# Patient Record
Sex: Female | Born: 1945
Health system: Southern US, Community
[De-identification: ages and names within clinical notes are randomized; demographics above are authoritative.]

## PROBLEM LIST (undated history)

## (undated) DIAGNOSIS — D649 Anemia, unspecified: Secondary | ICD-10-CM

## (undated) DIAGNOSIS — I700 Atherosclerosis of aorta: Secondary | ICD-10-CM

## (undated) DIAGNOSIS — C801 Malignant (primary) neoplasm, unspecified: Secondary | ICD-10-CM

## (undated) DIAGNOSIS — E785 Hyperlipidemia, unspecified: Secondary | ICD-10-CM

## (undated) DIAGNOSIS — N183 Chronic kidney disease, stage 3 unspecified: Secondary | ICD-10-CM

## (undated) DIAGNOSIS — K219 Gastro-esophageal reflux disease without esophagitis: Secondary | ICD-10-CM

## (undated) DIAGNOSIS — N200 Calculus of kidney: Secondary | ICD-10-CM

## (undated) DIAGNOSIS — R768 Other specified abnormal immunological findings in serum: Secondary | ICD-10-CM

## (undated) DIAGNOSIS — N2581 Secondary hyperparathyroidism of renal origin: Secondary | ICD-10-CM

## (undated) DIAGNOSIS — T7840XA Allergy, unspecified, initial encounter: Secondary | ICD-10-CM

## (undated) DIAGNOSIS — D696 Thrombocytopenia, unspecified: Secondary | ICD-10-CM

## (undated) DIAGNOSIS — M51369 Other intervertebral disc degeneration, lumbar region without mention of lumbar back pain or lower extremity pain: Secondary | ICD-10-CM

## (undated) DIAGNOSIS — I10 Essential (primary) hypertension: Secondary | ICD-10-CM

## (undated) DIAGNOSIS — Z8719 Personal history of other diseases of the digestive system: Secondary | ICD-10-CM

## (undated) DIAGNOSIS — A048 Other specified bacterial intestinal infections: Secondary | ICD-10-CM

## (undated) DIAGNOSIS — M5136 Other intervertebral disc degeneration, lumbar region: Secondary | ICD-10-CM

## (undated) HISTORY — PX: APPENDECTOMY: SHX54

## (undated) HISTORY — DX: Allergy, unspecified, initial encounter: T78.40XA

## (undated) HISTORY — DX: Calculus of kidney: N20.0

## (undated) HISTORY — PX: ABDOMINAL HYSTERECTOMY: SHX81

---

## 2006-03-04 ENCOUNTER — Ambulatory Visit: Payer: Self-pay | Admitting: Family Medicine

## 2006-03-18 ENCOUNTER — Ambulatory Visit: Payer: Self-pay | Admitting: Family Medicine

## 2006-03-26 ENCOUNTER — Ambulatory Visit: Payer: Self-pay | Admitting: Gastroenterology

## 2006-04-09 HISTORY — PX: BREAST BIOPSY: SHX20

## 2006-08-09 ENCOUNTER — Ambulatory Visit: Payer: Self-pay | Admitting: General Surgery

## 2007-02-10 ENCOUNTER — Ambulatory Visit: Payer: Self-pay | Admitting: General Surgery

## 2008-05-04 ENCOUNTER — Ambulatory Visit: Payer: Self-pay | Admitting: Family Medicine

## 2009-05-10 ENCOUNTER — Ambulatory Visit: Payer: Self-pay | Admitting: Family Medicine

## 2010-02-15 ENCOUNTER — Ambulatory Visit: Payer: Self-pay | Admitting: Gastroenterology

## 2010-07-06 LAB — HM DEXA SCAN

## 2010-07-26 ENCOUNTER — Ambulatory Visit: Payer: Self-pay | Admitting: Family Medicine

## 2011-08-29 ENCOUNTER — Ambulatory Visit: Payer: Self-pay | Admitting: Family Medicine

## 2011-11-06 LAB — HM COLONOSCOPY

## 2011-12-24 ENCOUNTER — Emergency Department: Payer: Self-pay | Admitting: Emergency Medicine

## 2011-12-24 LAB — URINALYSIS, COMPLETE
Bacteria: NONE SEEN
Bilirubin,UR: NEGATIVE
Blood: NEGATIVE
Nitrite: NEGATIVE
Ph: 5 (ref 4.5–8.0)
Protein: NEGATIVE
Specific Gravity: 1.013 (ref 1.003–1.030)
WBC UR: <1 /HPF (ref 0–5)

## 2011-12-24 LAB — COMPREHENSIVE METABOLIC PANEL
Albumin: 3.9 g/dL (ref 3.4–5.0)
Alkaline Phosphatase: 51 U/L (ref 50–136)
BUN: 18 mg/dL (ref 7–18)
Bilirubin,Total: 0.4 mg/dL (ref 0.2–1.0)
Creatinine: 1.35 mg/dL — ABNORMAL HIGH (ref 0.60–1.30)
Glucose: 102 mg/dL — ABNORMAL HIGH (ref 65–99)
Osmolality: 278 (ref 275–301)
Potassium: 4 mmol/L (ref 3.5–5.1)
Sodium: 138 mmol/L (ref 136–145)
Total Protein: 8.6 g/dL — ABNORMAL HIGH (ref 6.4–8.2)

## 2011-12-24 LAB — CBC
HCT: 39.5 % (ref 35.0–47.0)
HGB: 13.2 g/dL (ref 12.0–16.0)
MCV: 95 fL (ref 80–100)
Platelet: 169 10*3/uL (ref 150–440)
RBC: 4.16 10*6/uL (ref 3.80–5.20)
WBC: 7.9 10*3/uL (ref 3.6–11.0)

## 2011-12-24 LAB — LIPASE, BLOOD: Lipase: 226 U/L (ref 73–393)

## 2012-07-14 LAB — HM PAP SMEAR: HM PAP: NORMAL

## 2012-09-25 ENCOUNTER — Ambulatory Visit: Payer: Self-pay | Admitting: Family Medicine

## 2013-11-06 ENCOUNTER — Ambulatory Visit: Payer: Self-pay | Admitting: Family Medicine

## 2013-12-18 ENCOUNTER — Ambulatory Visit: Payer: Self-pay | Admitting: Family Medicine

## 2014-11-15 ENCOUNTER — Ambulatory Visit: Payer: Self-pay | Admitting: Family Medicine

## 2014-11-15 LAB — HM MAMMOGRAPHY: HM Mammogram: NORMAL

## 2014-12-28 LAB — HEMOGLOBIN A1C: Hgb A1c MFr Bld: 6.2 % — AB (ref 4.0–6.0)

## 2014-12-28 LAB — LIPID PANEL
Cholesterol: 226 mg/dL — AB (ref 0–200)
HDL: 53 mg/dL (ref 35–70)
LDL CALC: 145 mg/dL
Triglycerides: 138 mg/dL (ref 40–160)

## 2015-01-14 DIAGNOSIS — M7540 Impingement syndrome of unspecified shoulder: Secondary | ICD-10-CM | POA: Insufficient documentation

## 2015-04-08 ENCOUNTER — Encounter: Payer: Self-pay | Admitting: Emergency Medicine

## 2015-04-08 DIAGNOSIS — M542 Cervicalgia: Secondary | ICD-10-CM | POA: Diagnosis not present

## 2015-04-08 DIAGNOSIS — Z79899 Other long term (current) drug therapy: Secondary | ICD-10-CM | POA: Insufficient documentation

## 2015-04-08 DIAGNOSIS — M545 Low back pain: Secondary | ICD-10-CM | POA: Diagnosis not present

## 2015-04-08 DIAGNOSIS — K5732 Diverticulitis of large intestine without perforation or abscess without bleeding: Secondary | ICD-10-CM | POA: Insufficient documentation

## 2015-04-08 DIAGNOSIS — R1030 Lower abdominal pain, unspecified: Secondary | ICD-10-CM | POA: Diagnosis present

## 2015-04-08 DIAGNOSIS — I10 Essential (primary) hypertension: Secondary | ICD-10-CM | POA: Diagnosis not present

## 2015-04-08 DIAGNOSIS — Z7982 Long term (current) use of aspirin: Secondary | ICD-10-CM | POA: Diagnosis not present

## 2015-04-08 LAB — CBC WITH DIFFERENTIAL/PLATELET
BASOS ABS: 0 10*3/uL (ref 0–0.1)
Basophils Relative: 0 %
EOS PCT: 1 %
Eosinophils Absolute: 0.1 10*3/uL (ref 0–0.7)
HCT: 36 % (ref 35.0–47.0)
Hemoglobin: 11.9 g/dL — ABNORMAL LOW (ref 12.0–16.0)
LYMPHS ABS: 2.8 10*3/uL (ref 1.0–3.6)
Lymphocytes Relative: 37 %
MCH: 31.7 pg (ref 26.0–34.0)
MCHC: 33.2 g/dL (ref 32.0–36.0)
MCV: 95.6 fL (ref 80.0–100.0)
MONO ABS: 0.5 10*3/uL (ref 0.2–0.9)
MONOS PCT: 7 %
NEUTROS ABS: 4.1 10*3/uL (ref 1.4–6.5)
Neutrophils Relative %: 55 %
Platelets: 140 10*3/uL — ABNORMAL LOW (ref 150–440)
RBC: 3.76 MIL/uL — ABNORMAL LOW (ref 3.80–5.20)
RDW: 14.2 % (ref 11.5–14.5)
WBC: 7.5 10*3/uL (ref 3.6–11.0)

## 2015-04-08 LAB — COMPREHENSIVE METABOLIC PANEL
ALK PHOS: 56 U/L (ref 38–126)
ALT: 18 U/L (ref 14–54)
ANION GAP: 7 (ref 5–15)
AST: 24 U/L (ref 15–41)
Albumin: 3.8 g/dL (ref 3.5–5.0)
BILIRUBIN TOTAL: 0.5 mg/dL (ref 0.3–1.2)
BUN: 17 mg/dL (ref 6–20)
CO2: 30 mmol/L (ref 22–32)
Calcium: 9.7 mg/dL (ref 8.9–10.3)
Chloride: 102 mmol/L (ref 101–111)
Creatinine, Ser: 1.39 mg/dL — ABNORMAL HIGH (ref 0.44–1.00)
GFR, EST AFRICAN AMERICAN: 44 mL/min — AB (ref >60)
GFR, EST NON AFRICAN AMERICAN: 38 mL/min — AB (ref >60)
Glucose, Bld: 181 mg/dL — ABNORMAL HIGH (ref 65–99)
POTASSIUM: 3.5 mmol/L (ref 3.5–5.1)
SODIUM: 139 mmol/L (ref 135–145)
Total Protein: 7.5 g/dL (ref 6.5–8.1)

## 2015-04-08 LAB — URINALYSIS COMPLETE WITH MICROSCOPIC (ARMC ONLY)
Bilirubin Urine: NEGATIVE
Glucose, UA: NEGATIVE mg/dL
Hgb urine dipstick: NEGATIVE
Ketones, ur: NEGATIVE mg/dL
Nitrite: NEGATIVE
PH: 6 (ref 5.0–8.0)
Protein, ur: NEGATIVE mg/dL
SPECIFIC GRAVITY, URINE: 1.008 (ref 1.005–1.030)

## 2015-04-08 NOTE — ED Notes (Signed)
Pt presents to ED with c/o pain to groin (L > R) which wraps into low back for about a week and a half. Pt also reports neck soreness which began today. Pt denies painful urination or urinary frequency. Denies nausea/vomiting/diarrhea. Pt states, "its an ache and a soreness" groin, low back and neck. Pt is awake and alert during triage.

## 2015-04-09 ENCOUNTER — Emergency Department: Payer: Medicare Other

## 2015-04-09 ENCOUNTER — Emergency Department
Admission: EM | Admit: 2015-04-09 | Discharge: 2015-04-09 | Disposition: A | Discharge status: Home / Self Care | Payer: Medicare Other | Attending: Emergency Medicine | Admitting: Emergency Medicine

## 2015-04-09 DIAGNOSIS — K5732 Diverticulitis of large intestine without perforation or abscess without bleeding: Secondary | ICD-10-CM | POA: Diagnosis not present

## 2015-04-09 HISTORY — DX: Essential (primary) hypertension: I10

## 2015-04-09 HISTORY — DX: Hyperlipidemia, unspecified: E78.5

## 2015-04-09 MED ORDER — IOHEXOL 300 MG/ML  SOLN
100.0000 mL | Freq: Once | INTRAMUSCULAR | Status: AC | PRN
  Administered 2015-04-09: 100 mL via INTRAVENOUS

## 2015-04-09 MED ORDER — IOHEXOL 240 MG/ML SOLN
25.0000 mL | Freq: Once | INTRAMUSCULAR | Status: AC | PRN
  Administered 2015-04-09: 25 mL via ORAL

## 2015-04-09 NOTE — ED Provider Notes (Signed)
[facility] Emergency Department Provider Note  ____________________________________________  Time seen: 12:30 AM  I have reviewed the triage vital signs and the nursing notes.   HISTORY  Chief Complaint Groin Pain; Back Pain; and Neck Pain    HPI Morgan Livingston is a 68 y.o. female who complains of bilateral lower abdominal pain for the last 10 days. She reports a history of diverticulitis and thinks this may be similar but is not sure. She is having daily bowel movements and has no vomiting. She is eating and taking normally. Only surgical history is partial hysterectomy with preserved ovaries and appendix.     Past Medical History  Diagnosis Date  . Hypertension   . Hyperlipidemia     There are no active problems to display for this patient.   Past Surgical History  Procedure Laterality Date  . Abdominal hysterectomy      Current Outpatient Rx  Name  Route  Sig  Dispense  Refill  . aspirin 81 MG tablet   Oral   Take 81 mg by mouth daily.         . fexofenadine (ALLEGRA) 180 MG tablet   Oral   Take 180 mg by mouth daily.         . rosuvastatin (CRESTOR) 40 MG tablet   Oral   Take 40 mg by mouth daily.         . valsartan-hydrochlorothiazide (DIOVAN-HCT) 160-12.5 MG per tablet   Oral   Take 1 tablet by mouth daily.           Allergies Review of patient's allergies indicates not on file.  No family history on file.  Social History History  Substance Use Topics  . Smoking status: Never Smoker   . Smokeless tobacco: Not on file  . Alcohol Use: No    Review of Systems  Constitutional: No fever or chills. No weight changes Eyes:No blurry vision or double vision.  ENT: No sore throat. Cardiovascular: No chest pain. Respiratory: No dyspnea or cough. Gastrointestinal: Lower abdominal pain without vomiting or diarrhea..  No BRBPR or melena. Genitourinary: Negative for dysuria, urinary retention, bloody urine, or  difficulty urinating. Musculoskeletal: Negative for back pain. No joint swelling or pain. Skin: Negative for rash. Neurological: Negative for headaches, focal weakness or numbness. Psychiatric:No anxiety or depression.   Endocrine:No hot/cold intolerance, changes in energy, or sleep difficulty.  10-point ROS otherwise negative.  ____________________________________________   PHYSICAL EXAM:  VITAL SIGNS: ED Triage Vitals  Enc Vitals Group     BP 04/08/15 2236 142/86 mmHg     Pulse Rate 04/08/15 2236 76     Resp 04/08/15 2236 18     Temp 04/08/15 2236 98.4 F (36.9 C)     Temp Source 04/08/15 2236 Oral     SpO2 04/08/15 2236 100 %     Weight 04/08/15 2236 175 lb (79.379 kg)     Height 04/08/15 2236 5\' 5"  (1.651 m)     Head Cir --      Peak Flow --      Pain Score 04/08/15 2236 7     Pain Loc --      Pain Edu? --      Excl. in [place]? --      Constitutional: Alert and oriented. Well appearing and in no distress. Eyes: No scleral icterus. No conjunctival pallor. PERRL. EOMI ENT   Head: Normocephalic and atraumatic.   Nose: No congestion/rhinnorhea. No septal hematoma   Mouth/Throat:  MMM, no pharyngeal erythema. No peritonsillar mass. No uvula shift.   Neck: No stridor. No SubQ emphysema. No meningismus. Hematological/Lymphatic/Immunilogical: No cervical lymphadenopathy. Cardiovascular: RRR. Normal and symmetric distal pulses are present in all extremities. No murmurs, rubs, or gallops. Respiratory: Normal respiratory effort without tachypnea nor retractions. Breath sounds are clear and equal bilaterally. No wheezes/rales/rhonchi. Gastrointestinal: Moderate right lower quadrant tenderness, severe left lower quadrant tenderness.. No distention. There is no CVA tenderness.  No rebound, rigidity, or guarding. Genitourinary: deferred Musculoskeletal: Nontender with normal range of motion in all extremities. No joint effusions.  No lower extremity tenderness.  No  edema. Neurologic:   Normal speech and language.  CN 2-10 normal. Motor grossly intact. No pronator drift.  Normal gait. No gross focal neurologic deficits are appreciated.  Skin:  Skin is warm, dry and intact. No rash noted.  No petechiae, purpura, or bullae. Psychiatric: Mood and affect are normal. Speech and behavior are normal. Patient exhibits appropriate insight and judgment.  ____________________________________________    LABS (pertinent positives/negatives) (all labs ordered are listed, but only abnormal results are displayed) Labs Reviewed  CBC WITH DIFFERENTIAL/PLATELET - Abnormal; Notable for the following:    RBC 3.76 (*)    Hemoglobin 11.9 (*)    Platelets 140 (*)    All other components within normal limits  COMPREHENSIVE METABOLIC PANEL - Abnormal; Notable for the following:    Glucose, Bld 181 (*)    Creatinine, Ser 1.39 (*)    GFR calc non Af Amer 38 (*)    GFR calc Af Amer 44 (*)    All other components within normal limits  URINALYSIS COMPLETEWITH MICROSCOPIC ([facility] ONLY) - Abnormal; Notable for the following:    Color, Urine YELLOW (*)    APPearance HAZY (*)    Leukocytes, UA 1+ (*)    Bacteria, UA RARE (*)    Squamous Epithelial / LPF 6-30 (*)    All other components within normal limits   ____________________________________________   EKG    ____________________________________________    RADIOLOGY  CT abdomen and pelvis unremarkable. Evidence of diverticulosis without diverticulitis.  ____________________________________________   PROCEDURES  ____________________________________________   INITIAL IMPRESSION / ASSESSMENT AND PLAN / ED COURSE  Pertinent labs & imaging results that were available during my care of the patient were reviewed by me and considered in my medical decision making (see chart for details).  Presentation concerning for appendicitis versus diverticulitis. The patient is nontoxic appearing. We'll proceed with a  CT abdomen and pelvis to further evaluate the source of her pain. IV fluids and Zofran and Dilaudid as needed. ----------------------------------------- 3:38 AM on 04/09/2015 -----------------------------------------  Workup unremarkable. Reexam shows abdomen is soft with mild left lower quadrant tenderness but no rebound rigidity or guarding. Clinically her presentation is consistent with diverticulitis but there is no radiographic evidence on CT. I'll signs remain unremarkable and the patient is nontoxic and well-appearing overall. She is calm and comfortable. Consistent with most updated guidelines on the treatment of diverticulitis will not start her on antibiotics given the mild course at this time. We'll have her follow up with primary care for further monitoring of her symptoms ____________________________________________   FINAL CLINICAL IMPRESSION(S) / ED DIAGNOSES  Final diagnoses:  Diverticulitis of large intestine without perforation or abscess without bleeding      Carrie Mew, MD 04/09/15 [phone]

## 2015-04-09 NOTE — Discharge Instructions (Signed)

## 2015-04-09 NOTE — ED Notes (Signed)
Patient transported to CT 

## 2015-05-22 ENCOUNTER — Encounter: Payer: Self-pay | Admitting: Family Medicine

## 2015-05-22 DIAGNOSIS — J3089 Other allergic rhinitis: Secondary | ICD-10-CM

## 2015-05-22 DIAGNOSIS — M5136 Other intervertebral disc degeneration, lumbar region: Secondary | ICD-10-CM | POA: Insufficient documentation

## 2015-05-22 DIAGNOSIS — I10 Essential (primary) hypertension: Secondary | ICD-10-CM | POA: Insufficient documentation

## 2015-05-22 DIAGNOSIS — E785 Hyperlipidemia, unspecified: Secondary | ICD-10-CM | POA: Insufficient documentation

## 2015-05-22 DIAGNOSIS — K5909 Other constipation: Secondary | ICD-10-CM | POA: Insufficient documentation

## 2015-05-22 DIAGNOSIS — Z8541 Personal history of malignant neoplasm of cervix uteri: Secondary | ICD-10-CM | POA: Insufficient documentation

## 2015-05-22 DIAGNOSIS — R739 Hyperglycemia, unspecified: Secondary | ICD-10-CM | POA: Insufficient documentation

## 2015-05-22 DIAGNOSIS — K5790 Diverticulosis of intestine, part unspecified, without perforation or abscess without bleeding: Secondary | ICD-10-CM | POA: Insufficient documentation

## 2015-05-22 DIAGNOSIS — E559 Vitamin D deficiency, unspecified: Secondary | ICD-10-CM | POA: Insufficient documentation

## 2015-05-22 DIAGNOSIS — A048 Other specified bacterial intestinal infections: Secondary | ICD-10-CM | POA: Insufficient documentation

## 2015-05-22 DIAGNOSIS — J302 Other seasonal allergic rhinitis: Secondary | ICD-10-CM | POA: Insufficient documentation

## 2015-05-22 DIAGNOSIS — E663 Overweight: Secondary | ICD-10-CM | POA: Insufficient documentation

## 2015-05-23 ENCOUNTER — Encounter: Payer: Self-pay | Admitting: Family Medicine

## 2015-05-23 ENCOUNTER — Encounter (INDEPENDENT_AMBULATORY_CARE_PROVIDER_SITE_OTHER): Payer: Self-pay

## 2015-05-23 ENCOUNTER — Ambulatory Visit (INDEPENDENT_AMBULATORY_CARE_PROVIDER_SITE_OTHER): Payer: Medicare Other | Admitting: Family Medicine

## 2015-05-23 VITALS — BP 118/84 | HR 89 | Temp 98.1°F | Resp 16 | Ht 65.0 in | Wt 178.0 lb

## 2015-05-23 DIAGNOSIS — K219 Gastro-esophageal reflux disease without esophagitis: Secondary | ICD-10-CM | POA: Diagnosis not present

## 2015-05-23 DIAGNOSIS — N183 Chronic kidney disease, stage 3 unspecified: Secondary | ICD-10-CM | POA: Insufficient documentation

## 2015-05-23 DIAGNOSIS — I10 Essential (primary) hypertension: Secondary | ICD-10-CM

## 2015-05-23 DIAGNOSIS — E785 Hyperlipidemia, unspecified: Secondary | ICD-10-CM | POA: Diagnosis not present

## 2015-05-23 DIAGNOSIS — Z90710 Acquired absence of both cervix and uterus: Secondary | ICD-10-CM | POA: Diagnosis not present

## 2015-05-23 DIAGNOSIS — E559 Vitamin D deficiency, unspecified: Secondary | ICD-10-CM | POA: Diagnosis not present

## 2015-05-23 DIAGNOSIS — N1832 Chronic kidney disease, stage 3b: Secondary | ICD-10-CM | POA: Insufficient documentation

## 2015-05-23 DIAGNOSIS — D649 Anemia, unspecified: Secondary | ICD-10-CM

## 2015-05-23 DIAGNOSIS — R739 Hyperglycemia, unspecified: Secondary | ICD-10-CM

## 2015-05-23 MED ORDER — VALSARTAN-HYDROCHLOROTHIAZIDE 160-12.5 MG PO TABS: 1.0000 | ORAL_TABLET | Freq: Every day | ORAL | Status: DC

## 2015-05-23 MED ORDER — OMEPRAZOLE 20 MG PO CPDR: 20.0000 mg | DELAYED_RELEASE_CAPSULE | Freq: Every day | ORAL | Status: DC

## 2015-05-23 MED ORDER — ROSUVASTATIN CALCIUM 40 MG PO TABS: 40.0000 mg | ORAL_TABLET | Freq: Every day | ORAL | Status: DC

## 2015-05-23 NOTE — Progress Notes (Signed)
Name: Morgan Livingston   MRN: [id number]    DOB: 21-Apr-1946   Date:05/23/2015       Progress Note  Subjective  Chief Complaint  Chief Complaint  Patient presents with  . Hypertension  . Hyperlipidemia  . Gastrophageal Reflux    onset 2 weeks indegestion, pt states if eats or drinks something it goes away    HPI  HTN: she is compliant with medication, bp at home is 120's/80's. She denies chest pain, SOB, no palpitation.  She denies hypotension.   Hyperlipidemia: taking Crestor daily, she is taking 48m daily, denies side effects.  GERD: she has a history of h. Pylori but denies severe indigestion at this time, going a long time without eating and develops epigastric pain and sometimes radiates to her back, feels like a bubble that does not get out.  She states symptoms improve with Tums .  Symptoms have been happening 3 days in the past couple of weeks and one time it was severe and associated with nausea. She denies diaphoresis or chest pain. [facility] T[facility] lasts more than 15 minutes or become more frequent because it could be heart disease.   CKI stage III: on ARB, bp is at goal, taking aspirin and statin.   Patient Active Problem List   Diagnosis Date Noted  . History of hysterectomy 05/23/2015  . Chronic kidney disease, stage III (moderate) 05/23/2015  . Indigestion 05/23/2015  . Benign hypertension 05/22/2015  . Chronic constipation 05/22/2015  . DDD (degenerative disc disease), lumbar 05/22/2015  . DD (diverticular disease) 05/22/2015  . Dyslipidemia 05/22/2015  . History of cervical cancer 05/22/2015  . Helicobacter pylori gastrointestinal tract infection 05/22/2015  . Blood glucose elevated 05/22/2015  . Overweight (BMI 25.0-29.9) 05/22/2015  . Perennial allergic rhinitis with seasonal variation 05/22/2015  . Vitamin D deficiency 05/22/2015  . Impingement syndrome of shoulder 01/14/2015    Past Surgical History  Procedure  Laterality Date  . Abdominal hysterectomy      History reviewed. No pertinent family history.  History   Social History  . Marital Status: Married    Spouse Name: N/A  . Number of Children: N/A  . Years of Education: N/A   Occupational History  . Not on file.   Social History Main Topics  . Smoking status: Never Smoker   . Smokeless tobacco: Never Used  . Alcohol Use: No  . Drug Use: Not on file  . Sexual Activity: Yes   Other Topics Concern  . Not on file   Social History Narrative     Current outpatient prescriptions:  .  aspirin 81 MG tablet, Take 81 mg by mouth daily., Disp: , Rfl:  .  cholecalciferol (VITAMIN D) 1000 UNITS tablet, Take by mouth., Disp: , Rfl:  .  fexofenadine (ALLEGRA) 180 MG tablet, Take 180 mg by mouth daily., Disp: , Rfl:  .  rosuvastatin (CRESTOR) 40 MG tablet, Take 1 tablet (40 mg total) by mouth daily., Disp: 90 tablet, Rfl: 1 .  valsartan-hydrochlorothiazide (DIOVAN-HCT) 160-12.5 MG per tablet, Take 1 tablet by mouth daily., Disp: 90 tablet, Rfl: 1  No Known Allergies   ROS  Constitutional: Negative for fever or weight change.  Respiratory: Negative for cough and shortness of breath.   Cardiovascular: Negative for chest pain or palpitations.  Gastrointestinal: Epigastric abdominal pain, no bowel changes. [facility] but is doing well now Musculoskeletal: Negative for  gait problem or joint swelling. Right shoulder pain has improved Skin: Negative for rash.  Neurological: Negative for dizziness or headache.  No other specific complaints in a complete review of systems (except as listed in HPI above).  Objective  Filed Vitals:   05/23/15 0814  BP: 118/84  Pulse: 89  Temp: 98.1 F (36.7 C)  TempSrc: Oral  Resp: 16  Height: 5' 5" (1.651 m)  Weight: 178 lb (80.74 kg)  SpO2: 95%    Body mass index is 29.62 kg/(m^2).  Physical Exam  Constitutional: Patient appears well-developed and  well-nourished. No distress.  HENT: Head: Normocephalic and atraumatic. Ears: B TMs ok, no erythema or effusion; Nose: Nose normal. Mouth/Throat: Oropharynx is clear and moist. No oropharyngeal exudate.  Eyes: Conjunctivae and EOM are normal. Pupils are equal, round, and reactive to light. No scleral icterus.  Neck: Normal range of motion. Neck supple. No JVD present. No thyromegaly present.  Cardiovascular: Normal rate, regular rhythm and normal heart sounds.  No murmur heard. No BLE edema. Pulmonary/Chest: Effort normal and breath sounds normal. No respiratory distress. Abdominal: Soft. Bowel sounds are normal, no distension. There is no tenderness. no masses Musculoskeletal: Normal range of motion or right shoulder and no pain,  no joint effusions. No gross deformities  Neurological: he is alert and oriented to person, place, and time. No cranial nerve deficit. Coordination, balance, strength, speech and gait are normal.  Skin: Skin is warm and dry. No rash noted. No erythema.  Psychiatric: Patient has a normal mood and affect. behavior is normal. Judgment and thought content normal.  Recent Results (from the past 2160 hour(s))  CBC with Differential     Status: Abnormal   Collection Time: 04/08/15 10:49 PM  Result Value Ref Range   WBC 7.5 3.6 - 11.0 K/uL   RBC 3.76 (L) 3.80 - 5.20 MIL/uL   Hemoglobin 11.9 (L) 12.0 - 16.0 g/dL   HCT 36.0 35.0 - 47.0 %   MCV 95.6 80.0 - 100.0 fL   MCH 31.7 26.0 - 34.0 pg   MCHC 33.2 32.0 - 36.0 g/dL   RDW 14.2 11.5 - 14.5 %   Platelets 140 (L) 150 - 440 K/uL   Neutrophils Relative % 55 %   Neutro Abs 4.1 1.4 - 6.5 K/uL   Lymphocytes Relative 37 %   Lymphs Abs 2.8 1.0 - 3.6 K/uL   Monocytes Relative 7 %   Monocytes Absolute 0.5 0.2 - 0.9 K/uL   Eosinophils Relative 1 %   Eosinophils Absolute 0.1 0 - 0.7 K/uL   Basophils Relative 0 %   Basophils Absolute 0.0 0 - 0.1 K/uL  Comprehensive metabolic panel     Status: Abnormal   Collection Time:  04/08/15 10:49 PM  Result Value Ref Range   Sodium 139 135 - 145 mmol/L   Potassium 3.5 3.5 - 5.1 mmol/L   Chloride 102 101 - 111 mmol/L   CO2 30 22 - 32 mmol/L   Glucose, Bld 181 (H) 65 - 99 mg/dL   BUN 17 6 - 20 mg/dL   Creatinine, Ser 1.39 (H) 0.44 - 1.00 mg/dL   Calcium 9.7 8.9 - 10.3 mg/dL   Total Protein 7.5 6.5 - 8.1 g/dL   Albumin 3.8 3.5 - 5.0 g/dL   AST 24 15 - 41 U/L   ALT 18 14 - 54 U/L   Alkaline Phosphatase 56 38 - 126 U/L   Total Bilirubin 0.5 0.3 - 1.2 mg/dL   GFR calc non  Af Amer 38 (L) >60 mL/min   GFR calc Af Amer 44 (L) >60 mL/min    Comment: (NOTE) The eGFR has been calculated using the CKD EPI equation. This calculation has not been validated in all clinical situations. eGFR's persistently <60 mL/min signify possible Chronic Kidney Disease.    Anion gap 7 5 - 15  Urinalysis complete, with microscopic [facility])     Status: Abnormal   Collection Time: 04/08/15 10:49 PM  Result Value Ref Range   Color, Urine YELLOW (A) YELLOW   APPearance HAZY (A) CLEAR   Glucose, UA NEGATIVE NEGATIVE mg/dL   Bilirubin Urine NEGATIVE NEGATIVE   Ketones, ur NEGATIVE NEGATIVE mg/dL   Specific Gravity, Urine 1.008 1.005 - 1.030   Hgb urine dipstick NEGATIVE NEGATIVE   pH 6.0 5.0 - 8.0   Protein, ur NEGATIVE NEGATIVE mg/dL   Nitrite NEGATIVE NEGATIVE   Leukocytes, UA 1+ (A) NEGATIVE   RBC / HPF 0-5 0 - 5 RBC/hpf   WBC, UA 6-30 0 - 5 WBC/hpf   Bacteria, UA RARE (A) NONE SEEN   Squamous Epithelial / LPF 6-30 (A) NONE SEEN   Mucous PRESENT      PHQ2/9: Depression screen [facility] 2/9 05/23/2015  Decreased Interest 0  Down, Depressed, Hopeless 0  PHQ - 2 Score 0     Fall Risk: Fall Risk  05/23/2015  Falls in the past year? No      Assessment & Plan  1. Benign hypertension At goal, continue medication  - Comprehensive Metabolic Panel (CMET) - valsartan-hydrochlorothiazide (DIOVAN-HCT) 160-12.5 MG per tablet; Take 1 tablet by mouth daily.  Dispense: 90 tablet; Refill:  1  2. Dyslipidemia Recheck labs - Lipid Profile - rosuvastatin (CRESTOR) 40 MG tablet; Take 1 tablet (40 mg total) by mouth daily.  Dispense: 90 tablet; Refill: 1  3. Gastroesophageal reflux disease without esophagitis It may be the cause of indigestion but also discussed symptoms of heart attacks and needs to go to [facility] - omeprazole (PRILOSEC) 20 MG capsule; Take 1 capsule (20 mg total) by mouth daily.  Dispense: 30 capsule; Refill: 3  4. History of hysterectomy Last pap within normal limits   5. Vitamin D deficiency  - Vitamin D (25 hydroxy)  6. Chronic kidney disease, stage III (moderate)  - Vitamin D (25 hydroxy)  7. Hyperglycemia  - HgB A1c  8. Anemia, unspecified anemia type Found during EC visit for diverticulitis - CBC with Differential - Iron - Iron Binding Cap (TIBC) - Ferritin

## 2015-05-23 NOTE — Patient Instructions (Signed)
Indigestion °Indigestion is discomfort in the upper abdomen that is caused by underlying problems such as gastroesophageal reflux disease (GERD), ulcers, or gallbladder problems.  °CAUSES  °Indigestion can be caused by many things. Possible causes include: °· Stomach acid in the esophagus. °· Stomach infections, usually caused by the bacteria H. pylori. °· Being overweight. °· Hiatal hernia. This means part of the stomach pushes up through the diaphragm. °· Overeating. °· Emotional problems, such as stress, anxiety, or depression. °· Poor nutrition. °· Consuming too much alcohol, tobacco, or caffeine. °· Consuming spicy foods, fats, peppermint, chocolate, tomato products, citrus, or fruit juices. °· Medicines such as aspirin and other anti-inflammatory drugs, hormones, steroids, and thyroid medicines. °· Gastroparesis. This is a condition in which the stomach does not empty properly. °· Stomach cancer. °· Pregnancy, due to an increase in hormone levels, a relaxation of muscles in the digestive tract, and pressure on the stomach from the growing fetus. °SYMPTOMS  °· Uncomfortable feeling of fullness after eating. °· Pain or burning sensation in the upper abdomen. °· Bloating. °· Belching and gas. °· Nausea and vomiting. °· Acidic taste in the mouth. °· Burning sensation in the chest (heartburn). °DIAGNOSIS  °Your caregiver will review your medical history and perform a physical exam. Other tests, such as blood tests, stool tests, X-rays, and other imaging scans, may be done to check for more serious problems. °TREATMENT  °Liquid antacids and other drugs may be given to block stomach acid secretion. Medicines that increase esophageal muscle tone may also be given to help reduce symptoms. If an infection is found, antibiotic medicine may be given. °HOME CARE INSTRUCTIONS °· Avoid foods and drinks that make your symptoms worse, such as: °¨ Caffeine or alcoholic drinks. °¨ Chocolate. °¨ Peppermint or mint  flavorings. °¨ Garlic and onions. °¨ Spicy foods. °¨ Citrus fruits, such as oranges, lemons, or limes. °¨ Tomato-based foods such as sauce, chili, salsa, and pizza. °¨ Fried and fatty foods. °· Avoid eating for the 3 hours prior to your bedtime. °· Eat small, frequent meals instead of large meals. °· Stop smoking if you smoke. °· Maintain a healthy weight. °· Wear loose-fitting clothing. Do not wear anything tight around your waist that causes pressure on your stomach. °· Raise the head of your bed 4 to 8 inches with wood blocks to help you sleep. Extra pillows will not help. °· Only take over-the-counter or prescription medicines as directed by your caregiver. °· Do not take aspirin, ibuprofen, or other nonsteroidal anti-inflammatory drugs (NSAIDs). °SEEK IMMEDIATE MEDICAL CARE IF:  °· You are not better after 2 days. °· You have chest pressure or pain that radiates up into your neck, arms, back, jaw, or upper abdomen. °· You have difficulty swallowing. °· You keep vomiting. °· You have black or bloody stools. °· You have a fever. °· You have dizziness, fainting, difficulty breathing, or heavy sweating. °· You have severe abdominal pain. °· You lose weight without trying. °MAKE SURE YOU: °· Understand these instructions. °· Will watch your condition. °· Will get help right away if you are not doing well or get worse. °Document Released: 11/29/2004 Document Revised: 01/14/2012 Document Reviewed: 06/06/2011 °[employer]® Patient Information ©2015 [employer]. This information is not intended to replace advice given to you by your health care provider. Make sure you discuss any questions you have with your health care provider. ° °

## 2015-05-24 LAB — CBC WITH DIFFERENTIAL/PLATELET
Basophils Absolute: 0 10*3/uL (ref 0.0–0.2)
Basos: 0 %
EOS (ABSOLUTE): 0.1 10*3/uL (ref 0.0–0.4)
EOS: 1 %
HEMOGLOBIN: 12 g/dL (ref 11.1–15.9)
Hematocrit: 34.8 % (ref 34.0–46.6)
IMMATURE GRANS (ABS): 0 10*3/uL (ref 0.0–0.1)
Immature Granulocytes: 0 %
LYMPHS ABS: 2.7 10*3/uL (ref 0.7–3.1)
LYMPHS: 41 %
MCH: 32 pg (ref 26.6–33.0)
MCHC: 34.5 g/dL (ref 31.5–35.7)
MCV: 93 fL (ref 79–97)
Monocytes Absolute: 0.7 10*3/uL (ref 0.1–0.9)
Monocytes: 11 %
NEUTROS PCT: 47 %
Neutrophils Absolute: 3.1 10*3/uL (ref 1.4–7.0)
PLATELETS: 166 10*3/uL (ref 150–379)
RBC: 3.75 x10E6/uL — ABNORMAL LOW (ref 3.77–5.28)
RDW: 14.7 % (ref 12.3–15.4)
WBC: 6.6 10*3/uL (ref 3.4–10.8)

## 2015-05-24 LAB — COMPREHENSIVE METABOLIC PANEL
A/G RATIO: 1.4 (ref 1.1–2.5)
ALT: 16 IU/L (ref 0–32)
AST: 24 IU/L (ref 0–40)
Albumin: 4 g/dL (ref 3.6–4.8)
Alkaline Phosphatase: 59 IU/L (ref 39–117)
BUN/Creatinine Ratio: 9 — ABNORMAL LOW (ref 11–26)
BUN: 11 mg/dL (ref 8–27)
Bilirubin Total: 0.3 mg/dL (ref 0.0–1.2)
CO2: 26 mmol/L (ref 18–29)
CREATININE: 1.29 mg/dL — AB (ref 0.57–1.00)
Calcium: 9.7 mg/dL (ref 8.7–10.3)
Chloride: 101 mmol/L (ref 97–108)
GFR, EST AFRICAN AMERICAN: 50 mL/min/{1.73_m2} — AB (ref >59)
GFR, EST NON AFRICAN AMERICAN: 43 mL/min/{1.73_m2} — AB (ref >59)
GLUCOSE: 101 mg/dL — AB (ref 65–99)
Globulin, Total: 2.9 g/dL (ref 1.5–4.5)
Potassium: 4.3 mmol/L (ref 3.5–5.2)
Sodium: 141 mmol/L (ref 134–144)
TOTAL PROTEIN: 6.9 g/dL (ref 6.0–8.5)

## 2015-05-24 LAB — HEMOGLOBIN A1C
Est. average glucose Bld gHb Est-mCnc: 134 mg/dL
HEMOGLOBIN A1C: 6.3 % — AB (ref 4.8–5.6)

## 2015-05-24 LAB — LIPID PANEL
Chol/HDL Ratio: 4.3 ratio units (ref 0.0–4.4)
Cholesterol, Total: 212 mg/dL — ABNORMAL HIGH (ref 100–199)
HDL: 49 mg/dL (ref >39)
LDL Calculated: 139 mg/dL — ABNORMAL HIGH (ref 0–99)
Triglycerides: 121 mg/dL (ref 0–149)
VLDL Cholesterol Cal: 24 mg/dL (ref 5–40)

## 2015-05-24 LAB — FERRITIN: FERRITIN: 57 ng/mL (ref 15–150)

## 2015-05-24 LAB — IRON AND TIBC
Iron Saturation: 28 % (ref 15–55)
Iron: 76 ug/dL (ref 27–139)
TIBC: 276 ug/dL (ref 250–450)
UIBC: 200 ug/dL (ref 118–369)

## 2015-05-24 LAB — VITAMIN D 25 HYDROXY (VIT D DEFICIENCY, FRACTURES): VIT D 25 HYDROXY: 37.9 ng/mL (ref 30.0–100.0)

## 2015-06-11 ENCOUNTER — Other Ambulatory Visit: Payer: Self-pay | Admitting: Family Medicine

## 2015-09-23 ENCOUNTER — Encounter: Payer: Self-pay | Admitting: Family Medicine

## 2015-09-23 ENCOUNTER — Ambulatory Visit (INDEPENDENT_AMBULATORY_CARE_PROVIDER_SITE_OTHER): Payer: Medicare Other | Admitting: Family Medicine

## 2015-09-23 VITALS — BP 118/86 | HR 61 | Temp 98.1°F | Resp 16 | Ht 65.0 in | Wt 174.5 lb

## 2015-09-23 DIAGNOSIS — Z1239 Encounter for other screening for malignant neoplasm of breast: Secondary | ICD-10-CM

## 2015-09-23 DIAGNOSIS — J302 Other seasonal allergic rhinitis: Secondary | ICD-10-CM

## 2015-09-23 DIAGNOSIS — Z23 Encounter for immunization: Secondary | ICD-10-CM | POA: Diagnosis not present

## 2015-09-23 DIAGNOSIS — I10 Essential (primary) hypertension: Secondary | ICD-10-CM | POA: Diagnosis not present

## 2015-09-23 DIAGNOSIS — J3089 Other allergic rhinitis: Secondary | ICD-10-CM

## 2015-09-23 DIAGNOSIS — R739 Hyperglycemia, unspecified: Secondary | ICD-10-CM | POA: Diagnosis not present

## 2015-09-23 DIAGNOSIS — J309 Allergic rhinitis, unspecified: Secondary | ICD-10-CM

## 2015-09-23 DIAGNOSIS — N183 Chronic kidney disease, stage 3 unspecified: Secondary | ICD-10-CM

## 2015-09-23 DIAGNOSIS — E785 Hyperlipidemia, unspecified: Secondary | ICD-10-CM

## 2015-09-23 MED ORDER — ROSUVASTATIN CALCIUM 20 MG PO TABS: 20.0000 mg | ORAL_TABLET | Freq: Every day | ORAL | Status: DC

## 2015-09-23 MED ORDER — VALSARTAN-HYDROCHLOROTHIAZIDE 160-12.5 MG PO TABS: 1.0000 | ORAL_TABLET | Freq: Every day | ORAL | Status: DC

## 2015-09-23 NOTE — Progress Notes (Signed)
Name: Morgan Livingston   MRN: [id number]    DOB: 17-Mar-1946   Date:09/23/2015       Progress Note  Subjective  Chief Complaint  Chief Complaint  Patient presents with  . Medication Management    4 month F/U  . Hypertension  . Gastroesophageal Reflux    Well controlled but states the Aspirin 81 mg makes it worst.  . Allergic Rhinitis     Worsen with weather changes, sinus drainage, scratchy throat.   Marland Kitchen Hyperlipidemia    HPI  HTN: Morgan Livingston is here today for her regular follow up, she needs chest pain, SOB or palpitation.   GERD: she is doing well on Omeprazole but states still has some heartburn when she takes aspirin. She tried all forms of aspirin but still causes symptoms, advised to take it with food.   AR: symptoms are worse this time of the year and also during Spring. She has post-nasal drainage, eye pruritus and clear tears.   Hyperlipidemia: taking Crestor 20 mg daily and denies side effects of medication.   CKI: GFR has been stable , advised to avoid nsaid's. Good urine output  Breast tissue under breast: she states it is growing, but no pain .  Patient Active Problem List   Diagnosis Date Noted  . History of hysterectomy 05/23/2015  . Chronic kidney disease, stage III (moderate) 05/23/2015  . Benign hypertension 05/22/2015  . Chronic constipation 05/22/2015  . DDD (degenerative disc disease), lumbar 05/22/2015  . DD (diverticular disease) 05/22/2015  . Dyslipidemia 05/22/2015  . History of cervical cancer 05/22/2015  . Helicobacter pylori gastrointestinal tract infection 05/22/2015  . Blood glucose elevated 05/22/2015  . Overweight (BMI 25.0-29.9) 05/22/2015  . Perennial allergic rhinitis with seasonal variation 05/22/2015  . Vitamin D deficiency 05/22/2015  . Impingement syndrome of shoulder 01/14/2015    Past Surgical History  Procedure Laterality Date  . Abdominal hysterectomy      History reviewed. No pertinent family history.  Social  History   Social History  . Marital Status: Married    Spouse Name: N/A  . Number of Children: N/A  . Years of Education: N/A   Occupational History  . Not on file.   Social History Main Topics  . Smoking status: Never Smoker   . Smokeless tobacco: Never Used  . Alcohol Use: No  . Drug Use: Not on file  . Sexual Activity: Yes   Other Topics Concern  . Not on file   Social History Narrative     Current outpatient prescriptions:  .  aspirin 81 MG tablet, Take 81 mg by mouth daily., Disp: , Rfl:  .  cholecalciferol (VITAMIN D) 1000 UNITS tablet, Take by mouth., Disp: , Rfl:  .  fexofenadine (ALLEGRA) 180 MG tablet, Take 180 mg by mouth daily., Disp: , Rfl:  .  omeprazole (PRILOSEC) 20 MG capsule, Take 1 capsule (20 mg total) by mouth daily., Disp: 30 capsule, Rfl: 3 .  rosuvastatin (CRESTOR) 20 MG tablet, Take 1 tablet (20 mg total) by mouth daily., Disp: 90 tablet, Rfl: 1 .  valsartan-hydrochlorothiazide (DIOVAN-HCT) 160-12.5 MG tablet, Take 1 tablet by mouth daily., Disp: 90 tablet, Rfl: 1  No Known Allergies   ROS  Constitutional: Negative for fever with mild weight change.  Respiratory: Negative for cough and shortness of breath.   Cardiovascular: Negative for chest pain or palpitations.  Gastrointestinal: Negative for abdominal pain, no bowel changes.  Musculoskeletal: Negative for gait problem or joint swelling.  Skin: Negative for rash.  Neurological: Negative for dizziness or headache.  No other specific complaints in a complete review of systems (except as listed in HPI above).  Objective  Filed Vitals:   09/23/15 0811  BP: 118/86  Pulse: 61  Temp: 98.1 F (36.7 C)  TempSrc: Oral  Resp: 16  Height: 5\' 5"  (1.651 m)  Weight: 174 lb 8 oz (79.153 kg)  SpO2: 95%    Body mass index is 29.04 kg/(m^2).  Physical Exam  Constitutional: Patient appears well-developed and well-nourished. Obese  No distress.  HEENT: head atraumatic, normocephalic, pupils  equal and reactive to light, ears TM normal bilaterally neck supple, throat within normal limits Cardiovascular: Normal rate, regular rhythm and normal heart sounds.  No murmur heard. No BLE edema. Pulmonary/Chest: Effort normal and breath sounds normal. No respiratory distress. Abdominal: Soft.  There is no tenderness. Psychiatric: Patient has a normal mood and affect. behavior is normal. Judgment and thought content normal.  PHQ2/9: Depression screen [facility] 2/9 09/23/2015 05/23/2015  Decreased Interest 0 0  Down, Depressed, Hopeless 0 0  PHQ - 2 Score 0 0    Fall Risk: Fall Risk  09/23/2015 05/23/2015  Falls in the past year? No No    Functional Status Survey: Is the patient deaf or have difficulty hearing?: No Does the patient have difficulty seeing, even when wearing glasses/contacts?: Yes (glasses) Does the patient have difficulty concentrating, remembering, or making decisions?: No Does the patient have difficulty walking or climbing stairs?: No Does the patient have difficulty dressing or bathing?: No Does the patient have difficulty doing errands alone such as visiting a doctor's office or shopping?: No   Assessment & Plan  1. Benign hypertension  bp is at goal, continue medication  - valsartan-hydrochlorothiazide (DIOVAN-HCT) 160-12.5 MG tablet; Take 1 tablet by mouth daily.  Dispense: 90 tablet; Refill: 1  2. Needs flu shot  - Flu vaccine HIGH DOSE PF (Fluzone High dose)  3. Dyslipidemia  - rosuvastatin (CRESTOR) 20 MG tablet; Take 1 tablet (20 mg total) by mouth daily.  Dispense: 90 tablet; Refill: 1  4. Chronic kidney disease, stage III (moderate)  Discussed importance of avoiding NSAID's  5. Perennial allergic rhinitis with seasonal variation  Discussed medications options,   6. Blood glucose elevated  Discussed importance of following diet  7. Breast cancer screening  - MM Digital Screening; Future

## 2015-11-18 ENCOUNTER — Other Ambulatory Visit: Payer: Self-pay | Admitting: Family Medicine

## 2015-11-18 ENCOUNTER — Ambulatory Visit
Admission: RE | Admit: 2015-11-18 | Discharge: 2015-11-18 | Disposition: A | Discharge status: Home / Self Care | Payer: Medicare Other | Source: Ambulatory Visit | Attending: Family Medicine | Admitting: Family Medicine

## 2015-11-18 DIAGNOSIS — Z1239 Encounter for other screening for malignant neoplasm of breast: Secondary | ICD-10-CM

## 2015-11-18 DIAGNOSIS — Z1231 Encounter for screening mammogram for malignant neoplasm of breast: Secondary | ICD-10-CM | POA: Insufficient documentation

## 2015-11-18 HISTORY — DX: Malignant (primary) neoplasm, unspecified: C80.1

## 2016-01-03 ENCOUNTER — Encounter: Payer: Self-pay | Admitting: Family Medicine

## 2016-01-03 ENCOUNTER — Ambulatory Visit (INDEPENDENT_AMBULATORY_CARE_PROVIDER_SITE_OTHER): Payer: Medicare Other | Admitting: Family Medicine

## 2016-01-03 VITALS — BP 116/74 | HR 67 | Temp 97.8°F | Resp 16 | Ht 65.0 in | Wt 172.1 lb

## 2016-01-03 DIAGNOSIS — Z0000 Encounter for general adult medical examination without abnormal findings: Secondary | ICD-10-CM

## 2016-01-03 DIAGNOSIS — I10 Essential (primary) hypertension: Secondary | ICD-10-CM

## 2016-01-03 DIAGNOSIS — N183 Chronic kidney disease, stage 3 unspecified: Secondary | ICD-10-CM

## 2016-01-03 DIAGNOSIS — E785 Hyperlipidemia, unspecified: Secondary | ICD-10-CM | POA: Diagnosis not present

## 2016-01-03 DIAGNOSIS — E559 Vitamin D deficiency, unspecified: Secondary | ICD-10-CM | POA: Diagnosis not present

## 2016-01-03 DIAGNOSIS — Z862 Personal history of diseases of the blood and blood-forming organs and certain disorders involving the immune mechanism: Secondary | ICD-10-CM | POA: Diagnosis not present

## 2016-01-03 DIAGNOSIS — K5900 Constipation, unspecified: Secondary | ICD-10-CM | POA: Diagnosis not present

## 2016-01-03 DIAGNOSIS — Z8541 Personal history of malignant neoplasm of cervix uteri: Secondary | ICD-10-CM

## 2016-01-03 DIAGNOSIS — K5909 Other constipation: Secondary | ICD-10-CM

## 2016-01-03 DIAGNOSIS — R739 Hyperglycemia, unspecified: Secondary | ICD-10-CM | POA: Diagnosis not present

## 2016-01-03 MED ORDER — POLYETHYLENE GLYCOL 3350 17 GM/SCOOP PO POWD: 17.0000 g | Freq: Every day | ORAL | Status: DC

## 2016-01-03 MED ORDER — VALSARTAN-HYDROCHLOROTHIAZIDE 160-12.5 MG PO TABS: 1.0000 | ORAL_TABLET | Freq: Every day | ORAL | Status: DC

## 2016-01-03 MED ORDER — ROSUVASTATIN CALCIUM 20 MG PO TABS: 20.0000 mg | ORAL_TABLET | Freq: Every day | ORAL | Status: DC

## 2016-01-03 NOTE — Progress Notes (Signed)
Name: Morgan Livingston   MRN: [id number]    DOB: Nov 01, 1946   Date:01/03/2016       Progress Note  Subjective  Chief Complaint  Chief Complaint  Patient presents with  . Annual Exam    HPI  HTN: Mrs Morgan Livingston is here today for her regular follow up and medicare physical,  she denies chest pain, SOB or palpitation.   GERD: she is doing well on Omeprazole , she states symptoms resolved, no regurgitation or heartburn, she has changed her diet  Hyperlipidemia: taking Crestor 20 mg daily and denies side effects of medication.   CKI: GFR has been stable , advised to avoid nsaid's. Good urine output  History of anemia: denies fatigue or SOB, not taking iron supplementation  Functional ability/safety issues: No Issues Hearing issues: Addressed  Activities of daily living: Discussed Home safety issues: No Issues  End Of Life Planning: Offered verbal information regarding advanced directives, healthcare power of attorney.  Preventative care, Health maintenance, Preventative health measures discussed.  Preventative screenings discussed today: lab work, colonoscopy, PAP - she has a history of cervical cancer,  mammogram, DEXA.  Low Dose CT Chest recommended if Age 54-80 years, 30 pack-year currently smoking OR have quit w/in 15years.   Lifestyle risk factor issued reviewed: Diet, exercise, weight management, advised patient smoking is not healthy, nutrition/diet.  Preventative health measures discussed (5-10 year plan).  Reviewed and recommended vaccinations: - Pneumovax  - Prevnar  - Annual Influenza - Zostavax - Tdap   Depression screening: Done Fall risk screening: Done Discuss ADLs/IADLs: Done  Current medical providers: See HPI  Other health risk factors identified this visit: No other issues Cognitive impairment issues: None identified  All above discussed with patient. Appropriate education, counseling and referral will be made based upon the above.   Patient  Active Problem List   Diagnosis Date Noted  . History of hysterectomy 05/23/2015  . Chronic kidney disease, stage III (moderate) 05/23/2015  . Benign hypertension 05/22/2015  . Chronic constipation 05/22/2015  . DDD (degenerative disc disease), lumbar 05/22/2015  . DD (diverticular disease) 05/22/2015  . Dyslipidemia 05/22/2015  . History of cervical cancer 05/22/2015  . Helicobacter pylori gastrointestinal tract infection 05/22/2015  . Blood glucose elevated 05/22/2015  . Overweight (BMI 25.0-29.9) 05/22/2015  . Perennial allergic rhinitis with seasonal variation 05/22/2015  . Vitamin D deficiency 05/22/2015  . Impingement syndrome of shoulder 01/14/2015    Past Surgical History  Procedure Laterality Date  . Abdominal hysterectomy    . Breast biopsy Right 04/09/2006    negative    History reviewed. No pertinent family history.  Social History   Social History  . Marital Status: Married    Spouse Name: N/A  . Number of Children: N/A  . Years of Education: N/A   Occupational History  . Not on file.   Social History Main Topics  . Smoking status: Never Smoker   . Smokeless tobacco: Never Used  . Alcohol Use: No  . Drug Use: No  . Sexual Activity:    Partners: Male   Other Topics Concern  . Not on file   Social History Narrative     Current outpatient prescriptions:  .  aspirin 81 MG tablet, Take 81 mg by mouth daily., Disp: , Rfl:  .  cholecalciferol (VITAMIN D) 1000 UNITS tablet, Take by mouth., Disp: , Rfl:  .  fexofenadine (ALLEGRA) 180 MG tablet, Take 180 mg by mouth daily., Disp: , Rfl:  .  rosuvastatin (  CRESTOR) 20 MG tablet, Take 1 tablet (20 mg total) by mouth daily., Disp: 90 tablet, Rfl: 1 .  valsartan-hydrochlorothiazide (DIOVAN-HCT) 160-12.5 MG tablet, Take 1 tablet by mouth daily., Disp: 90 tablet, Rfl: 1 .  polyethylene glycol powder (GLYCOLAX/MIRALAX) powder, Take 17 g by mouth daily., Disp: 3350 g, Rfl: 0  No Known  Allergies   ROS  Constitutional: Negative for fever or weight change.  Respiratory: Negative for cough and shortness of breath.   Cardiovascular: Negative for chest pain or palpitations.  Gastrointestinal: Negative for abdominal pain, no bowel changes.  Musculoskeletal: Negative for gait problem or joint swelling.  Skin: Negative for rash.  Neurological: Negative for dizziness or headache.  No other specific complaints in a complete review of systems (except as listed in HPI above).  Objective  Filed Vitals:   01/03/16 0824  BP: 116/74  Pulse: 67  Temp: 97.8 F (36.6 C)  TempSrc: Oral  Resp: 16  Height: 5\' 5"  (1.651 m)  Weight: 172 lb 1.6 oz (78.064 kg)  SpO2: 95%    Body mass index is 28.64 kg/(m^2).  Physical Exam  Constitutional: Patient appears well-developed and well-nourished. No distress.  HENT: Head: Normocephalic and atraumatic. Ears: B TMs ok, no erythema or effusion; Nose: Nose normal. Mouth/Throat: Oropharynx is clear and moist. No oropharyngeal exudate.  Eyes: Conjunctivae and EOM are normal. Pupils are equal, round, and reactive to light. No scleral icterus.  Neck: Normal range of motion. Neck supple. No JVD present. No thyromegaly present.  Cardiovascular: Normal rate, regular rhythm and normal heart sounds.  No murmur heard. No BLE edema. Pulmonary/Chest: Effort normal and breath sounds normal. No respiratory distress. Abdominal: Soft. Bowel sounds are normal, no distension. There is no tenderness. no masses Breast: no lumps or masses, no nipple discharge or rashes FEMALE GENITALIA:  External genitalia normal External urethra normal Vaginal vault normal without discharge or lesions Cervix absent Bimanual exam normal without masses RECTAL: not done Musculoskeletal: Normal range of motion, no joint effusions. No gross deformities Neurological: he is alert and oriented to person, place, and time. No cranial nerve deficit. Coordination, balance, strength,  speech and gait are normal.  Skin: Skin is warm and dry. No rash noted. No erythema.  Psychiatric: Patient has a normal mood and affect. behavior is normal. Judgment and thought content normal.  PHQ2/9: Depression screen [facility] 2/9 01/03/2016 09/23/2015 05/23/2015  Decreased Interest 0 0 0  Down, Depressed, Hopeless 0 0 0  PHQ - 2 Score 0 0 0    Fall Risk: Fall Risk  01/03/2016 09/23/2015 05/23/2015  Falls in the past year? No No No    Functional Status Survey: Is the patient deaf or have difficulty hearing?: No Does the patient have difficulty seeing, even when wearing glasses/contacts?: No Does the patient have difficulty concentrating, remembering, or making decisions?: No Does the patient have difficulty walking or climbing stairs?: No Does the patient have difficulty dressing or bathing?: No Does the patient have difficulty doing errands alone such as visiting a doctor's office or shopping?: No    Assessment & Plan  1. Medicare annual wellness visit, subsequent  Discussed importance of 150 minutes of physical activity weekly, eat two servings of fish weekly, eat one serving of tree nuts ( cashews, pistachios, pecans, almonds.Marland Kitchen) every other day, eat 6 servings of fruit/vegetables daily and drink plenty of water and avoid sweet beverages.   2. Benign hypertension  - Comprehensive metabolic panel - CBC with Differential/Platelet - valsartan-hydrochlorothiazide (DIOVAN-HCT) 160-12.5 MG tablet;  Take 1 tablet by mouth daily.  Dispense: 90 tablet; Refill: 1  3. Dyslipidemia  - Lipid panel - rosuvastatin (CRESTOR) 20 MG tablet; Take 1 tablet (20 mg total) by mouth daily.  Dispense: 90 tablet; Refill: 1  4. Chronic kidney disease, stage III (moderate)  - Comprehensive metabolic panel - CBC with Differential/Platelet  5. Blood glucose elevated  - Comprehensive metabolic panel - Hemoglobin A1c  6. Vitamin D deficiency  - VITAMIN D 25 Hydroxy (Vit-D Deficiency,  Fractures)  7. History of cervical cancer  - PapLb, HPV, rfx16/18  8. Intermittent constipation  - polyethylene glycol powder (GLYCOLAX/MIRALAX) powder; Take 17 g by mouth daily.  Dispense: 3350 g; Refill: 0  9. History of anemia  - CBC with Differential/Platelet

## 2016-01-04 LAB — CBC WITH DIFFERENTIAL/PLATELET
Basophils Absolute: 0 10*3/uL (ref 0.0–0.2)
Basos: 0 %
EOS (ABSOLUTE): 0.1 10*3/uL (ref 0.0–0.4)
Eos: 2 %
Hematocrit: 36.2 % (ref 34.0–46.6)
Hemoglobin: 12.4 g/dL (ref 11.1–15.9)
IMMATURE GRANULOCYTES: 0 %
Immature Grans (Abs): 0 10*3/uL (ref 0.0–0.1)
Lymphocytes Absolute: 3.5 10*3/uL — ABNORMAL HIGH (ref 0.7–3.1)
Lymphs: 45 %
MCH: 31 pg (ref 26.6–33.0)
MCHC: 34.3 g/dL (ref 31.5–35.7)
MCV: 91 fL (ref 79–97)
MONOS ABS: 0.8 10*3/uL (ref 0.1–0.9)
Monocytes: 10 %
NEUTROS PCT: 43 %
Neutrophils Absolute: 3.3 10*3/uL (ref 1.4–7.0)
PLATELETS: 155 10*3/uL (ref 150–379)
RBC: 4 x10E6/uL (ref 3.77–5.28)
RDW: 14.6 % (ref 12.3–15.4)
WBC: 7.6 10*3/uL (ref 3.4–10.8)

## 2016-01-04 LAB — COMPREHENSIVE METABOLIC PANEL
A/G RATIO: 1.2 (ref 1.1–2.5)
ALT: 18 IU/L (ref 0–32)
AST: 29 IU/L (ref 0–40)
Albumin: 4.1 g/dL (ref 3.6–4.8)
Alkaline Phosphatase: 69 IU/L (ref 39–117)
BILIRUBIN TOTAL: 0.3 mg/dL (ref 0.0–1.2)
BUN/Creatinine Ratio: 11 (ref 11–26)
BUN: 15 mg/dL (ref 8–27)
CALCIUM: 9.9 mg/dL (ref 8.7–10.3)
CHLORIDE: 101 mmol/L (ref 96–106)
CO2: 26 mmol/L (ref 18–29)
Creatinine, Ser: 1.33 mg/dL — ABNORMAL HIGH (ref 0.57–1.00)
GFR calc Af Amer: 47 mL/min/{1.73_m2} — ABNORMAL LOW (ref >59)
GFR, EST NON AFRICAN AMERICAN: 41 mL/min/{1.73_m2} — AB (ref >59)
GLOBULIN, TOTAL: 3.3 g/dL (ref 1.5–4.5)
Glucose: 89 mg/dL (ref 65–99)
POTASSIUM: 4.3 mmol/L (ref 3.5–5.2)
SODIUM: 143 mmol/L (ref 134–144)
Total Protein: 7.4 g/dL (ref 6.0–8.5)

## 2016-01-04 LAB — HEMOGLOBIN A1C
Est. average glucose Bld gHb Est-mCnc: 137 mg/dL
HEMOGLOBIN A1C: 6.4 % — AB (ref 4.8–5.6)

## 2016-01-04 LAB — LIPID PANEL
Chol/HDL Ratio: 4.3 ratio units (ref 0.0–4.4)
Cholesterol, Total: 213 mg/dL — ABNORMAL HIGH (ref 100–199)
HDL: 50 mg/dL (ref >39)
LDL Calculated: 137 mg/dL — ABNORMAL HIGH (ref 0–99)
Triglycerides: 132 mg/dL (ref 0–149)
VLDL Cholesterol Cal: 26 mg/dL (ref 5–40)

## 2016-01-04 LAB — VITAMIN D 25 HYDROXY (VIT D DEFICIENCY, FRACTURES): Vit D, 25-Hydroxy: 42.5 ng/mL (ref 30.0–100.0)

## 2016-01-05 LAB — PAPLB, HPV, RFX16/18
HPV, high-risk: NEGATIVE
PAP Smear Comment: 0

## 2016-02-21 ENCOUNTER — Encounter: Payer: Self-pay | Admitting: Family Medicine

## 2016-07-02 ENCOUNTER — Encounter: Payer: Self-pay | Admitting: Family Medicine

## 2016-07-02 ENCOUNTER — Ambulatory Visit (INDEPENDENT_AMBULATORY_CARE_PROVIDER_SITE_OTHER): Payer: Medicare Other | Admitting: Family Medicine

## 2016-07-02 VITALS — BP 117/71 | HR 68 | Temp 98.0°F | Resp 15 | Ht 65.0 in | Wt 170.3 lb

## 2016-07-02 DIAGNOSIS — E785 Hyperlipidemia, unspecified: Secondary | ICD-10-CM

## 2016-07-02 DIAGNOSIS — K5909 Other constipation: Secondary | ICD-10-CM

## 2016-07-02 DIAGNOSIS — R739 Hyperglycemia, unspecified: Secondary | ICD-10-CM

## 2016-07-02 DIAGNOSIS — N183 Chronic kidney disease, stage 3 unspecified: Secondary | ICD-10-CM

## 2016-07-02 DIAGNOSIS — J302 Other seasonal allergic rhinitis: Secondary | ICD-10-CM

## 2016-07-02 DIAGNOSIS — J309 Allergic rhinitis, unspecified: Secondary | ICD-10-CM

## 2016-07-02 DIAGNOSIS — K219 Gastro-esophageal reflux disease without esophagitis: Secondary | ICD-10-CM | POA: Diagnosis not present

## 2016-07-02 DIAGNOSIS — I10 Essential (primary) hypertension: Secondary | ICD-10-CM

## 2016-07-02 DIAGNOSIS — K5900 Constipation, unspecified: Secondary | ICD-10-CM

## 2016-07-02 DIAGNOSIS — J3089 Other allergic rhinitis: Secondary | ICD-10-CM

## 2016-07-02 MED ORDER — ROSUVASTATIN CALCIUM 40 MG PO TABS: 40.0000 mg | ORAL_TABLET | Freq: Every day | ORAL | 0 refills | Status: DC

## 2016-07-02 MED ORDER — VALSARTAN-HYDROCHLOROTHIAZIDE 160-12.5 MG PO TABS: 0.5000 | ORAL_TABLET | Freq: Every day | ORAL | 0 refills | Status: DC

## 2016-07-02 NOTE — Progress Notes (Signed)
Name: CRISTIAN SLAVEY   MRN: [id number]    DOB: 06-Jul-1946   Date:07/02/2016       Progress Note  Subjective  Chief Complaint  Chief Complaint  Patient presents with  . Follow-up    6 mo    HPI  HTN: Mrs Khari Gillooly is here today for BP follow up.  She denies chest pain, SOB or palpitation. BP has been low over the past couple of visits and also at home. Discussed that because of her age we would prefer her bp to be in the 130's. She still has a lot of Valsartan/HCTZ 160/12.5 and she will try taking half and rechecking bp at home. She will call back for refills.    GERD: she is off Omeprazole , she states symptoms resolved, no regurgitation or heartburn, she has changed her diet and is doing well now.   Hyperlipidemia: taking Crestor 20 mg daily and denies side effects of medication. Last LDL was still elevated, discussed going up to Crestor 40 mg   CKI: GFR has been stable , advised to avoid nsaid's. Good urine output, denies pruritis   History of anemia: denies fatigue or SOB, not taking iron supplementation, last hgb was normal   AR: she has been taking Allegra otc, she denies rhinorrhea or nasal congestion  Patient Active Problem List   Diagnosis Date Noted  . History of hysterectomy 05/23/2015  . Chronic kidney disease, stage III (moderate) 05/23/2015  . Benign hypertension 05/22/2015  . Chronic constipation 05/22/2015  . DDD (degenerative disc disease), lumbar 05/22/2015  . DD (diverticular disease) 05/22/2015  . Dyslipidemia 05/22/2015  . History of cervical cancer 05/22/2015  . Helicobacter pylori gastrointestinal tract infection 05/22/2015  . Blood glucose elevated 05/22/2015  . Overweight (BMI 25.0-29.9) 05/22/2015  . Perennial allergic rhinitis with seasonal variation 05/22/2015  . Vitamin D deficiency 05/22/2015  . Impingement syndrome of shoulder 01/14/2015    Past Surgical History:  Procedure Laterality Date  . ABDOMINAL HYSTERECTOMY    . BREAST BIOPSY  Right 04/09/2006   negative    History reviewed. No pertinent family history.  Social History   Social History  . Marital status: Married    Spouse name: N/A  . Number of children: N/A  . Years of education: N/A   Occupational History  . Not on file.   Social History Main Topics  . Smoking status: Never Smoker  . Smokeless tobacco: Never Used  . Alcohol use No  . Drug use: No  . Sexual activity: Yes    Partners: Male   Other Topics Concern  . Not on file   Social History Narrative  . No narrative on file     Current Outpatient Prescriptions:  .  aspirin 81 MG tablet, Take 81 mg by mouth daily., Disp: , Rfl:  .  cholecalciferol (VITAMIN D) 1000 UNITS tablet, Take by mouth., Disp: , Rfl:  .  fexofenadine (ALLEGRA) 180 MG tablet, Take 180 mg by mouth daily., Disp: , Rfl:  .  polyethylene glycol powder (GLYCOLAX/MIRALAX) powder, Take 17 g by mouth daily., Disp: 3350 g, Rfl: 0 .  rosuvastatin (CRESTOR) 20 MG tablet, Take 1 tablet (20 mg total) by mouth daily., Disp: 90 tablet, Rfl: 1 .  valsartan-hydrochlorothiazide (DIOVAN-HCT) 160-12.5 MG tablet, Take 1 tablet by mouth daily., Disp: 90 tablet, Rfl: 1  No Known Allergies   ROS  Constitutional: Negative for fever or significant  weight change.  Respiratory: Negative for cough and shortness of breath.  Cardiovascular: Negative for chest pain or palpitations.  Gastrointestinal: Negative for abdominal pain, no bowel changes.  Musculoskeletal: Negative for gait problem or joint swelling.  Skin: Negative for rash.  Neurological: Negative for dizziness or headache.  No other specific complaints in a complete review of systems (except as listed in HPI above).  Objective  Vitals:   07/02/16 0758  BP: 117/71  Pulse: 68  Resp: 15  Temp: 98 F (36.7 C)  TempSrc: Oral  SpO2: 95%  Weight: 170 lb 4.8 oz (77.2 kg)  Height: 5\' 5"  (1.651 m)    Body mass index is 28.34 kg/m.  Physical Exam  Constitutional: Patient  appears well-developed and well-nourished. Obese  No distress.  HEENT: head atraumatic, normocephalic, pupils equal and reactive to light,  neck supple, throat within normal limits Cardiovascular: Normal rate, regular rhythm and normal heart sounds.  No murmur heard. No BLE edema. Pulmonary/Chest: Effort normal and breath sounds normal. No respiratory distress. Abdominal: Soft.  There is no tenderness. Psychiatric: Patient has a normal mood and affect. behavior is normal. Judgment and thought content normal.  PHQ2/9: Depression screen [facility] 2/9 07/02/2016 01/03/2016 09/23/2015 05/23/2015  Decreased Interest 0 0 0 0  Down, Depressed, Hopeless 0 0 0 0  PHQ - 2 Score 0 0 0 0     Fall Risk: Fall Risk  07/02/2016 01/03/2016 09/23/2015 05/23/2015  Falls in the past year? No No No No      Functional Status Survey: Is the patient deaf or have difficulty hearing?: No Does the patient have difficulty seeing, even when wearing glasses/contacts?: Yes (glasses) Does the patient have difficulty concentrating, remembering, or making decisions?: No Does the patient have difficulty walking or climbing stairs?: No Does the patient have difficulty dressing or bathing?: No Does the patient have difficulty doing errands alone such as visiting a doctor's office or shopping?: No    Assessment & Plan  1. Benign hypertension  We will decrease dose to half pill and monitor bp at home - valsartan-hydrochlorothiazide (DIOVAN-HCT) 160-12.5 MG tablet; Take 0.5-1 tablets by mouth daily.  Dispense: 90 tablet; Refill: 0  2. Dyslipidemia  We will increase dose to 40 mg since it was not at goal  - rosuvastatin (CRESTOR) 40 MG tablet; Take 1 tablet (40 mg total) by mouth daily.  Dispense: 90 tablet; Refill: 0  3. Chronic kidney disease, stage III (moderate)  Recheck labs in Feb  4. Blood glucose elevated  Recheck next visit - last hgbA1C was 6.4%  5. Intermittent constipation  Continue Miralax prn   6.  Perennial allergic rhinitis with seasonal variation  Continue otc medication   7. Gastroesophageal reflux disease without esophagitis  Well controlled with life style modification

## 2016-08-22 ENCOUNTER — Ambulatory Visit (INDEPENDENT_AMBULATORY_CARE_PROVIDER_SITE_OTHER): Payer: Medicare Other

## 2016-08-22 DIAGNOSIS — Z23 Encounter for immunization: Secondary | ICD-10-CM

## 2016-09-05 ENCOUNTER — Other Ambulatory Visit: Payer: Self-pay | Admitting: Family Medicine

## 2016-09-05 DIAGNOSIS — I10 Essential (primary) hypertension: Secondary | ICD-10-CM

## 2016-09-05 NOTE — Telephone Encounter (Signed)
Patient requesting refill of Valsartan-HCTZ to [facility].  

## 2016-10-10 ENCOUNTER — Other Ambulatory Visit: Payer: Self-pay | Admitting: Family Medicine

## 2016-10-10 DIAGNOSIS — Z1231 Encounter for screening mammogram for malignant neoplasm of breast: Secondary | ICD-10-CM

## 2016-11-19 ENCOUNTER — Ambulatory Visit
Admission: RE | Admit: 2016-11-19 | Discharge: 2016-11-19 | Disposition: A | Discharge status: Home / Self Care | Payer: Medicare Other | Source: Ambulatory Visit | Attending: Family Medicine | Admitting: Family Medicine

## 2016-11-19 DIAGNOSIS — Z1231 Encounter for screening mammogram for malignant neoplasm of breast: Secondary | ICD-10-CM | POA: Diagnosis not present

## 2016-11-20 ENCOUNTER — Other Ambulatory Visit: Payer: Self-pay | Admitting: Family Medicine

## 2016-11-20 DIAGNOSIS — E785 Hyperlipidemia, unspecified: Secondary | ICD-10-CM

## 2016-11-20 NOTE — Telephone Encounter (Signed)
Patient requesting refill or Crestor to [facility].

## 2016-12-03 ENCOUNTER — Other Ambulatory Visit: Payer: Self-pay | Admitting: Family Medicine

## 2016-12-03 DIAGNOSIS — E785 Hyperlipidemia, unspecified: Secondary | ICD-10-CM

## 2017-01-04 ENCOUNTER — Ambulatory Visit (INDEPENDENT_AMBULATORY_CARE_PROVIDER_SITE_OTHER): Payer: Medicare Other | Admitting: Family Medicine

## 2017-01-04 ENCOUNTER — Encounter: Payer: Self-pay | Admitting: Family Medicine

## 2017-01-04 VITALS — BP 118/68 | HR 89 | Temp 98.2°F | Resp 16 | Ht 65.0 in | Wt 167.3 lb

## 2017-01-04 DIAGNOSIS — E785 Hyperlipidemia, unspecified: Secondary | ICD-10-CM

## 2017-01-04 DIAGNOSIS — Z8541 Personal history of malignant neoplasm of cervix uteri: Secondary | ICD-10-CM

## 2017-01-04 DIAGNOSIS — I10 Essential (primary) hypertension: Secondary | ICD-10-CM | POA: Diagnosis not present

## 2017-01-04 DIAGNOSIS — Z0000 Encounter for general adult medical examination without abnormal findings: Secondary | ICD-10-CM

## 2017-01-04 DIAGNOSIS — J302 Other seasonal allergic rhinitis: Secondary | ICD-10-CM

## 2017-01-04 DIAGNOSIS — E559 Vitamin D deficiency, unspecified: Secondary | ICD-10-CM | POA: Diagnosis not present

## 2017-01-04 DIAGNOSIS — J3089 Other allergic rhinitis: Secondary | ICD-10-CM

## 2017-01-04 DIAGNOSIS — R739 Hyperglycemia, unspecified: Secondary | ICD-10-CM

## 2017-01-04 DIAGNOSIS — E663 Overweight: Secondary | ICD-10-CM

## 2017-01-04 DIAGNOSIS — Z90710 Acquired absence of both cervix and uterus: Secondary | ICD-10-CM

## 2017-01-04 LAB — CBC WITH DIFFERENTIAL/PLATELET
BASOS ABS: 0 {cells}/uL (ref 0–200)
Basophils Relative: 0 %
EOS ABS: 74 {cells}/uL (ref 15–500)
Eosinophils Relative: 1 %
HCT: 39.4 % (ref 35.0–45.0)
HEMOGLOBIN: 13 g/dL (ref 11.7–15.5)
Lymphocytes Relative: 40 %
Lymphs Abs: 2960 cells/uL (ref 850–3900)
MCH: 30.6 pg (ref 27.0–33.0)
MCHC: 33 g/dL (ref 32.0–36.0)
MCV: 92.7 fL (ref 80.0–100.0)
MONO ABS: 740 {cells}/uL (ref 200–950)
MPV: 11.3 fL (ref 7.5–12.5)
Monocytes Relative: 10 %
NEUTROS ABS: 3626 {cells}/uL (ref 1500–7800)
Neutrophils Relative %: 49 %
Platelets: 159 10*3/uL (ref 140–400)
RBC: 4.25 MIL/uL (ref 3.80–5.10)
RDW: 15.4 % — ABNORMAL HIGH (ref 11.0–15.0)
WBC: 7.4 10*3/uL (ref 3.8–10.8)

## 2017-01-04 LAB — LIPID PANEL
CHOL/HDL RATIO: 3.9 ratio (ref <5.0)
Cholesterol: 197 mg/dL (ref <200)
HDL: 51 mg/dL (ref >50)
LDL CALC: 126 mg/dL — AB (ref <100)
Triglycerides: 101 mg/dL (ref <150)
VLDL: 20 mg/dL (ref <30)

## 2017-01-04 LAB — COMPLETE METABOLIC PANEL WITH GFR
ALT: 15 U/L (ref 6–29)
AST: 23 U/L (ref 10–35)
Albumin: 4 g/dL (ref 3.6–5.1)
Alkaline Phosphatase: 60 U/L (ref 33–130)
BUN: 17 mg/dL (ref 7–25)
CHLORIDE: 103 mmol/L (ref 98–110)
CO2: 29 mmol/L (ref 20–31)
Calcium: 9.4 mg/dL (ref 8.6–10.4)
Creat: 1.39 mg/dL — ABNORMAL HIGH (ref 0.50–0.99)
GFR, EST NON AFRICAN AMERICAN: 39 mL/min — AB (ref >=60)
GFR, Est African American: 45 mL/min — ABNORMAL LOW (ref >=60)
GLUCOSE: 98 mg/dL (ref 65–99)
POTASSIUM: 4.2 mmol/L (ref 3.5–5.3)
SODIUM: 140 mmol/L (ref 135–146)
TOTAL PROTEIN: 7.4 g/dL (ref 6.1–8.1)
Total Bilirubin: 0.4 mg/dL (ref 0.2–1.2)

## 2017-01-04 MED ORDER — VALSARTAN-HYDROCHLOROTHIAZIDE 160-12.5 MG PO TABS: 0.5000 | ORAL_TABLET | Freq: Every day | ORAL | 0 refills | Status: DC

## 2017-01-04 MED ORDER — ROSUVASTATIN CALCIUM 20 MG PO TABS: 20.0000 mg | ORAL_TABLET | Freq: Every day | ORAL | 0 refills | Status: DC

## 2017-01-04 NOTE — Progress Notes (Signed)
Name: Morgan Livingston   MRN: [id number]    DOB: 10-Jun-1946   Date:01/04/2017       Progress Note  Subjective  Chief Complaint  Chief Complaint  Patient presents with  . Medicare Wellness  . Hypertension    HPI  HTN: she states tried going down ton bp medication since last visit, but bp went up a couple times to 140/80's-90's, but she got scared and went up to one full pill a day. Explained that 140/90 occasionally is not dangerous, but low bp can cause hypotension and falls. No chest pain, no SOB , she has occasional  palpitation when very hungry  Hyperlipidemia: taking Crestor 20 mg daily and denies side effects of medication. We will recheck labs  CKI: GFR has been stable , advised to avoid nsaid's. Good urine output  AR: she still takes Allegra to help with post-nasal drip   Functional ability/safety issues: No Issues Hearing issues: Addressed  Activities of daily living: Discussed Home safety issues: No Issues  End Of Life Planning: Offered verbal information regarding advanced directives, healthcare power of attorney.  Preventative care, Health maintenance, Preventative health measures discussed.  Preventative screenings discussed today: lab work, colonoscopy, PAP - she has a history of cervical cancer,  mammogram, DEXA.  Low Dose CT Chest recommended if Age 52-80 years, 30 pack-year currently smoking OR have quit w/in 15years.   Lifestyle risk factor issued reviewed: Diet, exercise, weight management, advised patient smoking is not healthy, nutrition/diet.  Preventative health measures discussed (5-10 year plan).  Reviewed and recommended vaccinations: - Pneumovax  - Prevnar  - Annual Influenza - Zostavax - Tdap - due for booster, but since not covered by Medicare she will return if she has an injury   Depression screening: Done Fall risk screening: Done Discuss ADLs/IADLs: Done  Current medical providers: See HPI  Other health risk factors  identified this visit: No other issues Cognitive impairment issues: None identified  All above discussed with patient. Appropriate education, counseling and referral will be made based upon the above.    Patient Active Problem List   Diagnosis Date Noted  . History of hysterectomy 05/23/2015  . Chronic kidney disease, stage III (moderate) 05/23/2015  . Benign hypertension 05/22/2015  . Chronic constipation 05/22/2015  . DDD (degenerative disc disease), lumbar 05/22/2015  . DD (diverticular disease) 05/22/2015  . Dyslipidemia 05/22/2015  . History of cervical cancer 05/22/2015  . Helicobacter pylori gastrointestinal tract infection 05/22/2015  . Blood glucose elevated 05/22/2015  . Overweight (BMI 25.0-29.9) 05/22/2015  . Perennial allergic rhinitis with seasonal variation 05/22/2015  . Vitamin D deficiency 05/22/2015  . Impingement syndrome of shoulder 01/14/2015    Past Surgical History:  Procedure Laterality Date  . ABDOMINAL HYSTERECTOMY    . BREAST BIOPSY Right 04/09/2006   negative    Family History  Problem Relation Age of Onset  . Breast cancer Neg Hx     Social History   Social History  . Marital status: Married    Spouse name: N/A  . Number of children: N/A  . Years of education: N/A   Occupational History  . Not on file.   Social History Main Topics  . Smoking status: Never Smoker  . Smokeless tobacco: Never Used  . Alcohol use No  . Drug use: No  . Sexual activity: Yes    Partners: Male   Other Topics Concern  . Not on file   Social History Narrative  . No narrative on file  Current Outpatient Prescriptions:  .  aspirin 81 MG tablet, Take 81 mg by mouth daily., Disp: , Rfl:  .  cholecalciferol (VITAMIN D) 1000 UNITS tablet, Take by mouth., Disp: , Rfl:  .  fexofenadine (ALLEGRA) 180 MG tablet, Take 180 mg by mouth daily., Disp: , Rfl:  .  polyethylene glycol powder (GLYCOLAX/MIRALAX) powder, Take 17 g by mouth daily., Disp: 3350 g, Rfl:  0 .  rosuvastatin (CRESTOR) 20 MG tablet, Take 1 tablet (20 mg total) by mouth daily., Disp: 90 tablet, Rfl: 0 .  valsartan-hydrochlorothiazide (DIOVAN-HCT) 160-12.5 MG tablet, Take 0.5 tablets by mouth daily., Disp: 90 tablet, Rfl: 0  No Known Allergies   ROS  Constitutional: Negative for fever , positive for mild weight change.  Respiratory: Negative for cough and shortness of breath.   Cardiovascular: Negative for chest pain or palpitations.  Gastrointestinal: Negative for abdominal pain, no bowel changes.  Musculoskeletal: Negative for gait problem or joint swelling.  Skin: Negative for rash.  Neurological: Negative for dizziness or headache.  No other specific complaints in a complete review of systems (except as listed in HPI above).  Objective  Vitals:   01/04/17 0745  BP: 118/68  Pulse: 89  Resp: 16  Temp: 98.2 F (36.8 C)  SpO2: 96%  Weight: 167 lb 5 oz (75.9 kg)  Height: 5\' 5"  (1.651 m)    Body mass index is 27.84 kg/m.  Physical Exam  Constitutional: Patient appears well-developed and overeweight.No distress.  HENT: Head: Normocephalic and atraumatic. Ears: B TMs ok, no erythema or effusion; Nose: Nose normal. Mouth/Throat: Oropharynx is clear and moist. No oropharyngeal exudate.  Eyes: Conjunctivae and EOM are normal. Pupils are equal, round, and reactive to light. No scleral icterus.  Neck: Normal range of motion. Neck supple. No JVD present. No thyromegaly present.  Cardiovascular: Normal rate, regular rhythm and normal heart sounds.  No murmur heard. No BLE edema. Pulmonary/Chest: Effort normal and breath sounds normal. No respiratory distress. Abdominal: Soft. Bowel sounds are normal, no distension. There is no tenderness. no masses Breast: no lumps or masses, no nipple discharge or rashes FEMALE GENITALIA:  Not done RECTAL: not done Musculoskeletal: Normal range of motion, no joint effusions. No gross deformities Neurological: he is alert and oriented  to person, place, and time. No cranial nerve deficit. Coordination, balance, strength, speech and gait are normal.  Skin: Skin is warm and dry. No rash noted. No erythema.  Psychiatric: Patient has a normal mood and affect. behavior is normal. Judgment and thought content normal.  PHQ2/9: Depression screen [facility] 2/9 01/04/2017 07/02/2016 01/03/2016 09/23/2015 05/23/2015  Decreased Interest 0 0 0 0 0  Down, Depressed, Hopeless 0 0 0 0 0  PHQ - 2 Score 0 0 0 0 0     Fall Risk: Fall Risk  01/04/2017 07/02/2016 01/03/2016 09/23/2015 05/23/2015  Falls in the past year? No No No No No     Functional Status Survey: Is the patient deaf or have difficulty hearing?: No Does the patient have difficulty seeing, even when wearing glasses/contacts?: No Does the patient have difficulty concentrating, remembering, or making decisions?: No Does the patient have difficulty walking or climbing stairs?: No Does the patient have difficulty dressing or bathing?: No Does the patient have difficulty doing errands alone such as visiting a doctor's office or shopping?: No    Assessment & Plan  1. Medicare annual wellness visit, subsequent  Discussed importance of 150 minutes of physical activity weekly, eat two servings of fish weekly,  eat one serving of tree nuts ( cashews, pistachios, pecans, almonds.Marland Kitchen) every other day, eat 6 servings of fruit/vegetables daily and drink plenty of water and avoid sweet beverages.   2. Benign hypertension  - valsartan-hydrochlorothiazide (DIOVAN-HCT) 160-12.5 MG tablet; Take 0.5 tablets by mouth daily.  Dispense: 90 tablet; Refill: 0 - COMPLETE METABOLIC PANEL WITH GFR - CBC with Differential/Platelet  3. Dyslipidemia  - rosuvastatin (CRESTOR) 20 MG tablet; Take 1 tablet (20 mg total) by mouth daily.  Dispense: 90 tablet; Refill: 0 - Lipid panel  4. Blood glucose elevated  - Hemoglobin A1c - Insulin, fasting  5. Perennial allergic rhinitis with seasonal  variation  Stable at this time  6. Vitamin D deficiency  - VITAMIN D 25 Hydroxy (Vit-D Deficiency, Fractures)  7. History of hysterectomy  Many years ago  8. History of cervical cancer  Normal pap last year  9. Overweight (BMI 25.0-29.9)  - Insulin, fasting

## 2017-01-05 LAB — HEMOGLOBIN A1C
Hgb A1c MFr Bld: 6.2 % — ABNORMAL HIGH (ref <5.7)
MEAN PLASMA GLUCOSE: 131 mg/dL

## 2017-01-05 LAB — VITAMIN D 25 HYDROXY (VIT D DEFICIENCY, FRACTURES): Vit D, 25-Hydroxy: 53 ng/mL (ref 30–100)

## 2017-01-08 LAB — INSULIN, FASTING: INSULIN FASTING, SERUM: 16.3 u[IU]/mL (ref 2.0–19.6)

## 2017-02-27 ENCOUNTER — Other Ambulatory Visit: Payer: Self-pay | Admitting: Family Medicine

## 2017-02-27 DIAGNOSIS — I10 Essential (primary) hypertension: Secondary | ICD-10-CM

## 2017-04-05 ENCOUNTER — Ambulatory Visit (INDEPENDENT_AMBULATORY_CARE_PROVIDER_SITE_OTHER): Payer: Medicare Other | Admitting: Family Medicine

## 2017-04-05 ENCOUNTER — Encounter: Payer: Self-pay | Admitting: Family Medicine

## 2017-04-05 VITALS — BP 160/84 | HR 66 | Temp 97.5°F | Resp 16 | Ht 65.0 in | Wt 170.2 lb

## 2017-04-05 DIAGNOSIS — R002 Palpitations: Secondary | ICD-10-CM

## 2017-04-05 NOTE — Progress Notes (Addendum)
Name: Morgan Livingston   MRN: [id number]    DOB: [birth year]   Date:04/05/2017       Progress Note  Subjective  Chief Complaint  Chief Complaint  Patient presents with  . Hypertension    elevated BP 196/102 for pass 2 days. BP med was cut in half at last visit  . Palpitations    HPI  Pt presents with 1 month history of intermittent palpitations on a daily basis, with some lightheadedness - all without pattern - not on exertion, sometimes wakes up at night with it too. No chest pain, shortness of breath, NVD, no swelling.  Endorses "Jittery" feeling and lightheadedness when the episodes occur. She drinks 1 cup of coffee a day.  She was seen by Dr. Ancil Boozer on 01/04/2017 and her BP was doing well, she was instructed to start cutting her Diovan in half to avoid hypotension. Pt has been doing this but has developed the symptoms as above. She has been checking her BP daily in the morning and yesterday it was 188/102. It is slightly elevated today at 160/84.  Patient Active Problem List   Diagnosis Date Noted  . History of hysterectomy 05/23/2015  . Chronic kidney disease, stage III (moderate) 05/23/2015  . Benign hypertension 05/22/2015  . Chronic constipation 05/22/2015  . DDD (degenerative disc disease), lumbar 05/22/2015  . DD (diverticular disease) 05/22/2015  . Dyslipidemia 05/22/2015  . History of cervical cancer 05/22/2015  . Helicobacter pylori gastrointestinal tract infection 05/22/2015  . Blood glucose elevated 05/22/2015  . Overweight (BMI 25.0-29.9) 05/22/2015  . Perennial allergic rhinitis with seasonal variation 05/22/2015  . Vitamin D deficiency 05/22/2015  . Impingement syndrome of shoulder 01/14/2015    Social History  Substance Use Topics  . Smoking status: Never Smoker  . Smokeless tobacco: Never Used  . Alcohol use No     Current Outpatient Prescriptions:  .  aspirin 81 MG tablet, Take 81 mg by mouth daily., Disp: , Rfl:  .  cholecalciferol (VITAMIN D) 1000 UNITS  tablet, Take by mouth., Disp: , Rfl:  .  fexofenadine (ALLEGRA) 180 MG tablet, Take 180 mg by mouth daily., Disp: , Rfl:  .  polyethylene glycol powder (GLYCOLAX/MIRALAX) powder, Take 17 g by mouth daily., Disp: 3350 g, Rfl: 0 .  rosuvastatin (CRESTOR) 20 MG tablet, Take 1 tablet (20 mg total) by mouth daily., Disp: 90 tablet, Rfl: 0 .  valsartan-hydrochlorothiazide (DIOVAN-HCT) 160-12.5 MG tablet, TAKE 1 TABLET BY MOUTH DAILY., Disp: 90 tablet, Rfl: 1  No Known Allergies  ROS Constitutional: Negative for fever or weight change.  Respiratory: Negative for cough and shortness of breath.   Cardiovascular: See HPI Gastrointestinal: Negative for abdominal pain, no bowel changes.  Musculoskeletal: Negative for gait problem or joint swelling.  Skin: Negative for rash.  Neurological: Negative for dizziness or headache. Positive for lightheadedness.  No other specific complaints in a complete review of systems (except as listed in HPI above).  Objective  Vitals:   04/05/17 1003  BP: (!) 160/84  Pulse: 66  Resp: 16  Temp: 97.5 F (36.4 C)  TempSrc: Oral  SpO2: 93%  Weight: 170 lb 3.2 oz (77.2 kg)  Height: 5\' 5"  (1.651 m)    Body mass index is 28.32 kg/m.  Nursing Note and Vital Signs reviewed.  Physical Exam  Constitutional: Patient appears well-developed and well-nourished. No distress.  HEENT: head atraumatic, normocephalic Cardiovascular: Auscultated rate of 68bom, regular rhythm, S1/S2 present.  No murmur or rub heard. No BLE edema.  Pulmonary/Chest: Effort normal and breath sounds clear. No respiratory distress or retractions. Psychiatric: Patient has a normal mood and affect. behavior is normal. Judgment and thought content normal.  No results found for this or any previous visit (from the past 2160 hour(s)).  Assessment & Plan  1. Heart palpitations - Ambulatory referral to Cardiology - EKG 12-Lead  -EKG Performed today, pt asymptomatic when ECG is performed.  Interpretation: Inferior and lateral lead changes noted from previous EKG, pt is in a sinus bradycardia currently. -Advised there are several possibilities for the cause of her symptoms including thyroid or electrolyte imbalance or paroxysmal A-fib. Able to secure cardiology appointment for today with Dr. Clayborn Bigness at 11:15am. Pt is to go directly there for further evaluation. -Signs and symptoms of stroke and MI discussed at length with patient. -Red flags and when to present for emergency care or RTC including fever >101.44F, chest pain, shortness of breath, new/worsening/un-resolving symptoms reviewed with patient at time of visit. Follow up and care instructions discussed and provided in AVS.  --------------------------------- I have reviewed this encounter including the documentation in this note and/or discussed this patient with the Johney Maine, FNP, NP-C. I am certifying that I agree with the content of this note as supervising physician.  Enid Derry, [facility] 04/05/2017, 11:43 AM

## 2017-04-05 NOTE — Patient Instructions (Addendum)
Please go Directly to Dr. Etta Quill office at the [facility]. Your appointment is at 11:15am. Thank you!

## 2017-04-29 ENCOUNTER — Encounter: Payer: Self-pay | Admitting: Emergency Medicine

## 2017-04-29 ENCOUNTER — Emergency Department
Admission: EM | Admit: 2017-04-29 | Discharge: 2017-04-30 | Disposition: A | Discharge status: Home / Self Care | Payer: Medicare Other | Attending: Emergency Medicine | Admitting: Emergency Medicine

## 2017-04-29 DIAGNOSIS — Z79899 Other long term (current) drug therapy: Secondary | ICD-10-CM | POA: Insufficient documentation

## 2017-04-29 DIAGNOSIS — Z8542 Personal history of malignant neoplasm of other parts of uterus: Secondary | ICD-10-CM | POA: Diagnosis not present

## 2017-04-29 DIAGNOSIS — I129 Hypertensive chronic kidney disease with stage 1 through stage 4 chronic kidney disease, or unspecified chronic kidney disease: Secondary | ICD-10-CM | POA: Insufficient documentation

## 2017-04-29 DIAGNOSIS — N183 Chronic kidney disease, stage 3 (moderate): Secondary | ICD-10-CM | POA: Diagnosis not present

## 2017-04-29 DIAGNOSIS — Z8541 Personal history of malignant neoplasm of cervix uteri: Secondary | ICD-10-CM | POA: Insufficient documentation

## 2017-04-29 DIAGNOSIS — Z7982 Long term (current) use of aspirin: Secondary | ICD-10-CM | POA: Diagnosis not present

## 2017-04-29 DIAGNOSIS — I10 Essential (primary) hypertension: Secondary | ICD-10-CM

## 2017-04-29 DIAGNOSIS — R002 Palpitations: Secondary | ICD-10-CM | POA: Diagnosis present

## 2017-04-29 LAB — CBC
HEMATOCRIT: 37.5 % (ref 35.0–47.0)
Hemoglobin: 12.8 g/dL (ref 12.0–16.0)
MCH: 30.7 pg (ref 26.0–34.0)
MCHC: 34.3 g/dL (ref 32.0–36.0)
MCV: 89.7 fL (ref 80.0–100.0)
Platelets: 136 10*3/uL — ABNORMAL LOW (ref 150–440)
RBC: 4.18 MIL/uL (ref 3.80–5.20)
RDW: 14.4 % (ref 11.5–14.5)
WBC: 8.6 10*3/uL (ref 3.6–11.0)

## 2017-04-29 LAB — BASIC METABOLIC PANEL
Anion gap: 6 (ref 5–15)
BUN: 17 mg/dL (ref 6–20)
CHLORIDE: 107 mmol/L (ref 101–111)
CO2: 29 mmol/L (ref 22–32)
Calcium: 9.4 mg/dL (ref 8.9–10.3)
Creatinine, Ser: 1.35 mg/dL — ABNORMAL HIGH (ref 0.44–1.00)
GFR calc Af Amer: 45 mL/min — ABNORMAL LOW (ref >60)
GFR, EST NON AFRICAN AMERICAN: 39 mL/min — AB (ref >60)
GLUCOSE: 80 mg/dL (ref 65–99)
Potassium: 3.3 mmol/L — ABNORMAL LOW (ref 3.5–5.1)
Sodium: 142 mmol/L (ref 135–145)

## 2017-04-29 LAB — TROPONIN I: Troponin I: <0.03 ng/mL (ref <0.03)

## 2017-04-29 MED ORDER — CLONIDINE HCL 0.1 MG PO TABS
0.1000 mg | ORAL_TABLET | Freq: Once | ORAL | Status: AC
  Administered 2017-04-29: 0.1 mg via ORAL
  Filled 2017-04-29: qty 1

## 2017-04-29 MED ORDER — CLONIDINE HCL 0.1 MG PO TABS: 0.1000 mg | ORAL_TABLET | Freq: Two times a day (BID) | ORAL | 0 refills | Status: DC

## 2017-04-29 NOTE — Discharge Instructions (Signed)
Please call Dr. Etta Quill office in the morning to discuss rescheduling your stress test. Call and schedule an appointment with Dr. Ancil Boozer as well. Return to the emergency department for any symptoms of concern if you are unable to see cardiology or your PCP.

## 2017-04-29 NOTE — ED Triage Notes (Signed)
Pt ambulatory to triage with steady gait, no distress noted. Pt reports she had her BP medication (Valsartin HCT) lowered x1 month ago, per PCP and since has noticed a gradual increase in BP. Pts BP in triage is 224/106. Pt denies chest pain, but reports HA.

## 2017-04-29 NOTE — ED Provider Notes (Signed)
[facility] Emergency Department Provider Note  ____________________________________________   First MD Initiated Contact with Patient 04/29/17 2258     (approximate)  I have reviewed the triage vital signs and the nursing notes.   HISTORY  Chief Complaint Hypertension   HPI Morgan Livingston is a 71 y.o. female who presents to the emergency department for evaluation of hypertension.She states that her valsartan dose was decreased by half approximately one month ago by her primary care provider. Since that time, she has had some palpitations for which Dr. Clayborn Bigness started her on 25 mg of metoprolol every night. This has stopped the palpitations. She states that she has noticed that her blood pressure has continuously crept up since decreasing her dose of valsartan. Tonight, she states that she had a headache and out of curiosity checked her blood pressure which was elevated significantly higher than she had seen it in the past, so she has presented to the emergency department for evaluation. She denies chest pain or shortness of breath. She has had no vision changes. She has had no weakness.   Past Medical History:  Diagnosis Date  . Cancer [facility])    Uterine Cancer  . Hyperlipidemia   . Hypertension     Patient Active Problem List   Diagnosis Date Noted  . History of hysterectomy 05/23/2015  . Chronic kidney disease, stage III (moderate) 05/23/2015  . Benign hypertension 05/22/2015  . Chronic constipation 05/22/2015  . DDD (degenerative disc disease), lumbar 05/22/2015  . DD (diverticular disease) 05/22/2015  . Dyslipidemia 05/22/2015  . History of cervical cancer 05/22/2015  . Helicobacter pylori gastrointestinal tract infection 05/22/2015  . Blood glucose elevated 05/22/2015  . Overweight (BMI 25.0-29.9) 05/22/2015  . Perennial allergic rhinitis with seasonal variation 05/22/2015  . Vitamin D deficiency 05/22/2015  . Impingement syndrome of shoulder  01/14/2015    Past Surgical History:  Procedure Laterality Date  . ABDOMINAL HYSTERECTOMY    . BREAST BIOPSY Right 04/09/2006   negative    Prior to Admission medications   Medication Sig Start Date End Date Taking? Authorizing Provider  aspirin 81 MG tablet Take 81 mg by mouth daily.    [provider]  cholecalciferol (VITAMIN D) 1000 UNITS tablet Take by mouth.    [provider]  cloNIDine (CATAPRES) 0.1 MG tablet Take 1 tablet (0.1 mg total) by mouth 2 (two) times daily. 04/29/17   Nakita Santerre B, FNP  fexofenadine (ALLEGRA) 180 MG tablet Take 180 mg by mouth daily.    [provider]  polyethylene glycol powder (GLYCOLAX/MIRALAX) powder Take 17 g by mouth daily. 01/03/16   Steele Sizer, MD  rosuvastatin (CRESTOR) 20 MG tablet Take 1 tablet (20 mg total) by mouth daily. 01/04/17   Steele Sizer, MD  valsartan-hydrochlorothiazide (DIOVAN-HCT) 160-12.5 MG tablet TAKE 1 TABLET BY MOUTH DAILY. 02/27/17   Steele Sizer, MD    Allergies Patient has no known allergies.  Family History  Problem Relation Age of Onset  . Breast cancer Neg Hx     Social History Social History  Substance Use Topics  . Smoking status: Never Smoker  . Smokeless tobacco: Never Used  . Alcohol use No    Review of Systems  Constitutional: No fever/chills Eyes: No visual changes. ENT: No sore throat. Cardiovascular: Denies chest pain. Respiratory: Denies shortness of breath. Gastrointestinal: No abdominal pain.  No nausea, no vomiting.  No diarrhea.   Genitourinary: Negative for dysuria. Musculoskeletal: Negative for back pain. Skin: Negative for  rash. Neurological: Positive for headaches, negative for focal weakness or numbness. ____________________________________________   PHYSICAL EXAM:  VITAL SIGNS: ED Triage Vitals  Enc Vitals Group     BP 04/29/17 2302 (!) 192/100     Pulse Rate 04/29/17 2302 (!) 103     Resp --      Temp --      Temp src --       SpO2 04/29/17 2302 99 %     Weight 04/29/17 2053 170 lb (77.1 kg)     Height --      Head Circumference --      Peak Flow --      Pain Score --      Pain Loc --      Pain Edu? --      Excl. in [place]? --     Constitutional: Alert and oriented. Well appearing and in no acute distress. Eyes: Conjunctivae are normal. PERRL. EOMI. Head: Atraumatic. Nose: No congestion/rhinnorhea. Mouth/Throat: Mucous membranes are moist.  Oropharynx non-erythematous. Neck: No stridor.   Cardiovascular: Normal rate, regular rhythm. Grossly normal heart sounds.  Good peripheral circulation. Respiratory: Normal respiratory effort.  No retractions. Lungs CTAB. Gastrointestinal: Soft and nontender. No distention. No abdominal bruits. No CVA tenderness. Musculoskeletal: No lower extremity tenderness nor edema.  No joint effusions. Neurologic:  Normal speech and language. No gross focal neurologic deficits are appreciated. No gait instability. Skin:  Skin is warm, dry and intact. No rash noted. Psychiatric: Mood and affect are normal. Speech and behavior are normal. __________________________________________   LABS (all labs ordered are listed, but only abnormal results are displayed)  Labs Reviewed  BASIC METABOLIC PANEL - Abnormal; Notable for the following:       Result Value   Potassium 3.3 (*)    Creatinine, Ser 1.35 (*)    GFR calc non Af Amer 39 (*)    GFR calc Af Amer 45 (*)    All other components within normal limits  CBC - Abnormal; Notable for the following:    Platelets 136 (*)    All other components within normal limits  TROPONIN I   ____________________________________________  EKG  ED ECG REPORT I, Sherrie George, the attending nurse practitioner, personally viewed and interpreted this ECG.  Date: 04/30/2017 EKG Time: 20:56 Rate: 55 Rhythm:sinus bradycardia QRS Axis: normal Intervals: normal ST/T Wave abnormalities: normal Narrative Interpretation:  unremarkable  ____________________________________________  RADIOLOGY  No results found.  ____________________________________________   PROCEDURES  Procedure(s) performed: None  Procedures  Critical Care performed: No  ____________________________________________   INITIAL IMPRESSION / ASSESSMENT AND PLAN / ED COURSE  Pertinent labs & imaging results that were available during my care of the patient were reviewed by me and considered in my medical decision making (see chart for details).  71 year old female presenting to the emergency department for evaluation and treatment of headache associated with hypertension. On review of her basic labs, her GFR and creatinine appears to be near baseline and the remainder of her labs are unremarkable. While here, she was given Clonodine 0.1mg  with gentle reduction of her blood pressure. She felt better and reported a significant decrease in headache. She had no other complaints including chest pain or shortness of breath and will be discharged home to follow up with both her PCP and Dr. Clayborn Bigness. Of note, she had an appointment for a stress test in the AM and was advised to call the office to reschedule due to the late hour of discharge. She  was given strict ER return precautions and discharged home with a prescription for the clonidine. ____________________________________________   FINAL CLINICAL IMPRESSION(S) / ED DIAGNOSES  Final diagnoses:  Hypertension, unspecified type    NEW MEDICATIONS STARTED DURING THIS VISIT:  Discharge Medication List as of 04/29/2017 11:54 PM    START taking these medications   Details  cloNIDine (CATAPRES) 0.1 MG tablet Take 1 tablet (0.1 mg total) by mouth 2 (two) times daily., Starting Mon 04/29/2017, Print         Note:  This document was prepared using Dragon voice recognition software and may include unintentional dictation errors.    Victorino Dike, FNP 04/30/17 Valere Dross,  [place], MD 04/30/17 [phone]

## 2017-05-03 ENCOUNTER — Ambulatory Visit (INDEPENDENT_AMBULATORY_CARE_PROVIDER_SITE_OTHER): Payer: Medicare Other | Admitting: Family Medicine

## 2017-05-03 ENCOUNTER — Encounter: Payer: Self-pay | Admitting: Family Medicine

## 2017-05-03 VITALS — BP 142/96 | HR 68 | Temp 98.1°F | Resp 16 | Ht 65.0 in | Wt 170.6 lb

## 2017-05-03 DIAGNOSIS — E876 Hypokalemia: Secondary | ICD-10-CM

## 2017-05-03 DIAGNOSIS — R944 Abnormal results of kidney function studies: Secondary | ICD-10-CM

## 2017-05-03 DIAGNOSIS — R002 Palpitations: Secondary | ICD-10-CM

## 2017-05-03 DIAGNOSIS — I10 Essential (primary) hypertension: Secondary | ICD-10-CM

## 2017-05-03 LAB — COMPLETE METABOLIC PANEL WITH GFR
ALT: 20 U/L (ref 6–29)
AST: 30 U/L (ref 10–35)
Albumin: 4 g/dL (ref 3.6–5.1)
Alkaline Phosphatase: 63 U/L (ref 33–130)
BUN: 13 mg/dL (ref 7–25)
CHLORIDE: 105 mmol/L (ref 98–110)
CO2: 27 mmol/L (ref 20–31)
Calcium: 9.6 mg/dL (ref 8.6–10.4)
Creat: 1.36 mg/dL — ABNORMAL HIGH (ref 0.50–0.99)
GFR, Est African American: 46 mL/min — ABNORMAL LOW (ref >=60)
GFR, Est Non African American: 40 mL/min — ABNORMAL LOW (ref >=60)
GLUCOSE: 94 mg/dL (ref 65–99)
POTASSIUM: 3.8 mmol/L (ref 3.5–5.3)
SODIUM: 142 mmol/L (ref 135–146)
Total Bilirubin: 0.4 mg/dL (ref 0.2–1.2)
Total Protein: 7 g/dL (ref 6.1–8.1)

## 2017-05-03 MED ORDER — AMLODIPINE BESYLATE 5 MG PO TABS: 5.0000 mg | ORAL_TABLET | Freq: Every day | ORAL | 0 refills | Status: DC

## 2017-05-03 NOTE — Progress Notes (Signed)
Name: Morgan Livingston   MRN: [id number]    DOB: [birth year]   Date:05/03/2017       Progress Note  Subjective  Chief Complaint  Chief Complaint  Patient presents with  . Hypertension    BP elevated since medication change. Seen in ER BP was 234/107    HPI  Uncontrolled HTN: she was seen here March 2018 and bp was low , we changed dose of BP 160/12.5 valsartan/hctz to half pill daily, she developed palpitation about a month ago and she was seen by Raelyn Ensign NP and referral made to Dr. Clayborn Bigness, he added Toprol XL but symptoms of palpitation and she stopped Toprol a few days ago, bp has been very high above 150/90's, but 4 days ago bp spiked very high and went to [facility] , clonidine added to take twice daily but bp is still elevated at home160's-170's/90/'s.  She has headaches, fatigue, dry mouth. She eats healthy and is worried about what is she doing to make bp go up. The only extra stress is going to more funerals of friends and relatives about two a week.   Patient Active Problem List   Diagnosis Date Noted  . History of hysterectomy 05/23/2015  . Chronic kidney disease, stage III (moderate) 05/23/2015  . Benign hypertension 05/22/2015  . Chronic constipation 05/22/2015  . DDD (degenerative disc disease), lumbar 05/22/2015  . DD (diverticular disease) 05/22/2015  . Dyslipidemia 05/22/2015  . History of cervical cancer 05/22/2015  . Helicobacter pylori gastrointestinal tract infection 05/22/2015  . Blood glucose elevated 05/22/2015  . Overweight (BMI 25.0-29.9) 05/22/2015  . Perennial allergic rhinitis with seasonal variation 05/22/2015  . Vitamin D deficiency 05/22/2015  . Impingement syndrome of shoulder 01/14/2015    Past Surgical History:  Procedure Laterality Date  . ABDOMINAL HYSTERECTOMY    . BREAST BIOPSY Right 04/09/2006   negative    Family History  Problem Relation Age of Onset  . Breast cancer Neg Hx     Social History   Social History  . Marital status: Married     Spouse name: N/A  . Number of children: N/A  . Years of education: N/A   Occupational History  . Not on file.   Social History Main Topics  . Smoking status: Never Smoker  . Smokeless tobacco: Never Used  . Alcohol use No  . Drug use: No  . Sexual activity: Yes    Partners: Male   Other Topics Concern  . Not on file   Social History Narrative  . No narrative on file     Current Outpatient Prescriptions:  .  aspirin 81 MG tablet, Take 81 mg by mouth daily., Disp: , Rfl:  .  cholecalciferol (VITAMIN D) 1000 UNITS tablet, Take by mouth., Disp: , Rfl:  .  cloNIDine (CATAPRES) 0.1 MG tablet, Take 1 tablet (0.1 mg total) by mouth 2 (two) times daily., Disp: 14 tablet, Rfl: 0 .  fexofenadine (ALLEGRA) 180 MG tablet, Take 180 mg by mouth daily., Disp: , Rfl:  .  polyethylene glycol powder (GLYCOLAX/MIRALAX) powder, Take 17 g by mouth daily., Disp: 3350 g, Rfl: 0 .  rosuvastatin (CRESTOR) 20 MG tablet, Take 1 tablet (20 mg total) by mouth daily., Disp: 90 tablet, Rfl: 0 .  valsartan-hydrochlorothiazide (DIOVAN-HCT) 160-12.5 MG tablet, TAKE 1 TABLET BY MOUTH DAILY., Disp: 90 tablet, Rfl: 1  No Known Allergies   ROS  Ten systems reviewed and is negative except as mentioned in HPI   Objective  Vitals:  05/03/17 0800  BP: (!) 142/96  Pulse: 68  Resp: 16  Temp: 98.1 F (36.7 C)  TempSrc: Oral  SpO2: 93%  Weight: 170 lb 9.6 oz (77.4 kg)  Height: 5' 5"  (1.651 m)    Body mass index is 28.39 kg/m.  Physical Exam  Constitutional: Patient appears well-developed and well-nourished. Obese  No distress.  HEENT: head atraumatic, normocephalic, pupils equal and reactive to light,  neck supple, throat within normal limits Cardiovascular: Normal rate, regular rhythm and normal heart sounds.  No murmur heard. No BLE edema. Pulmonary/Chest: Effort normal and breath sounds normal. No respiratory distress. Abdominal: Soft.  There is no tenderness. Psychiatric: Patient has a  normal mood and affect. behavior is normal. Judgment and thought content normal.  Recent Results (from the past 2160 hour(s))  Basic metabolic panel     Status: Abnormal   Collection Time: 04/29/17  8:57 PM  Result Value Ref Range   Sodium 142 135 - 145 mmol/L   Potassium 3.3 (L) 3.5 - 5.1 mmol/L   Chloride 107 101 - 111 mmol/L   CO2 29 22 - 32 mmol/L   Glucose, Bld 80 65 - 99 mg/dL   BUN 17 6 - 20 mg/dL   Creatinine, Ser 1.35 (H) 0.44 - 1.00 mg/dL   Calcium 9.4 8.9 - 10.3 mg/dL   GFR calc non Af Amer 39 (L) >60 mL/min   GFR calc Af Amer 45 (L) >60 mL/min    Comment: (NOTE) The eGFR has been calculated using the CKD EPI equation. This calculation has not been validated in all clinical situations. eGFR's persistently <60 mL/min signify possible Chronic Kidney Disease.    Anion gap 6 5 - 15  CBC     Status: Abnormal   Collection Time: 04/29/17  8:57 PM  Result Value Ref Range   WBC 8.6 3.6 - 11.0 K/uL   RBC 4.18 3.80 - 5.20 MIL/uL   Hemoglobin 12.8 12.0 - 16.0 g/dL   HCT 37.5 35.0 - 47.0 %   MCV 89.7 80.0 - 100.0 fL   MCH 30.7 26.0 - 34.0 pg   MCHC 34.3 32.0 - 36.0 g/dL   RDW 14.4 11.5 - 14.5 %   Platelets 136 (L) 150 - 440 K/uL  Troponin I     Status: None   Collection Time: 04/29/17  8:57 PM  Result Value Ref Range   Troponin I <0.03 <0.03 ng/mL      PHQ2/9: Depression screen [facility] 2/9 01/04/2017 07/02/2016 01/03/2016 09/23/2015 05/23/2015  Decreased Interest 0 0 0 0 0  Down, Depressed, Hopeless 0 0 0 0 0  PHQ - 2 Score 0 0 0 0 0     Fall Risk: Fall Risk  01/04/2017 07/02/2016 01/03/2016 09/23/2015 05/23/2015  Falls in the past year? No No No No No     Assessment & Plan  1. Heart palpitations  - Thyroid Panel With TSH  2. Uncontrolled hypertension  We dropped the dose of Valsartan/hctz from 160/12.5 to half pill daily 3 months ago because bp was low, at [facility] she was continue on same dose and added Clonidine bid - that is causing sedation  Also off Toprol given by  Dr. Clayborn Bigness  Because palpitation resolved. We will check labs, stop clonidine and try adding Norvasc 5 mg daily return in one week for follow up - Thyroid Panel With TSH - Parathyroid hormone, intact (no Ca) - COMPLETE METABOLIC PANEL WITH GFR - US Renal Artery Stenosis; Future - amLODipine (NORVASC) 5  MG tablet; Take 1 tablet (5 mg total) by mouth daily.  Dispense: 30 tablet; Refill: 0   3. Decreased calculated GFR  - Thyroid Panel With TSH - Parathyroid hormone, intact (no Ca) - COMPLETE METABOLIC PANEL WITH GFR - VITAMIN D 25 Hydroxy (Vit-D Deficiency, Fractures) - Urine Microalbumin w/creat. ratio  4. Low serum potassium  - Magnesium - COMPLETE METABOLIC PANEL WITH GFR

## 2017-05-04 LAB — MICROALBUMIN / CREATININE URINE RATIO
Creatinine, Urine: 66 mg/dL (ref 20–320)
MICROALB/CREAT RATIO: 35 ug/mg{creat} — AB (ref <30)
Microalb, Ur: 2.3 mg/dL

## 2017-05-04 LAB — THYROID PANEL WITH TSH
Free Thyroxine Index: 2.2 (ref 1.4–3.8)
T3 Uptake: 27 % (ref 22–35)
T4, Total: 8.1 ug/dL (ref 4.5–12.0)
TSH: 1.42 m[IU]/L

## 2017-05-04 LAB — MAGNESIUM: MAGNESIUM: 2.1 mg/dL (ref 1.5–2.5)

## 2017-05-04 LAB — VITAMIN D 25 HYDROXY (VIT D DEFICIENCY, FRACTURES): VIT D 25 HYDROXY: 39 ng/mL (ref 30–100)

## 2017-05-06 LAB — PARATHYROID HORMONE, INTACT (NO CA): PTH: 63 pg/mL (ref 14–64)

## 2017-05-07 ENCOUNTER — Ambulatory Visit
Admission: RE | Admit: 2017-05-07 | Discharge: 2017-05-07 | Disposition: A | Discharge status: Home / Self Care | Payer: Medicare Other | Source: Ambulatory Visit | Attending: Family Medicine | Admitting: Family Medicine

## 2017-05-07 ENCOUNTER — Telehealth: Payer: Self-pay | Admitting: Family Medicine

## 2017-05-07 DIAGNOSIS — I10 Essential (primary) hypertension: Secondary | ICD-10-CM | POA: Diagnosis present

## 2017-05-07 NOTE — Telephone Encounter (Signed)
Explained to pt that her pulse was normal so she would like to know what can be done about her not being able to sleep at night.

## 2017-05-07 NOTE — Telephone Encounter (Signed)
Morgan Livingston SAID SHE NEEDS TO SPEAK WITH SOMEONE ABOUT HER PULSE IS NOW RUNNING HIGH ( 75 TO 9) AND IS NOT SLEEPING AT NIGHT AND SHE CAN FEEL HER HEART BEATING AND IT IS MAKING HER FEEL REAL TIRES. PLEASE GIVE PATIENT A CALL.

## 2017-05-08 NOTE — Telephone Encounter (Signed)
You are correct, normal pulse rate, I wonder if it is a rhythm problem, she may need to have a holter done. Please make sure she is feeling better now

## 2017-05-09 NOTE — Telephone Encounter (Signed)
Left pt a voicemail.

## 2017-05-14 ENCOUNTER — Encounter: Payer: Self-pay | Admitting: Family Medicine

## 2017-05-14 ENCOUNTER — Ambulatory Visit (INDEPENDENT_AMBULATORY_CARE_PROVIDER_SITE_OTHER): Payer: Medicare Other | Admitting: Family Medicine

## 2017-05-14 VITALS — BP 122/68 | HR 78 | Temp 97.9°F | Resp 18 | Ht 65.0 in | Wt 169.0 lb

## 2017-05-14 DIAGNOSIS — Z23 Encounter for immunization: Secondary | ICD-10-CM | POA: Diagnosis not present

## 2017-05-14 DIAGNOSIS — R002 Palpitations: Secondary | ICD-10-CM | POA: Diagnosis not present

## 2017-05-14 DIAGNOSIS — S40811A Abrasion of right upper arm, initial encounter: Secondary | ICD-10-CM | POA: Diagnosis not present

## 2017-05-14 DIAGNOSIS — E785 Hyperlipidemia, unspecified: Secondary | ICD-10-CM | POA: Diagnosis not present

## 2017-05-14 DIAGNOSIS — I10 Essential (primary) hypertension: Secondary | ICD-10-CM

## 2017-05-14 MED ORDER — AMLODIPINE BESYLATE 5 MG PO TABS: 5.0000 mg | ORAL_TABLET | Freq: Every day | ORAL | 0 refills | Status: DC

## 2017-05-14 MED ORDER — ROSUVASTATIN CALCIUM 20 MG PO TABS: 20.0000 mg | ORAL_TABLET | Freq: Every day | ORAL | 0 refills | Status: DC

## 2017-05-14 NOTE — Progress Notes (Signed)
Name: Morgan Livingston   MRN: [id number]    DOB: [birth year]   Date:05/14/2017       Progress Note  Subjective  Chief Complaint  Chief Complaint  Patient presents with  . Palpitations    2 week follow up    HPI  HTN: she went to [facility] with very high bp end of June, and was seen here for follow up last month. She is now on Diovan/hctz and Norvasc, off Clonidine given by EC. She is tolerating medications well, no dizziness or chest pain, but still has palpitations. She will see Dr. Clayborn Bigness tomorrow, discussed possible holter monitor with patient. She states palpitation not as often and not lasting all day, but it happened again 2 days ago for one hour. Reviewed labs with patient, low HDL and microalbuminuria present, also CKI stage III. Discussed referral to nephrologist if worsening of bp or kidney function. BP at home has been 130's/80's  Lipoma: large on right upper arm, never seen by surgeon, she states growing slowly , no pain, but sometimes itches, discussed referral to surgeon but she would like to hold off for now.   Dyslipidemia: reviewed labs, continue statin therapy and aspirin daily, no chest pain or muscle cramps   Abrasion: she noticed yesterday a linear abrasion yesterday, she thinks it happens while cleaning her kitchen, it bled a little, due for Tdap  Patient Active Problem List   Diagnosis Date Noted  . History of hysterectomy 05/23/2015  . Chronic kidney disease, stage III (moderate) 05/23/2015  . Benign hypertension 05/22/2015  . Chronic constipation 05/22/2015  . DDD (degenerative disc disease), lumbar 05/22/2015  . DD (diverticular disease) 05/22/2015  . Dyslipidemia 05/22/2015  . History of cervical cancer 05/22/2015  . Helicobacter pylori gastrointestinal tract infection 05/22/2015  . Blood glucose elevated 05/22/2015  . Overweight (BMI 25.0-29.9) 05/22/2015  . Perennial allergic rhinitis with seasonal variation 05/22/2015  . Vitamin D deficiency 05/22/2015  .  Impingement syndrome of shoulder 01/14/2015    Past Surgical History:  Procedure Laterality Date  . ABDOMINAL HYSTERECTOMY    . BREAST BIOPSY Right 04/09/2006   negative    Family History  Problem Relation Age of Onset  . Breast cancer Neg Hx     Social History   Social History  . Marital status: Married    Spouse name: N/A  . Number of children: N/A  . Years of education: N/A   Occupational History  . Not on file.   Social History Main Topics  . Smoking status: Never Smoker  . Smokeless tobacco: Never Used  . Alcohol use No  . Drug use: No  . Sexual activity: Yes    Partners: Male   Other Topics Concern  . Not on file   Social History Narrative  . No narrative on file     Current Outpatient Prescriptions:  .  amLODipine (NORVASC) 5 MG tablet, Take 1 tablet (5 mg total) by mouth daily., Disp: 90 tablet, Rfl: 0 .  aspirin 81 MG tablet, Take 81 mg by mouth daily., Disp: , Rfl:  .  cholecalciferol (VITAMIN D) 1000 UNITS tablet, Take by mouth., Disp: , Rfl:  .  fexofenadine (ALLEGRA) 180 MG tablet, Take 180 mg by mouth daily., Disp: , Rfl:  .  polyethylene glycol powder (GLYCOLAX/MIRALAX) powder, Take 17 g by mouth daily., Disp: 3350 g, Rfl: 0 .  rosuvastatin (CRESTOR) 20 MG tablet, Take 1 tablet (20 mg total) by mouth daily., Disp: 90 tablet, Rfl: 0 .  valsartan-hydrochlorothiazide (DIOVAN-HCT) 160-12.5 MG tablet, TAKE 1 TABLET BY MOUTH DAILY., Disp: 90 tablet, Rfl: 1  No Known Allergies   ROS  Constitutional: Negative for fever or significant weight change.  Respiratory: Negative for cough and shortness of breath.   Cardiovascular: Negative for chest pain , positive for intermittent palpitations.  Gastrointestinal: Negative for abdominal pain, no bowel changes.  Musculoskeletal: Negative for gait problem or joint swelling.  Skin: Negative for rash.  Neurological: Negative for dizziness or headache.  No other specific complaints in a complete review of  systems (except as listed in HPI above).   Objective  Vitals:   05/14/17 0930  BP: 122/68  Pulse: 78  Resp: 18  Temp: 97.9 F (36.6 C)  SpO2: 96%  Weight: 169 lb (76.7 kg)  Height: _0  (1.651 m)    Body mass index is 28.12 kg/m.  Physical Exam  Constitutional: Patient appears well-developed and well-nourished. Overweight. No distress.  HEENT: head atraumatic, normocephalic, pupils equal and reactive to light, neck supple, throat within normal limits Cardiovascular: Normal rate, regular rhythm and normal heart sounds.  No murmur heard. No BLE edema. Pulmonary/Chest: Effort normal and breath sounds normal. No respiratory distress. Abdominal: Soft.  There is no tenderness. Skin: 1 inc linear cut on right distal arm Psychiatric: Patient has a normal mood and affect. behavior is normal. Judgment and thought content normal.  Recent Results (from the past 2160 hour(s))  Basic metabolic panel     Status: Abnormal   Collection Time: 04/29/17  8:57 PM  Result Value Ref Range   Sodium 142 135 - 145 mmol/L   Potassium 3.3 (L) 3.5 - 5.1 mmol/L   Chloride 107 101 - 111 mmol/L   CO2 29 22 - 32 mmol/L   Glucose, Bld 80 65 - 99 mg/dL   BUN 17 6 - 20 mg/dL   Creatinine, Ser 1.35 (H) 0.44 - 1.00 mg/dL   Calcium 9.4 8.9 - 10.3 mg/dL   GFR calc non Af Amer 39 (L) >60 mL/min   GFR calc Af Amer 45 (L) >60 mL/min    Comment: (NOTE) The eGFR has been calculated using the CKD EPI equation. This calculation has not been validated in all clinical situations. eGFR's persistently <60 mL/min signify possible Chronic Kidney Disease.    Anion gap 6 5 - 15  CBC     Status: Abnormal   Collection Time: 04/29/17  8:57 PM  Result Value Ref Range   WBC 8.6 3.6 - 11.0 K/uL   RBC 4.18 3.80 - 5.20 MIL/uL   Hemoglobin 12.8 12.0 - 16.0 g/dL   HCT 37.5 35.0 - 47.0 %   MCV 89.7 80.0 - 100.0 fL   MCH 30.7 26.0 - 34.0 pg   MCHC 34.3 32.0 - 36.0 g/dL   RDW 14.4 11.5 - 14.5 %   Platelets 136 (L) 150 -  440 K/uL  Troponin I     Status: None   Collection Time: 04/29/17  8:57 PM  Result Value Ref Range   Troponin I <0.03 <0.03 ng/mL  Urine Microalbumin w/creat. ratio     Status: Abnormal   Collection Time: 05/03/17  8:22 AM  Result Value Ref Range   Creatinine, Urine 66 20 - 320 mg/dL   Microalb, Ur 2.3 Not estab mg/dL   Microalb Creat Ratio 35 (H) <30 mcg/mg creat    Comment: The ADA has defined abnormalities in albumin excretion as follows:           Category  Result                            (mcg/mg creatinine)                 Normal:    <30       Microalbuminuria:    30 - 299   Clinical albuminuria:    > or = 300   The ADA recommends that at least two of three specimens collected within a 3 - 6 month period be abnormal before considering a patient to be within a diagnostic category.     Thyroid Panel With TSH     Status: None   Collection Time: 05/03/17  8:49 AM  Result Value Ref Range   T4, Total 8.1 4.5 - 12.0 ug/dL   T3 Uptake 27 22 - 35 %   Free Thyroxine Index 2.2 1.4 - 3.8   TSH 1.42 mIU/L    Comment:   Reference Range   > or = 20 Years  0.40-4.50   Pregnancy Range First trimester  0.26-2.66 Second trimester 0.55-2.73 Third trimester  0.43-2.91     Parathyroid hormone, intact (no Ca)     Status: None   Collection Time: 05/03/17  8:49 AM  Result Value Ref Range   PTH 63 14 - 64 pg/mL    Comment:   Interpretive Guide:                              Intact PTH               Calcium                              ----------               ------- Normal Parathyroid           Normal                   Normal Hypoparathyroidism           Low or Low Normal        Low Hyperparathyroidism      Primary                 Normal or High           High      Secondary               High                     Normal or Low      Tertiary                High                     High Non-Parathyroid   Hypercalcemia              Low or Low Normal        High    Magnesium     Status: None   Collection Time: 05/03/17  8:49 AM  Result Value Ref Range   Magnesium 2.1 1.5 - 2.5 mg/dL  COMPLETE METABOLIC PANEL WITH GFR     Status: Abnormal   Collection Time: 05/03/17  8:49 AM  Result Value Ref Range   Sodium  142 135 - 146 mmol/L   Potassium 3.8 3.5 - 5.3 mmol/L   Chloride 105 98 - 110 mmol/L   CO2 27 20 - 31 mmol/L   Glucose, Bld 94 65 - 99 mg/dL   BUN 13 7 - 25 mg/dL   Creat 1.36 (H) 0.50 - 0.99 mg/dL    Comment:   For patients > or = 70 years of age: The upper reference limit for Creatinine is approximately 13% higher for people identified as African-American.      Total Bilirubin 0.4 0.2 - 1.2 mg/dL   Alkaline Phosphatase 63 33 - 130 U/L   AST 30 10 - 35 U/L   ALT 20 6 - 29 U/L   Total Protein 7.0 6.1 - 8.1 g/dL   Albumin 4.0 3.6 - 5.1 g/dL   Calcium 9.6 8.6 - 10.4 mg/dL   GFR, Est African American 46 (L) >=60 mL/min   GFR, Est Non African American 40 (L) >=60 mL/min  VITAMIN D 25 Hydroxy (Vit-D Deficiency, Fractures)     Status: None   Collection Time: 05/03/17  8:49 AM  Result Value Ref Range   Vit D, 25-Hydroxy 39 30 - 100 ng/mL    Comment: Vitamin D Status           25-OH Vitamin D        Deficiency                <20 ng/mL        Insufficiency         20 - 29 ng/mL        Optimal             > or = 30 ng/mL   For 25-OH Vitamin D testing on patients on D2-supplementation and patients for whom quantitation of D2 and D3 fractions is required, the QuestAssureD 25-OH VIT D, (D2,D3), LC/MS/MS is recommended: order code [id number] (patients > 2 yrs).      PHQ2/9: Depression screen [facility] 2/9 01/04/2017 07/02/2016 01/03/2016 09/23/2015 05/23/2015  Decreased Interest 0 0 0 0 0  Down, Depressed, Hopeless 0 0 0 0 0  PHQ - 2 Score 0 0 0 0 0     Fall Risk: Fall Risk  01/04/2017 07/02/2016 01/03/2016 09/23/2015 05/23/2015  Falls in the past year? _0       Assessment & Plan  1. Essential hypertension  - amLODipine (NORVASC) 5  MG tablet; Take 1 tablet (5 mg total) by mouth daily.  Dispense: 90 tablet; Refill: 0  2. Abrasion of right upper extremity, initial encounter  - Tdap vaccine greater than or equal to 7yo IM  3. Dyslipidemia  - rosuvastatin (CRESTOR) 20 MG tablet; Take 1 tablet (20 mg total) by mouth daily.  Dispense: 90 tablet; Refill: 0  5. Palpitation  Intermittent, and less frequent, can last up to one hour

## 2017-05-30 ENCOUNTER — Other Ambulatory Visit: Payer: Self-pay | Admitting: Family Medicine

## 2017-05-30 DIAGNOSIS — I10 Essential (primary) hypertension: Secondary | ICD-10-CM

## 2017-05-30 NOTE — Telephone Encounter (Signed)
Patient requesting refill of Amlodipine to [facility].  

## 2017-06-03 ENCOUNTER — Other Ambulatory Visit: Payer: Self-pay | Admitting: Family Medicine

## 2017-06-03 DIAGNOSIS — E785 Hyperlipidemia, unspecified: Secondary | ICD-10-CM

## 2017-06-10 ENCOUNTER — Telehealth: Payer: Self-pay | Admitting: Family Medicine

## 2017-06-10 NOTE — Telephone Encounter (Signed)
PT SAID THAT SHE NEEDS TO KNOW WHAT THE DR IS GOING TO PLACE HER ON FOR HER BP MEDS SINCE THERE IS A RECALL ON HER VALSARTAN

## 2017-06-13 ENCOUNTER — Other Ambulatory Visit: Payer: Self-pay | Admitting: Family Medicine

## 2017-06-13 MED ORDER — LOSARTAN POTASSIUM-HCTZ 100-12.5 MG PO TABS: 1.0000 | ORAL_TABLET | Freq: Every day | ORAL | 1 refills | Status: DC

## 2017-06-13 NOTE — Telephone Encounter (Signed)
Changed to Losartan/Hctz 100/12.5

## 2017-07-10 ENCOUNTER — Ambulatory Visit: Payer: Medicare Other | Admitting: Family Medicine

## 2017-08-14 ENCOUNTER — Encounter: Payer: Self-pay | Admitting: Family Medicine

## 2017-08-14 ENCOUNTER — Ambulatory Visit (INDEPENDENT_AMBULATORY_CARE_PROVIDER_SITE_OTHER): Payer: Medicare Other | Admitting: Family Medicine

## 2017-08-14 VITALS — BP 126/74 | HR 63 | Temp 97.9°F | Resp 16 | Ht 65.0 in | Wt 171.3 lb

## 2017-08-14 DIAGNOSIS — J302 Other seasonal allergic rhinitis: Secondary | ICD-10-CM | POA: Diagnosis not present

## 2017-08-14 DIAGNOSIS — E785 Hyperlipidemia, unspecified: Secondary | ICD-10-CM | POA: Diagnosis not present

## 2017-08-14 DIAGNOSIS — R739 Hyperglycemia, unspecified: Secondary | ICD-10-CM

## 2017-08-14 DIAGNOSIS — J3089 Other allergic rhinitis: Secondary | ICD-10-CM | POA: Diagnosis not present

## 2017-08-14 DIAGNOSIS — Z23 Encounter for immunization: Secondary | ICD-10-CM

## 2017-08-14 DIAGNOSIS — N183 Chronic kidney disease, stage 3 unspecified: Secondary | ICD-10-CM

## 2017-08-14 DIAGNOSIS — E663 Overweight: Secondary | ICD-10-CM

## 2017-08-14 DIAGNOSIS — I10 Essential (primary) hypertension: Secondary | ICD-10-CM

## 2017-08-14 MED ORDER — ROSUVASTATIN CALCIUM 20 MG PO TABS: 20.0000 mg | ORAL_TABLET | Freq: Every day | ORAL | 1 refills | Status: DC

## 2017-08-14 MED ORDER — FLUTICASONE PROPIONATE 50 MCG/ACT NA SUSP: 2.0000 | Freq: Every day | NASAL | 0 refills | Status: DC

## 2017-08-14 NOTE — Progress Notes (Signed)
Name: Morgan Livingston   MRN: [id number]    DOB: 1946/01/08   Date:08/14/2017       Progress Note  Subjective  Chief Complaint  Chief Complaint  Patient presents with  . Medication Refill    3 month F/U  . Hypertension    Denies any symptoms, switched medication due to recall.   . Hyperlipidemia  . Allergic Rhinitis     Having alot of sinus drainage for the past 2 weeks    HPI  HTN: she went to [facility] with very high bp end of June, and was seen here for follow up last month. She is now on Losartan/hctz , stopped Norvasc , off Clonidine given by EC. She is tolerating medications well, no dizziness or chest pain, palpitation also resolved. She saw Dr. Clayborn Bigness and had normal echo and myoview stress test. She is feeling well, no longer has symptoms. BP at home has been 118-120/57-70's   Dyslipidemia: reviewed labs, continue statin therapy and aspirin daily, no chest pain or muscle cramps . Recheck yearly  AR: she states over the past couple weeks she has noticed nasal congestion, rhinorrhea, some sneezing, no wheezing or SOB   Patient Active Problem List   Diagnosis Date Noted  . History of hysterectomy 05/23/2015  . Chronic kidney disease, stage III (moderate) ([place]) 05/23/2015  . Benign hypertension 05/22/2015  . Chronic constipation 05/22/2015  . DDD (degenerative disc disease), lumbar 05/22/2015  . DD (diverticular disease) 05/22/2015  . Dyslipidemia 05/22/2015  . History of cervical cancer 05/22/2015  . Helicobacter pylori gastrointestinal tract infection 05/22/2015  . Blood glucose elevated 05/22/2015  . Overweight (BMI 25.0-29.9) 05/22/2015  . Perennial allergic rhinitis with seasonal variation 05/22/2015  . Vitamin D deficiency 05/22/2015  . Impingement syndrome of shoulder 01/14/2015    Past Surgical History:  Procedure Laterality Date  . ABDOMINAL HYSTERECTOMY    . BREAST BIOPSY Right 04/09/2006   negative    Family History  Problem Relation Age of Onset  .  Congestive Heart Failure Mother 16  . Heart attack Father 40  . Stomach cancer Sister   . Dementia Sister        43  . Breast cancer Neg Hx     Social History   Social History  . Marital status: Married    Spouse name: N/A  . Number of children: N/A  . Years of education: N/A   Occupational History  . Not on file.   Social History Main Topics  . Smoking status: Never Smoker  . Smokeless tobacco: Never Used  . Alcohol use No  . Drug use: No  . Sexual activity: Yes    Partners: Male   Other Topics Concern  . Not on file   Social History Narrative  . No narrative on file     Current Outpatient Prescriptions:  .  aspirin 81 MG tablet, Take 81 mg by mouth daily., Disp: , Rfl:  .  cholecalciferol (VITAMIN D) 1000 UNITS tablet, Take by mouth., Disp: , Rfl:  .  fexofenadine (ALLEGRA) 180 MG tablet, Take 180 mg by mouth daily., Disp: , Rfl:  .  losartan-hydrochlorothiazide (HYZAAR) 100-12.5 MG tablet, Take 1 tablet by mouth daily., Disp: 90 tablet, Rfl: 1 .  rosuvastatin (CRESTOR) 20 MG tablet, Take 1 tablet (20 mg total) by mouth daily., Disp: 90 tablet, Rfl: 1  No Known Allergies   ROS  Constitutional: Negative for fever or weight change.  Respiratory: Negative for cough and shortness of breath.  Cardiovascular: Negative for chest pain or palpitations.  Gastrointestinal: Negative for abdominal pain, no bowel changes.  Musculoskeletal: Negative for gait problem or joint swelling.  Skin: Negative for rash.  Neurological: Negative for dizziness or headache.  No other specific complaints in a complete review of systems (except as listed in HPI above).  Objective  Vitals:   08/14/17 0920  BP: 126/74  Pulse: 63  Resp: 16  Temp: 97.9 F (36.6 C)  TempSrc: Oral  SpO2: 97%  Weight: 171 lb 4.8 oz (77.7 kg)  Height: 5\' 5"  (1.651 m)    Body mass index is 28.51 kg/m.  Physical Exam  Constitutional: Patient appears well-developed and well-nourished.  Overweight.  No distress.  HEENT: head atraumatic, normocephalic, pupils equal and reactive to light,  neck supple, throat within normal limits Cardiovascular: Normal rate, regular rhythm and normal heart sounds.  No murmur heard. No BLE edema. Pulmonary/Chest: Effort normal and breath sounds normal. No respiratory distress. Abdominal: Soft.  There is no tenderness. Psychiatric: Patient has a normal mood and affect. behavior is normal. Judgment and thought content normal.   PHQ2/9: Depression screen [facility] 2/9 08/14/2017 01/04/2017 07/02/2016 01/03/2016 09/23/2015  Decreased Interest 0 0 0 0 0  Down, Depressed, Hopeless 0 0 0 0 0  PHQ - 2 Score 0 0 0 0 0     Fall Risk: Fall Risk  08/14/2017 01/04/2017 07/02/2016 01/03/2016 09/23/2015  Falls in the past year? No No No No No    Functional Status Survey: Is the patient deaf or have difficulty hearing?: No Does the patient have difficulty seeing, even when wearing glasses/contacts?: No Does the patient have difficulty concentrating, remembering, or making decisions?: No Does the patient have difficulty walking or climbing stairs?: No Does the patient have difficulty dressing or bathing?: No Does the patient have difficulty doing errands alone such as visiting a doctor's office or shopping?: No   Assessment & Plan  1. Essential hypertension  bp is back to normal, stress is under better control, stopped Norvasc and bp is back to normal   2. Dyslipidemia  - rosuvastatin (CRESTOR) 20 MG tablet; Take 1 tablet (20 mg total) by mouth daily.  Dispense: 90 tablet; Refill: 1  3. Blood glucose elevated  No symptoms of diabetes  4. Overweight (BMI 25.0-29.9)  stable  5. Chronic kidney disease, stage III (moderate) ([place])  Recheck labs yearly   6. Needs flu shot  - Flu vaccine HIGH DOSE PF (Fluzone High dose)   7. Perennial allergic rhinitis with seasonal variation  - fluticasone (FLONASE) 50 MCG/ACT nasal spray; Place 2 sprays into  both nostrils daily.  Dispense: 16 g; Refill: 0

## 2017-08-23 ENCOUNTER — Other Ambulatory Visit: Payer: Self-pay | Admitting: Family Medicine

## 2017-08-23 DIAGNOSIS — I10 Essential (primary) hypertension: Secondary | ICD-10-CM

## 2017-08-28 ENCOUNTER — Ambulatory Visit (INDEPENDENT_AMBULATORY_CARE_PROVIDER_SITE_OTHER): Payer: Medicare Other | Admitting: Family Medicine

## 2017-08-28 ENCOUNTER — Encounter: Payer: Self-pay | Admitting: Family Medicine

## 2017-08-28 VITALS — BP 146/82 | HR 64 | Temp 97.8°F | Resp 16 | Ht 65.0 in | Wt 172.9 lb

## 2017-08-28 DIAGNOSIS — R1031 Right lower quadrant pain: Secondary | ICD-10-CM

## 2017-08-28 DIAGNOSIS — J302 Other seasonal allergic rhinitis: Secondary | ICD-10-CM

## 2017-08-28 DIAGNOSIS — J3089 Other allergic rhinitis: Secondary | ICD-10-CM | POA: Diagnosis not present

## 2017-08-28 DIAGNOSIS — I10 Essential (primary) hypertension: Secondary | ICD-10-CM | POA: Diagnosis not present
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
## 2017-08-29 LAB — COMPLETE METABOLIC PANEL WITH GFR
AG RATIO: 1.3 (calc) (ref 1.0–2.5)
ALBUMIN MSPROF: 4 g/dL (ref 3.6–5.1)
ALKALINE PHOSPHATASE (APISO): 57 U/L (ref 33–130)
ALT: 14 U/L (ref 6–29)
AST: 22 U/L (ref 10–35)
BILIRUBIN TOTAL: 0.5 mg/dL (ref 0.2–1.2)
BUN / CREAT RATIO: 12 (calc) (ref 6–22)
BUN: 14 mg/dL (ref 7–25)
CHLORIDE: 105 mmol/L (ref 98–110)
CO2: 31 mmol/L (ref 20–32)
Calcium: 9.4 mg/dL (ref 8.6–10.4)
Creat: 1.16 mg/dL — ABNORMAL HIGH (ref 0.50–0.99)
GFR, Est African American: 56 mL/min/{1.73_m2} — ABNORMAL LOW (ref >=60)
GFR, Est Non African American: 48 mL/min/{1.73_m2} — ABNORMAL LOW (ref >=60)
GLUCOSE: 85 mg/dL (ref 65–99)
Globulin: 3 g/dL (calc) (ref 1.9–3.7)
POTASSIUM: 4.2 mmol/L (ref 3.5–5.3)
Sodium: 140 mmol/L (ref 135–146)
Total Protein: 7 g/dL (ref 6.1–8.1)

## 2017-08-29 LAB — CBC WITH DIFFERENTIAL/PLATELET
BASOS ABS: 30 {cells}/uL (ref 0–200)
Basophils Relative: 0.4 %
EOS ABS: 61 {cells}/uL (ref 15–500)
Eosinophils Relative: 0.8 %
HCT: 36.4 % (ref 35.0–45.0)
Hemoglobin: 12.1 g/dL (ref 11.7–15.5)
Lymphs Abs: 3055 cells/uL (ref 850–3900)
MCH: 30.3 pg (ref 27.0–33.0)
MCHC: 33.2 g/dL (ref 32.0–36.0)
MCV: 91 fL (ref 80.0–100.0)
MONOS PCT: 8.5 %
MPV: 12.3 fL (ref 7.5–12.5)
Neutro Abs: 3808 cells/uL (ref 1500–7800)
Neutrophils Relative %: 50.1 %
PLATELETS: 149 10*3/uL (ref 140–400)
RBC: 4 10*6/uL (ref 3.80–5.10)
RDW: 14.3 % (ref 11.0–15.0)
TOTAL LYMPHOCYTE: 40.2 %
WBC mixed population: 646 cells/uL (ref 200–950)
WBC: 7.6 10*3/uL (ref 3.8–10.8)

## 2017-08-29 LAB — SEDIMENTATION RATE: SED RATE: 46 mm/h — AB (ref 0–30)

## 2017-08-29 LAB — C-REACTIVE PROTEIN: CRP: 1 mg/L (ref <8.0)

## 2017-10-23 ENCOUNTER — Other Ambulatory Visit: Payer: Self-pay | Admitting: Family Medicine

## 2017-10-23 DIAGNOSIS — Z1231 Encounter for screening mammogram for malignant neoplasm of breast: Secondary | ICD-10-CM

## 2017-11-22 ENCOUNTER — Ambulatory Visit
Admission: RE | Admit: 2017-11-22 | Discharge: 2017-11-22 | Disposition: A | Discharge status: Home / Self Care | Payer: Medicare Other | Source: Ambulatory Visit | Attending: Family Medicine | Admitting: Family Medicine

## 2017-11-22 ENCOUNTER — Other Ambulatory Visit: Payer: Self-pay | Admitting: Family Medicine

## 2017-11-22 DIAGNOSIS — R921 Mammographic calcification found on diagnostic imaging of breast: Secondary | ICD-10-CM

## 2017-11-22 DIAGNOSIS — Z1231 Encounter for screening mammogram for malignant neoplasm of breast: Secondary | ICD-10-CM | POA: Diagnosis present

## 2017-11-22 DIAGNOSIS — R928 Other abnormal and inconclusive findings on diagnostic imaging of breast: Secondary | ICD-10-CM

## 2017-11-28 ENCOUNTER — Other Ambulatory Visit: Payer: Self-pay | Admitting: Family Medicine

## 2017-11-28 ENCOUNTER — Ambulatory Visit
Admission: RE | Admit: 2017-11-28 | Discharge: 2017-11-28 | Disposition: A | Discharge status: Home / Self Care | Payer: PRIVATE HEALTH INSURANCE | Source: Ambulatory Visit | Attending: Family Medicine | Admitting: Family Medicine

## 2017-11-28 DIAGNOSIS — R928 Other abnormal and inconclusive findings on diagnostic imaging of breast: Secondary | ICD-10-CM | POA: Insufficient documentation

## 2017-11-28 DIAGNOSIS — R921 Mammographic calcification found on diagnostic imaging of breast: Secondary | ICD-10-CM | POA: Insufficient documentation

## 2017-11-28 NOTE — Telephone Encounter (Signed)
Refill request for Hypertension medication:  Losartan-HCTZ 100-12.5 mg  Last office visit pertaining to hypertension: 08/28/2017  BP Readings from Last 3 Encounters:  08/28/17 (!) 146/82  08/14/17 126/74  05/14/17 122/68     Lab Results  Component Value Date   CREATININE 1.16 (H) 08/28/2017   BUN 14 08/28/2017   NA 140 08/28/2017   K 4.2 08/28/2017   CL 105 08/28/2017   CO2 31 08/28/2017   Follow-up on file. 01/22/2018

## 2017-11-29 ENCOUNTER — Other Ambulatory Visit: Payer: Self-pay | Admitting: Family Medicine

## 2017-11-29 DIAGNOSIS — R921 Mammographic calcification found on diagnostic imaging of breast: Secondary | ICD-10-CM

## 2017-11-29 DIAGNOSIS — R928 Other abnormal and inconclusive findings on diagnostic imaging of breast: Secondary | ICD-10-CM

## 2017-12-04 ENCOUNTER — Ambulatory Visit
Admission: RE | Admit: 2017-12-04 | Discharge: 2017-12-04 | Disposition: A | Discharge status: Home / Self Care | Payer: PRIVATE HEALTH INSURANCE | Source: Ambulatory Visit | Attending: Family Medicine | Admitting: Family Medicine

## 2017-12-04 DIAGNOSIS — R921 Mammographic calcification found on diagnostic imaging of breast: Secondary | ICD-10-CM | POA: Insufficient documentation

## 2017-12-04 DIAGNOSIS — R928 Other abnormal and inconclusive findings on diagnostic imaging of breast: Secondary | ICD-10-CM

## 2017-12-04 HISTORY — PX: BREAST BIOPSY: SHX20

## 2017-12-05 LAB — SURGICAL PATHOLOGY

## 2017-12-20 ENCOUNTER — Ambulatory Visit (INDEPENDENT_AMBULATORY_CARE_PROVIDER_SITE_OTHER): Payer: Medicare Other

## 2017-12-20 VITALS — BP 138/70 | HR 60 | Temp 98.3°F | Resp 12 | Ht 65.0 in | Wt 171.4 lb

## 2017-12-20 DIAGNOSIS — Z0000 Encounter for general adult medical examination without abnormal findings: Secondary | ICD-10-CM

## 2017-12-20 NOTE — Progress Notes (Signed)
Subjective:   Morgan Livingston is a 72 y.o. female who presents for Medicare Annual (Subsequent) preventive examination.  Review of Systems:  N/A Cardiac Risk Factors include: advanced age (>65men, >67 women);dyslipidemia;hypertension     Objective:     Vitals: BP 138/70 (BP Location: Left Arm, Patient Position: Sitting, Cuff Size: Normal)   Pulse 60   Temp 98.3 F (36.8 C) (Oral)   Resp 12   Ht 5\' 5"  (1.651 m)   Wt 171 lb 6.4 oz (77.7 kg)   SpO2 96%   BMI 28.52 kg/m   Body mass index is 28.52 kg/m.  Advanced Directives 12/20/2017 08/14/2017 05/14/2017 05/03/2017 04/05/2017 01/04/2017 07/02/2016  Does Patient Have a Medical Advance Directive? No No No No No No No  Would patient like information on creating a medical advance directive? Yes (MAU/Ambulatory/Procedural Areas - Information given) - - - - - No - patient declined information    Tobacco Social History   Tobacco Use  Smoking Status Former Smoker  . Packs/day: 1.00  . Years: 2.00  . Pack years: 2.00  . Types: Cigarettes  . Last attempt to quit: 1963  . Years since quitting: 56.1  Smokeless Tobacco Never Used  Tobacco Comment   smoking cessation materials not required     Counseling given: No Comment: smoking cessation materials not required   Clinical Intake:  Pre-visit preparation completed: Yes  Pain : No/denies pain   BMI - recorded: 28.52 Nutritional Status: BMI 25 -29 Overweight Nutritional Risks: None Diabetes: No  How often do you need to have someone help you when you read instructions, pamphlets, or other written materials from your doctor or pharmacy?: 1 - Never  Interpreter Needed?: No  Information entered by :: AEversole, LPN  Past Medical History:  Diagnosis Date  . Cancer [facility])    Uterine Cancer  . Hyperlipidemia   . Hypertension    Past Surgical History:  Procedure Laterality Date  . ABDOMINAL HYSTERECTOMY    . BREAST BIOPSY Right 04/09/2006   negative  . BREAST BIOPSY Left  12/04/2017   stereo path pending for calcs   Family History  Problem Relation Age of Onset  . Congestive Heart Failure Mother 69  . Heart attack Father 58  . Sinusitis Sister   . Stomach cancer Sister   . Dementia Sister        43  . Breast cancer Neg Hx    Social History   Socioeconomic History  . Marital status: Married    Spouse name: Paramedic  . Number of children: 2  . Years of education: some college  . Highest education level: 12th grade  Social Needs  . Financial resource strain: Not hard at all  . Food insecurity - worry: Never true  . Food insecurity - inability: Never true  . Transportation needs - medical: No  . Transportation needs - non-medical: No  Occupational History    Employer: Retired   Tobacco Use  . Smoking status: Former Smoker    Packs/day: 1.00    Years: 2.00    Pack years: 2.00    Types: Cigarettes    Last attempt to quit: 1963    Years since quitting: 56.1  . Smokeless tobacco: Never Used  . Tobacco comment: smoking cessation materials not required  Substance and Sexual Activity  . Alcohol use: No    Alcohol/week: 0.0 oz  . Drug use: No  . Sexual activity: Not Currently    Partners: Male  Other  Topics Concern  . None  Social History Narrative  . None    Outpatient Encounter Medications as of 12/20/2017  Medication Sig  . aspirin 81 MG tablet Take 81 mg by mouth daily.  . cholecalciferol (VITAMIN D) 1000 UNITS tablet Take by mouth.  . fexofenadine (ALLEGRA) 180 MG tablet Take 180 mg by mouth daily.  . fluticasone (FLONASE) 50 MCG/ACT nasal spray Place 2 sprays into both nostrils daily.  Marland Kitchen losartan-hydrochlorothiazide (HYZAAR) 100-12.5 MG tablet TAKE 1 TABLET BY MOUTH EVERY DAY  . rosuvastatin (CRESTOR) 20 MG tablet Take 1 tablet (20 mg total) by mouth daily.   No facility-administered encounter medications on file as of 12/20/2017.     Activities of Daily Living In your present state of health, do you have any difficulty  performing the following activities: 12/20/2017 08/14/2017  Hearing? N N  Comment denies hearing aids -  Vision? N N  Comment wears eyeglasses -  Difficulty concentrating or making decisions? N N  Walking or climbing stairs? N N  Dressing or bathing? N N  Doing errands, shopping? N N  Preparing Food and eating ? N -  Comment partial dentures upper and lower -  Using the Toilet? N -  In the past six months, have you accidently leaked urine? N -  Do you have problems with loss of bowel control? N -  Managing your Medications? N -  Managing your Finances? N -  Housekeeping or managing your Housekeeping? N -  Some recent data might be hidden    Patient Care Team: Steele Sizer, MD as PCP - General (Family Medicine)    Assessment:   This is a routine wellness examination for [place].  Exercise Activities and Dietary recommendations Current Exercise Habits: Home exercise routine, Type of exercise: walking, Time (Minutes): 30, Frequency (Times/Week): 7, Weekly Exercise (Minutes/Week): 210, Intensity: Mild, Exercise limited by: None identified  Goals    . DIET - INCREASE WATER INTAKE     Recommend to drink at least 6-8 8oz glasses of water per day.       Fall Risk Fall Risk  12/20/2017 08/14/2017 01/04/2017 07/02/2016 01/03/2016  Falls in the past year? No No No No No   Is the patient's home free of loose throw rugs in walkways, pet beds, electrical cords, etc?   Yes Does the patient have any grab bars in the bathroom? No  Does the patient use a shower chair when bathing? No Does the patient have any stairs in or around the home? Yes If so, are there any handrails?  Yes Does the patient have adequate lighting?  Yes Does the patient use a cane, walker or w/c? No Does the patient use of an elevated toilet seat? No  Timed Get Up and Go Performed: Yes. Pt ambulated 10 feet within 10 sec. Gait stead-fast and without the use of an assistive device. No intervention required at this time.  Fall risk prevention has been discussed.  Pt declined my offer to send Community Resource Referral to Care Guide for installation of grab bars in the shower, shower chair or an elevated toilet seat.  Depression Screen PHQ 2/9 Scores 12/20/2017 08/14/2017 01/04/2017 07/02/2016  PHQ - 2 Score 0 0 0 0     Cognitive Function     6CIT Screen 12/20/2017  What Year? 0 points  What month? 0 points  What time? 0 points  Count back from 20 0 points  Months in reverse 0 points  Repeat phrase 0 points  Total Score 0    Immunization History  Administered Date(s) Administered  . Influenza, High Dose Seasonal PF 09/23/2015, 08/22/2016, 08/14/2017  . Influenza-Unspecified 09/20/2014  . Pneumococcal Conjugate-13 01/12/2014  . Pneumococcal Polysaccharide-23 12/28/2014  . Tdap 05/14/2017  . Tetanus 02/25/2007  . Zoster 08/11/2012    Qualifies for Shingles Vaccine? Yes. Zostavax completed 08/11/12. Due for Shingrix vaccine. Education has been provided regarding the importance of this vaccine. Pt has been advised to call her insurance company to determine her out of pocket expense. Advised she may also receive this vaccine at her local pharmacy or Health Dept. Verbalized acceptance and understanding.  Screening Tests Health Maintenance  Topic Date Due  . MAMMOGRAM  11/28/2018  . COLONOSCOPY  11/05/2021  . TETANUS/TDAP  05/15/2027  . INFLUENZA VACCINE  Completed  . DEXA SCAN  Completed  . Hepatitis C Screening  Completed  . PNA vac Low Risk Adult  Completed    Cancer Screenings: Lung: Low Dose CT Chest recommended if Age 22-80 years, 30 pack-year currently smoking OR have quit w/in 15years. Patient does not qualify. Breast:  Up to date on Mammogram? Yes. Completed 11/28/17. Repeat every year.  Up to date of Bone Density/Dexa? Yes. Completed 07/06/10. Osteoporotic screenings no longer required. Colorectal: Completed colonoscopy 11/06/11. Repeat every 10 years.  Additional Screenings: Hepatitis  B/HIV/Syphillis: Does not qualify Hepatitis C Screening: Completed 07/14/12     Plan:  I have personally reviewed and addressed the Medicare Annual Wellness questionnaire and have noted the following in the patient's chart:  A. Medical and social history B. Use of alcohol, tobacco or illicit drugs  C. Current medications and supplements D. Functional ability and status E.  Nutritional status F.  Physical activity G. Advance directives and Code Status: H. List of other physicians I.  Hospitalizations, surgeries, and ER visits in previous 12 months J.  [place] such as hearing and vision if needed, cognitive and depression L. Referrals and appointments - none  In addition, I have reviewed and discussed with patient certain preventive protocols, quality metrics, and best practice recommendations. A written personalized care plan for preventive services as well as general preventive health recommendations were provided to patient.  See attached scanned questionnaire for additional information.   Signed,  Aleatha Borer, LPN Nurse Health Advisor  I have reviewed this encounter including the documentation in this note and/or discussed this patient with the provider, Aleatha Borer, LPN. I am certifying that I agree with the content of this note as supervising physician.  Steele Sizer, MD [facility] 12/20/2017, 4:07 PM

## 2017-12-20 NOTE — Patient Instructions (Signed)
[name] , Thank you for taking time to come for your Medicare Wellness Visit. I appreciate your ongoing commitment to your health goals. Please review the following plan we discussed and let me know if I can assist you in the future.   Screening recommendations/referrals: Colorectal Screening: Colonoscopy completed 11/06/11. Repeat every 10 years. Mammogram: Completed 11/28/17. Repeat every year. Bone Density: Completed 07/06/10. Osteoporotic screening no longer required Lung Cancer Screening: You do not qualify for this screening Hepatitis C Screening: Completed 07/14/12  Vision/Dental Exams: Recommended yearly ophthalmology/optometry visit for glaucoma screening and checkup Recommended yearly dental visit for hygiene and checkup  Vaccinations: Influenza vaccine: Completed 08/14/17 Pneumococcal vaccine: Completed PCV13 01/12/14 and PPSV23 12/28/14 Tdap vaccine: Completed 05/14/17 Shingles vaccine: Please call your insurance company to determine your out of pocket expense for the Shingrix vaccine. You may also receive this vaccine at your local pharmacy or Health Dept.   Advanced directives: Advance directive discussed with you today. I have provided a copy for you to complete at home and have notarized. Once this is complete please bring a copy in to our office so we can scan it into your chart.  Conditions/risks identified: Recommend to drink at least 6-8 8oz glasses of water per day.  Next appointment: You are scheduled to see Dr. Ancil Boozer on 01/22/18 @ 8:20am.   Please schedule your Annual Wellness Visit with your Nurse Health Advisor in one year.  Preventive Care 68 Years and Older, Female Preventive care refers to lifestyle choices and visits with your health care provider that can promote health and wellness. What does preventive care include?  A yearly physical exam. This is also called an annual well check.  Dental exams once or twice a year.  Routine eye exams. Ask your health  care provider how often you should have your eyes checked.  Personal lifestyle choices, including:  Daily care of your teeth and gums.  Regular physical activity.  Eating a healthy diet.  Avoiding tobacco and drug use.  Limiting alcohol use.  Practicing safe sex.  Taking low-dose aspirin every day.  Taking vitamin and mineral supplements as recommended by your health care provider. What happens during an annual well check? The services and screenings done by your health care provider during your annual well check will depend on your age, overall health, lifestyle risk factors, and family history of disease. Counseling  Your health care provider may ask you questions about your:  Alcohol use.  Tobacco use.  Drug use.  Emotional well-being.  Home and relationship well-being.  Sexual activity.  Eating habits.  History of falls.  Memory and ability to understand (cognition).  Work and work Statistician.  Reproductive health. Screening  You may have the following tests or measurements:  Height, weight, and BMI.  Blood pressure.  Lipid and cholesterol levels. These may be checked every 5 years, or more frequently if you are over 73 years old.  Skin check.  Lung cancer screening. You may have this screening every year starting at age 64 if you have a 30-pack-year history of smoking and currently smoke or have quit within the past 15 years.  Fecal occult blood test (FOBT) of the stool. You may have this test every year starting at age 71.  Flexible sigmoidoscopy or colonoscopy. You may have a sigmoidoscopy every 5 years or a colonoscopy every 10 years starting at age 27.  Hepatitis C blood test.  Hepatitis B blood test.  Sexually transmitted disease (STD) testing.  Diabetes screening.  This is done by checking your blood sugar (glucose) after you have not eaten for a while (fasting). You may have this done every 1-3 years.  Bone density scan. This is done  to screen for osteoporosis. You may have this done starting at age 50.  Mammogram. This may be done every 1-2 years. Talk to your health care provider about how often you should have regular mammograms. Talk with your health care provider about your test results, treatment options, and if necessary, the need for more tests. Vaccines  Your health care provider may recommend certain vaccines, such as:  Influenza vaccine. This is recommended every year.  Tetanus, diphtheria, and acellular pertussis (Tdap, Td) vaccine. You may need a Td booster every 10 years.  Zoster vaccine. You may need this after age 70.  Pneumococcal 13-valent conjugate (PCV13) vaccine. One dose is recommended after age 67.  Pneumococcal polysaccharide (PPSV23) vaccine. One dose is recommended after age 61. Talk to your health care provider about which screenings and vaccines you need and how often you need them. This information is not intended to replace advice given to you by your health care provider. Make sure you discuss any questions you have with your health care provider. Document Released: 11/18/2015 Document Revised: 07/11/2016 Document Reviewed: 08/23/2015 Elsevier Interactive Patient Education  2017 [place] Prevention in the Home Falls can cause injuries. They can happen to people of all ages. There are many things you can do to make your home safe and to help prevent falls. What can I do on the outside of my home?  Regularly fix the edges of walkways and driveways and fix any cracks.  Remove anything that might make you trip as you walk through a door, such as a raised step or threshold.  Trim any bushes or trees on the path to your home.  Use bright outdoor lighting.  Clear any walking paths of anything that might make someone trip, such as rocks or tools.  Regularly check to see if handrails are loose or broken. Make sure that both sides of any steps have handrails.  Any raised decks  and porches should have guardrails on the edges.  Have any leaves, snow, or ice cleared regularly.  Use sand or salt on walking paths during winter.  Clean up any spills in your garage right away. This includes oil or grease spills. What can I do in the bathroom?  Use night lights.  Install grab bars by the toilet and in the tub and shower. Do not use towel bars as grab bars.  Use non-skid mats or decals in the tub or shower.  If you need to sit down in the shower, use a plastic, non-slip stool.  Keep the floor dry. Clean up any water that spills on the floor as soon as it happens.  Remove soap buildup in the tub or shower regularly.  Attach bath mats securely with double-sided non-slip rug tape.  Do not have throw rugs and other things on the floor that can make you trip. What can I do in the bedroom?  Use night lights.  Make sure that you have a light by your bed that is easy to reach.  Do not use any sheets or blankets that are too big for your bed. They should not hang down onto the floor.  Have a firm chair that has side arms. You can use this for support while you get dressed.  Do not have throw rugs and  other things on the floor that can make you trip. What can I do in the kitchen?  Clean up any spills right away.  Avoid walking on wet floors.  Keep items that you use a lot in easy-to-reach places.  If you need to reach something above you, use a strong step stool that has a grab bar.  Keep electrical cords out of the way.  Do not use floor polish or wax that makes floors slippery. If you must use wax, use non-skid floor wax.  Do not have throw rugs and other things on the floor that can make you trip. What can I do with my stairs?  Do not leave any items on the stairs.  Make sure that there are handrails on both sides of the stairs and use them. Fix handrails that are broken or loose. Make sure that handrails are as long as the stairways.  Check any  carpeting to make sure that it is firmly attached to the stairs. Fix any carpet that is loose or worn.  Avoid having throw rugs at the top or bottom of the stairs. If you do have throw rugs, attach them to the floor with carpet tape.  Make sure that you have a light switch at the top of the stairs and the bottom of the stairs. If you do not have them, ask someone to add them for you. What else can I do to help prevent falls?  Wear shoes that:  Do not have high heels.  Have rubber bottoms.  Are comfortable and fit you well.  Are closed at the toe. Do not wear sandals.  If you use a stepladder:  Make sure that it is fully opened. Do not climb a closed stepladder.  Make sure that both sides of the stepladder are locked into place.  Ask someone to hold it for you, if possible.  Clearly mark and make sure that you can see:  Any grab bars or handrails.  First and last steps.  Where the edge of each step is.  Use tools that help you move around (mobility aids) if they are needed. These include:  Canes.  Walkers.  Scooters.  Crutches.  Turn on the lights when you go into a dark area. Replace any light bulbs as soon as they burn out.  Set up your furniture so you have a clear path. Avoid moving your furniture around.  If any of your floors are uneven, fix them.  If there are any pets around you, be aware of where they are.  Review your medicines with your doctor. Some medicines can make you feel dizzy. This can increase your chance of falling. Ask your doctor what other things that you can do to help prevent falls. This information is not intended to replace advice given to you by your health care provider. Make sure you discuss any questions you have with your health care provider. Document Released: 08/18/2009 Document Revised: 03/29/2016 Document Reviewed: 11/26/2014 Elsevier Interactive Patient Education  2017 [employer].

## 2018-01-22 ENCOUNTER — Encounter: Payer: Self-pay | Admitting: Family Medicine

## 2018-01-22 ENCOUNTER — Ambulatory Visit (INDEPENDENT_AMBULATORY_CARE_PROVIDER_SITE_OTHER): Payer: Medicare Other | Admitting: Family Medicine

## 2018-01-22 VITALS — BP 130/70 | HR 77 | Temp 98.1°F | Resp 14 | Ht 64.57 in | Wt 171.7 lb

## 2018-01-22 DIAGNOSIS — Z90710 Acquired absence of both cervix and uterus: Secondary | ICD-10-CM

## 2018-01-22 DIAGNOSIS — R928 Other abnormal and inconclusive findings on diagnostic imaging of breast: Secondary | ICD-10-CM

## 2018-01-22 DIAGNOSIS — N183 Chronic kidney disease, stage 3 unspecified: Secondary | ICD-10-CM

## 2018-01-22 DIAGNOSIS — R29890 Loss of height: Secondary | ICD-10-CM

## 2018-01-22 DIAGNOSIS — E785 Hyperlipidemia, unspecified: Secondary | ICD-10-CM | POA: Diagnosis not present

## 2018-01-22 DIAGNOSIS — E663 Overweight: Secondary | ICD-10-CM

## 2018-01-22 DIAGNOSIS — E559 Vitamin D deficiency, unspecified: Secondary | ICD-10-CM | POA: Diagnosis not present

## 2018-01-22 DIAGNOSIS — Z0000 Encounter for general adult medical examination without abnormal findings: Secondary | ICD-10-CM | POA: Diagnosis not present

## 2018-01-22 DIAGNOSIS — E2839 Other primary ovarian failure: Secondary | ICD-10-CM | POA: Diagnosis not present

## 2018-01-22 DIAGNOSIS — R700 Elevated erythrocyte sedimentation rate: Secondary | ICD-10-CM | POA: Diagnosis not present

## 2018-01-22 DIAGNOSIS — I10 Essential (primary) hypertension: Secondary | ICD-10-CM | POA: Diagnosis not present

## 2018-01-22 DIAGNOSIS — R739 Hyperglycemia, unspecified: Secondary | ICD-10-CM | POA: Diagnosis not present

## 2018-01-22 MED ORDER — ROSUVASTATIN CALCIUM 20 MG PO TABS: 20.0000 mg | ORAL_TABLET | Freq: Every day | ORAL | 1 refills | Status: DC

## 2018-01-22 MED ORDER — LOSARTAN POTASSIUM-HCTZ 100-12.5 MG PO TABS: 1.0000 | ORAL_TABLET | Freq: Every day | ORAL | 1 refills | Status: DC

## 2018-01-22 NOTE — Progress Notes (Signed)
Name: Morgan Livingston   MRN: [id number]    DOB: Nov 12, 1945   Date:01/22/2018       Progress Note  Subjective  Chief Complaint  Chief Complaint  Patient presents with  . Annual Exam    HPI  Well Woman: sexually active occasionally with husband, reviewed medicare well done 12/2017, discussed importance of following a diabetic diet.   HTN: taking medication, bp has been well controlled, no more spikes in bp since last Fall. She denies chest pain or palpitation. She is checking bp at home and is around 130-80's.   Dyslipidemia: reviewed labs, continue statin therapy and aspirin daily, no chest pain or muscle cramps . Recheck today  Hyperglycemia: last hgbA1C was elevated at 6.2%, she still likes sweets, but is trying to replace with fruit, discussed importance of avoiding starches. Continue walking. We will recheck labs. Denies polyphagia, polydipsia or polyuria.   CKI stage III: not seen by nephrologist yet, we will recheck labs and if levels not stable we will make referral. No itching or decrease in urinary output.   Patient Active Problem List   Diagnosis Date Noted  . History of hysterectomy 05/23/2015  . Chronic kidney disease, stage III (moderate) ([place]) 05/23/2015  . Benign hypertension 05/22/2015  . Chronic constipation 05/22/2015  . DDD (degenerative disc disease), lumbar 05/22/2015  . DD (diverticular disease) 05/22/2015  . Dyslipidemia 05/22/2015  . History of cervical cancer 05/22/2015  . Helicobacter pylori gastrointestinal tract infection 05/22/2015  . Blood glucose elevated 05/22/2015  . Overweight (BMI 25.0-29.9) 05/22/2015  . Perennial allergic rhinitis with seasonal variation 05/22/2015  . Vitamin D deficiency 05/22/2015  . Impingement syndrome of shoulder 01/14/2015    Past Surgical History:  Procedure Laterality Date  . ABDOMINAL HYSTERECTOMY    . BREAST BIOPSY Right 04/09/2006   negative  . BREAST BIOPSY Left 12/04/2017   stereo path pending for calcs     Family History  Problem Relation Age of Onset  . Congestive Heart Failure Mother 82  . Heart attack Father 67  . Sinusitis Sister   . Stomach cancer Sister   . Dementia Sister        92  . Breast cancer Neg Hx     Social History   Socioeconomic History  . Marital status: Married    Spouse name: Paramedic  . Number of children: 2  . Years of education: some college  . Highest education level: 12th grade  Social Needs  . Financial resource strain: Not hard at all  . Food insecurity - worry: Never true  . Food insecurity - inability: Never true  . Transportation needs - medical: No  . Transportation needs - non-medical: No  Occupational History  . 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 Smoking status: Former Smoker    Packs/day: 1.00    Years: 2.00    Pack years: 2.00    Types: Cigarettes    Last attempt to quit: 1963    Years since quitting: 56.2  . Smokeless tobacco: Never Used  . Tobacco comment: smoking cessation materials not required  Substance and Sexual Activity  . Alcohol use: No    Alcohol/week: 0.0 oz  . Drug use: No  . Sexual activity: Yes    Partners: Male    Birth control/protection: Post-menopausal  Other Topics Concern  . Not on file  Social History Narrative   Married for 50 years      Current Outpatient  Medications:  .  aspirin 81 MG tablet, Take 81 mg by mouth daily., Disp: , Rfl:  .  cholecalciferol (VITAMIN D) 1000 UNITS tablet, Take by mouth., Disp: , Rfl:  .  fexofenadine (ALLEGRA) 180 MG tablet, Take 180 mg by mouth daily., Disp: , Rfl:  .  fluticasone (FLONASE) 50 MCG/ACT nasal spray, Place 2 sprays into both nostrils daily., Disp: 16 g, Rfl: 0 .  losartan-hydrochlorothiazide (HYZAAR) 100-12.5 MG tablet, TAKE 1 TABLET BY MOUTH EVERY DAY, Disp: 90 tablet, Rfl: 0 .  rosuvastatin (CRESTOR) 20 MG tablet, Take 1 tablet (20 mg total) by mouth daily., Disp: 90 tablet, Rfl: 1  No Known  Allergies   ROS  Constitutional: Negative for fever or weight change.  Respiratory: Negative for cough and shortness of breath.   Cardiovascular: Negative for chest pain or palpitations.  Gastrointestinal: Negative for abdominal pain, no bowel changes.  Musculoskeletal: Negative for gait problem or joint swelling.  Skin: Negative for rash.  Neurological: Negative for dizziness or headache.  No other specific complaints in a complete review of systems (except as listed in HPI above).  Objective  Vitals:   01/22/18 0827  BP: 130/70  Pulse: 77  Resp: 14  Temp: 98.1 F (36.7 C)  TempSrc: Oral  SpO2: 96%  Weight: 171 lb 11.2 oz (77.9 kg)  Height: 5' 4.57" (1.64 m)    Body mass index is 28.96 kg/m.  Physical Exam  Constitutional: Patient appears well-developed and well-nourished. No distress.  HENT: Head: Normocephalic and atraumatic. Ears: B TMs ok, no erythema or effusion; Nose: Nose normal. Mouth/Throat: Oropharynx is clear and moist. No oropharyngeal exudate.  Eyes: Conjunctivae and EOM are normal. Pupils are equal, round, and reactive to light. No scleral icterus.  Neck: Normal range of motion. Neck supple. No JVD present. No thyromegaly present.  Cardiovascular: Normal rate, regular rhythm and normal heart sounds.  No murmur heard. No BLE edema. Pulmonary/Chest: Effort normal and breath sounds normal. No respiratory distress. Abdominal: Soft. Bowel sounds are normal, no distension. There is no tenderness. no masses Breast: no lumps or masses, no nipple discharge or rashes FEMALE GENITALIA:  Not done RECTAL: not done Musculoskeletal: Normal range of motion, no joint effusions. No gross deformities Neurological: he is alert and oriented to person, place, and time. No cranial nerve deficit. Coordination, balance, strength, speech and gait are normal.  Skin: Skin is warm and dry. No rash noted. No erythema.  Psychiatric: Patient has a normal mood and affect. behavior is  normal. Judgment and thought content normal.   Recent Results (from the past 2160 hour(s))  Surgical pathology     Status: None   Collection Time: 12/04/17  8:35 AM  Result Value Ref Range   SURGICAL PATHOLOGY      Surgical Pathology CASE: [id number] PATIENT: Arthelia Rondon Surgical Pathology Report     SPECIMEN SUBMITTED: A. Breast, left cental  CLINICAL HISTORY: Calcifications  PRE-OPERATIVE DIAGNOSIS: DCIS vs FCC  POST-OPERATIVE DIAGNOSIS: None provided.     DIAGNOSIS: A. BREAST, LEFT CENTRAL; STEREOTACTIC BIOPSY: - FIBROADENOMATOID CHANGE WITH COARSE CALCIFICATIONS. - NEGATIVE FOR ATYPIA AND MALIGNANCY.   GROSS DESCRIPTION:  A. The specimen is received in a formalin-filled [place] collection device labeled with the patient's name and left breast central calcification.  Core pieces: multiple Measurement: aggregate, 3.2 x 1.8 x 0.2 cm Comments: yellow lobulated fibrofatty, marked blue Accompanying specimen radiograph: yes, sectioned E, F and G  Entirely submitted in cassette(s):  1-section E 2-section F 3-section G 4-remaining  tissue  Time/Date in fixative: collected at 827 and placed in formalin at 829 AM on 12/04/2017 Total fixation time: 8.5  hours Final Diagnosis performed by Quay Burow, MD.  Electronically signed 12/05/2017 3:49:51PM    The electronic signature indicates that the named Attending Pathologist has evaluated the specimen  Technical component performed at [facility], [address], [place], [place] [address] Lab: [phone] Dir: Rush Farmer, MD, MMM  Professional component performed at [facility], [facility], [place], [place], [place] [address] Lab: [phone] Dir: Dellia Nims. Rubinas, MD        PHQ2/9: Depression screen [facility] 2/9 01/22/2018 01/22/2018 12/20/2017 08/14/2017 01/04/2017  Decreased Interest 0 0 0 0 0  Down, Depressed, Hopeless 0 0 0 0 0  PHQ - 2 Score 0 0 0 0 0     Fall  Risk: Fall Risk  01/22/2018 12/20/2017 08/14/2017 01/04/2017 07/02/2016  Falls in the past year? No No No No No     Functional Status Survey: Is the patient deaf or have difficulty hearing?: No Does the patient have difficulty seeing, even when wearing glasses/contacts?: No Does the patient have difficulty concentrating, remembering, or making decisions?: No Does the patient have difficulty walking or climbing stairs?: No Does the patient have difficulty dressing or bathing?: No Does the patient have difficulty doing errands alone such as visiting a doctor's office or shopping?: No   Assessment & Plan  1. Adult general medical exam  Discussed importance of 150 minutes of physical activity weekly, eat two servings of fish weekly, eat one serving of tree nuts ( cashews, pistachios, pecans, almonds.Marland Kitchen) every other day, eat 6 servings of fruit/vegetables daily and drink plenty of water and avoid sweet beverages.   2. Dyslipidemia  - rosuvastatin (CRESTOR) 20 MG tablet; Take 1 tablet (20 mg total) by mouth daily.  Dispense: 90 tablet; Refill: 1 - Lipid panel  3. Blood glucose elevated  - Hemoglobin A1c  4. Essential hypertension  - losartan-hydrochlorothiazide (HYZAAR) 100-12.5 MG tablet; Take 1 tablet by mouth daily.  Dispense: 90 tablet; Refill: 1 - COMPLETE METABOLIC PANEL WITH GFR - CBC with Differential/Platelet  5. Chronic kidney disease, stage III (moderate) (HCC)  - COMPLETE METABOLIC PANEL WITH GFR - CBC with Differential/Platelet - VITAMIN D 25 Hydroxy (Vit-D Deficiency, Fractures) - Parathyroid hormone, intact (no Ca) - Urine Microalbumin w/creat. ratio - Phosphorus  6. Overweight (BMI 25.0-29.9)   7. History of hysterectomy   8. Vitamin D deficiency  - VITAMIN D 25 Hydroxy (Vit-D Deficiency, Fractures)  9. Elevated sed rate  - C-reactive protein - Sedimentation rate  10. Abnormal mammogram of left breast  - MM Digital Diagnostic Unilat L; Future - US  BREAST LTD UNI LEFT INC AXILLA; Future  11. Height loss  - DG Bone Density; Future  12. Ovarian failure  - DG Bone Density; Future

## 2018-01-22 NOTE — Patient Instructions (Signed)
Preventive Care 65 Years and Older, Female Preventive care refers to lifestyle choices and visits with your health care provider that can promote health and wellness. What does preventive care include?  A yearly physical exam. This is also called an annual well check.  Dental exams once or twice a year.  Routine eye exams. Ask your health care provider how often you should have your eyes checked.  Personal lifestyle choices, including: ? Daily care of your teeth and gums. ? Regular physical activity. ? Eating a healthy diet. ? Avoiding tobacco and drug use. ? Limiting alcohol use. ? Practicing safe sex. ? Taking low-dose aspirin every day. ? Taking vitamin and mineral supplements as recommended by your health care provider. What happens during an annual well check? The services and screenings done by your health care provider during your annual well check will depend on your age, overall health, lifestyle risk factors, and family history of disease. Counseling Your health care provider may ask you questions about your:  Alcohol use.  Tobacco use.  Drug use.  Emotional well-being.  Home and relationship well-being.  Sexual activity.  Eating habits.  History of falls.  Memory and ability to understand (cognition).  Work and work environment.  Reproductive health.  Screening You may have the following tests or measurements:  Height, weight, and BMI.  Blood pressure.  Lipid and cholesterol levels. These may be checked every 5 years, or more frequently if you are over 50 years old.  Skin check.  Lung cancer screening. You may have this screening every year starting at age 55 if you have a 30-pack-year history of smoking and currently smoke or have quit within the past 15 years.  Fecal occult blood test (FOBT) of the stool. You may have this test every year starting at age 50.  Flexible sigmoidoscopy or colonoscopy. You may have a sigmoidoscopy every 5 years or  a colonoscopy every 10 years starting at age 50.  Hepatitis C blood test.  Hepatitis B blood test.  Sexually transmitted disease (STD) testing.  Diabetes screening. This is done by checking your blood sugar (glucose) after you have not eaten for a while (fasting). You may have this done every 1-3 years.  Bone density scan. This is done to screen for osteoporosis. You may have this done starting at age 65.  Mammogram. This may be done every 1-2 years. Talk to your health care provider about how often you should have regular mammograms.  Talk with your health care provider about your test results, treatment options, and if necessary, the need for more tests. Vaccines Your health care provider may recommend certain vaccines, such as:  Influenza vaccine. This is recommended every year.  Tetanus, diphtheria, and acellular pertussis (Tdap, Td) vaccine. You may need a Td booster every 10 years.  Varicella vaccine. You may need this if you have not been vaccinated.  Zoster vaccine. You may need this after age 60.  Measles, mumps, and rubella (MMR) vaccine. You may need at least one dose of MMR if you were born in 1957 or later. You may also need a second dose.  Pneumococcal 13-valent conjugate (PCV13) vaccine. One dose is recommended after age 65.  Pneumococcal polysaccharide (PPSV23) vaccine. One dose is recommended after age 65.  Meningococcal vaccine. You may need this if you have certain conditions.  Hepatitis A vaccine. You may need this if you have certain conditions or if you travel or work in places where you may be exposed to hepatitis   A.  Hepatitis B vaccine. You may need this if you have certain conditions or if you travel or work in places where you may be exposed to hepatitis B.  Haemophilus influenzae type b (Hib) vaccine. You may need this if you have certain conditions.  Talk to your health care provider about which screenings and vaccines you need and how often you  need them. This information is not intended to replace advice given to you by your health care provider. Make sure you discuss any questions you have with your health care provider. Document Released: 11/18/2015 Document Revised: 07/11/2016 Document Reviewed: 08/23/2015 Elsevier Interactive Patient Education  2018 [employer]  

## 2018-01-23 ENCOUNTER — Other Ambulatory Visit: Payer: Self-pay | Admitting: Family Medicine

## 2018-01-23 DIAGNOSIS — N183 Chronic kidney disease, stage 3 unspecified: Secondary | ICD-10-CM

## 2018-01-23 LAB — COMPLETE METABOLIC PANEL WITH GFR
AG RATIO: 1.3 (calc) (ref 1.0–2.5)
ALKALINE PHOSPHATASE (APISO): 66 U/L (ref 33–130)
ALT: 15 U/L (ref 6–29)
AST: 24 U/L (ref 10–35)
Albumin: 4.3 g/dL (ref 3.6–5.1)
BILIRUBIN TOTAL: 0.5 mg/dL (ref 0.2–1.2)
BUN/Creatinine Ratio: 12 (calc) (ref 6–22)
BUN: 15 mg/dL (ref 7–25)
CHLORIDE: 103 mmol/L (ref 98–110)
CO2: 32 mmol/L (ref 20–32)
Calcium: 9.7 mg/dL (ref 8.6–10.4)
Creat: 1.28 mg/dL — ABNORMAL HIGH (ref 0.60–0.93)
GFR, EST AFRICAN AMERICAN: 49 mL/min/{1.73_m2} — AB (ref >=60)
GFR, Est Non African American: 42 mL/min/{1.73_m2} — ABNORMAL LOW (ref >=60)
Globulin: 3.2 g/dL (calc) (ref 1.9–3.7)
Glucose, Bld: 95 mg/dL (ref 65–99)
POTASSIUM: 3.8 mmol/L (ref 3.5–5.3)
Sodium: 140 mmol/L (ref 135–146)
TOTAL PROTEIN: 7.5 g/dL (ref 6.1–8.1)

## 2018-01-23 LAB — CBC WITH DIFFERENTIAL/PLATELET
BASOS ABS: 20 {cells}/uL (ref 0–200)
Basophils Relative: 0.3 %
EOS PCT: 1.2 %
Eosinophils Absolute: 78 cells/uL (ref 15–500)
HCT: 37.7 % (ref 35.0–45.0)
Hemoglobin: 12.8 g/dL (ref 11.7–15.5)
LYMPHS ABS: 2314 {cells}/uL (ref 850–3900)
MCH: 30.9 pg (ref 27.0–33.0)
MCHC: 34 g/dL (ref 32.0–36.0)
MCV: 91.1 fL (ref 80.0–100.0)
MONOS PCT: 7.7 %
MPV: 12.3 fL (ref 7.5–12.5)
NEUTROS PCT: 55.2 %
Neutro Abs: 3588 cells/uL (ref 1500–7800)
Platelets: 151 10*3/uL (ref 140–400)
RBC: 4.14 10*6/uL (ref 3.80–5.10)
RDW: 13.4 % (ref 11.0–15.0)
Total Lymphocyte: 35.6 %
WBC mixed population: 501 cells/uL (ref 200–950)
WBC: 6.5 10*3/uL (ref 3.8–10.8)

## 2018-01-23 LAB — VITAMIN D 25 HYDROXY (VIT D DEFICIENCY, FRACTURES): VIT D 25 HYDROXY: 44 ng/mL (ref 30–100)

## 2018-01-23 LAB — PARATHYROID HORMONE, INTACT (NO CA): PTH: 64 pg/mL (ref 14–64)

## 2018-01-23 LAB — LIPID PANEL
CHOLESTEROL: 207 mg/dL — AB (ref <200)
HDL: 49 mg/dL — AB (ref >50)
LDL Cholesterol (Calc): 133 mg/dL (calc) — ABNORMAL HIGH
Non-HDL Cholesterol (Calc): 158 mg/dL (calc) — ABNORMAL HIGH (ref <130)
Total CHOL/HDL Ratio: 4.2 (calc) (ref <5.0)
Triglycerides: 134 mg/dL (ref <150)

## 2018-01-23 LAB — MICROALBUMIN / CREATININE URINE RATIO
CREATININE, URINE: 51 mg/dL (ref 20–275)
Microalb Creat Ratio: 20 mcg/mg creat (ref <30)
Microalb, Ur: 1 mg/dL

## 2018-01-23 LAB — SEDIMENTATION RATE: Sed Rate: 51 mm/h — ABNORMAL HIGH (ref 0–30)

## 2018-01-23 LAB — HEMOGLOBIN A1C
Hgb A1c MFr Bld: 6.2 % of total Hgb — ABNORMAL HIGH (ref <5.7)
MEAN PLASMA GLUCOSE: 131 (calc)
eAG (mmol/L): 7.3 (calc)

## 2018-01-23 LAB — C-REACTIVE PROTEIN: CRP: 0.9 mg/L (ref <8.0)

## 2018-01-23 LAB — PHOSPHORUS: PHOSPHORUS: 3.5 mg/dL (ref 2.1–4.3)

## 2018-02-06 ENCOUNTER — Other Ambulatory Visit: Payer: Self-pay | Admitting: Family Medicine

## 2018-02-06 DIAGNOSIS — E785 Hyperlipidemia, unspecified: Secondary | ICD-10-CM

## 2018-04-02 DIAGNOSIS — H04123 Dry eye syndrome of bilateral lacrimal glands: Secondary | ICD-10-CM | POA: Insufficient documentation

## 2018-04-02 DIAGNOSIS — R768 Other specified abnormal immunological findings in serum: Secondary | ICD-10-CM | POA: Insufficient documentation

## 2018-04-07 ENCOUNTER — Other Ambulatory Visit: Payer: Self-pay | Admitting: Nephrology

## 2018-04-07 ENCOUNTER — Ambulatory Visit
Admission: RE | Admit: 2018-04-07 | Discharge: 2018-04-07 | Disposition: A | Discharge status: Home / Self Care | Payer: Medicare Other | Source: Ambulatory Visit | Attending: Nephrology | Admitting: Nephrology

## 2018-04-07 DIAGNOSIS — N183 Chronic kidney disease, stage 3 unspecified: Secondary | ICD-10-CM

## 2018-04-16 ENCOUNTER — Encounter: Payer: Self-pay | Admitting: Oncology

## 2018-04-16 ENCOUNTER — Inpatient Hospital Stay: Payer: Medicare Other | Attending: Oncology | Admitting: Oncology

## 2018-04-16 ENCOUNTER — Inpatient Hospital Stay: Payer: Medicare Other

## 2018-04-16 ENCOUNTER — Other Ambulatory Visit: Payer: Self-pay

## 2018-04-16 VITALS — BP 158/83 | HR 66 | Temp 96.5°F | Resp 18 | Ht 65.0 in | Wt 172.1 lb

## 2018-04-16 DIAGNOSIS — I129 Hypertensive chronic kidney disease with stage 1 through stage 4 chronic kidney disease, or unspecified chronic kidney disease: Secondary | ICD-10-CM | POA: Insufficient documentation

## 2018-04-16 DIAGNOSIS — Z7982 Long term (current) use of aspirin: Secondary | ICD-10-CM | POA: Insufficient documentation

## 2018-04-16 DIAGNOSIS — Z79899 Other long term (current) drug therapy: Secondary | ICD-10-CM | POA: Diagnosis not present

## 2018-04-16 DIAGNOSIS — Z87891 Personal history of nicotine dependence: Secondary | ICD-10-CM | POA: Insufficient documentation

## 2018-04-16 DIAGNOSIS — N189 Chronic kidney disease, unspecified: Secondary | ICD-10-CM

## 2018-04-16 DIAGNOSIS — R809 Proteinuria, unspecified: Secondary | ICD-10-CM

## 2018-04-16 LAB — COMPREHENSIVE METABOLIC PANEL
ALBUMIN: 3.8 g/dL (ref 3.5–5.0)
ALK PHOS: 63 U/L (ref 38–126)
ALT: 19 U/L (ref 14–54)
AST: 27 U/L (ref 15–41)
Anion gap: 7 (ref 5–15)
BILIRUBIN TOTAL: 0.6 mg/dL (ref 0.3–1.2)
BUN: 17 mg/dL (ref 6–20)
CALCIUM: 9.8 mg/dL (ref 8.9–10.3)
CO2: 29 mmol/L (ref 22–32)
CREATININE: 1.28 mg/dL — AB (ref 0.44–1.00)
Chloride: 105 mmol/L (ref 101–111)
GFR calc Af Amer: 48 mL/min — ABNORMAL LOW (ref >60)
GFR calc non Af Amer: 41 mL/min — ABNORMAL LOW (ref >60)
Glucose, Bld: 129 mg/dL — ABNORMAL HIGH (ref 65–99)
Potassium: 4.1 mmol/L (ref 3.5–5.1)
Sodium: 141 mmol/L (ref 135–145)
TOTAL PROTEIN: 7.7 g/dL (ref 6.5–8.1)

## 2018-04-16 LAB — CBC WITH DIFFERENTIAL/PLATELET
BASOS ABS: 0 10*3/uL (ref 0–0.1)
BASOS PCT: 0 %
Eosinophils Absolute: 0.1 10*3/uL (ref 0–0.7)
Eosinophils Relative: 1 %
HEMATOCRIT: 37.8 % (ref 35.0–47.0)
HEMOGLOBIN: 12.9 g/dL (ref 12.0–16.0)
Lymphocytes Relative: 33 %
Lymphs Abs: 2 10*3/uL (ref 1.0–3.6)
MCH: 31.8 pg (ref 26.0–34.0)
MCHC: 34.1 g/dL (ref 32.0–36.0)
MCV: 93.2 fL (ref 80.0–100.0)
Monocytes Absolute: 0.5 10*3/uL (ref 0.2–0.9)
Monocytes Relative: 8 %
NEUTROS ABS: 3.5 10*3/uL (ref 1.4–6.5)
NEUTROS PCT: 58 %
Platelets: 145 10*3/uL — ABNORMAL LOW (ref 150–440)
RBC: 4.05 MIL/uL (ref 3.80–5.20)
RDW: 14.7 % — ABNORMAL HIGH (ref 11.5–14.5)
WBC: 6.1 10*3/uL (ref 3.6–11.0)

## 2018-04-16 NOTE — Progress Notes (Signed)
Patient here as a new patient

## 2018-04-16 NOTE — Progress Notes (Signed)
Hematology/Oncology Consult note [facility] [phone] [phone]   Patient Care Team: Steele Sizer, MD as PCP - General (Family Medicine)  REFERRING PROVIDER: Dr.Lateef CHIEF COMPLAINTS/REASON FOR VISIT:  Evaluation of abnormal UPEP  HISTORY OF PRESENTING ILLNESS:  Morgan Livingston is a  72 y.o.  female with PMH listed below who was referred to me for evaluation of abnormal UPEP.   She has CKD and follows up with Dr.Lateef. History of HTN. Reports doing well, denies bone pain.  Fatigue:  Chronic onset, perisistent, no aggravating or improving factors, no associated symptoms.  Reviewed labs which was done at [facility] office.  03/05/2018 SPEP done at central [place] showed negative for M Spike UPEP: M spike 16.4  Review of Systems  Constitutional: Positive for malaise/fatigue. Negative for chills, fever and weight loss.  HENT: Negative for congestion, ear discharge, ear pain, nosebleeds, sinus pain and sore throat.   Eyes: Negative for double vision, photophobia, pain, discharge and redness.  Respiratory: Negative for cough, hemoptysis, sputum production, shortness of breath and wheezing.   Cardiovascular: Negative for chest pain, palpitations, orthopnea, claudication and leg swelling.  Gastrointestinal: Negative for abdominal pain, blood in stool, constipation, diarrhea, heartburn, melena, nausea and vomiting.  Genitourinary: Negative for dysuria, flank pain, frequency and hematuria.  Musculoskeletal: Negative for back pain, myalgias and neck pain.  Skin: Negative for itching and rash.  Neurological: Negative for dizziness, tingling, tremors, focal weakness, weakness and headaches.  Endo/Heme/Allergies: Negative for environmental allergies. Does not bruise/bleed easily.  Psychiatric/Behavioral: Negative for depression and hallucinations. The patient is not nervous/anxious.     MEDICAL HISTORY:  Past Medical  History:  Diagnosis Date  . Cancer [facility])    Uterine Cancer  . Hyperlipidemia   . Hypertension     SURGICAL HISTORY: Past Surgical History:  Procedure Laterality Date  . ABDOMINAL HYSTERECTOMY    . BREAST BIOPSY Right 04/09/2006   negative  . BREAST BIOPSY Left 12/04/2017   stereo path pending for calcs    SOCIAL HISTORY: Social History   Socioeconomic History  . Marital status: Married    Spouse name: Paramedic  . Number of children: 2  . Years of education: some college  . Highest education level: 12th grade  Occupational History  . 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 Financial resource strain: Not hard at all  . Food insecurity:    Worry: Never true    Inability: Never true  . Transportation needs:    Medical: No    Non-medical: No  Tobacco Use  . Smoking status: Former Smoker    Packs/day: 1.00    Years: 2.00    Pack years: 2.00    Types: Cigarettes    Last attempt to quit: 1963    Years since quitting: 56.4  . Smokeless tobacco: Never Used  . Tobacco comment: smoking cessation materials not required  Substance and Sexual Activity  . Alcohol use: No    Alcohol/week: 0.0 oz  . Drug use: No  . Sexual activity: Yes    Partners: Male    Birth control/protection: Post-menopausal  Lifestyle  . Physical activity:    Days per week: 7 days    Minutes per session: 30 min  . Stress: Not at all  Relationships  . Social connections:    Talks on phone: More than three times a week    Gets together: More than three times a week  Attends religious service: More than 4 times per year    Active member of club or organization: Yes    Attends meetings of clubs or organizations: More than 4 times per year    Relationship status: Married  . Intimate partner violence:    Fear of current or ex partner: No    Emotionally abused: No    Physically abused: No    Forced sexual activity: No  Other Topics Concern  . Not on file  Social  History Narrative   Married for 47 years     FAMILY HISTORY: Family History  Problem Relation Age of Onset  . Congestive Heart Failure Mother 76  . Heart attack Father 44  . Sinusitis Sister   . Stomach cancer Sister   . Dementia Sister        17  . Breast cancer Neg Hx     ALLERGIES:  has No Known Allergies.  MEDICATIONS:  Current Outpatient Medications  Medication Sig Dispense Refill  . aspirin 81 MG tablet Take 81 mg by mouth daily.    . cholecalciferol (VITAMIN D) 1000 UNITS tablet Take by mouth.    . fexofenadine (ALLEGRA) 180 MG tablet Take 180 mg by mouth daily.    . fluticasone (FLONASE) 50 MCG/ACT nasal spray Place 2 sprays into both nostrils daily. 16 g 0  . losartan-hydrochlorothiazide (HYZAAR) 100-12.5 MG tablet Take 1 tablet by mouth daily. 90 tablet 1  . rosuvastatin (CRESTOR) 20 MG tablet Take 1 tablet (20 mg total) by mouth daily. 90 tablet 1   No current facility-administered medications for this visit.      PHYSICAL EXAMINATION: ECOG PERFORMANCE STATUS: 0 - Asymptomatic Vitals:   04/16/18 0943  BP: (!) 158/83  Pulse: 66  Resp: 18  Temp: (!) 96.5 F (35.8 C)  SpO2: 100%   Filed Weights   04/16/18 0943  Weight: 172 lb 2 oz (78.1 kg)    Physical Exam  Constitutional: She is oriented to person, place, and time. She appears well-developed and well-nourished. No distress.  HENT:  Head: Normocephalic and atraumatic.  Right Ear: External ear normal.  Left Ear: External ear normal.  Mouth/Throat: Oropharynx is clear and moist.  Eyes: Pupils are equal, round, and reactive to light. EOM are normal. No scleral icterus.  Neck: Normal range of motion. Neck supple.  Cardiovascular: Normal rate, regular rhythm and normal heart sounds.  Pulmonary/Chest: Effort normal and breath sounds normal. No respiratory distress. She has no wheezes. She has no rales. She exhibits no tenderness.  Abdominal: Soft. Bowel sounds are normal. She exhibits no distension and  no mass. There is no tenderness.  Musculoskeletal: Normal range of motion. She exhibits no edema or deformity.  Lymphadenopathy:    She has no cervical adenopathy.  Neurological: She is alert and oriented to person, place, and time. No cranial nerve deficit. Coordination normal.  Skin: Skin is warm and dry. No rash noted.  Psychiatric: She has a normal mood and affect. Her behavior is normal.     LABORATORY DATA:  I have reviewed the data as listed Lab Results  Component Value Date   WBC 6.5 01/22/2018   HGB 12.8 01/22/2018   HCT 37.7 01/22/2018   MCV 91.1 01/22/2018   PLT 151 01/22/2018   Recent Labs    05/03/17 0849 08/28/17 1242 01/22/18 0952  NA 142 140 140  K 3.8 4.2 3.8  CL 105 105 103  CO2 27 31 32  GLUCOSE 94 85 95  BUN 13 14 15   CREATININE 1.36* 1.16* 1.28*  CALCIUM 9.6 9.4 9.7  GFRNONAA 40* 48* 42*  GFRAA 46* 56* 49*  PROT 7.0 7.0 7.5  ALBUMIN 4.0  --   --   AST 30 22 24   ALT 20 14 15   ALKPHOS 63  --   --   BILITOT 0.4 0.5 0.5   Iron/TIBC/Ferritin/ %Sat    Component Value Date/Time   IRON 76 05/23/2015 0914   TIBC 276 05/23/2015 0914   FERRITIN 57 05/23/2015 0914   IRONPCTSAT 28 05/23/2015 0914       ASSESSMENT & PLAN:  1. Chronic kidney disease, unspecified CKD stage   2. Proteinuria, unspecified type   Reviewed patient labs that was done at nephrologist office.  It shows that her SPEP is negative for M spike.  UPEP is positive for M spike.  Discussed with patient that she may have plasma cell dyscrasia. We will check a CBC, CMP, multiple myeloma SPEP, immunofixation, immunoglobulin fixation, free light chain ratio, UPEP and urine immunofixation. Patient appears completely asymptomatic for now.  Advised patient to follow-up in 3 weeks to discuss about lab results and further management plan.  Orders Placed This Encounter  Procedures  . CBC with Differential/Platelet    Standing Status:   Future    Number of Occurrences:   1    Standing  Expiration Date:   04/17/2019  . Comprehensive metabolic panel    Standing Status:   Future    Number of Occurrences:   1    Standing Expiration Date:   04/17/2019  . Kappa/lambda light chains    Standing Status:   Future    Number of Occurrences:   1    Standing Expiration Date:   04/17/2019  . 24 hr Ur UPEP/UIFE/Light Chains/TP    Standing Status:   Future    Standing Expiration Date:   04/17/2019  . Multiple Myeloma Panel (SPEP&IFE w/QIG)    Standing Status:   Future    Number of Occurrences:   1    Standing Expiration Date:   04/17/2019    All questions were answered. The patient knows to call the clinic with any problems questions or concerns.  Return of visit: 2 to 3 weeks. Thank you for this kind referral and the opportunity to participate in the care of this patient. A copy of today's note is routed to referring provider   Earlie Server, MD, PhD Hematology Oncology [facility] at [facility] Pager- [phone] 04/16/2018

## 2018-04-17 DIAGNOSIS — N189 Chronic kidney disease, unspecified: Secondary | ICD-10-CM | POA: Diagnosis not present

## 2018-04-17 LAB — MULTIPLE MYELOMA PANEL, SERUM
ALBUMIN/GLOB SERPL: 1.1 (ref 0.7–1.7)
ALPHA 1: 0.2 g/dL (ref 0.0–0.4)
ALPHA2 GLOB SERPL ELPH-MCNC: 0.5 g/dL (ref 0.4–1.0)
Albumin SerPl Elph-Mcnc: 3.5 g/dL (ref 2.9–4.4)
B-GLOBULIN SERPL ELPH-MCNC: 1.2 g/dL (ref 0.7–1.3)
GAMMA GLOB SERPL ELPH-MCNC: 1.4 g/dL (ref 0.4–1.8)
GLOBULIN, TOTAL: 3.4 g/dL (ref 2.2–3.9)
IGA: 465 mg/dL — AB (ref 87–352)
IGG (IMMUNOGLOBIN G), SERUM: 1341 mg/dL (ref 700–1600)
IgM (Immunoglobulin M), Srm: 225 mg/dL — ABNORMAL HIGH (ref 26–217)
Total Protein ELP: 6.9 g/dL (ref 6.0–8.5)

## 2018-04-17 LAB — KAPPA/LAMBDA LIGHT CHAINS
Kappa free light chain: 50.8 mg/L — ABNORMAL HIGH (ref 3.3–19.4)
Kappa, lambda light chain ratio: 1.63 (ref 0.26–1.65)
Lambda free light chains: 31.2 mg/L — ABNORMAL HIGH (ref 5.7–26.3)

## 2018-04-18 ENCOUNTER — Other Ambulatory Visit: Payer: Self-pay

## 2018-04-18 DIAGNOSIS — N189 Chronic kidney disease, unspecified: Secondary | ICD-10-CM

## 2018-04-21 LAB — UPEP/UIFE/LIGHT CHAINS/TP, 24-HR UR
% BETA, URINE: 36.7 %
ALBUMIN, U: 13.6 %
ALPHA 1 URINE: 3 %
ALPHA 2 UR: 24.5 %
FREE LAMBDA LT CHAINS, UR: 26.7 mg/L — AB (ref 0.24–6.66)
Free Kappa Lt Chains,Ur: 554 mg/L — ABNORMAL HIGH (ref 1.35–24.19)
Free Kappa/Lambda Ratio: 20.75 — ABNORMAL HIGH (ref 2.04–10.37)
GAMMA GLOBULIN URINE: 22.1 %
TOTAL PROTEIN, URINE-UPE24: 17 mg/dL
TOTAL PROTEIN, URINE-UR/DAY: 221 mg/(24.h) — AB (ref 30–150)
TOTAL VOLUME: 1300

## 2018-05-09 ENCOUNTER — Inpatient Hospital Stay: Payer: Medicare Other | Attending: Oncology | Admitting: Oncology

## 2018-05-09 ENCOUNTER — Encounter: Payer: Self-pay | Admitting: Oncology

## 2018-05-09 VITALS — BP 145/86 | HR 71 | Temp 97.4°F | Resp 20 | Wt 170.8 lb

## 2018-05-09 DIAGNOSIS — Z79899 Other long term (current) drug therapy: Secondary | ICD-10-CM | POA: Insufficient documentation

## 2018-05-09 DIAGNOSIS — R809 Proteinuria, unspecified: Secondary | ICD-10-CM | POA: Diagnosis not present

## 2018-05-09 DIAGNOSIS — D696 Thrombocytopenia, unspecified: Secondary | ICD-10-CM | POA: Insufficient documentation

## 2018-05-09 DIAGNOSIS — I129 Hypertensive chronic kidney disease with stage 1 through stage 4 chronic kidney disease, or unspecified chronic kidney disease: Secondary | ICD-10-CM | POA: Insufficient documentation

## 2018-05-09 DIAGNOSIS — N189 Chronic kidney disease, unspecified: Secondary | ICD-10-CM

## 2018-05-09 DIAGNOSIS — I10 Essential (primary) hypertension: Secondary | ICD-10-CM

## 2018-05-09 NOTE — Progress Notes (Signed)
Hematology/Oncology Consult note [facility] [phone] Fax:([phone]   Patient Care Team: Steele Sizer, MD as PCP - General (Family Medicine)  REFERRING PROVIDER: Dr.Lateef REASON FOR VISIT Follow up for management of abnormal UPEP  HISTORY OF PRESENTING ILLNESS:  Morgan Livingston is a  72 y.o.  female with PMH listed below who was referred to me for evaluation of abnormal UPEP.   She has CKD and follows up with Dr.Lateef. History of HTN. Reports doing well, denies bone pain.  Fatigue:  Chronic onset, perisistent, no aggravating or improving factors, no associated symptoms.  Reviewed labs which was done at [facility] office.  03/05/2018 SPEP done at central [place] showed negative for M Spike UPEP: M spike 16.4  INTERVAL HISTORY Morgan Livingston is a 72 y.o. female who has above history reviewed by me today presents for follow up visit for management of abnormal UPEP. Patient has had labs done and present to discuss the results. She has no new complaints.  Feeling well at baseline.  Chronic fatigue not changed.  Review of Systems  Constitutional: Positive for malaise/fatigue. Negative for chills, fever and weight loss.  HENT: Negative for congestion, ear discharge, ear pain, nosebleeds, sinus pain and sore throat.   Eyes: Negative for double vision, photophobia, pain, discharge and redness.  Respiratory: Negative for cough, hemoptysis, sputum production, shortness of breath and wheezing.   Cardiovascular: Negative for chest pain, palpitations, orthopnea, claudication and leg swelling.  Gastrointestinal: Negative for abdominal pain, blood in stool, constipation, diarrhea, heartburn, melena, nausea and vomiting.  Genitourinary: Negative for dysuria, flank pain, frequency and hematuria.  Musculoskeletal: Negative for back pain, myalgias and neck pain.  Skin: Negative for itching and rash.  Neurological: Negative for  dizziness, tingling, tremors, focal weakness, weakness and headaches.  Endo/Heme/Allergies: Negative for environmental allergies. Does not bruise/bleed easily.  Psychiatric/Behavioral: Negative for depression and hallucinations. The patient is not nervous/anxious.     MEDICAL HISTORY:  Past Medical History:  Diagnosis Date  . Cancer [facility])    Uterine Cancer  . Hyperlipidemia   . Hypertension     SURGICAL HISTORY: Past Surgical History:  Procedure Laterality Date  . ABDOMINAL HYSTERECTOMY    . BREAST BIOPSY Right 04/09/2006   negative  . BREAST BIOPSY Left 12/04/2017   stereo path pending for calcs    SOCIAL HISTORY: Social History   Socioeconomic History  . Marital status: Married    Spouse name: Paramedic  . Number of children: 2  . Years of education: some college  . Highest education level: 12th grade  Occupational History  . 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 Financial resource strain: Not hard at all  . Food insecurity:    Worry: Never true    Inability: Never true  . Transportation needs:    Medical: No    Non-medical: No  Tobacco Use  . Smoking status: Former Smoker    Packs/day: 1.00    Years: 2.00    Pack years: 2.00    Types: Cigarettes    Last attempt to quit: 1963    Years since quitting: 56.5  . Smokeless tobacco: Never Used  . Tobacco comment: smoking cessation materials not required  Substance and Sexual Activity  . Alcohol use: No    Alcohol/week: 0.0 oz  . Drug use: No  . Sexual activity: Yes    Partners: Male    Birth control/protection: Post-menopausal  Lifestyle  .  Physical activity:    Days per week: 7 days    Minutes per session: 30 min  . Stress: Not at all  Relationships  . Social connections:    Talks on phone: More than three times a week    Gets together: More than three times a week    Attends religious service: More than 4 times per year    Active member of club or organization: Yes     Attends meetings of clubs or organizations: More than 4 times per year    Relationship status: Married  . Intimate partner violence:    Fear of current or ex partner: No    Emotionally abused: No    Physically abused: No    Forced sexual activity: No  Other Topics Concern  . Not on file  Social History Narrative   Married for 58 years     FAMILY HISTORY: Family History  Problem Relation Age of Onset  . Congestive Heart Failure Mother 20  . Heart attack Father 13  . Sinusitis Sister   . Stomach cancer Sister   . Cancer Sister   . Dementia Sister        71  . Breast cancer Neg Hx     ALLERGIES:  has No Known Allergies.  MEDICATIONS:  Current Outpatient Medications  Medication Sig Dispense Refill  . aspirin 81 MG tablet Take 81 mg by mouth daily.    . Biotin 5 MG CAPS Take by mouth daily.    . cholecalciferol (VITAMIN D) 1000 UNITS tablet Take by mouth.    . fexofenadine (ALLEGRA) 180 MG tablet Take 180 mg by mouth daily.    . fluticasone (FLONASE) 50 MCG/ACT nasal spray Place 2 sprays into both nostrils daily. 16 g 0  . losartan-hydrochlorothiazide (HYZAAR) 100-12.5 MG tablet Take 1 tablet by mouth daily. 90 tablet 1  . rosuvastatin (CRESTOR) 20 MG tablet Take 1 tablet (20 mg total) by mouth daily. 90 tablet 1   No current facility-administered medications for this visit.      PHYSICAL EXAMINATION: ECOG PERFORMANCE STATUS: 0 - Asymptomatic Vitals:   05/09/18 1015  BP: (!) 145/86  Pulse: 71  Resp: 20  Temp: (!) 97.4 F (36.3 C)   Filed Weights   05/09/18 1015  Weight: 170 lb 12.8 oz (77.5 kg)    Physical Exam  Constitutional: She is oriented to person, place, and time. She appears well-developed and well-nourished. No distress.  HENT:  Head: Normocephalic and atraumatic.  Right Ear: External ear normal.  Left Ear: External ear normal.  Mouth/Throat: Oropharynx is clear and moist.  Eyes: Pupils are equal, round, and reactive to light. Conjunctivae and EOM  are normal. No scleral icterus.  Neck: Normal range of motion. Neck supple.  Cardiovascular: Normal rate, regular rhythm and normal heart sounds.  Pulmonary/Chest: Effort normal and breath sounds normal. No respiratory distress. She has no wheezes. She has no rales. She exhibits no tenderness.  Abdominal: Soft. Bowel sounds are normal. She exhibits no distension and no mass. There is no tenderness.  Musculoskeletal: Normal range of motion. She exhibits no edema or deformity.  Lymphadenopathy:    She has no cervical adenopathy.  Neurological: She is alert and oriented to person, place, and time. No cranial nerve deficit. Coordination normal.  Skin: Skin is warm and dry. No rash noted.  Psychiatric: She has a normal mood and affect. Her behavior is normal. Thought content normal.     LABORATORY DATA:  I have  reviewed the data as listed Lab Results  Component Value Date   WBC 6.1 04/16/2018   HGB 12.9 04/16/2018   HCT 37.8 04/16/2018   MCV 93.2 04/16/2018   PLT 145 (L) 04/16/2018   Recent Labs    08/28/17 1242 01/22/18 0952 04/16/18 1038  NA 140 140 141  K 4.2 3.8 4.1  CL 105 103 105  CO2 31 32 29  GLUCOSE 85 95 129*  BUN _0 CREATININE 1.16* 1.28* 1.28*  CALCIUM 9.4 9.7 9.8  GFRNONAA 48* 42* 41*  GFRAA 56* 49* 48*  PROT 7.0 7.5 7.7  ALBUMIN  --   --  3.8  AST _1 ALT _2 ALKPHOS  --   --  63  BILITOT 0.5 0.5 0.6   Iron/TIBC/Ferritin/ %Sat    Component Value Date/Time   IRON 76 05/23/2015 0914   TIBC 276 05/23/2015 0914   FERRITIN 57 05/23/2015 0914   IRONPCTSAT 28 05/23/2015 0914    04/17/2018  SPEP no M spike. Normal serum free light chain ratio. UPEP, no MI spike, elevated free kappa/lamda ratio at 20.75.   ASSESSMENT & PLAN:  1. Proteinuria, unspecified type   2. Thrombocytopenia (HCC)   Monoclonal gammopathy lab work-up reviewed and discussed with patient and her husband.  She has no M protein on SPEP, serum free light chain ratio   within normal range. UPEP showed no M protein.  Urine free light chain ratio elevated at 20.75. Discussed with patient that less likely she has multiple myeloma.  Elevated protein/free light chain most likely secondary to proteinuria/CKD. Previous  UPEP done at the nephrologist office was positive for M protein.  I will repeat another test in 3 months. Mild thrombocytopenia, etiology unknown.  Will repeat another CBC in 3 months.  If persistently low, will trigger work-up for thrombocytopenia.  Patient has many questions and answered to her satisfaction.  Orders Placed This Encounter  Procedures  . 24 hr Ur UPEP/UIFE/Light Chains/TP    Standing Status:   Future    Standing Expiration Date:   05/10/2019  . CBC with Differential/Platelet    Standing Status:   Future    Standing Expiration Date:   05/10/2019    All questions were answered. The patient knows to call the clinic with any problems questions or concerns.  Return of visit: 3 months Total face to face encounter time for this patient visit was 25 min. >50% of the time was  spent in counseling and coordination of care.  Earlie Server, MD, PhD Hematology Oncology [facility] at [facility] Pager- [phone] 05/09/2018

## 2018-05-09 NOTE — Progress Notes (Signed)
Patient here today for follow up regarding lab results.

## 2018-06-04 ENCOUNTER — Other Ambulatory Visit: Payer: Self-pay | Admitting: Family Medicine

## 2018-06-04 ENCOUNTER — Ambulatory Visit
Admission: RE | Admit: 2018-06-04 | Discharge: 2018-06-04 | Disposition: A | Discharge status: Home / Self Care | Payer: Medicare Other | Source: Ambulatory Visit | Attending: Family Medicine | Admitting: Family Medicine

## 2018-06-04 DIAGNOSIS — R928 Other abnormal and inconclusive findings on diagnostic imaging of breast: Secondary | ICD-10-CM | POA: Diagnosis present

## 2018-06-06 ENCOUNTER — Other Ambulatory Visit: Payer: Self-pay | Admitting: Family Medicine

## 2018-06-06 DIAGNOSIS — N6012 Diffuse cystic mastopathy of left breast: Secondary | ICD-10-CM

## 2018-06-06 DIAGNOSIS — R928 Other abnormal and inconclusive findings on diagnostic imaging of breast: Secondary | ICD-10-CM

## 2018-06-06 DIAGNOSIS — N6320 Unspecified lump in the left breast, unspecified quadrant: Secondary | ICD-10-CM

## 2018-06-21 DIAGNOSIS — N200 Calculus of kidney: Secondary | ICD-10-CM

## 2018-06-21 HISTORY — DX: Calculus of kidney: N20.0

## 2018-07-25 ENCOUNTER — Encounter: Payer: Self-pay | Admitting: Family Medicine

## 2018-07-25 ENCOUNTER — Ambulatory Visit
Admission: RE | Admit: 2018-07-25 | Discharge: 2018-07-25 | Disposition: A | Discharge status: Home / Self Care | Payer: Medicare Other | Source: Ambulatory Visit | Attending: Family Medicine | Admitting: Family Medicine

## 2018-07-25 ENCOUNTER — Ambulatory Visit: Payer: Medicare Other | Admitting: Family Medicine

## 2018-07-25 ENCOUNTER — Telehealth: Payer: Self-pay

## 2018-07-25 VITALS — BP 132/82 | HR 74 | Temp 97.8°F | Ht 65.0 in | Wt 173.5 lb

## 2018-07-25 DIAGNOSIS — Z23 Encounter for immunization: Secondary | ICD-10-CM | POA: Diagnosis not present

## 2018-07-25 DIAGNOSIS — Z8742 Personal history of other diseases of the female genital tract: Secondary | ICD-10-CM

## 2018-07-25 DIAGNOSIS — M545 Low back pain, unspecified: Secondary | ICD-10-CM

## 2018-07-25 DIAGNOSIS — I77819 Aortic ectasia, unspecified site: Secondary | ICD-10-CM | POA: Diagnosis not present

## 2018-07-25 DIAGNOSIS — N183 Chronic kidney disease, stage 3 unspecified: Secondary | ICD-10-CM

## 2018-07-25 DIAGNOSIS — R739 Hyperglycemia, unspecified: Secondary | ICD-10-CM

## 2018-07-25 DIAGNOSIS — R1031 Right lower quadrant pain: Secondary | ICD-10-CM

## 2018-07-25 DIAGNOSIS — E785 Hyperlipidemia, unspecified: Secondary | ICD-10-CM | POA: Diagnosis not present

## 2018-07-25 DIAGNOSIS — M4316 Spondylolisthesis, lumbar region: Secondary | ICD-10-CM | POA: Diagnosis not present

## 2018-07-25 DIAGNOSIS — I700 Atherosclerosis of aorta: Secondary | ICD-10-CM | POA: Insufficient documentation

## 2018-07-25 DIAGNOSIS — I10 Essential (primary) hypertension: Secondary | ICD-10-CM

## 2018-07-25 DIAGNOSIS — E663 Overweight: Secondary | ICD-10-CM

## 2018-07-25 DIAGNOSIS — R198 Other specified symptoms and signs involving the digestive system and abdomen: Secondary | ICD-10-CM

## 2018-07-25 DIAGNOSIS — M47816 Spondylosis without myelopathy or radiculopathy, lumbar region: Secondary | ICD-10-CM | POA: Insufficient documentation

## 2018-07-25 DIAGNOSIS — D691 Qualitative platelet defects: Secondary | ICD-10-CM

## 2018-07-25 NOTE — Telephone Encounter (Signed)
Copied from [place] [id number]. Topic: General - Other >> Jul 25, 2018  2:45 PM Nathanial Millman J wrote: Reason for CRM: Per scheduling, U/S PELVIS (TRANSABDOMINAL ONLY),  needs to have a transvaginal ultrasound added to it for the diagnosis listed.

## 2018-07-25 NOTE — Progress Notes (Signed)
Name: Morgan Livingston   MRN: [id number]    DOB: 04/07/1946   Date:07/25/2018       Progress Note  Subjective  Chief Complaint  Chief Complaint  Patient presents with  . Follow-up    6 month  . Hypertension  . Hyperlipidemia    HPI  HTN: taking medication, bp has been well controlled. She denies chest pain or palpitation. She is checking bp at home and is around 130-80's.   Dyslipidemia: reviewed labs, continue statin therapy and aspirin daily, no chest pain or muscle cramps. LDL not at goal, she has aorta atherosclerosis but does not want to add zetia or any other medication at this time.   Hyperglycemia: last hgbA1C was elevated at 6.2%, she still likes sweets, but is trying to replace with fruit, discussed importance of avoiding starches. Denies polyphagia, polydipsia or polyuria. We will recheck yearly   CKI stage III: seen by nephrologist and had normal renal US, found to have proteinuria and referred to [name] who is monitoring for Multiple Myeloma. She feels tired, last labs showed mild thrombocytopenia  Acute on chronic low back pain: she states she has noticed dull aching on lower back can be on left or right side, in August went to urgent care because the pain was so intense, diagnosed clinically with kidney stone given toradol and sent home with flomax and hydrocodone that she could not tolerate - caused nausea. Pain is now on right lower back and radiates to right lower quadrant, re-started yesterday, aching like. No fever or chills, no blood in urine. Previous CT done in 2016 showed small ovarian cyst. We will check pelvic US and also spine x-ray and return in 1 month for follow up  Change in bowel movements. Going on for the past year, loose stools, bristol 4-6, after each meal, not associated with pain or blood in stools, she had a colonoscopy in 20013 that showed diverticulosis. No weight loss.   Patient Active Problem List   Diagnosis Date Noted  . Atherosclerosis of  aorta ([place]) 07/25/2018  . Thrombocytopenia ([place]) 05/09/2018  . Proteinuria 05/09/2018  . Positive ANA (antinuclear antibody) 04/02/2018  . Bilateral dry eyes 04/02/2018  . History of hysterectomy 05/23/2015  . Chronic kidney disease, stage III (moderate) ([place]) 05/23/2015  . Benign hypertension 05/22/2015  . Chronic constipation 05/22/2015  . DDD (degenerative disc disease), lumbar 05/22/2015  . DD (diverticular disease) 05/22/2015  . Dyslipidemia 05/22/2015  . History of cervical cancer 05/22/2015  . Helicobacter pylori gastrointestinal tract infection 05/22/2015  . Blood glucose elevated 05/22/2015  . Overweight (BMI 25.0-29.9) 05/22/2015  . Perennial allergic rhinitis with seasonal variation 05/22/2015  . Vitamin D deficiency 05/22/2015  . Impingement syndrome of shoulder 01/14/2015    Past Surgical History:  Procedure Laterality Date  . ABDOMINAL HYSTERECTOMY    . BREAST BIOPSY Right 04/09/2006   negative  . BREAST BIOPSY Left 12/04/2017   fibroadenmatoid change with coarse calcs    Family History  Problem Relation Age of Onset  . Congestive Heart Failure Mother 68  . Heart attack Father 49  . Sinusitis Sister   . Stomach cancer Sister   . Cancer Sister   . Dementia Sister        55  . Breast cancer Neg Hx     Social History   Socioeconomic History  . Marital status: Married    Spouse name: Paramedic  . Number of children: 2  . Years of education: some college  .  Highest education level: 12th grade  Occupational History  . 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 Financial resource strain: Not hard at all  . Food insecurity:    Worry: Never true    Inability: Never true  . Transportation needs:    Medical: No    Non-medical: No  Tobacco Use  . Smoking status: Former Smoker    Packs/day: 1.00    Years: 2.00    Pack years: 2.00    Types: Cigarettes    Last attempt to quit: 1963    Years since quitting: 56.7  . Smokeless  tobacco: Never Used  . Tobacco comment: smoking cessation materials not required  Substance and Sexual Activity  . Alcohol use: No    Alcohol/week: 0.0 standard drinks  . Drug use: No  . Sexual activity: Yes    Partners: Male    Birth control/protection: Post-menopausal  Lifestyle  . Physical activity:    Days per week: 7 days    Minutes per session: 30 min  . Stress: Not at all  Relationships  . Social connections:    Talks on phone: More than three times a week    Gets together: More than three times a week    Attends religious service: More than 4 times per year    Active member of club or organization: Yes    Attends meetings of clubs or organizations: More than 4 times per year    Relationship status: Married  . Intimate partner violence:    Fear of current or ex partner: No    Emotionally abused: No    Physically abused: No    Forced sexual activity: No  Other Topics Concern  . Not on file  Social History Narrative   Married for 50 years      Current Outpatient Medications:  .  aspirin 81 MG tablet, Take 81 mg by mouth daily., Disp: , Rfl:  .  Biotin 5 MG CAPS, Take by mouth daily., Disp: , Rfl:  .  cholecalciferol (VITAMIN D) 1000 UNITS tablet, Take by mouth., Disp: , Rfl:  .  fexofenadine (ALLEGRA) 180 MG tablet, Take 180 mg by mouth daily., Disp: , Rfl:  .  fluticasone (FLONASE) 50 MCG/ACT nasal spray, Place 2 sprays into both nostrils daily., Disp: 16 g, Rfl: 0 .  losartan-hydrochlorothiazide (HYZAAR) 100-12.5 MG tablet, Take 1 tablet by mouth daily., Disp: 90 tablet, Rfl: 1 .  rosuvastatin (CRESTOR) 20 MG tablet, Take 1 tablet (20 mg total) by mouth daily., Disp: 90 tablet, Rfl: 1  No Known Allergies  I personally reviewed active problem list, medication list, allergies, family history, social history with the patient/caregiver today.   ROS  Constitutional: Negative for fever or weight change.  Respiratory: Negative for cough and shortness of breath.    Cardiovascular: Negative for chest pain or palpitations.  Gastrointestinal: positive  for intermittent right lower abdominal pain, positive for change in  bowel changes.  Musculoskeletal: Negative for gait problem or joint swelling.  Skin: Negative for rash.  Neurological: Negative for dizziness or headache.  No other specific complaints in a complete review of systems (except as listed in HPI above).  Objective  Vitals:   07/25/18 0824  BP: 132/82  Pulse: 74  Temp: 97.8 F (36.6 C)  SpO2: 99%  Weight: 173 lb 8 oz (78.7 kg)  Height: _0  (1.651 m)    Body mass index is 28.87 kg/m.  Physical Exam  Constitutional: Patient appears well-developed and well-nourished. Overweight.  No distress.  HEENT: head atraumatic, normocephalic, pupils equal and reactive to light, neck supple, throat within normal limits Cardiovascular: Normal rate, regular rhythm and normal heart sounds.  No murmur heard. No BLE edema. Pulmonary/Chest: Effort normal and breath sounds normal. No respiratory distress. Abdominal: Soft.  There is  Tenderness right lower quadrant with some guarding, no masses . Muscular skeletal: no pain during palpation of lumbar spine, negative straight leg raise  Psychiatric: Patient has a normal mood and affect. behavior is normal. Judgment and thought content normal.  PHQ2/9: Depression screen [facility] 2/9 07/25/2018 01/22/2018 01/22/2018 12/20/2017 08/14/2017  Decreased Interest 0 0 0 0 0  Down, Depressed, Hopeless 0 0 0 0 0  PHQ - 2 Score 0 0 0 0 0     Fall Risk: Fall Risk  07/25/2018 01/22/2018 12/20/2017 08/14/2017 01/04/2017  Falls in the past year? _0     Functional Status Survey: Is the patient deaf or have difficulty hearing?: No Does the patient have difficulty seeing, even when wearing glasses/contacts?: No Does the patient have difficulty concentrating, remembering, or making decisions?: No Does the patient have difficulty walking or climbing stairs?:  No Does the patient have difficulty dressing or bathing?: No Does the patient have difficulty doing errands alone such as visiting a doctor's office or shopping?: No   Assessment & Plan  1. Bilateral low back pain without sciatica, unspecified chronicity  - DG Lumbar Spine Complete; Future  2. Need for influenza vaccination  - Flu vaccine HIGH DOSE PF  3. Dyslipidemia  On statin therapy, last LDL was not at goal , discussed adding zetia, but she wants to hold off for now  4. Stage 3 chronic kidney disease (HCC)  Seeing [name], found to have proteinuria and is getting evaluated by [name] to monitor for multiple myeloma  5. Essential hypertension  At goal, continue medication   6. Atherosclerosis of aorta (HCC)  On statin and aspirn  7. Thrombocytopathia ([place])  On recent labs done by [name]   8. Blood glucose elevated  Last hgbA1C 6.2% discussed repeating yearly   9. Overweight (BMI 25.0-29.9)   10. Right lower quadrant pain  - US Pelvis Complete; Future  11. History of ovarian cyst  - US Pelvis Complete; Future  12. Change in bowel movement  - Ambulatory referral to Gastroenterology

## 2018-07-30 ENCOUNTER — Ambulatory Visit
Admission: RE | Admit: 2018-07-30 | Discharge: 2018-07-30 | Disposition: A | Discharge status: Home / Self Care | Payer: Medicare Other | Source: Ambulatory Visit | Attending: Family Medicine | Admitting: Family Medicine

## 2018-07-30 DIAGNOSIS — R1031 Right lower quadrant pain: Secondary | ICD-10-CM | POA: Insufficient documentation

## 2018-07-30 DIAGNOSIS — Z90710 Acquired absence of both cervix and uterus: Secondary | ICD-10-CM | POA: Insufficient documentation

## 2018-07-30 DIAGNOSIS — Z8742 Personal history of other diseases of the female genital tract: Secondary | ICD-10-CM | POA: Insufficient documentation

## 2018-07-30 DIAGNOSIS — N83291 Other ovarian cyst, right side: Secondary | ICD-10-CM | POA: Diagnosis not present

## 2018-08-08 ENCOUNTER — Inpatient Hospital Stay: Payer: Medicare Other | Attending: Oncology

## 2018-08-08 DIAGNOSIS — D696 Thrombocytopenia, unspecified: Secondary | ICD-10-CM | POA: Diagnosis not present

## 2018-08-08 DIAGNOSIS — Z8542 Personal history of malignant neoplasm of other parts of uterus: Secondary | ICD-10-CM | POA: Diagnosis not present

## 2018-08-08 DIAGNOSIS — Z79899 Other long term (current) drug therapy: Secondary | ICD-10-CM | POA: Insufficient documentation

## 2018-08-08 DIAGNOSIS — E785 Hyperlipidemia, unspecified: Secondary | ICD-10-CM | POA: Insufficient documentation

## 2018-08-08 DIAGNOSIS — R5383 Other fatigue: Secondary | ICD-10-CM | POA: Diagnosis not present

## 2018-08-08 DIAGNOSIS — N189 Chronic kidney disease, unspecified: Secondary | ICD-10-CM | POA: Insufficient documentation

## 2018-08-08 DIAGNOSIS — R809 Proteinuria, unspecified: Secondary | ICD-10-CM | POA: Diagnosis not present

## 2018-08-08 DIAGNOSIS — Z87891 Personal history of nicotine dependence: Secondary | ICD-10-CM | POA: Insufficient documentation

## 2018-08-08 DIAGNOSIS — E538 Deficiency of other specified B group vitamins: Secondary | ICD-10-CM | POA: Insufficient documentation

## 2018-08-08 DIAGNOSIS — I129 Hypertensive chronic kidney disease with stage 1 through stage 4 chronic kidney disease, or unspecified chronic kidney disease: Secondary | ICD-10-CM | POA: Diagnosis not present

## 2018-08-08 DIAGNOSIS — Z7982 Long term (current) use of aspirin: Secondary | ICD-10-CM | POA: Diagnosis not present

## 2018-08-08 LAB — CBC WITH DIFFERENTIAL/PLATELET
BASOS PCT: 0 %
Basophils Absolute: 0 10*3/uL (ref 0–0.1)
Eosinophils Absolute: 0 10*3/uL (ref 0–0.7)
Eosinophils Relative: 1 %
HEMATOCRIT: 35.9 % (ref 35.0–47.0)
HEMOGLOBIN: 12.3 g/dL (ref 12.0–16.0)
Lymphocytes Relative: 40 %
Lymphs Abs: 2.4 10*3/uL (ref 1.0–3.6)
MCH: 31.5 pg (ref 26.0–34.0)
MCHC: 34.2 g/dL (ref 32.0–36.0)
MCV: 92.1 fL (ref 80.0–100.0)
Monocytes Absolute: 0.5 10*3/uL (ref 0.2–0.9)
Monocytes Relative: 9 %
NEUTROS ABS: 3.1 10*3/uL (ref 1.4–6.5)
Neutrophils Relative %: 50 %
Platelets: 142 10*3/uL — ABNORMAL LOW (ref 150–440)
RBC: 3.9 MIL/uL (ref 3.80–5.20)
RDW: 14 % (ref 11.5–14.5)
WBC: 6.1 10*3/uL (ref 3.6–11.0)

## 2018-08-12 DIAGNOSIS — D696 Thrombocytopenia, unspecified: Secondary | ICD-10-CM | POA: Diagnosis not present

## 2018-08-13 ENCOUNTER — Other Ambulatory Visit: Payer: Self-pay

## 2018-08-13 DIAGNOSIS — R809 Proteinuria, unspecified: Secondary | ICD-10-CM

## 2018-08-14 LAB — UPEP/UIFE/LIGHT CHAINS/TP, 24-HR UR
% BETA, Urine: 37.7 %
ALPHA 1 URINE: 7.8 %
Albumin, U: 14.3 %
Alpha 2, Urine: 20.6 %
FREE KAPPA LT CHAINS, UR: 395 mg/L — AB (ref 1.35–24.19)
FREE KAPPA/LAMBDA RATIO: 16.53 — AB (ref 2.04–10.37)
FREE LAMBDA LT CHAINS, UR: 23.9 mg/L — AB (ref 0.24–6.66)
GAMMA GLOBULIN URINE: 19.5 %
TOTAL PROTEIN, URINE-UR/DAY: 430 mg/(24.h) — AB (ref 30–150)
TOTAL VOLUME: 2150
Total Protein, Urine: 20 mg/dL

## 2018-08-15 ENCOUNTER — Inpatient Hospital Stay: Payer: Medicare Other

## 2018-08-15 ENCOUNTER — Inpatient Hospital Stay: Payer: Medicare Other | Admitting: Oncology

## 2018-08-15 ENCOUNTER — Other Ambulatory Visit: Payer: Self-pay

## 2018-08-15 ENCOUNTER — Encounter: Payer: Self-pay | Admitting: Oncology

## 2018-08-15 VITALS — BP 148/92 | HR 60 | Temp 96.4°F | Resp 18 | Wt 173.7 lb

## 2018-08-15 DIAGNOSIS — D696 Thrombocytopenia, unspecified: Secondary | ICD-10-CM

## 2018-08-15 DIAGNOSIS — N189 Chronic kidney disease, unspecified: Secondary | ICD-10-CM | POA: Diagnosis not present

## 2018-08-15 DIAGNOSIS — R809 Proteinuria, unspecified: Secondary | ICD-10-CM

## 2018-08-15 DIAGNOSIS — I129 Hypertensive chronic kidney disease with stage 1 through stage 4 chronic kidney disease, or unspecified chronic kidney disease: Secondary | ICD-10-CM

## 2018-08-15 DIAGNOSIS — Z87891 Personal history of nicotine dependence: Secondary | ICD-10-CM

## 2018-08-15 LAB — CBC WITH DIFFERENTIAL/PLATELET
BASOS ABS: 0 10*3/uL (ref 0.0–0.1)
Basophils Relative: 0 %
EOS ABS: 0.1 10*3/uL (ref 0.0–0.5)
EOS PCT: 1 %
HCT: 36.4 % (ref 36.0–46.0)
Hemoglobin: 12 g/dL (ref 12.0–15.0)
LYMPHS ABS: 2.3 10*3/uL (ref 0.7–4.0)
LYMPHS PCT: 37 %
MCH: 30.7 pg (ref 26.0–34.0)
MCHC: 33 g/dL (ref 30.0–36.0)
MCV: 93.1 fL (ref 80.0–100.0)
Monocytes Absolute: 0.6 10*3/uL (ref 0.1–1.0)
Monocytes Relative: 9 %
NEUTROS PCT: 53 %
NRBC: 0 % (ref 0.0–0.2)
Neutro Abs: 3.2 10*3/uL (ref 1.7–7.7)
PLATELETS: 140 10*3/uL — AB (ref 150–400)
RBC: 3.91 MIL/uL (ref 3.87–5.11)
RDW: 14 % (ref 11.5–15.5)
WBC: 6.1 10*3/uL (ref 4.0–10.5)

## 2018-08-15 LAB — FOLATE: Folate: 7.9 ng/mL (ref >5.9)

## 2018-08-15 LAB — TECHNOLOGIST SMEAR REVIEW

## 2018-08-15 LAB — VITAMIN B12: VITAMIN B 12: 129 pg/mL — AB (ref 180–914)

## 2018-08-15 NOTE — Progress Notes (Signed)
Hematology/Oncology Consult note [facility] Telephone:([phone] [phone]   Patient Care Team: Steele Sizer, MD as PCP - General (Family Medicine)  REFERRING PROVIDER: Dr.Lateef REASON FOR VISIT Follow up for management of abnormal UPEP  HISTORY OF PRESENTING ILLNESS:  Morgan Livingston is a  72 y.o.  female with PMH listed below who was referred to me for evaluation of abnormal UPEP.   She has CKD and follows up with Dr.Lateef. History of HTN. Reports doing well, denies bone pain.  Fatigue:  Chronic onset, perisistent, no aggravating or improving factors, no associated symptoms.  Reviewed labs which was done at [facility] office.  03/05/2018 SPEP done at [place] showed negative for M Spike UPEP: M spike 16.4  INTERVAL HISTORY Morgan Livingston is a 72 y.o. female who has above history reviewed by me today presents for follow up visit for management of thrombocytopenia and abnormal UPEP. She has no new complaints. Doing well. Chronic fatigue is not changed.   Review of Systems  Constitutional: Positive for malaise/fatigue. Negative for chills, fever and weight loss.  HENT: Negative for congestion, ear discharge, ear pain, nosebleeds, sinus pain and sore throat.   Eyes: Negative for double vision, photophobia, pain, discharge and redness.  Respiratory: Negative for cough, hemoptysis, sputum production, shortness of breath and wheezing.   Cardiovascular: Negative for chest pain, palpitations, orthopnea, claudication and leg swelling.  Gastrointestinal: Negative for abdominal pain, blood in stool, constipation, diarrhea, heartburn, melena, nausea and vomiting.  Genitourinary: Negative for dysuria, flank pain, frequency and hematuria.  Musculoskeletal: Negative for back pain, myalgias and neck pain.  Skin: Negative for itching and rash.  Neurological: Negative for dizziness, tingling, tremors, focal weakness, weakness and  headaches.  Endo/Heme/Allergies: Negative for environmental allergies. Does not bruise/bleed easily.  Psychiatric/Behavioral: Negative for depression and hallucinations. The patient is not nervous/anxious.     MEDICAL HISTORY:  Past Medical History:  Diagnosis Date  . Cancer [facility])    Uterine Cancer  . Hyperlipidemia   . Hypertension   . Kidney stones 06/21/2018    SURGICAL HISTORY: Past Surgical History:  Procedure Laterality Date  . ABDOMINAL HYSTERECTOMY    . BREAST BIOPSY Right 04/09/2006   negative  . BREAST BIOPSY Left 12/04/2017   fibroadenmatoid change with coarse calcs    SOCIAL HISTORY: Social History   Socioeconomic History  . Marital status: Married    Spouse name: Paramedic  . Number of children: 2  . Years of education: some college  . Highest education level: 12th grade  Occupational History  . 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 Financial resource strain: Not hard at all  . Food insecurity:    Worry: Never true    Inability: Never true  . Transportation needs:    Medical: No    Non-medical: No  Tobacco Use  . Smoking status: Former Smoker    Packs/day: 1.00    Years: 2.00    Pack years: 2.00    Types: Cigarettes    Last attempt to quit: 1963    Years since quitting: 56.8  . Smokeless tobacco: Never Used  . Tobacco comment: smoking cessation materials not required  Substance and Sexual Activity  . Alcohol use: No    Alcohol/week: 0.0 standard drinks  . Drug use: No  . Sexual activity: Yes    Partners: Male    Birth control/protection: Post-menopausal  Lifestyle  . Physical activity:  Days per week: 7 days    Minutes per session: 30 min  . Stress: Not at all  Relationships  . Social connections:    Talks on phone: More than three times a week    Gets together: More than three times a week    Attends religious service: More than 4 times per year    Active member of club or organization: Yes    Attends  meetings of clubs or organizations: More than 4 times per year    Relationship status: Married  . Intimate partner violence:    Fear of current or ex partner: No    Emotionally abused: No    Physically abused: No    Forced sexual activity: No  Other Topics Concern  . Not on file  Social History Narrative   Married for 32 years     FAMILY HISTORY: Family History  Problem Relation Age of Onset  . Congestive Heart Failure Mother 12  . Heart attack Father 76  . Sinusitis Sister   . Stomach cancer Sister   . Cancer Sister   . Dementia Sister        8  . Breast cancer Neg Hx     ALLERGIES:  has No Known Allergies.  MEDICATIONS:  Current Outpatient Medications  Medication Sig Dispense Refill  . aspirin 81 MG tablet Take 81 mg by mouth daily.    . Biotin 5 MG CAPS Take by mouth daily.    . cholecalciferol (VITAMIN D) 1000 UNITS tablet Take by mouth.    . fexofenadine (ALLEGRA) 180 MG tablet Take 180 mg by mouth daily.    . fluticasone (FLONASE) 50 MCG/ACT nasal spray Place 2 sprays into both nostrils daily. 16 g 0  . losartan-hydrochlorothiazide (HYZAAR) 100-12.5 MG tablet Take 1 tablet by mouth daily. 90 tablet 1  . rosuvastatin (CRESTOR) 20 MG tablet Take 1 tablet (20 mg total) by mouth daily. 90 tablet 1   No current facility-administered medications for this visit.      PHYSICAL EXAMINATION: ECOG PERFORMANCE STATUS: 0 - Asymptomatic Vitals:   08/15/18 1033 08/15/18 1035  BP: (!) 164/94 (!) 148/92  Pulse: 71 60  Resp: 18   Temp: (!) 96.4 F (35.8 C)    Filed Weights   08/15/18 1033  Weight: 173 lb 11.2 oz (78.8 kg)    Physical Exam  Constitutional: She is oriented to person, place, and time. No distress.  HENT:  Head: Normocephalic and atraumatic.  Mouth/Throat: Oropharynx is clear and moist.  Eyes: Pupils are equal, round, and reactive to light. Conjunctivae and EOM are normal. No scleral icterus.  Neck: Normal range of motion. Neck supple.    Cardiovascular: Normal rate, regular rhythm and normal heart sounds.  Pulmonary/Chest: Effort normal and breath sounds normal. No respiratory distress. She has no wheezes. She has no rales. She exhibits no tenderness.  Abdominal: Soft. Bowel sounds are normal. She exhibits no distension and no mass. There is no tenderness.  Musculoskeletal: Normal range of motion. She exhibits no edema or deformity.  Lymphadenopathy:    She has no cervical adenopathy.  Neurological: She is alert and oriented to person, place, and time. No cranial nerve deficit. Coordination normal.  Skin: Skin is warm and dry. No rash noted. No erythema.  Psychiatric: She has a normal mood and affect. Her behavior is normal. Thought content normal.     LABORATORY DATA:  I have reviewed the data as listed Lab Results  Component Value Date  WBC 6.1 08/15/2018   HGB 12.0 08/15/2018   HCT 36.4 08/15/2018   MCV 93.1 08/15/2018   PLT 140 (L) 08/15/2018   Recent Labs    08/28/17 1242 01/22/18 0952 04/16/18 1038  NA 140 140 141  K 4.2 3.8 4.1  CL 105 103 105  CO2 31 32 29  GLUCOSE 85 95 129*  BUN 14 15 17   CREATININE 1.16* 1.28* 1.28*  CALCIUM 9.4 9.7 9.8  GFRNONAA 48* 42* 41*  GFRAA 56* 49* 48*  PROT 7.0 7.5 7.7  ALBUMIN  --   --  3.8  AST 22 24 27   ALT 14 15 19   ALKPHOS  --   --  63  BILITOT 0.5 0.5 0.6   Iron/TIBC/Ferritin/ %Sat    Component Value Date/Time   IRON 76 05/23/2015 0914   TIBC 276 05/23/2015 0914   FERRITIN 57 05/23/2015 0914   IRONPCTSAT 28 05/23/2015 0914    04/17/2018  SPEP no M spike. Normal serum free light chain ratio. UPEP, no M spike, elevated free kappa/lamda ratio at 20.75.   08/12/2018 UPEP no M spike  ASSESSMENT & PLAN:  1. Thrombocytopenia ([place])   2. Proteinuria, unspecified type   labs reviewed and discussed with patient.  Repeat 24 hour UPEP showed no M protein spike, immunofixation showed normal pattern. She does have increased protein/light chain ratio in urine  which I think is mostly due to CKD.   # Mild thrombocytopenia, will check smear, folate, vitamin B12. Hepatitis, HIV.  Continue to monitor.   Follow up in 4 months.   Orders Placed This Encounter  Procedures  . Folate    Standing Status:   Future    Number of Occurrences:   1    Standing Expiration Date:   08/16/2019  . Vitamin B12    Standing Status:   Future    Number of Occurrences:   1    Standing Expiration Date:   08/16/2019  . Hepatitis panel, acute    Standing Status:   Future    Number of Occurrences:   1    Standing Expiration Date:   08/16/2019  . Technologist smear review    Standing Status:   Future    Number of Occurrences:   1    Standing Expiration Date:   08/16/2019  . HIV antibody    Standing Status:   Future    Number of Occurrences:   1    Standing Expiration Date:   08/16/2019  . CBC with Differential/Platelet    Standing Status:   Future    Standing Expiration Date:   08/15/2019  . Comprehensive metabolic panel    Standing Status:   Future    Standing Expiration Date:   08/15/2019    All questions were answered. The patient knows to call the clinic with any problems questions or concerns.  Return of visit: 4 months.   Earlie Server, MD, PhD Hematology Oncology [facility] Pager- [phone] 08/15/2018

## 2018-08-15 NOTE — Progress Notes (Signed)
Patient here for follow up. Patient had one episode of severe pain due to passing a stone. Has been seeing [name].

## 2018-08-16 LAB — HEPATITIS PANEL, ACUTE
Hep A IgM: NEGATIVE
Hep B C IgM: NEGATIVE
Hepatitis B Surface Ag: NEGATIVE

## 2018-08-16 LAB — HIV ANTIBODY (ROUTINE TESTING W REFLEX): HIV SCREEN 4TH GENERATION: NONREACTIVE

## 2018-08-17 ENCOUNTER — Other Ambulatory Visit: Payer: Self-pay | Admitting: Family Medicine

## 2018-08-17 DIAGNOSIS — E785 Hyperlipidemia, unspecified: Secondary | ICD-10-CM

## 2018-08-18 ENCOUNTER — Other Ambulatory Visit: Payer: Self-pay | Admitting: Family Medicine

## 2018-08-18 DIAGNOSIS — I10 Essential (primary) hypertension: Secondary | ICD-10-CM

## 2018-08-19 ENCOUNTER — Other Ambulatory Visit: Payer: Self-pay | Admitting: Oncology

## 2018-08-19 DIAGNOSIS — E538 Deficiency of other specified B group vitamins: Secondary | ICD-10-CM | POA: Insufficient documentation

## 2018-08-25 ENCOUNTER — Inpatient Hospital Stay: Payer: Medicare Other

## 2018-08-25 DIAGNOSIS — E538 Deficiency of other specified B group vitamins: Secondary | ICD-10-CM

## 2018-08-25 DIAGNOSIS — D696 Thrombocytopenia, unspecified: Secondary | ICD-10-CM | POA: Diagnosis not present

## 2018-08-25 MED ORDER — CYANOCOBALAMIN 1000 MCG/ML IJ SOLN
1000.0000 ug | Freq: Once | INTRAMUSCULAR | Status: AC
  Administered 2018-08-25: 1000 ug via INTRAMUSCULAR

## 2018-08-26 ENCOUNTER — Inpatient Hospital Stay: Payer: Medicare Other

## 2018-08-26 DIAGNOSIS — E538 Deficiency of other specified B group vitamins: Secondary | ICD-10-CM

## 2018-08-26 DIAGNOSIS — D696 Thrombocytopenia, unspecified: Secondary | ICD-10-CM | POA: Diagnosis not present

## 2018-08-26 MED ORDER — CYANOCOBALAMIN 1000 MCG/ML IJ SOLN
1000.0000 ug | Freq: Once | INTRAMUSCULAR | Status: AC
  Administered 2018-08-26: 1000 ug via INTRAMUSCULAR
  Filled 2018-08-26: qty 1

## 2018-08-27 ENCOUNTER — Inpatient Hospital Stay: Payer: Medicare Other

## 2018-08-27 DIAGNOSIS — D696 Thrombocytopenia, unspecified: Secondary | ICD-10-CM | POA: Diagnosis not present

## 2018-08-27 DIAGNOSIS — E538 Deficiency of other specified B group vitamins: Secondary | ICD-10-CM

## 2018-08-27 MED ORDER — CYANOCOBALAMIN 1000 MCG/ML IJ SOLN
1000.0000 ug | Freq: Once | INTRAMUSCULAR | Status: AC
  Administered 2018-08-27: 1000 ug via INTRAMUSCULAR

## 2018-08-28 ENCOUNTER — Inpatient Hospital Stay: Payer: Medicare Other

## 2018-08-28 DIAGNOSIS — E538 Deficiency of other specified B group vitamins: Secondary | ICD-10-CM

## 2018-08-28 DIAGNOSIS — D696 Thrombocytopenia, unspecified: Secondary | ICD-10-CM | POA: Diagnosis not present

## 2018-08-28 MED ORDER — CYANOCOBALAMIN 1000 MCG/ML IJ SOLN
1000.0000 ug | Freq: Once | INTRAMUSCULAR | Status: AC
  Administered 2018-08-28: 1000 ug via INTRAMUSCULAR

## 2018-08-29 ENCOUNTER — Inpatient Hospital Stay: Payer: Medicare Other

## 2018-08-29 DIAGNOSIS — E538 Deficiency of other specified B group vitamins: Secondary | ICD-10-CM

## 2018-08-29 DIAGNOSIS — D696 Thrombocytopenia, unspecified: Secondary | ICD-10-CM | POA: Diagnosis not present

## 2018-08-29 MED ORDER — CYANOCOBALAMIN 1000 MCG/ML IJ SOLN
1000.0000 ug | Freq: Once | INTRAMUSCULAR | Status: AC
  Administered 2018-08-29: 1000 ug via INTRAMUSCULAR

## 2018-09-01 ENCOUNTER — Ambulatory Visit: Payer: Medicare Other | Admitting: Family Medicine

## 2018-09-01 ENCOUNTER — Encounter: Payer: Self-pay | Admitting: Family Medicine

## 2018-09-01 VITALS — BP 138/76 | HR 75 | Temp 97.8°F | Resp 14 | Ht 65.0 in | Wt 167.8 lb

## 2018-09-01 DIAGNOSIS — N83201 Unspecified ovarian cyst, right side: Secondary | ICD-10-CM | POA: Diagnosis not present

## 2018-09-01 DIAGNOSIS — R809 Proteinuria, unspecified: Secondary | ICD-10-CM | POA: Diagnosis not present

## 2018-09-01 DIAGNOSIS — N183 Chronic kidney disease, stage 3 unspecified: Secondary | ICD-10-CM

## 2018-09-01 DIAGNOSIS — E538 Deficiency of other specified B group vitamins: Secondary | ICD-10-CM | POA: Diagnosis not present

## 2018-09-01 DIAGNOSIS — N2581 Secondary hyperparathyroidism of renal origin: Secondary | ICD-10-CM | POA: Insufficient documentation

## 2018-09-01 DIAGNOSIS — D472 Monoclonal gammopathy: Secondary | ICD-10-CM

## 2018-09-01 DIAGNOSIS — I700 Atherosclerosis of aorta: Secondary | ICD-10-CM

## 2018-09-01 DIAGNOSIS — M51369 Other intervertebral disc degeneration, lumbar region without mention of lumbar back pain or lower extremity pain: Secondary | ICD-10-CM

## 2018-09-01 DIAGNOSIS — M5136 Other intervertebral disc degeneration, lumbar region: Secondary | ICD-10-CM | POA: Diagnosis not present

## 2018-09-01 DIAGNOSIS — R1031 Right lower quadrant pain: Secondary | ICD-10-CM

## 2018-09-01 NOTE — Progress Notes (Signed)
Name: Morgan Livingston   MRN: [id number]    DOB: 01-Nov-1946   Date:09/01/2018       Progress Note  Subjective  Chief Complaint  Chief Complaint  Patient presents with  . Follow-up    1 month     HPI  B12 deficiency / MUGS :seeing hematologist and had B12 injections daily for 5 days last week, she states feeling better since , and will have it every other week for a few months per hematologist recommendation. She was also found to have thrombocytopenia and is being monitored  Atherosclerosis aorta: found on X-ray of lumbar spine, she does not have claudication. She will continue statin and aspirin daily   Proteinuria and CKI stage III: seeing [name], good urine output, drinking lots of fluids, she has secondary hyperparathyroidism. She denies cramping or heartburn.   Low back pain : resolved, x-ray showed some DDD and also an ovarian cyst that has been stable since 2015.   Change in bowel movements: improved the time she takes medications but has follow up with GI coming up next month   Patient Active Problem List   Diagnosis Date Noted  . MGUS (monoclonal gammopathy of unknown significance) 09/01/2018  . Secondary hyperparathyroidism ([place]) 09/01/2018  . B12 deficiency 08/19/2018  . Atherosclerosis of aorta ([place]) 07/25/2018  . Thrombocytopenia ([place]) 05/09/2018  . Proteinuria 05/09/2018  . Positive ANA (antinuclear antibody) 04/02/2018  . Bilateral dry eyes 04/02/2018  . History of hysterectomy 05/23/2015  . Chronic kidney disease, stage III (moderate) ([place]) 05/23/2015  . Benign hypertension 05/22/2015  . Chronic constipation 05/22/2015  . DDD (degenerative disc disease), lumbar 05/22/2015  . DD (diverticular disease) 05/22/2015  . Dyslipidemia 05/22/2015  . History of cervical cancer 05/22/2015  . Helicobacter pylori gastrointestinal tract infection 05/22/2015  . Blood glucose elevated 05/22/2015  . Overweight (BMI 25.0-29.9) 05/22/2015  . Perennial allergic rhinitis  with seasonal variation 05/22/2015  . Vitamin D deficiency 05/22/2015  . Impingement syndrome of shoulder 01/14/2015    Past Surgical History:  Procedure Laterality Date  . ABDOMINAL HYSTERECTOMY    . BREAST BIOPSY Right 04/09/2006   negative  . BREAST BIOPSY Left 12/04/2017   fibroadenmatoid change with coarse calcs    Family History  Problem Relation Age of Onset  . Congestive Heart Failure Mother 49  . Heart attack Father 108  . Sinusitis Sister   . Stomach cancer Sister   . Cancer Sister   . Dementia Sister        38  . Breast cancer Neg Hx     Social History   Socioeconomic History  . Marital status: Married    Spouse name: Paramedic  . Number of children: 2  . Years of education: some college  . Highest education level: 12th grade  Occupational History  . 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 Financial resource strain: Not hard at all  . Food insecurity:    Worry: Never true    Inability: Never true  . Transportation needs:    Medical: No    Non-medical: No  Tobacco Use  . Smoking status: Former Smoker    Packs/day: 1.00    Years: 2.00    Pack years: 2.00    Types: Cigarettes    Last attempt to quit: 1963    Years since quitting: 56.8  . Smokeless tobacco: Never Used  . Tobacco comment: smoking cessation materials not required  Substance and Sexual  Activity  . Alcohol use: No    Alcohol/week: 0.0 standard drinks  . Drug use: No  . Sexual activity: Yes    Partners: Male    Birth control/protection: Post-menopausal  Lifestyle  . Physical activity:    Days per week: 7 days    Minutes per session: 30 min  . Stress: Not at all  Relationships  . Social connections:    Talks on phone: More than three times a week    Gets together: More than three times a week    Attends religious service: More than 4 times per year    Active member of club or organization: Yes    Attends meetings of clubs or organizations: More than 4  times per year    Relationship status: Married  . Intimate partner violence:    Fear of current or ex partner: No    Emotionally abused: No    Physically abused: No    Forced sexual activity: No  Other Topics Concern  . Not on file  Social History Narrative   Married for 50 years      Current Outpatient Medications:  .  aspirin 81 MG tablet, Take 81 mg by mouth daily., Disp: , Rfl:  .  Biotin 5 MG CAPS, Take by mouth daily., Disp: , Rfl:  .  cholecalciferol (VITAMIN D) 1000 UNITS tablet, Take by mouth., Disp: , Rfl:  .  fexofenadine (ALLEGRA) 180 MG tablet, Take 180 mg by mouth daily., Disp: , Rfl:  .  fluticasone (FLONASE) 50 MCG/ACT nasal spray, Place 2 sprays into both nostrils daily., Disp: 16 g, Rfl: 0 .  losartan-hydrochlorothiazide (HYZAAR) 100-12.5 MG tablet, TAKE 1 TABLET BY MOUTH EVERY DAY, Disp: 90 tablet, Rfl: 0 .  rosuvastatin (CRESTOR) 20 MG tablet, TAKE 1 TABLET BY MOUTH EVERY DAY, Disp: 90 tablet, Rfl: 1  No Known Allergies  I personally reviewed active problem list, medication list, allergies, family history, social history with the patient/caregiver today.   ROS  Constitutional: Negative for fever or weight change.  Respiratory: Negative for cough and shortness of breath.   Cardiovascular: Negative for chest pain or palpitations.  Gastrointestinal: Negative for abdominal pain, no bowel changes.  Musculoskeletal: Negative for gait problem or joint swelling.  Skin: Negative for rash.  Neurological: Negative for dizziness or headache.  No other specific complaints in a complete review of systems (except as listed in HPI above).  Objective  Vitals:   09/01/18 1141  BP: 138/76  Pulse: 75  Resp: 14  Temp: 97.8 F (36.6 C)  TempSrc: Oral  SpO2: 97%  Weight: 167 lb 12.8 oz (76.1 kg)  Height: 5\' 5"  (1.651 m)    Body mass index is 27.92 kg/m.  Physical Exam  Constitutional: Patient appears well-developed and well-nourished. Obese  No distress.   HEENT: head atraumatic, normocephalic, pupils equal and reactive to light,, neck supple, throat within normal limits Cardiovascular: Normal rate, regular rhythm and normal heart sounds.  No murmur heard. No BLE edema. Pulmonary/Chest: Effort normal and breath sounds normal. No respiratory distress. Abdominal: Soft.  There is no tenderness. Psychiatric: Patient has a normal mood and affect. behavior is normal. Judgment and thought content normal.  Recent Results (from the past 2160 hour(s))  CBC with Differential/Platelet     Status: Abnormal   Collection Time: 08/08/18 11:39 AM  Result Value Ref Range   WBC 6.1 3.6 - 11.0 K/uL   RBC 3.90 3.80 - 5.20 MIL/uL   Hemoglobin 12.3 12.0 -  16.0 g/dL   HCT 35.9 35.0 - 47.0 %   MCV 92.1 80.0 - 100.0 fL   MCH 31.5 26.0 - 34.0 pg   MCHC 34.2 32.0 - 36.0 g/dL   RDW 14.0 11.5 - 14.5 %   Platelets 142 (L) 150 - 440 K/uL   Neutrophils Relative % 50 %   Neutro Abs 3.1 1.4 - 6.5 K/uL   Lymphocytes Relative 40 %   Lymphs Abs 2.4 1.0 - 3.6 K/uL   Monocytes Relative 9 %   Monocytes Absolute 0.5 0.2 - 0.9 K/uL   Eosinophils Relative 1 %   Eosinophils Absolute 0.0 0 - 0.7 K/uL   Basophils Relative 0 %   Basophils Absolute 0.0 0 - 0.1 K/uL    Comment: Performed at [facility], [address] [place], [place] [address]  24 hr Ur UPEP/UIFE/Light Chains/TP     Status: Abnormal   Collection Time: 08/12/18 10:40 AM  Result Value Ref Range   Total Protein, Urine 20.0 Not Estab. mg/dL   Total Protein, Urine-Ur/day 430 (H) 30 - 150 mg/24 hr   Albumin, U 14.3 %   ALPHA 1 URINE 7.8 %   Alpha 2, Urine 20.6 %   % BETA, Urine 37.7 %   GAMMA GLOBULIN URINE 19.5 %   Free Kappa Lt Chains,Ur 395.00 (H) 1.35 - 24.19 mg/L    Comment: **Results verified by repeat testing**   Free Lambda Lt Chains,Ur 23.90 (H) 0.24 - 6.66 mg/L   Free Kappa/Lambda Ratio 16.53 (H) 2.04 - 10.37    Comment: (NOTE) Performed At: [facility] [place], [place] [id number] Rush Farmer MD [id number]    Immunofixation Result, Urine Comment     Comment: An apparent normal immunofixation pattern.   Total Volume 2,150     Comment: Performed at [facility], [place], [place] [address]   M-SPIKE %, Urine Not Observed Not Observed %   Note: Comment     Comment: (NOTE) Protein electrophoresis scan will follow via computer, mail, or courier delivery.   Hepatitis panel, acute     Status: None   Collection Time: 08/15/18 11:06 AM  Result Value Ref Range   Hepatitis B Surface Ag Negative Negative   HCV Ab <0.1 0.0 - 0.9 s/co ratio    Comment: (NOTE)                                  Negative:     < 0.8                             Indeterminate: 0.8 - 0.9                                  Positive:     > 0.9 The CDC recommends that a positive HCV antibody result be followed up with a HCV Nucleic Acid Amplification test (185631). Performed At: [facility] [place], [place] [id number] Rush Farmer MD [id number]    Hep A IgM Negative Negative   Hep B C IgM Negative Negative  Vitamin B12     Status: Abnormal   Collection Time: 08/15/18 11:06 AM  Result Value Ref Range   Vitamin B-12 129 (L) 180 - 914 pg/mL    Comment: (  NOTE) This assay is not validated for testing neonatal or myeloproliferative syndrome specimens for Vitamin B12 levels. Performed at [facility], [place] [address], [place], [place] [address]   Folate     Status: None   Collection Time: 08/15/18 11:06 AM  Result Value Ref Range   Folate 7.9 >5.9 ng/mL    Comment: Performed at [facility], [address] [place], [place] [address]  HIV antibody     Status: None   Collection Time: 08/15/18 11:06 AM  Result Value Ref Range   HIV Screen 4th Generation wRfx Non Reactive Non Reactive    Comment: (NOTE) Performed At: [facility] [place], [place] [id number] Rush Farmer MD  JE:[id number]   CBC with Differential/Platelet     Status: Abnormal   Collection Time: 08/15/18 11:06 AM  Result Value Ref Range   WBC 6.1 4.0 - 10.5 K/uL   RBC 3.91 3.87 - 5.11 MIL/uL   Hemoglobin 12.0 12.0 - 15.0 g/dL   HCT 36.4 36.0 - 46.0 %   MCV 93.1 80.0 - 100.0 fL   MCH 30.7 26.0 - 34.0 pg   MCHC 33.0 30.0 - 36.0 g/dL   RDW 14.0 11.5 - 15.5 %   Platelets 140 (L) 150 - 400 K/uL    Comment: REPEATED TO VERIFY SPECIMEN CHECKED FOR CLOTS    nRBC 0.0 0.0 - 0.2 %   Neutrophils Relative % 53 %   Neutro Abs 3.2 1.7 - 7.7 K/uL   Lymphocytes Relative 37 %   Lymphs Abs 2.3 0.7 - 4.0 K/uL   Monocytes Relative 9 %   Monocytes Absolute 0.6 0.1 - 1.0 K/uL   Eosinophils Relative 1 %   Eosinophils Absolute 0.1 0.0 - 0.5 K/uL   Basophils Relative 0 %   Basophils Absolute 0.0 0.0 - 0.1 K/uL    Comment: Performed at [facility], [address], [place], [place] [address]  Technologist smear review     Status: None   Collection Time: 08/15/18 11:06 AM  Result Value Ref Range   Tech Review WBC AND RBC MORPHOLOGY UNREMARKABLE     Comment: PLATELETS APPEAR ADEQUATE NORMAL PLATELET MORPHOLOGY Performed at [facility], [address] [place], [place] [address]      PHQ2/9: Depression screen [facility] 2/9 09/01/2018 07/25/2018 01/22/2018 01/22/2018 12/20/2017  Decreased Interest 0 0 0 0 0  Down, Depressed, Hopeless 0 0 0 0 0  PHQ - 2 Score 0 0 0 0 0  Altered sleeping 0 - - - -  Tired, decreased energy 0 - - - -  Change in appetite 0 - - - -  Feeling bad or failure about yourself  0 - - - -  Trouble concentrating 0 - - - -  Moving slowly or fidgety/restless 0 - - - -  Suicidal thoughts 0 - - - -  PHQ-9 Score 0 - - - -  Difficult doing work/chores Not difficult at all - - - -     Fall Risk: Fall Risk  09/01/2018 07/25/2018 01/22/2018 12/20/2017 08/14/2017  Falls in the past year? No No No No No      Assessment & Plan  1. Degenerative disc disease, lumbar  Doing  better  2. Right ovarian cyst  Explained no change in years, no reason to repeat and unlikely the cause of symptoms   3. Proteinuria, unspecified type  Seeing [name]  4. B12 deficiency  Getting B12 injections from the hematologist  5. Right lower quadrant pain  With change in bowel movement she has follow up with GI coming up   6. Atherosclerosis of abdominal aorta ([place])  Needs to repeat statin therapy   7. MGUS (monoclonal gammopathy of unknown significance)  Keep follow up with hematologist   8. Stage 3 chronic kidney disease ([place])  Keep follow up with nephrologist   9. Secondary hyperparathyroidism ([place])  Monitored by nephrologist

## 2018-09-08 ENCOUNTER — Other Ambulatory Visit: Payer: Self-pay

## 2018-09-08 ENCOUNTER — Inpatient Hospital Stay: Payer: Medicare Other | Attending: Oncology

## 2018-09-08 ENCOUNTER — Encounter: Payer: Self-pay | Admitting: Gastroenterology

## 2018-09-08 ENCOUNTER — Ambulatory Visit: Payer: Medicare Other | Admitting: Gastroenterology

## 2018-09-08 VITALS — BP 136/87 | HR 61 | Ht 65.0 in | Wt 174.6 lb

## 2018-09-08 DIAGNOSIS — R197 Diarrhea, unspecified: Secondary | ICD-10-CM

## 2018-09-08 DIAGNOSIS — R809 Proteinuria, unspecified: Secondary | ICD-10-CM | POA: Diagnosis not present

## 2018-09-08 DIAGNOSIS — E538 Deficiency of other specified B group vitamins: Secondary | ICD-10-CM

## 2018-09-08 DIAGNOSIS — N189 Chronic kidney disease, unspecified: Secondary | ICD-10-CM | POA: Insufficient documentation

## 2018-09-08 MED ORDER — CYANOCOBALAMIN 1000 MCG/ML IJ SOLN
1000.0000 ug | Freq: Once | INTRAMUSCULAR | Status: AC
  Administered 2018-09-08: 1000 ug via INTRAMUSCULAR

## 2018-09-08 NOTE — Progress Notes (Signed)
[name] MD, MRCP(U.K) [address]  [place]  [place], [place] [address]  Main: [phone]  Fax: [phone]   Gastroenterology Consultation  Referring Provider:     Steele Sizer, MD Primary Care Physician:  Steele Sizer, MD Primary Gastroenterologist:  Dr. [name]  Reason for Consultation:     Change in bowel habits         HPI:   Morgan Livingston is a 72 y.o. y/o female referred for consultation & management  by Dr. Ancil Boozer, Drue Stager, MD.    She has been referred for a change in bowel habits. Per referral "used to be constipated and now off medication, and having [place] 4-6 about 3 times daily"  Diarrhea :  Onset: 1 year- was having accidents- started taking meds later in the day , after her breakfast . Has slowed down- no leaking    Number of bowel movements a day : 2-3 times a day    Color : brown or green   Consistency:  Consistency of "gauconole"  Present status: ongoing    Shape of stool:  Some shape    Weight loss:  None  Prior colonoscopy:  20013 by Dr Jamal Collin - no polyps  - none  Artificial sugars/sodas/chewing gum:  Sweet tea over the weekends - bakes deserts - slice of desert    Bloating:  None   Gas:  None  Antibiotic use: none     Past Medical History:  Diagnosis Date  . Cancer [facility])    Uterine Cancer  . Hyperlipidemia   . Hypertension   . Kidney stones 06/21/2018    Past Surgical History:  Procedure Laterality Date  . ABDOMINAL HYSTERECTOMY    . BREAST BIOPSY Right 04/09/2006   negative  . BREAST BIOPSY Left 12/04/2017   fibroadenmatoid change with coarse calcs    Prior to Admission medications   Medication Sig Start Date End Date Taking? Authorizing Provider  aspirin 81 MG tablet Take 81 mg by mouth daily.    [provider]  Biotin 5 MG CAPS Take by mouth daily.    [provider]  cholecalciferol (VITAMIN D) 1000 UNITS tablet Take by mouth.    [provider]  fexofenadine (ALLEGRA) 180 MG  tablet Take 180 mg by mouth daily.    [provider]  fluticasone (FLONASE) 50 MCG/ACT nasal spray Place 2 sprays into both nostrils daily. 08/14/17   Steele Sizer, MD  losartan-hydrochlorothiazide (HYZAAR) 100-12.5 MG tablet TAKE 1 TABLET BY MOUTH EVERY DAY 08/18/18   Ancil Boozer, Drue Stager, MD  rosuvastatin (CRESTOR) 20 MG tablet TAKE 1 TABLET BY MOUTH EVERY DAY 08/17/18   Steele Sizer, MD    Family History  Problem Relation Age of Onset  . Congestive Heart Failure Mother 7  . Heart attack Father 40  . Sinusitis Sister   . Stomach cancer Sister   . Cancer Sister   . Dementia Sister        70  . Breast cancer Neg Hx      Social History   Tobacco Use  . Smoking status: Former Smoker    Packs/day: 1.00    Years: 2.00    Pack years: 2.00    Types: Cigarettes    Last attempt to quit: 1963    Years since quitting: 56.8  . Smokeless tobacco: Never Used  . Tobacco comment: smoking cessation materials not required  Substance Use Topics  . Alcohol use: No    Alcohol/week: 0.0 standard drinks  .  Drug use: No    Allergies as of 09/08/2018  . (No Known Allergies)    Review of Systems:    All systems reviewed and negative except where noted in HPI.   Physical Exam:  There were no vitals taken for this visit. No LMP recorded. Patient has had a hysterectomy. Psych:  Alert and cooperative. Normal mood and affect. General:   Alert,  Well-developed, well-nourished, pleasant and cooperative in NAD Head:  Normocephalic and atraumatic. Eyes:  Sclera clear, no icterus.   Conjunctiva pink. Ears:  Normal auditory acuity. Nose:  No deformity, discharge, or lesions. Mouth:  No deformity or lesions,oropharynx pink & moist. Neck:  Supple; no masses or thyromegaly. Lungs:  Respirations even and unlabored.  Clear throughout to auscultation.   No wheezes, crackles, or rhonchi. No acute distress. Heart:  Regular rate and rhythm; no murmurs, clicks, rubs, or gallops. Abdomen:   Normal bowel sounds.  No bruits.  Soft, non-tender and non-distended without masses, hepatosplenomegaly or hernias noted.  No guarding or rebound tenderness.    Neurologic:  Alert and oriented x3;  grossly normal neurologically. Skin:  Intact without significant lesions or rashes. No jaundice. Lymph Nodes:  No significant cervical adenopathy. Psych:  Alert and cooperative. Normal mood and affect.  Imaging Studies: No results found.  Assessment and Plan:   Morgan Livingston is a 72 y.o. y/o female has been referred for change in bowel habits   Plan  1. I plan to rule out infection with stool tests , if negative will proceed with a colonoscopy .  2. Celiac serology , fecal calprotectin .  I have discussed alternative options, risks & benefits,  which include, but are not limited to, bleeding, infection, perforation,respiratory complication & drug reaction.  The patient agrees with this plan & written consent will be obtained.     Follow up in 6 weeks   Dr [name] MD,MRCP(U.K)

## 2018-09-10 LAB — CELIAC DISEASE PANEL
ENDOMYSIAL IGA: NEGATIVE
IGA/IMMUNOGLOBULIN A, SERUM: 447 mg/dL — AB (ref 87–352)

## 2018-09-10 LAB — TSH: TSH: 0.658 u[IU]/mL (ref 0.450–4.500)

## 2018-09-13 LAB — CALPROTECTIN, FECAL: Calprotectin, Fecal: <16 ug/g (ref 0–120)

## 2018-09-13 LAB — GI PROFILE, STOOL, PCR
Adenovirus F 40/41: NOT DETECTED
Astrovirus: NOT DETECTED
C difficile toxin A/B: NOT DETECTED
CAMPYLOBACTER: NOT DETECTED
CRYPTOSPORIDIUM: NOT DETECTED
CYCLOSPORA CAYETANENSIS: NOT DETECTED
ENTEROAGGREGATIVE E COLI: NOT DETECTED
Entamoeba histolytica: NOT DETECTED
Enteropathogenic E coli: NOT DETECTED
Enterotoxigenic E coli: NOT DETECTED
Giardia lamblia: NOT DETECTED
NOROVIRUS GI/GII: NOT DETECTED
PLESIOMONAS SHIGELLOIDES: NOT DETECTED
ROTAVIRUS A: NOT DETECTED
SALMONELLA: NOT DETECTED
SAPOVIRUS: NOT DETECTED
Shiga-toxin-producing E coli: NOT DETECTED
Shigella/Enteroinvasive E coli: NOT DETECTED
VIBRIO: NOT DETECTED
Vibrio cholerae: NOT DETECTED
Yersinia enterocolitica: NOT DETECTED

## 2018-09-14 LAB — CLOSTRIDIUM DIFFICILE BY PCR: CDIFFPCR: NEGATIVE

## 2018-09-16 ENCOUNTER — Other Ambulatory Visit: Payer: Self-pay

## 2018-09-16 ENCOUNTER — Telehealth: Payer: Self-pay

## 2018-09-16 DIAGNOSIS — R197 Diarrhea, unspecified: Secondary | ICD-10-CM

## 2018-09-16 NOTE — Telephone Encounter (Signed)
Spoke with Morgan Livingston and informed her that her stool test results are normal. I reminded Morgan Livingston of Dr. Georgeann Oppenheim instructions to proceed with scheduling colonoscopy if stool labs show no infection. Morgan Livingston agrees and colonoscopy has been scheduled. Morgan Livingston is aware of prior prep instructions and has the printed instructions on hand.

## 2018-09-22 ENCOUNTER — Inpatient Hospital Stay: Payer: Medicare Other

## 2018-09-22 DIAGNOSIS — N189 Chronic kidney disease, unspecified: Secondary | ICD-10-CM | POA: Diagnosis not present

## 2018-09-22 DIAGNOSIS — E538 Deficiency of other specified B group vitamins: Secondary | ICD-10-CM

## 2018-09-22 MED ORDER — CYANOCOBALAMIN 1000 MCG/ML IJ SOLN
1000.0000 ug | Freq: Once | INTRAMUSCULAR | Status: AC
  Administered 2018-09-22: 1000 ug via INTRAMUSCULAR

## 2018-09-30 ENCOUNTER — Encounter: Payer: Self-pay | Admitting: *Deleted

## 2018-09-30 ENCOUNTER — Emergency Department
Admission: EM | Admit: 2018-09-30 | Discharge: 2018-09-30 | Disposition: A | Discharge status: Home / Self Care | Payer: Medicare Other | Attending: Student in an Organized Health Care Education/Training Program | Admitting: Student in an Organized Health Care Education/Training Program

## 2018-09-30 ENCOUNTER — Other Ambulatory Visit: Payer: Self-pay

## 2018-09-30 ENCOUNTER — Emergency Department: Payer: Medicare Other

## 2018-09-30 DIAGNOSIS — R109 Unspecified abdominal pain: Secondary | ICD-10-CM

## 2018-09-30 DIAGNOSIS — Z7982 Long term (current) use of aspirin: Secondary | ICD-10-CM | POA: Diagnosis not present

## 2018-09-30 DIAGNOSIS — R1031 Right lower quadrant pain: Secondary | ICD-10-CM | POA: Diagnosis not present

## 2018-09-30 DIAGNOSIS — I129 Hypertensive chronic kidney disease with stage 1 through stage 4 chronic kidney disease, or unspecified chronic kidney disease: Secondary | ICD-10-CM | POA: Insufficient documentation

## 2018-09-30 DIAGNOSIS — Z79899 Other long term (current) drug therapy: Secondary | ICD-10-CM | POA: Insufficient documentation

## 2018-09-30 DIAGNOSIS — Z87891 Personal history of nicotine dependence: Secondary | ICD-10-CM | POA: Insufficient documentation

## 2018-09-30 DIAGNOSIS — N183 Chronic kidney disease, stage 3 (moderate): Secondary | ICD-10-CM | POA: Insufficient documentation

## 2018-09-30 LAB — COMPREHENSIVE METABOLIC PANEL
ALBUMIN: 3.9 g/dL (ref 3.5–5.0)
ALT: 18 U/L (ref 0–44)
AST: 27 U/L (ref 15–41)
Alkaline Phosphatase: 57 U/L (ref 38–126)
Anion gap: 9 (ref 5–15)
BUN: 18 mg/dL (ref 8–23)
CHLORIDE: 104 mmol/L (ref 98–111)
CO2: 28 mmol/L (ref 22–32)
Calcium: 9.3 mg/dL (ref 8.9–10.3)
Creatinine, Ser: 1.18 mg/dL — ABNORMAL HIGH (ref 0.44–1.00)
GFR calc Af Amer: 53 mL/min — ABNORMAL LOW (ref >60)
GFR calc non Af Amer: 46 mL/min — ABNORMAL LOW (ref >60)
GLUCOSE: 115 mg/dL — AB (ref 70–99)
POTASSIUM: 4.1 mmol/L (ref 3.5–5.1)
SODIUM: 141 mmol/L (ref 135–145)
Total Bilirubin: 0.6 mg/dL (ref 0.3–1.2)
Total Protein: 7.6 g/dL (ref 6.5–8.1)

## 2018-09-30 LAB — URINALYSIS, COMPLETE (UACMP) WITH MICROSCOPIC
Bilirubin Urine: NEGATIVE
GLUCOSE, UA: NEGATIVE mg/dL
KETONES UR: NEGATIVE mg/dL
Nitrite: NEGATIVE
Protein, ur: NEGATIVE mg/dL
Specific Gravity, Urine: 1.004 — ABNORMAL LOW (ref 1.005–1.030)
pH: 6 (ref 5.0–8.0)

## 2018-09-30 LAB — CBC
HCT: 35.5 % — ABNORMAL LOW (ref 36.0–46.0)
HEMOGLOBIN: 11.7 g/dL — AB (ref 12.0–15.0)
MCH: 30.8 pg (ref 26.0–34.0)
MCHC: 33 g/dL (ref 30.0–36.0)
MCV: 93.4 fL (ref 80.0–100.0)
Platelets: 143 10*3/uL — ABNORMAL LOW (ref 150–400)
RBC: 3.8 MIL/uL — ABNORMAL LOW (ref 3.87–5.11)
RDW: 13.9 % (ref 11.5–15.5)
WBC: 7.6 10*3/uL (ref 4.0–10.5)
nRBC: 0 % (ref 0.0–0.2)

## 2018-09-30 MED ORDER — MORPHINE SULFATE (PF) 4 MG/ML IV SOLN
4.0000 mg | INTRAVENOUS | Status: DC | PRN
  Administered 2018-09-30: 4 mg via INTRAVENOUS
  Filled 2018-09-30: qty 1

## 2018-09-30 MED ORDER — TRAMADOL HCL 50 MG PO TABS: 50.0000 mg | ORAL_TABLET | Freq: Four times a day (QID) | ORAL | 0 refills | Status: DC | PRN

## 2018-09-30 MED ORDER — ONDANSETRON HCL 4 MG/2ML IJ SOLN
4.0000 mg | Freq: Once | INTRAMUSCULAR | Status: AC
  Administered 2018-09-30: 4 mg via INTRAVENOUS
  Filled 2018-09-30: qty 2

## 2018-09-30 MED ORDER — IOPAMIDOL (ISOVUE-300) INJECTION 61%
100.0000 mL | Freq: Once | INTRAVENOUS | Status: AC | PRN
  Administered 2018-09-30: 100 mL via INTRAVENOUS

## 2018-09-30 NOTE — ED Triage Notes (Signed)
Pt reports right side flank pain for 3 days.  No n/v.  No urinary sx.  Hx of kidney stones pt alert

## 2018-09-30 NOTE — ED Notes (Signed)
Pt is being discharged to home with husband. Pt is AOx4, VSS, she states the the pain had decreased since medication administration. AVS and RX was given and explained to the patient and the husband and they each verbalized understanding.

## 2018-09-30 NOTE — ED Provider Notes (Signed)
[facility] Emergency Department Provider Note    First MD Initiated Contact with Patient 09/30/18 0200     (approximate)  I have reviewed the triage vital signs and the nursing notes.   HISTORY  Chief Complaint Flank Pain    HPI Morgan Livingston is a 72 y.o. female history of uterine cancer as well as kidney stones presents the ER with progressively worsening right flank pain.  Denies any nausea vomiting or diarrhea.  No measured fevers.  States this feels slightly different from her previous kidney stones.  Denies any dysuria.  States the pain is mild to moderate in severity.    Past Medical History:  Diagnosis Date  . Cancer [facility])    Uterine Cancer  . Hyperlipidemia   . Hypertension   . Kidney stones 06/21/2018   Family History  Problem Relation Age of Onset  . Congestive Heart Failure Mother 20  . Heart attack Father 42  . Sinusitis Sister   . Stomach cancer Sister   . Cancer Sister   . Dementia Sister        65  . Breast cancer Neg Hx    Past Surgical History:  Procedure Laterality Date  . ABDOMINAL HYSTERECTOMY    . BREAST BIOPSY Right 04/09/2006   negative  . BREAST BIOPSY Left 12/04/2017   fibroadenmatoid change with coarse calcs   Patient Active Problem List   Diagnosis Date Noted  . MGUS (monoclonal gammopathy of unknown significance) 09/01/2018  . Secondary hyperparathyroidism ([place]) 09/01/2018  . B12 deficiency 08/19/2018  . Atherosclerosis of aorta ([place]) 07/25/2018  . Thrombocytopenia ([place]) 05/09/2018  . Proteinuria 05/09/2018  . Positive ANA (antinuclear antibody) 04/02/2018  . Bilateral dry eyes 04/02/2018  . History of hysterectomy 05/23/2015  . Chronic kidney disease, stage III (moderate) ([place]) 05/23/2015  . Benign hypertension 05/22/2015  . Chronic constipation 05/22/2015  . DDD (degenerative disc disease), lumbar 05/22/2015  . DD (diverticular disease) 05/22/2015  . Dyslipidemia 05/22/2015  . History of cervical  cancer 05/22/2015  . Helicobacter pylori gastrointestinal tract infection 05/22/2015  . Blood glucose elevated 05/22/2015  . Overweight (BMI 25.0-29.9) 05/22/2015  . Perennial allergic rhinitis with seasonal variation 05/22/2015  . Vitamin D deficiency 05/22/2015  . Impingement syndrome of shoulder 01/14/2015      Prior to Admission medications   Medication Sig Start Date End Date Taking? Authorizing Provider  aspirin 81 MG tablet Take 81 mg by mouth daily.    [provider]  Biotin 5 MG CAPS Take by mouth daily.    [provider]  cholecalciferol (VITAMIN D) 1000 UNITS tablet Take by mouth.    [provider]  fexofenadine (ALLEGRA) 180 MG tablet Take 180 mg by mouth daily.    [provider]  fluticasone (FLONASE) 50 MCG/ACT nasal spray Place 2 sprays into both nostrils daily. 08/14/17   Steele Sizer, MD  losartan-hydrochlorothiazide (HYZAAR) 100-12.5 MG tablet TAKE 1 TABLET BY MOUTH EVERY DAY 08/18/18   Ancil Boozer, Drue Stager, MD  rosuvastatin (CRESTOR) 20 MG tablet TAKE 1 TABLET BY MOUTH EVERY DAY 08/17/18   Steele Sizer, MD  traMADol (ULTRAM) 50 MG tablet Take 1 tablet (50 mg total) by mouth every 6 (six) hours as needed. 09/30/18 09/30/19  Merlyn Lot, MD    Allergies Patient has no known allergies.    Social History Social History   Tobacco Use  . Smoking status: Former Smoker    Packs/day: 1.00    Years: 2.00  Pack years: 2.00    Types: Cigarettes    Last attempt to quit: 1963    Years since quitting: 56.9  . Smokeless tobacco: Never Used  . Tobacco comment: smoking cessation materials not required  Substance Use Topics  . Alcohol use: No    Alcohol/week: 0.0 standard drinks  . Drug use: No    Review of Systems Patient denies headaches, rhinorrhea, blurry vision, numbness, shortness of breath, chest pain, edema, cough, abdominal pain, nausea, vomiting, diarrhea, dysuria, fevers, rashes or hallucinations unless  otherwise stated above in HPI. ____________________________________________   PHYSICAL EXAM:  VITAL SIGNS: Vitals:   09/30/18 0400 09/30/18 0415  BP: (!) 132/91   Pulse: (!) 56 (!) 59  Resp:    Temp:    SpO2: 97% 98%    Constitutional: Alert and oriented.  Eyes: Conjunctivae are normal.  Head: Atraumatic. Nose: No congestion/rhinnorhea. Mouth/Throat: Mucous membranes are moist.   Neck: No stridor. Painless ROM.  Cardiovascular: Normal rate, regular rhythm. Grossly normal heart sounds.  Good peripheral circulation. Respiratory: Normal respiratory effort.  No retractions. Lungs CTAB. Gastrointestinal: Soft with mild ttp in rlq.. No distention. No abdominal bruits. + right CVA tenderness. Genitourinary:  Musculoskeletal: No lower extremity tenderness nor edema.  No joint effusions. Neurologic:  Normal speech and language. No gross focal neurologic deficits are appreciated. No facial droop Skin:  Skin is warm, dry and intact. No rash noted. Psychiatric: Mood and affect are normal. Speech and behavior are normal.  ____________________________________________   LABS (all labs ordered are listed, but only abnormal results are displayed)  Results for orders placed or performed during the hospital encounter of 09/30/18 (from the past 24 hour(s))  Comprehensive metabolic panel     Status: Abnormal   Collection Time: 09/30/18 12:49 AM  Result Value Ref Range   Sodium 141 135 - 145 mmol/L   Potassium 4.1 3.5 - 5.1 mmol/L   Chloride 104 98 - 111 mmol/L   CO2 28 22 - 32 mmol/L   Glucose, Bld 115 (H) 70 - 99 mg/dL   BUN 18 8 - 23 mg/dL   Creatinine, Ser 1.18 (H) 0.44 - 1.00 mg/dL   Calcium 9.3 8.9 - 10.3 mg/dL   Total Protein 7.6 6.5 - 8.1 g/dL   Albumin 3.9 3.5 - 5.0 g/dL   AST 27 15 - 41 U/L   ALT 18 0 - 44 U/L   Alkaline Phosphatase 57 38 - 126 U/L   Total Bilirubin 0.6 0.3 - 1.2 mg/dL   GFR calc non Af Amer 46 (L) >60 mL/min   GFR calc Af Amer 53 (L) >60 mL/min   Anion  gap 9 5 - 15  CBC     Status: Abnormal   Collection Time: 09/30/18 12:49 AM  Result Value Ref Range   WBC 7.6 4.0 - 10.5 K/uL   RBC 3.80 (L) 3.87 - 5.11 MIL/uL   Hemoglobin 11.7 (L) 12.0 - 15.0 g/dL   HCT 35.5 (L) 36.0 - 46.0 %   MCV 93.4 80.0 - 100.0 fL   MCH 30.8 26.0 - 34.0 pg   MCHC 33.0 30.0 - 36.0 g/dL   RDW 13.9 11.5 - 15.5 %   Platelets 143 (L) 150 - 400 K/uL   nRBC 0.0 0.0 - 0.2 %  Urinalysis, Complete w Microscopic     Status: Abnormal   Collection Time: 09/30/18 12:49 AM  Result Value Ref Range   Color, Urine STRAW (A) YELLOW   APPearance HAZY (A) CLEAR  Specific Gravity, Urine 1.004 (L) 1.005 - 1.030   pH 6.0 5.0 - 8.0   Glucose, UA NEGATIVE NEGATIVE mg/dL   Hgb urine dipstick SMALL (A) NEGATIVE   Bilirubin Urine NEGATIVE NEGATIVE   Ketones, ur NEGATIVE NEGATIVE mg/dL   Protein, ur NEGATIVE NEGATIVE mg/dL   Nitrite NEGATIVE NEGATIVE   Leukocytes, UA SMALL (A) NEGATIVE   RBC / HPF 0-5 0 - 5 RBC/hpf   WBC, UA 6-10 0 - 5 WBC/hpf   Bacteria, UA RARE (A) NONE SEEN   Squamous Epithelial / LPF 6-10 0 - 5   Crystals PRESENT (A) NEGATIVE   ____________________________________________ ____________________________________________  RADIOLOGY  I personally reviewed all radiographic images ordered to evaluate for the above acute complaints and reviewed radiology reports and findings.  These findings were personally discussed with the patient.  Please see medical record for radiology report.  ____________________________________________   PROCEDURES  Procedure(s) performed:  Procedures    Critical Care performed: no ____________________________________________   INITIAL IMPRESSION / ASSESSMENT AND PLAN / ED COURSE  Pertinent labs & imaging results that were available during my care of the patient were reviewed by me and considered in my medical decision making (see chart for details).   DDX: stone, msk strain, uti, pyelo, colitis, appendicitis,  diverticulitis  ILLA ENLOW is a 72 y.o. who presents to the ED with symptoms as described above.  She is nontoxic but very uncomfortable appearing.  Clinically does not feel consistent with stone.  She denies any urinary tract symptoms.  Will provide IV fluids as I as well as IV pain medication.  She does have some mild tenderness on the right side and right flank given her age and risk factors will order CT imaging to exclude more insidious pathology.  Clinical Course as of Sep 30 545  Tue Sep 30, 2018  0420 Patient reassessed.  Repeat abdominal exam soft and benign.  No evidence of hydronephrosis, appendicitis or other acute intra-abdominal abnormality.  Denies any signs or symptoms of UTI therefore doubt [place] particularly in the absence of fever or white count.  Very well may have a small stone that just passed her ureteral colic.  At this point do believe patient stable and appropriate for outpatient follow-up.   [PR]    Clinical Course User Index [PR] Merlyn Lot, MD     As part of my medical decision making, I reviewed the following data within the [facility] notes reviewed and incorporated, Labs reviewed, notes from prior ED visits.  ____________________________________________   FINAL CLINICAL IMPRESSION(S) / ED DIAGNOSES  Final diagnoses:  Right flank pain      NEW MEDICATIONS STARTED DURING THIS VISIT:  Discharge Medication List as of 09/30/2018  4:25 AM    START taking these medications   Details  traMADol (ULTRAM) 50 MG tablet Take 1 tablet (50 mg total) by mouth every 6 (six) hours as needed., Starting Tue 09/30/2018, Until Wed 09/30/2019, Print         Note:  This document was prepared using Dragon voice recognition software and may include unintentional dictation errors.    Merlyn Lot, MD 09/30/18 [phone]

## 2018-09-30 NOTE — ED Notes (Signed)
Pt is AOx4, VSS, she c/o soreness in her abdomen rated at 2/10, she denies any N/V/D. Pt is on the monitor, call bell is within reach and family is at the bedside. We will continue to monitor the pt.

## 2018-09-30 NOTE — Discharge Instructions (Addendum)

## 2018-10-01 LAB — URINE CULTURE: CULTURE: NO GROWTH

## 2018-10-06 ENCOUNTER — Inpatient Hospital Stay: Payer: Medicare Other | Attending: Oncology

## 2018-10-06 ENCOUNTER — Encounter: Payer: Self-pay | Admitting: *Deleted

## 2018-10-06 DIAGNOSIS — E538 Deficiency of other specified B group vitamins: Secondary | ICD-10-CM

## 2018-10-06 MED ORDER — CYANOCOBALAMIN 1000 MCG/ML IJ SOLN
1000.0000 ug | Freq: Once | INTRAMUSCULAR | Status: AC
  Administered 2018-10-06: 1000 ug via INTRAMUSCULAR

## 2018-10-07 ENCOUNTER — Encounter
Admission: RE | Disposition: A | Discharge status: Home / Self Care | Payer: Self-pay | Source: Ambulatory Visit | Attending: Gastroenterology

## 2018-10-07 ENCOUNTER — Ambulatory Visit: Payer: Medicare Other | Admitting: Registered Nurse

## 2018-10-07 ENCOUNTER — Ambulatory Visit
Admission: RE | Admit: 2018-10-07 | Discharge: 2018-10-07 | Disposition: A | Discharge status: Home / Self Care | Payer: Medicare Other | Source: Ambulatory Visit | Attending: Gastroenterology | Admitting: Gastroenterology

## 2018-10-07 ENCOUNTER — Encounter: Payer: Self-pay | Admitting: *Deleted

## 2018-10-07 DIAGNOSIS — E785 Hyperlipidemia, unspecified: Secondary | ICD-10-CM | POA: Insufficient documentation

## 2018-10-07 DIAGNOSIS — Z87891 Personal history of nicotine dependence: Secondary | ICD-10-CM | POA: Insufficient documentation

## 2018-10-07 DIAGNOSIS — D122 Benign neoplasm of ascending colon: Secondary | ICD-10-CM | POA: Diagnosis not present

## 2018-10-07 DIAGNOSIS — D123 Benign neoplasm of transverse colon: Secondary | ICD-10-CM | POA: Diagnosis not present

## 2018-10-07 DIAGNOSIS — Z7982 Long term (current) use of aspirin: Secondary | ICD-10-CM | POA: Insufficient documentation

## 2018-10-07 DIAGNOSIS — I10 Essential (primary) hypertension: Secondary | ICD-10-CM | POA: Diagnosis not present

## 2018-10-07 DIAGNOSIS — Z7951 Long term (current) use of inhaled steroids: Secondary | ICD-10-CM | POA: Insufficient documentation

## 2018-10-07 DIAGNOSIS — K5730 Diverticulosis of large intestine without perforation or abscess without bleeding: Secondary | ICD-10-CM | POA: Diagnosis not present

## 2018-10-07 DIAGNOSIS — R197 Diarrhea, unspecified: Secondary | ICD-10-CM

## 2018-10-07 HISTORY — PX: COLONOSCOPY WITH PROPOFOL: SHX5780

## 2018-10-07 SURGERY — COLONOSCOPY WITH PROPOFOL
Anesthesia: General

## 2018-10-07 MED ORDER — PROPOFOL 500 MG/50ML IV EMUL
INTRAVENOUS | Status: DC | PRN
  Administered 2018-10-07: 150 ug/kg/min via INTRAVENOUS

## 2018-10-07 MED ORDER — LIDOCAINE HCL (PF) 2 % IJ SOLN
INTRAMUSCULAR | Status: AC
  Filled 2018-10-07: qty 10

## 2018-10-07 MED ORDER — PROPOFOL 500 MG/50ML IV EMUL
INTRAVENOUS | Status: AC
  Filled 2018-10-07: qty 50

## 2018-10-07 MED ORDER — LIDOCAINE HCL (CARDIAC) PF 100 MG/5ML IV SOSY
PREFILLED_SYRINGE | INTRAVENOUS | Status: DC | PRN
  Administered 2018-10-07: 40 mg via INTRAVENOUS

## 2018-10-07 MED ORDER — SODIUM CHLORIDE 0.9 % IV SOLN
INTRAVENOUS | Status: DC
  Administered 2018-10-07: 1000 mL via INTRAVENOUS

## 2018-10-07 MED ORDER — PROPOFOL 10 MG/ML IV BOLUS
INTRAVENOUS | Status: DC | PRN
  Administered 2018-10-07: 80 mg via INTRAVENOUS
  Administered 2018-10-07: 20 mg via INTRAVENOUS

## 2018-10-07 NOTE — Anesthesia Procedure Notes (Signed)
Date/Time: 10/07/2018 9:33 AM Performed by: Doreen Salvage, CRNA Pre-anesthesia Checklist: Patient identified, Emergency Drugs available, Suction available and Patient being monitored Patient Re-evaluated:Patient Re-evaluated prior to induction Oxygen Delivery Method: Nasal cannula Induction Type: IV induction Dental Injury: Teeth and Oropharynx as per pre-operative assessment  Comments: Nasal cannula with etCO2 monitoring

## 2018-10-07 NOTE — Anesthesia Preprocedure Evaluation (Signed)
Anesthesia Evaluation  Patient identified by MRN, date of birth, ID band Patient awake    Reviewed: Allergy & Precautions, H&P , NPO status , Patient's Chart, lab work & pertinent test results, reviewed documented beta blocker date and time   History of Anesthesia Complications Negative for: history of anesthetic complications  Airway Mallampati: II  TM Distance: >3 FB Neck ROM: full    Dental  (+) Dental Advidsory Given, Caps, Partial Lower, Missing, Teeth Intact   Pulmonary neg pulmonary ROS, former smoker,           Cardiovascular Exercise Tolerance: Good hypertension, (-) angina(-) CAD, (-) Past MI, (-) Cardiac Stents and (-) CABG (-) dysrhythmias (-) Valvular Problems/Murmurs     Neuro/Psych negative neurological ROS  negative psych ROS   GI/Hepatic negative GI ROS, Neg liver ROS,   Endo/Other  negative endocrine ROS  Renal/GU Renal disease (kidney stones)  negative genitourinary   Musculoskeletal   Abdominal   Peds  Hematology negative hematology ROS (+)   Anesthesia Other Findings Past Medical History: No date: Cancer ([place])     Comment:  Uterine Cancer No date: Hyperlipidemia No date: Hypertension 06/21/2018: Kidney stones   Reproductive/Obstetrics negative OB ROS                             Anesthesia Physical Anesthesia Plan  ASA: II  Anesthesia Plan: General   Post-op Pain Management:    Induction: Intravenous  PONV Risk Score and Plan: 3 and Propofol infusion and TIVA  Airway Management Planned: Natural Airway and Nasal Cannula  Additional Equipment:   Intra-op Plan:   Post-operative Plan:   Informed Consent: I have reviewed the patients History and Physical, chart, labs and discussed the procedure including the risks, benefits and alternatives for the proposed anesthesia with the patient or authorized representative who has indicated his/her understanding and  acceptance.   Dental Advisory Given  Plan Discussed with: Anesthesiologist, CRNA and Surgeon  Anesthesia Plan Comments:         Anesthesia Quick Evaluation

## 2018-10-07 NOTE — H&P (Signed)
[name], MD [address], [place], [place], [place], [address] [address], [place], [place], [place], [address] Phone: [phone]  Fax: [phone]  Primary Care Physician:  Steele Sizer, MD   Pre-Procedure History & Physical: HPI:  Morgan Livingston is a 72 y.o. female is here for an colonoscopy.   Past Medical History:  Diagnosis Date  . Cancer [facility])    Uterine Cancer  . Hyperlipidemia   . Hypertension   . Kidney stones 06/21/2018    Past Surgical History:  Procedure Laterality Date  . ABDOMINAL HYSTERECTOMY    . BREAST BIOPSY Right 04/09/2006   negative  . BREAST BIOPSY Left 12/04/2017   fibroadenmatoid change with coarse calcs    Prior to Admission medications   Medication Sig Start Date End Date Taking? Authorizing Provider  aspirin 81 MG tablet Take 81 mg by mouth daily.    [provider]  Biotin 5 MG CAPS Take by mouth daily.    [provider]  cholecalciferol (VITAMIN D) 1000 UNITS tablet Take by mouth.    [provider]  fexofenadine (ALLEGRA) 180 MG tablet Take 180 mg by mouth daily.    [provider]  fluticasone (FLONASE) 50 MCG/ACT nasal spray Place 2 sprays into both nostrils daily. 08/14/17   Steele Sizer, MD  losartan-hydrochlorothiazide (HYZAAR) 100-12.5 MG tablet TAKE 1 TABLET BY MOUTH EVERY DAY 08/18/18   Ancil Boozer, Drue Stager, MD  rosuvastatin (CRESTOR) 20 MG tablet TAKE 1 TABLET BY MOUTH EVERY DAY 08/17/18   Steele Sizer, MD  traMADol (ULTRAM) 50 MG tablet Take 1 tablet (50 mg total) by mouth every 6 (six) hours as needed. 09/30/18 09/30/19  Merlyn Lot, MD    Allergies as of 09/17/2018  . (No Known Allergies)    Family History  Problem Relation Age of Onset  . Congestive Heart Failure Mother 57  . Heart attack Father 60  . Sinusitis Sister   . Stomach cancer Sister   . Cancer Sister   . Dementia Sister        65  . Breast cancer Neg Hx     Social History   Socioeconomic  History  . Marital status: Married    Spouse name: Paramedic  . Number of children: 2  . Years of education: some college  . Highest education level: 12th grade  Occupational History  . 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 Financial resource strain: Not hard at all  . Food insecurity:    Worry: Never true    Inability: Never true  . Transportation needs:    Medical: No    Non-medical: No  Tobacco Use  . Smoking status: Former Smoker    Packs/day: 1.00    Years: 2.00    Pack years: 2.00    Types: Cigarettes    Last attempt to quit: 1963    Years since quitting: 56.9  . Smokeless tobacco: Never Used  . Tobacco comment: smoking cessation materials not required  Substance and Sexual Activity  . Alcohol use: No    Alcohol/week: 0.0 standard drinks  . Drug use: No  . Sexual activity: Yes    Partners: Male    Birth control/protection: Post-menopausal  Lifestyle  . Physical activity:    Days per week: 7 days    Minutes per session: 30 min  . Stress: Not at all  Relationships  . Social connections:    Talks on phone:  More than three times a week    Gets together: More than three times a week    Attends religious service: More than 4 times per year    Active member of club or organization: Yes    Attends meetings of clubs or organizations: More than 4 times per year    Relationship status: Married  . Intimate partner violence:    Fear of current or ex partner: No    Emotionally abused: No    Physically abused: No    Forced sexual activity: No  Other Topics Concern  . Not on file  Social History Narrative   Married for 50 years     Review of Systems: See HPI, otherwise negative ROS  Physical Exam: BP (!) 153/91   Pulse 66   Temp (!) 97.5 F (36.4 C) (Tympanic)   Resp 18   Ht 5\' 5"  (1.651 m)   Wt 77.1 kg   SpO2 100%   BMI 28.29 kg/m  General:   Alert,  pleasant and cooperative in NAD Head:  Normocephalic and  atraumatic. Neck:  Supple; no masses or thyromegaly. Lungs:  Clear throughout to auscultation, normal respiratory effort.    Heart:  +S1, +S2, Regular rate and rhythm, No edema. Abdomen:  Soft, nontender and nondistended. Normal bowel sounds, without guarding, and without rebound.   Neurologic:  Alert and  oriented x4;  grossly normal neurologically.  Impression/Plan: Morgan Livingston is here for an colonoscopy to be performed for diarrhea Risks, benefits, limitations, and alternatives regarding  colonoscopy have been reviewed with the patient.  Questions have been answered.  All parties agreeable.   [name], MD  10/07/2018, 9:17 AM

## 2018-10-07 NOTE — Anesthesia Post-op Follow-up Note (Signed)
Anesthesia QCDR form completed.        

## 2018-10-07 NOTE — Transfer of Care (Signed)
Immediate Anesthesia Transfer of Care Note  Patient: Morgan Livingston  Procedure(s) Performed: Procedure(s): COLONOSCOPY WITH PROPOFOL (N/A)  Patient Location: PACU and Endoscopy Unit  Anesthesia Type:General  Level of Consciousness: sedated  Airway & Oxygen Therapy: Patient Spontanous Breathing and Patient connected to nasal cannula oxygen  Post-op Assessment: Report given to RN and Post -op Vital signs reviewed and stable  Post vital signs: Reviewed and stable  Last Vitals:  Vitals:   10/07/18 0904 10/07/18 1003  BP: (!) 153/91 110/69  Pulse: 66 66  Resp: 18 13  Temp: (!) 36.4 C (!) 36 C  SpO2: 845% 36%    Complications: No apparent anesthesia complications

## 2018-10-07 NOTE — Op Note (Signed)
[facility] Patient Name: Morgan Livingston Procedure Date: 10/07/2018 9:32 AM MRN: [id number] Account #: [id number] Date of Birth: 07/25/1946 Admit Type: Outpatient Age: 72 Room: [facility] ENDO ROOM 1 Gender: Female Note Status: Finalized Procedure:            Colonoscopy Indications:          Clinically significant diarrhea of unexplained origin Providers:            [name] MD, MD Referring MD:         Bethena Roys. Sowles, MD (Referring MD) Medicines:            Monitored Anesthesia Care Complications:        No immediate complications. Procedure:            Pre-Anesthesia Assessment:                       - Prior to the procedure, a History and Physical was                        performed, and patient medications, allergies and                        sensitivities were reviewed. The patient's tolerance of                        previous anesthesia was reviewed.                       - The risks and benefits of the procedure and the                        sedation options and risks were discussed with the                        patient. All questions were answered and informed                        consent was obtained.                       - ASA Grade Assessment: III - A patient with severe                        systemic disease.                       After obtaining informed consent, the colonoscope was                        passed under direct vision. Throughout the procedure,                        the patient's blood pressure, pulse, and oxygen                        saturations were monitored continuously. The                        Colonoscope was introduced through the anus and  advanced to the the cecum, identified by the                        appendiceal orifice, IC valve and transillumination.                        The colonoscopy was performed with ease. The patient                        tolerated the  procedure well. The quality of the bowel                        preparation was good. Findings:      The perianal and digital rectal examinations were normal.      Three sessile polyps were found in the ascending colon. The polyps were       5 to 8 mm in size. These polyps were removed with a cold snare.       Resection and retrieval were complete.      Three sessile polyps were found in the transverse colon. The polyps were       4 to 7 mm in size. These polyps were removed with a cold snare.       Resection and retrieval were complete.      Multiple small-mouthed diverticula were found in the sigmoid colon.      The exam was otherwise without abnormality on direct and retroflexion       views.      The colon (entire examined portion) appeared normal. Biopsies for       histology were taken with a cold forceps from the entire colon for       evaluation of microscopic colitis. Impression:           - Three 5 to 8 mm polyps in the ascending colon,                        removed with a cold snare. Resected and retrieved.                       - Three 4 to 7 mm polyps in the transverse colon,                        removed with a cold snare. Resected and retrieved.                       - Diverticulosis in the sigmoid colon.                       - The examination was otherwise normal on direct and                        retroflexion views. Recommendation:       - Discharge patient to home (with escort).                       - Resume previous diet.                       - Continue present medications.                       -  Await pathology results.                       - Repeat colonoscopy in 3 years for surveillance.                       - Return to my office in 8 weeks. Procedure Code(s):    --- Professional ---                       [phone], Colonoscopy, flexible; with removal of tumor(s),                        polyp(s), or other lesion(s) by snare technique                        [id number], 79, Colonoscopy, flexible; with biopsy, single                        or multiple Diagnosis Code(s):    --- Professional ---                       D12.2, Benign neoplasm of ascending colon                       D12.3, Benign neoplasm of transverse colon (hepatic                        flexure or splenic flexure)                       R19.7, Diarrhea, unspecified                       K57.30, Diverticulosis of large intestine without                        perforation or abscess without bleeding CPT copyright 2018 American Medical Association. All rights reserved. The codes documented in this report are preliminary and upon coder review may  be revised to meet current compliance requirements. [name], MD [name] MD, MD 10/07/2018 10:00:53 AM This report has been signed electronically. Number of Addenda: 0 Note Initiated On: 10/07/2018 9:32 AM Scope Withdrawal Time: 0 hours 14 minutes 27 seconds  Total Procedure Duration: 0 hours 22 minutes 45 seconds       [facility]

## 2018-10-07 NOTE — Anesthesia Postprocedure Evaluation (Signed)
Anesthesia Post Note  Patient: Morgan Livingston  Procedure(s) Performed: COLONOSCOPY WITH PROPOFOL (N/A )  Patient location during evaluation: Endoscopy Anesthesia Type: General Level of consciousness: awake and alert Pain management: pain level controlled Vital Signs Assessment: post-procedure vital signs reviewed and stable Respiratory status: spontaneous breathing, nonlabored ventilation, respiratory function stable and patient connected to nasal cannula oxygen Cardiovascular status: blood pressure returned to baseline and stable Postop Assessment: no apparent nausea or vomiting Anesthetic complications: no     Last Vitals:  Vitals:   10/07/18 1023 10/07/18 1033  BP: 131/88 (!) 142/74  Pulse: (!) 53 (!) 54  Resp: 15 19  Temp:    SpO2: 100% 99%    Last Pain:  Vitals:   10/07/18 1033  TempSrc:   PainSc: 0-No pain                 Martha Clan

## 2018-10-08 ENCOUNTER — Encounter: Payer: Self-pay | Admitting: Gastroenterology

## 2018-10-08 LAB — SURGICAL PATHOLOGY

## 2018-10-20 ENCOUNTER — Inpatient Hospital Stay: Payer: Medicare Other

## 2018-10-20 DIAGNOSIS — E538 Deficiency of other specified B group vitamins: Secondary | ICD-10-CM | POA: Diagnosis not present

## 2018-10-20 MED ORDER — CYANOCOBALAMIN 1000 MCG/ML IJ SOLN
1000.0000 ug | Freq: Once | INTRAMUSCULAR | Status: AC
  Administered 2018-10-20: 1000 ug via INTRAMUSCULAR

## 2018-10-24 ENCOUNTER — Encounter: Payer: Self-pay | Admitting: Family Medicine

## 2018-10-24 ENCOUNTER — Ambulatory Visit: Payer: Medicare Other | Admitting: Family Medicine

## 2018-10-24 VITALS — BP 130/80 | HR 63 | Temp 97.5°F | Resp 16 | Ht 65.0 in | Wt 171.7 lb

## 2018-10-24 DIAGNOSIS — M5136 Other intervertebral disc degeneration, lumbar region: Secondary | ICD-10-CM

## 2018-10-24 DIAGNOSIS — Z87898 Personal history of other specified conditions: Secondary | ICD-10-CM | POA: Diagnosis not present

## 2018-10-24 MED ORDER — GABAPENTIN 100 MG PO CAPS: 100.0000 mg | ORAL_CAPSULE | Freq: Every day | ORAL | 3 refills | Status: DC

## 2018-10-24 NOTE — Progress Notes (Addendum)
Name: Morgan Livingston   MRN: [id number]    DOB: 26-Jan-1946   Date:10/24/2018       Progress Note  Subjective  Chief Complaint  Chief Complaint  Patient presents with  . Hospitalization Follow-up    recurrent back pain, dx undetermined.    HPI  EC follow up: she went to [facility] on 11/26 2019 because she developed right lower back pain that was radiating to right lower quadrant, she was worried about appendicitis. Pain started gradually four days prior, it was dull, aching but sometimes sharp. She had CT scan that was negative and was morphine and symptoms improved, she was given pain medication and symptoms resolved within a couple of days. She has mild pain intermittently. She has a history of DDD, she has a history of radiculitis in the past. The week that it happened her sister diet and also helped with funeral of a friend, she was cooking a lot and moving all the time.   Patient Active Problem List   Diagnosis Date Noted  . MGUS (monoclonal gammopathy of unknown significance) 09/01/2018  . Secondary hyperparathyroidism ([place]) 09/01/2018  . B12 deficiency 08/19/2018  . Atherosclerosis of aorta ([place]) 07/25/2018  . Thrombocytopenia ([place]) 05/09/2018  . Proteinuria 05/09/2018  . Positive ANA (antinuclear antibody) 04/02/2018  . Bilateral dry eyes 04/02/2018  . History of hysterectomy 05/23/2015  . Chronic kidney disease, stage III (moderate) ([place]) 05/23/2015  . Benign hypertension 05/22/2015  . Chronic constipation 05/22/2015  . DDD (degenerative disc disease), lumbar 05/22/2015  . DD (diverticular disease) 05/22/2015  . Dyslipidemia 05/22/2015  . History of cervical cancer 05/22/2015  . Helicobacter pylori gastrointestinal tract infection 05/22/2015  . Blood glucose elevated 05/22/2015  . Overweight (BMI 25.0-29.9) 05/22/2015  . Perennial allergic rhinitis with seasonal variation 05/22/2015  . Vitamin D deficiency 05/22/2015  . Impingement syndrome of shoulder 01/14/2015    Past  Surgical History:  Procedure Laterality Date  . ABDOMINAL HYSTERECTOMY    . BREAST BIOPSY Right 04/09/2006   negative  . BREAST BIOPSY Left 12/04/2017   fibroadenmatoid change with coarse calcs  . COLONOSCOPY WITH PROPOFOL N/A 10/07/2018   Procedure: COLONOSCOPY WITH PROPOFOL;  Surgeon: [name], MD;  Location: [facility] ENDOSCOPY;  Service: Gastroenterology;  Laterality: N/A;    Family History  Problem Relation Age of Onset  . Congestive Heart Failure Mother 4  . Heart attack Father 54  . Sinusitis Sister   . Stomach cancer Sister   . Cancer Sister   . Dementia Sister        73  . Breast cancer Neg Hx     Social History   Socioeconomic History  . Marital status: Married    Spouse name: Paramedic  . Number of children: 2  . Years of education: some college  . Highest education level: 12th grade  Occupational History  . 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 Financial resource strain: Not hard at all  . Food insecurity:    Worry: Never true    Inability: Never true  . Transportation needs:    Medical: No    Non-medical: No  Tobacco Use  . Smoking status: Former Smoker    Packs/day: 1.00    Years: 2.00    Pack years: 2.00    Types: Cigarettes    Last attempt to quit: 1963    Years since quitting: 57.0  . Smokeless tobacco: Never Used  . Tobacco comment: smoking cessation  materials not required  Substance and Sexual Activity  . Alcohol use: No    Alcohol/week: 0.0 standard drinks  . Drug use: No  . Sexual activity: Yes    Partners: Male    Birth control/protection: Post-menopausal  Lifestyle  . Physical activity:    Days per week: 7 days    Minutes per session: 30 min  . Stress: Not at all  Relationships  . Social connections:    Talks on phone: More than three times a week    Gets together: More than three times a week    Attends religious service: More than 4 times per year    Active member of club or organization: Yes     Attends meetings of clubs or organizations: More than 4 times per year    Relationship status: Married  . Intimate partner violence:    Fear of current or ex partner: No    Emotionally abused: No    Physically abused: No    Forced sexual activity: No  Other Topics Concern  . Not on file  Social History Narrative   Married for 50 years      Current Outpatient Medications:  .  aspirin 81 MG tablet, Take 81 mg by mouth daily., Disp: , Rfl:  .  Biotin 5 MG CAPS, Take by mouth daily., Disp: , Rfl:  .  cholecalciferol (VITAMIN D) 1000 UNITS tablet, Take by mouth., Disp: , Rfl:  .  fexofenadine (ALLEGRA) 180 MG tablet, Take 180 mg by mouth daily., Disp: , Rfl:  .  fluticasone (FLONASE) 50 MCG/ACT nasal spray, Place 2 sprays into both nostrils daily., Disp: 16 g, Rfl: 0 .  losartan-hydrochlorothiazide (HYZAAR) 100-12.5 MG tablet, TAKE 1 TABLET BY MOUTH EVERY DAY, Disp: 90 tablet, Rfl: 0 .  rosuvastatin (CRESTOR) 20 MG tablet, TAKE 1 TABLET BY MOUTH EVERY DAY, Disp: 90 tablet, Rfl: 1 .  traMADol (ULTRAM) 50 MG tablet, Take 1 tablet (50 mg total) by mouth every 6 (six) hours as needed., Disp: 6 tablet, Rfl: 0  No Known Allergies  I personally reviewed active problem list, medication list, allergies, family history, social history with the patient/caregiver today.   ROS  Constitutional: Negative for fever or weight change.  Respiratory: Negative for cough and shortness of breath.   Cardiovascular: Negative for chest pain or palpitations.  Gastrointestinal: Negative for abdominal pain, no bowel changes.  Musculoskeletal: Negative for gait problem or joint swelling.  Skin: Negative for rash.  Neurological: Negative for dizziness or headache.  No other specific complaints in a complete review of systems (except as listed in HPI above).  Objective  Vitals:   10/24/18 1059  BP: 130/80  Pulse: 63  Resp: 16  Temp: (!) 97.5 F (36.4 C)  TempSrc: Oral  SpO2: 98%  Weight: 171 lb 11.2 oz  (77.9 kg)  Height: _0  (1.651 m)    Body mass index is 28.57 kg/m.  Physical Exam  Constitutional: Patient appears well-developed and well-nourished. Overweight.  No distress.  HEENT: head atraumatic, normocephalic, pupils equal and reactive to light,  neck supple, throat within normal limits Cardiovascular: Normal rate, regular rhythm and normal heart sounds.  No murmur heard. No BLE edema. Pulmonary/Chest: Effort normal and breath sounds normal. No respiratory distress. Abdominal: Soft.  There is no tenderness. Muscular Skeletal: negative straight leg raise, pain during palpation of right sacro iliac joint Psychiatric: Patient has a normal mood and affect. behavior is normal. Judgment and thought content normal.  Recent Results (  from the past 2160 hour(s))  CBC with Differential/Platelet     Status: Abnormal   Collection Time: 08/08/18 11:39 AM  Result Value Ref Range   WBC 6.1 3.6 - 11.0 K/uL   RBC 3.90 3.80 - 5.20 MIL/uL   Hemoglobin 12.3 12.0 - 16.0 g/dL   HCT 35.9 35.0 - 47.0 %   MCV 92.1 80.0 - 100.0 fL   MCH 31.5 26.0 - 34.0 pg   MCHC 34.2 32.0 - 36.0 g/dL   RDW 14.0 11.5 - 14.5 %   Platelets 142 (L) 150 - 440 K/uL   Neutrophils Relative % 50 %   Neutro Abs 3.1 1.4 - 6.5 K/uL   Lymphocytes Relative 40 %   Lymphs Abs 2.4 1.0 - 3.6 K/uL   Monocytes Relative 9 %   Monocytes Absolute 0.5 0.2 - 0.9 K/uL   Eosinophils Relative 1 %   Eosinophils Absolute 0.0 0 - 0.7 K/uL   Basophils Relative 0 %   Basophils Absolute 0.0 0 - 0.1 K/uL    Comment: Performed at [facility], [address] [place], [place] [address]  24 hr Ur UPEP/UIFE/Light Chains/TP     Status: Abnormal   Collection Time: 08/12/18 10:40 AM  Result Value Ref Range   Total Protein, Urine 20.0 Not Estab. mg/dL   Total Protein, Urine-Ur/day 430 (H) 30 - 150 mg/24 hr   Albumin, U 14.3 %   ALPHA 1 URINE 7.8 %   Alpha 2, Urine 20.6 %   % BETA, Urine 37.7 %   GAMMA GLOBULIN URINE 19.5 %   Free  Kappa Lt Chains,Ur 395.00 (H) 1.35 - 24.19 mg/L    Comment: **Results verified by repeat testing**   Free Lambda Lt Chains,Ur 23.90 (H) 0.24 - 6.66 mg/L   Free Kappa/Lambda Ratio 16.53 (H) 2.04 - 10.37    Comment: (NOTE) Performed At: [facility] [place], [place] [id number] Rush Farmer MD [id number]    Immunofixation Result, Urine Comment     Comment: An apparent normal immunofixation pattern.   Total Volume 2,150     Comment: Performed at [facility], [place], [place] [address]   M-SPIKE %, Urine Not Observed Not Observed %   Note: Comment     Comment: (NOTE) Protein electrophoresis scan will follow via computer, mail, or courier delivery.   Hepatitis panel, acute     Status: None   Collection Time: 08/15/18 11:06 AM  Result Value Ref Range   Hepatitis B Surface Ag Negative Negative   HCV Ab <0.1 0.0 - 0.9 s/co ratio    Comment: (NOTE)                                  Negative:     < 0.8                             Indeterminate: 0.8 - 0.9                                  Positive:     > 0.9 The CDC recommends that a positive HCV antibody result be followed up with a HCV Nucleic Acid Amplification test (078675). Performed At: [facility] [place], [place] [id number] Rush Farmer MD [id number]  Hep A IgM Negative Negative   Hep B C IgM Negative Negative  Vitamin B12     Status: Abnormal   Collection Time: 08/15/18 11:06 AM  Result Value Ref Range   Vitamin B-12 129 (L) 180 - 914 pg/mL    Comment: (NOTE) This assay is not validated for testing neonatal or myeloproliferative syndrome specimens for Vitamin B12 levels. Performed at [facility], [place] [address], [place], [place] [address]   Folate     Status: None   Collection Time: 08/15/18 11:06 AM  Result Value Ref Range   Folate 7.9 >5.9 ng/mL    Comment: Performed at [facility], [address] [place], [place]  [address]  HIV antibody     Status: None   Collection Time: 08/15/18 11:06 AM  Result Value Ref Range   HIV Screen 4th Generation wRfx Non Reactive Non Reactive    Comment: (NOTE) Performed At: [facility] [place], [place] [id number] Rush Farmer MD [id number]   CBC with Differential/Platelet     Status: Abnormal   Collection Time: 08/15/18 11:06 AM  Result Value Ref Range   WBC 6.1 4.0 - 10.5 K/uL   RBC 3.91 3.87 - 5.11 MIL/uL   Hemoglobin 12.0 12.0 - 15.0 g/dL   HCT 36.4 36.0 - 46.0 %   MCV 93.1 80.0 - 100.0 fL   MCH 30.7 26.0 - 34.0 pg   MCHC 33.0 30.0 - 36.0 g/dL   RDW 14.0 11.5 - 15.5 %   Platelets 140 (L) 150 - 400 K/uL    Comment: REPEATED TO VERIFY SPECIMEN CHECKED FOR CLOTS    nRBC 0.0 0.0 - 0.2 %   Neutrophils Relative % 53 %   Neutro Abs 3.2 1.7 - 7.7 K/uL   Lymphocytes Relative 37 %   Lymphs Abs 2.3 0.7 - 4.0 K/uL   Monocytes Relative 9 %   Monocytes Absolute 0.6 0.1 - 1.0 K/uL   Eosinophils Relative 1 %   Eosinophils Absolute 0.1 0.0 - 0.5 K/uL   Basophils Relative 0 %   Basophils Absolute 0.0 0.0 - 0.1 K/uL    Comment: Performed at [facility], [address] [place], [place] [address]  Technologist smear review     Status: None   Collection Time: 08/15/18 11:06 AM  Result Value Ref Range   Tech Review WBC AND RBC MORPHOLOGY UNREMARKABLE     Comment: PLATELETS APPEAR ADEQUATE NORMAL PLATELET MORPHOLOGY Performed at [facility], [address] [place], [place] [address]   Celiac Disease Panel     Status: Abnormal   Collection Time: 09/08/18  2:10 PM  Result Value Ref Range   Endomysial IgA Negative Negative   Transglutaminase IgA <2 0 - 3 U/mL    Comment:                               Negative        0 -  3                               Weak Positive   4 - 10                               Positive           >10  Tissue Transglutaminase (tTG) has been identified  as the endomysial antigen.  Studies have  demonstr-  ated that endomysial IgA antibodies have over 99%  specificity for gluten sensitive enteropathy.    IgA/Immunoglobulin A, Serum 447 (H) 87 - 352 mg/dL  TSH     Status: None   Collection Time: 09/08/18  2:10 PM  Result Value Ref Range   TSH 0.658 0.450 - 4.500 uIU/mL  Clostridium Difficile by PCR([facility])     Status: None   Collection Time: 09/10/18  8:45 AM  Result Value Ref Range   Toxigenic C. Difficile by PCR Negative Negative  GI Profile, Stool, PCR     Status: None   Collection Time: 09/10/18  8:45 AM  Result Value Ref Range   Campylobacter Not Detected Not Detected   C difficile toxin A/B Not Detected Not Detected   Plesiomonas shigelloides Not Detected Not Detected   Salmonella Not Detected Not Detected   Vibrio Not Detected Not Detected   Vibrio cholerae Not Detected Not Detected   Yersinia enterocolitica Not Detected Not Detected   Enteroaggregative E coli Not Detected Not Detected   Enteropathogenic E coli Not Detected Not Detected   Enterotoxigenic E coli Not Detected Not Detected   Shiga-toxin-producing E coli Not Detected Not Detected   E coli K742 Not applicable Not Detected   Shigella/Enteroinvasive E coli Not Detected Not Detected   Cryptosporidium Not Detected Not Detected   Cyclospora cayetanensis Not Detected Not Detected   Entamoeba histolytica Not Detected Not Detected   Giardia lamblia Not Detected Not Detected   Adenovirus F 40/41 Not Detected Not Detected   Astrovirus Not Detected Not Detected   Norovirus GI/GII Not Detected Not Detected   Rotavirus A Not Detected Not Detected   Sapovirus Not Detected Not Detected  Calprotectin, Fecal     Status: None   Collection Time: 09/10/18  8:45 AM  Result Value Ref Range   Calprotectin, Fecal <16 0 - 120 ug/g    Comment: Concentration     Interpretation   Follow-Up <16 - 50 ug/g     Normal           None >50 -120 ug/g     Borderline       Re-evaluate in 4-6 weeks     >120 ug/g      Abnormal         Repeat as clinically                                    indicated   Comprehensive metabolic panel     Status: Abnormal   Collection Time: 09/30/18 12:49 AM  Result Value Ref Range   Sodium 141 135 - 145 mmol/L   Potassium 4.1 3.5 - 5.1 mmol/L   Chloride 104 98 - 111 mmol/L   CO2 28 22 - 32 mmol/L   Glucose, Bld 115 (H) 70 - 99 mg/dL   BUN 18 8 - 23 mg/dL   Creatinine, Ser 1.18 (H) 0.44 - 1.00 mg/dL   Calcium 9.3 8.9 - 10.3 mg/dL   Total Protein 7.6 6.5 - 8.1 g/dL   Albumin 3.9 3.5 - 5.0 g/dL   AST 27 15 - 41 U/L   ALT 18 0 - 44 U/L   Alkaline Phosphatase 57 38 - 126 U/L   Total Bilirubin 0.6 0.3 - 1.2 mg/dL   GFR calc non Af Amer 46 (L) >  60 mL/min   GFR calc Af Amer 53 (L) >60 mL/min    Comment: (NOTE) The eGFR has been calculated using the CKD EPI equation. This calculation has not been validated in all clinical situations. eGFR's persistently <60 mL/min signify possible Chronic Kidney Disease.    Anion gap 9 5 - 15    Comment: Performed at [facility], [address] [place], [place] [address]  CBC     Status: Abnormal   Collection Time: 09/30/18 12:49 AM  Result Value Ref Range   WBC 7.6 4.0 - 10.5 K/uL   RBC 3.80 (L) 3.87 - 5.11 MIL/uL   Hemoglobin 11.7 (L) 12.0 - 15.0 g/dL   HCT 35.5 (L) 36.0 - 46.0 %   MCV 93.4 80.0 - 100.0 fL   MCH 30.8 26.0 - 34.0 pg   MCHC 33.0 30.0 - 36.0 g/dL   RDW 13.9 11.5 - 15.5 %   Platelets 143 (L) 150 - 400 K/uL   nRBC 0.0 0.0 - 0.2 %    Comment: Performed at [facility], [address] [place], [place] [address]  Urinalysis, Complete w Microscopic     Status: Abnormal   Collection Time: 09/30/18 12:49 AM  Result Value Ref Range   Color, Urine STRAW (A) YELLOW   APPearance HAZY (A) CLEAR   Specific Gravity, Urine 1.004 (L) 1.005 - 1.030   pH 6.0 5.0 - 8.0   Glucose, UA NEGATIVE NEGATIVE mg/dL   Hgb urine dipstick SMALL (A) NEGATIVE   Bilirubin Urine NEGATIVE NEGATIVE   Ketones, ur NEGATIVE  NEGATIVE mg/dL   Protein, ur NEGATIVE NEGATIVE mg/dL   Nitrite NEGATIVE NEGATIVE   Leukocytes, UA SMALL (A) NEGATIVE   RBC / HPF 0-5 0 - 5 RBC/hpf   WBC, UA 6-10 0 - 5 WBC/hpf   Bacteria, UA RARE (A) NONE SEEN   Squamous Epithelial / LPF 6-10 0 - 5   Crystals PRESENT (A) NEGATIVE    Comment: Performed at [facility], [address], [place], [place] [address]  Urine Culture     Status: None   Collection Time: 09/30/18 12:49 AM  Result Value Ref Range   Specimen Description      URINE, CLEAN CATCH Performed at [facility], [address] [place], [place] [address]    Special Requests      NONE Performed at [facility], [address], [place], [place] [address]    Culture      NO GROWTH Performed at [facility], [place] [address], [place],  [address]    Report Status 10/01/2018 FINAL   Surgical pathology     Status: None   Collection Time: 10/07/18  9:40 AM  Result Value Ref Range   SURGICAL PATHOLOGY      Surgical Pathology CASE: [id number] PATIENT: Shatima Brilliant Surgical Pathology Report     SPECIMEN SUBMITTED: A. Colon polyp x3, ascending; cold snare B. Colon, random, r/o microscopic colitis; cbx C. Colon polyp x3, transverse; cold snare  CLINICAL HISTORY: None provided  PRE-OPERATIVE DIAGNOSIS: diarrhea, unspecified type R19.7  POST-OPERATIVE DIAGNOSIS: Colon polyps, diverticulosis     DIAGNOSIS: A. COLON POLYP X3, ASCENDING; BIOPSY: - TUBULAR ADENOMA (MULTIPLE FRAGMENTS). - NEGATIVE FOR HIGH-GRADE DYSPLASIA AND MALIGNANCY.  B.  COLON, RANDOM; BIOPSIES: - BENIGN COLONIC MUCOSA, NEGATIVE FOR ACTIVE MUCOSAL INFLAMMATION, MICROSCOPIC/LYMPHOCYTIC COLITIS, GRANULOMATA, DYSPLASIA AND MALIGNANCY.  C. COLON POLYP X3, TRANSVERSE; BIOPSY: - TUBULAR ADENOMA (3 FRAGMENTS). - NEGATIVE FOR HIGH-GRADE DYSPLASIA AND MALIGNANCY.  GROSS DESCRIPTION: A. Labeled: Cold snare polyp ascending colon x3 Received: In  formalin Tissue fragment(s): 5 Size: 0.3-0.5 cm  Description: Tan fragments Entirely submitted in 1 cassette.  B. Labeled: Cbx random colon rule out microscopic colitis Received: In formalin Tissue fragment(s): Multiple Size: aggregate, 1.4 x 0.3 x 0.1cm Description: Pink-tan fragments Entirely submitted in 1 cassette.  C. Labeled: Cold snare polyp transverse colon x3 Received: In formalin Tissue fragment(s): Multiple Size: Aggregate, 2.0 x 0.9 x 0.1 cm Description: Tan tissue fragments (30%) and yellow to brown fecal material Entirely submitted in 1 cassette.   Final Diagnosis performed by Raynelle Bring, MD.   Electronically signed 10/08/2018 11:29:26AM The electronic signature indicates that the named Attending Pathologist has evaluated the specimen  Technical component performed at [facility], [address] [place], [place] [address] Lab: [phone] Dir: Rush Farmer, MD, MMM  Professional component performed at [facility], [facility] [place], [place], [place] [address] Lab: [phone] Dir: Dellia Nims. Rubinas, MD       PHQ2/9: Depression screen [facility] 2/9 09/01/2018 07/25/2018 01/22/2018 01/22/2018 12/20/2017  Decreased Interest 0 0 0 0 0  Down, Depressed, Hopeless 0 0 0 0 0  PHQ - 2 Score 0 0 0 0 0  Altered sleeping 0 - - - -  Tired, decreased energy 0 - - - -  Change in appetite 0 - - - -  Feeling bad or failure about yourself  0 - - - -  Trouble concentrating 0 - - - -  Moving slowly or fidgety/restless 0 - - - -  Suicidal thoughts 0 - - - -  PHQ-9 Score 0 - - - -  Difficult doing work/chores Not difficult at all - - - -     Fall Risk: Fall Risk  10/24/2018 09/01/2018 07/25/2018 01/22/2018 12/20/2017  Falls in the past year? 0 No No No No  Number falls in past yr: 0 - - - -  Injury with Fall? 0 - - - -      Assessment & Plan  1. Degenerative disc disease, lumbar  Discussed likely the cause of the pain , if recurrence we will try  prednisone taper   - gabapentin (NEURONTIN) 100 MG capsule; Take 1-3 capsules (100-300 mg total) by mouth at bedtime. Start at 100 mg and go up by one cap every 3 days, max of 300 mg  Dispense: 90 capsule; Refill: 3   2. History of abdominal pain  Likely radiculitis

## 2018-10-27 ENCOUNTER — Encounter: Payer: Self-pay | Admitting: Gastroenterology

## 2018-10-27 ENCOUNTER — Ambulatory Visit: Payer: Medicare Other | Admitting: Gastroenterology

## 2018-10-27 VITALS — BP 128/76 | HR 76 | Ht 65.0 in | Wt 173.4 lb

## 2018-10-27 DIAGNOSIS — R197 Diarrhea, unspecified: Secondary | ICD-10-CM

## 2018-10-27 DIAGNOSIS — Z86010 Personal history of colonic polyps: Secondary | ICD-10-CM | POA: Diagnosis not present

## 2018-10-27 NOTE — Progress Notes (Signed)
[name] MD, MRCP(U.K) [address]  [place]  [place], [place] [address]  Main: [phone]  Fax: [phone]   Primary Care Physician: Steele Sizer, MD  Primary Gastroenterologist:  Dr. [name]   Chief Complaint  Patient presents with  . Follow-up    Diarrhea    HPI: Morgan Livingston is a 72 y.o. female    Summary of history :  She is here today to follow up to her initial visit on 09/08/18 for diarrhea onging for 1 year , 2-3 times a day ,    09/10/18 : PCR stool, C diff ,calprotectin,celiac serology  testing negative.    Interval history   09/08/2018-  10/27/2018  10/2018 : Colonoscopy : Tubular adenomas x6 excised, random colon bx negative for microscopic colitis.   Diarrhea has resolved.    Current Outpatient Medications  Medication Sig Dispense Refill  . aspirin 81 MG tablet Take 81 mg by mouth daily.    . Biotin 5 MG CAPS Take by mouth daily.    . cholecalciferol (VITAMIN D) 1000 UNITS tablet Take by mouth.    . fexofenadine (ALLEGRA) 180 MG tablet Take 180 mg by mouth daily.    . fluticasone (FLONASE) 50 MCG/ACT nasal spray Place 2 sprays into both nostrils daily. 16 g 0  . gabapentin (NEURONTIN) 100 MG capsule Take 1-3 capsules (100-300 mg total) by mouth at bedtime. Start at 100 mg and go up by one cap every 3 days, max of 300 mg 90 capsule 3  . losartan-hydrochlorothiazide (HYZAAR) 100-12.5 MG tablet TAKE 1 TABLET BY MOUTH EVERY DAY 90 tablet 0  . rosuvastatin (CRESTOR) 20 MG tablet TAKE 1 TABLET BY MOUTH EVERY DAY 90 tablet 1  . traMADol (ULTRAM) 50 MG tablet Take 1 tablet (50 mg total) by mouth every 6 (six) hours as needed. 6 tablet 0   No current facility-administered medications for this visit.     Allergies as of 10/27/2018  . (No Known Allergies)    ROS:  General: Negative for anorexia, weight loss, fever, chills, fatigue, weakness. ENT: Negative for hoarseness, difficulty swallowing , nasal congestion. CV: Negative for chest  pain, angina, palpitations, dyspnea on exertion, peripheral edema.  Respiratory: Negative for dyspnea at rest, dyspnea on exertion, cough, sputum, wheezing.  GI: See history of present illness. GU:  Negative for dysuria, hematuria, urinary incontinence, urinary frequency, nocturnal urination.  Endo: Negative for unusual weight change.    Physical Examination:   BP 128/76   Pulse 76   Ht 5\' 5"  (1.651 m)   Wt 173 lb 6.4 oz (78.7 kg)   BMI 28.86 kg/m   General: Well-nourished, well-developed in no acute distress.  Eyes: No icterus. Conjunctivae pink. Mouth: Oropharyngeal mucosa moist and pink , no lesions erythema or exudate. Lungs: Clear to auscultation bilaterally. Non-labored. Heart: Regular rate and rhythm, no murmurs rubs or gallops.  Abdomen: Bowel sounds are normal, nontender, nondistended, no hepatosplenomegaly or masses, no abdominal bruits or hernia , no rebound or guarding.   Extremities: No lower extremity edema. No clubbing or deformities. Neuro: Alert and oriented x 3.  Grossly intact. Skin: Warm and dry, no jaundice.   Psych: Alert and cooperative, normal mood and affect.   Imaging Studies: Ct Abdomen Pelvis W Contrast  Result Date: 09/30/2018 CLINICAL DATA:  Abdominal pain EXAM: CT ABDOMEN AND PELVIS WITH CONTRAST TECHNIQUE: Multidetector CT imaging of the abdomen and pelvis was performed using the standard protocol following bolus administration of intravenous contrast. CONTRAST:  18mL ISOVUE-300 IOPAMIDOL (ISOVUE-300) INJECTION 61% COMPARISON:  CT abdomen pelvis 04/09/2015 FINDINGS: LOWER CHEST: There is no basilar pleural or apical pericardial effusion. HEPATOBILIARY: The hepatic contours and density are normal. There is no intra- or extrahepatic biliary dilatation. The gallbladder is normal. PANCREAS: The pancreatic parenchymal contours are normal and there is no ductal dilatation. There is no peripancreatic fluid collection. SPLEEN: Normal. ADRENALS/URINARY TRACT:  --Adrenal glands: Normal. --Right kidney/ureter: No hydronephrosis, nephroureterolithiasis, perinephric stranding or solid renal mass. --Left kidney/ureter: No hydronephrosis, nephroureterolithiasis, perinephric stranding or solid renal mass. --Urinary bladder: Normal for degree of distention STOMACH/BOWEL: --Stomach/Duodenum: There is no hiatal hernia or other gastric abnormality. The duodenal course and caliber are normal. --Small bowel: No dilatation or inflammation. --Colon: Rectosigmoid diverticulosis without acute inflammation. --Appendix: Not visualized. No right lower quadrant inflammation or free fluid. VASCULAR/LYMPHATIC: Atherosclerotic calcification is present within the non-aneurysmal abdominal aorta, without hemodynamically significant stenosis. The portal vein, splenic vein, superior mesenteric vein and IVC are patent. No abdominal or pelvic lymphadenopathy. REPRODUCTIVE: Status post hysterectomy. No adnexal mass. MUSCULOSKELETAL. No bony spinal canal stenosis or focal osseous abnormality. OTHER: None. IMPRESSION: 1. No acute abdominal or pelvic abnormality. 2. Sigmoid diverticulosis without acute inflammation. Aortic atherosclerosis (ICD10-I70.0). Electronically Signed   By: Ulyses Jarred M.D.   On: 09/30/2018 04:08    Assessment and Plan:   Morgan Livingston is a 72 y.o. y/o female here to follow up for diarrhea. Resolved since last visit   Plan  1.Colonoscopy in 3 years for polyp surveillance.     Dr [name]  MD,MRCP [facility]) Follow up PRN

## 2018-11-15 ENCOUNTER — Other Ambulatory Visit: Payer: Self-pay | Admitting: Family Medicine

## 2018-11-15 DIAGNOSIS — I10 Essential (primary) hypertension: Secondary | ICD-10-CM

## 2018-11-17 ENCOUNTER — Inpatient Hospital Stay: Payer: Medicare Other | Attending: Oncology

## 2018-11-17 DIAGNOSIS — E538 Deficiency of other specified B group vitamins: Secondary | ICD-10-CM

## 2018-11-17 DIAGNOSIS — N189 Chronic kidney disease, unspecified: Secondary | ICD-10-CM | POA: Diagnosis not present

## 2018-11-17 DIAGNOSIS — D631 Anemia in chronic kidney disease: Secondary | ICD-10-CM | POA: Diagnosis not present

## 2018-11-17 MED ORDER — CYANOCOBALAMIN 1000 MCG/ML IJ SOLN
1000.0000 ug | Freq: Once | INTRAMUSCULAR | Status: AC
  Administered 2018-11-17: 1000 ug via INTRAMUSCULAR

## 2018-12-09 ENCOUNTER — Ambulatory Visit
Admission: RE | Admit: 2018-12-09 | Discharge: 2018-12-09 | Disposition: A | Discharge status: Home / Self Care | Payer: Medicare Other | Source: Ambulatory Visit | Attending: Family Medicine | Admitting: Family Medicine

## 2018-12-09 DIAGNOSIS — N6320 Unspecified lump in the left breast, unspecified quadrant: Secondary | ICD-10-CM

## 2018-12-09 DIAGNOSIS — N6012 Diffuse cystic mastopathy of left breast: Secondary | ICD-10-CM

## 2018-12-09 DIAGNOSIS — R928 Other abnormal and inconclusive findings on diagnostic imaging of breast: Secondary | ICD-10-CM | POA: Diagnosis present

## 2018-12-22 ENCOUNTER — Inpatient Hospital Stay (HOSPITAL_BASED_OUTPATIENT_CLINIC_OR_DEPARTMENT_OTHER): Payer: Medicare Other | Admitting: Oncology

## 2018-12-22 ENCOUNTER — Encounter: Payer: Self-pay | Admitting: Oncology

## 2018-12-22 ENCOUNTER — Inpatient Hospital Stay: Payer: Medicare Other | Attending: Oncology

## 2018-12-22 ENCOUNTER — Other Ambulatory Visit: Payer: Self-pay

## 2018-12-22 ENCOUNTER — Inpatient Hospital Stay: Payer: Medicare Other

## 2018-12-22 VITALS — BP 144/89 | HR 57 | Temp 96.1°F | Resp 18 | Wt 172.6 lb

## 2018-12-22 DIAGNOSIS — I129 Hypertensive chronic kidney disease with stage 1 through stage 4 chronic kidney disease, or unspecified chronic kidney disease: Secondary | ICD-10-CM | POA: Diagnosis not present

## 2018-12-22 DIAGNOSIS — E538 Deficiency of other specified B group vitamins: Secondary | ICD-10-CM

## 2018-12-22 DIAGNOSIS — Z87891 Personal history of nicotine dependence: Secondary | ICD-10-CM | POA: Diagnosis not present

## 2018-12-22 DIAGNOSIS — N189 Chronic kidney disease, unspecified: Secondary | ICD-10-CM | POA: Diagnosis not present

## 2018-12-22 DIAGNOSIS — D696 Thrombocytopenia, unspecified: Secondary | ICD-10-CM

## 2018-12-22 DIAGNOSIS — Z79899 Other long term (current) drug therapy: Secondary | ICD-10-CM | POA: Insufficient documentation

## 2018-12-22 LAB — COMPREHENSIVE METABOLIC PANEL
ALT: 19 U/L (ref 0–44)
AST: 32 U/L (ref 15–41)
Albumin: 3.9 g/dL (ref 3.5–5.0)
Alkaline Phosphatase: 62 U/L (ref 38–126)
Anion gap: 7 (ref 5–15)
BUN: 20 mg/dL (ref 8–23)
CO2: 27 mmol/L (ref 22–32)
Calcium: 9.5 mg/dL (ref 8.9–10.3)
Chloride: 105 mmol/L (ref 98–111)
Creatinine, Ser: 1.38 mg/dL — ABNORMAL HIGH (ref 0.44–1.00)
GFR calc Af Amer: 44 mL/min — ABNORMAL LOW (ref >60)
GFR calc non Af Amer: 38 mL/min — ABNORMAL LOW (ref >60)
Glucose, Bld: 112 mg/dL — ABNORMAL HIGH (ref 70–99)
Potassium: 3.9 mmol/L (ref 3.5–5.1)
Sodium: 139 mmol/L (ref 135–145)
TOTAL PROTEIN: 7.6 g/dL (ref 6.5–8.1)
Total Bilirubin: 0.5 mg/dL (ref 0.3–1.2)

## 2018-12-22 LAB — CBC WITH DIFFERENTIAL/PLATELET
Abs Immature Granulocytes: 0.02 10*3/uL (ref 0.00–0.07)
BASOS PCT: 0 %
Basophils Absolute: 0 10*3/uL (ref 0.0–0.1)
EOS ABS: 0.1 10*3/uL (ref 0.0–0.5)
EOS PCT: 1 %
HCT: 36 % (ref 36.0–46.0)
Hemoglobin: 12 g/dL (ref 12.0–15.0)
Immature Granulocytes: 0 %
Lymphocytes Relative: 42 %
Lymphs Abs: 3 10*3/uL (ref 0.7–4.0)
MCH: 31.1 pg (ref 26.0–34.0)
MCHC: 33.3 g/dL (ref 30.0–36.0)
MCV: 93.3 fL (ref 80.0–100.0)
Monocytes Absolute: 0.7 10*3/uL (ref 0.1–1.0)
Monocytes Relative: 10 %
Neutro Abs: 3.3 10*3/uL (ref 1.7–7.7)
Neutrophils Relative %: 47 %
Platelets: 131 10*3/uL — ABNORMAL LOW (ref 150–400)
RBC: 3.86 MIL/uL — ABNORMAL LOW (ref 3.87–5.11)
RDW: 13.5 % (ref 11.5–15.5)
WBC: 7 10*3/uL (ref 4.0–10.5)
nRBC: 0 % (ref 0.0–0.2)

## 2018-12-22 LAB — VITAMIN B12: Vitamin B-12: 366 pg/mL (ref 180–914)

## 2018-12-22 MED ORDER — CYANOCOBALAMIN 1000 MCG/ML IJ SOLN
1000.0000 ug | Freq: Once | INTRAMUSCULAR | Status: AC
  Administered 2018-12-22: 1000 ug via INTRAMUSCULAR
  Filled 2018-12-22: qty 1

## 2018-12-22 NOTE — Progress Notes (Signed)
Hematology/Oncology Consult note [facility] Telephone:([phone] [phone]   Patient Care Team: Steele Sizer, MD as PCP - General (Family Medicine)  REFERRING PROVIDER: Dr.Lateef REASON FOR VISIT Follow up for management of abnormal UPEP  HISTORY OF PRESENTING ILLNESS:  Morgan Livingston is a  73 y.o.  female with PMH listed below who was referred to me for evaluation of abnormal UPEP.   She has CKD and follows up with Dr.Lateef. History of HTN. Reports doing well, denies bone pain.  Fatigue:  Chronic onset, perisistent, no aggravating or improving factors, no associated symptoms.  Reviewed labs which was done at [facility] office.  03/05/2018 SPEP done at [facility] [place] showed negative for M Spike UPEP: M spike 16.4  INTERVAL HISTORY Morgan Livingston is a 73 y.o. female who has above history reviewed by me today presents for follow up visit for management of thrombocytopenia and vitamin B12 deficiency, 4 months assessment. Patient reports doing well.  She has no new complaints.  Chronic fatigue has improved.  She has been getting vitamin B12 injections monthly. Weight is stable. During the interval patient has had colonoscopy done on 12 02/05/2016 with findings of polyps which were resected and retrieved.  Pathology showed for high-grade dysplasia and malignancy.  Tubular adenoma   Review of Systems  Constitutional: Positive for malaise/fatigue. Negative for chills, fever and weight loss.  HENT: Negative for sore throat.   Eyes: Negative for redness.  Respiratory: Negative for cough, shortness of breath and wheezing.   Cardiovascular: Negative for chest pain, palpitations and leg swelling.  Gastrointestinal: Negative for abdominal pain, blood in stool, nausea and vomiting.  Genitourinary: Negative for dysuria.  Musculoskeletal: Negative for myalgias.  Skin: Negative for rash.  Neurological: Negative for dizziness, tingling and  tremors.  Endo/Heme/Allergies: Does not bruise/bleed easily.  Psychiatric/Behavioral: Negative for hallucinations.    MEDICAL HISTORY:  Past Medical History:  Diagnosis Date  . Cancer [facility])    Uterine Cancer  . Hyperlipidemia   . Hypertension   . Kidney stones 06/21/2018    SURGICAL HISTORY: Past Surgical History:  Procedure Laterality Date  . ABDOMINAL HYSTERECTOMY    . BREAST BIOPSY Right 04/09/2006   negative  . BREAST BIOPSY Left 12/04/2017   fibroadenmatoid change with coarse calcs  . COLONOSCOPY WITH PROPOFOL N/A 10/07/2018   Procedure: COLONOSCOPY WITH PROPOFOL;  Surgeon: [name], MD;  Location: [facility] ENDOSCOPY;  Service: Gastroenterology;  Laterality: N/A;    SOCIAL HISTORY: Social History   Socioeconomic History  . Marital status: Married    Spouse name: Paramedic  . Number of children: 2  . Years of education: some college  . Highest education level: 12th grade  Occupational History  . 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 Financial resource strain: Not hard at all  . Food insecurity:    Worry: Never true    Inability: Never true  . Transportation needs:    Medical: No    Non-medical: No  Tobacco Use  . Smoking status: Former Smoker    Packs/day: 1.00    Years: 2.00    Pack years: 2.00    Types: Cigarettes    Last attempt to quit: 1963    Years since quitting: 57.1  . Smokeless tobacco: Never Used  . Tobacco comment: smoking cessation materials not required  Substance and Sexual Activity  . Alcohol use: No    Alcohol/week: 0.0 standard drinks  . Drug  use: No  . Sexual activity: Yes    Partners: Male    Birth control/protection: Post-menopausal  Lifestyle  . Physical activity:    Days per week: 7 days    Minutes per session: 30 min  . Stress: Not at all  Relationships  . Social connections:    Talks on phone: More than three times a week    Gets together: More than three times a week    Attends religious  service: More than 4 times per year    Active member of club or organization: Yes    Attends meetings of clubs or organizations: More than 4 times per year    Relationship status: Married  . Intimate partner violence:    Fear of current or ex partner: No    Emotionally abused: No    Physically abused: No    Forced sexual activity: No  Other Topics Concern  . Not on file  Social History Narrative   Married for 45 years     FAMILY HISTORY: Family History  Problem Relation Age of Onset  . Congestive Heart Failure Mother 32  . Heart attack Father 56  . Sinusitis Sister   . Stomach cancer Sister   . Cancer Sister   . Dementia Sister        26  . Breast cancer Neg Hx     ALLERGIES:  has No Known Allergies.  MEDICATIONS:  Current Outpatient Medications  Medication Sig Dispense Refill  . aspirin 81 MG tablet Take 81 mg by mouth daily.    . Biotin 5 MG CAPS Take by mouth daily.    . cholecalciferol (VITAMIN D) 1000 UNITS tablet Take by mouth.    . fexofenadine (ALLEGRA) 180 MG tablet Take 180 mg by mouth daily.    . fluticasone (FLONASE) 50 MCG/ACT nasal spray Place 2 sprays into both nostrils daily. 16 g 0  . gabapentin (NEURONTIN) 100 MG capsule Take 1-3 capsules (100-300 mg total) by mouth at bedtime. Start at 100 mg and go up by one cap every 3 days, max of 300 mg 90 capsule 3  . losartan-hydrochlorothiazide (HYZAAR) 100-12.5 MG tablet TAKE 1 TABLET BY MOUTH EVERY DAY 90 tablet 0  . rosuvastatin (CRESTOR) 20 MG tablet TAKE 1 TABLET BY MOUTH EVERY DAY 90 tablet 1  . traMADol (ULTRAM) 50 MG tablet Take 1 tablet (50 mg total) by mouth every 6 (six) hours as needed. 6 tablet 0   No current facility-administered medications for this visit.      PHYSICAL EXAMINATION: ECOG PERFORMANCE STATUS: 0 - Asymptomatic Vitals:   12/22/18 1022  BP: (!) 144/89  Pulse: (!) 57  Resp: 18  Temp: (!) 96.1 F (35.6 C)   Filed Weights   12/22/18 1022  Weight: 172 lb 9.6 oz (78.3 kg)     Physical Exam Constitutional:      General: She is not in acute distress. HENT:     Head: Normocephalic and atraumatic.  Eyes:     General: No scleral icterus.    Conjunctiva/sclera: Conjunctivae normal.     Pupils: Pupils are equal, round, and reactive to light.  Neck:     Musculoskeletal: Normal range of motion and neck supple.  Cardiovascular:     Rate and Rhythm: Normal rate and regular rhythm.     Heart sounds: Normal heart sounds.  Pulmonary:     Effort: Pulmonary effort is normal. No respiratory distress.     Breath sounds: Normal breath sounds. No  wheezing or rales.  Chest:     Chest wall: No tenderness.  Abdominal:     General: Bowel sounds are normal. There is no distension.     Palpations: Abdomen is soft. There is no mass.     Tenderness: There is no abdominal tenderness.  Musculoskeletal: Normal range of motion.        General: No deformity.  Lymphadenopathy:     Cervical: No cervical adenopathy.  Skin:    General: Skin is warm and dry.     Findings: No erythema or rash.  Neurological:     Mental Status: She is alert and oriented to person, place, and time.     Cranial Nerves: No cranial nerve deficit.     Coordination: Coordination normal.  Psychiatric:        Behavior: Behavior normal.        Thought Content: Thought content normal.      LABORATORY DATA:  I have reviewed the data as listed Lab Results  Component Value Date   WBC 7.0 12/22/2018   HGB 12.0 12/22/2018   HCT 36.0 12/22/2018   MCV 93.3 12/22/2018   PLT 131 (L) 12/22/2018   Recent Labs    04/16/18 1038 09/30/18 0049 12/22/18 1003  NA 141 141 139  K 4.1 4.1 3.9  CL 105 104 105  CO2 29 28 27   GLUCOSE 129* 115* 112*  BUN 17 18 20   CREATININE 1.28* 1.18* 1.38*  CALCIUM 9.8 9.3 9.5  GFRNONAA 41* 46* 38*  GFRAA 48* 53* 44*  PROT 7.7 7.6 7.6  ALBUMIN 3.8 3.9 3.9  AST 27 27 32  ALT 19 18 19   ALKPHOS 63 57 62  BILITOT 0.6 0.6 0.5   Iron/TIBC/Ferritin/ %Sat    Component  Value Date/Time   IRON 76 05/23/2015 0914   TIBC 276 05/23/2015 0914   FERRITIN 57 05/23/2015 0914   IRONPCTSAT 28 05/23/2015 0914    04/17/2018  SPEP no M spike. Normal serum free light chain ratio. UPEP, no M spike, elevated free kappa/lamda ratio at 20.75.   08/12/2018 UPEP no M spike  ASSESSMENT & PLAN:  1. B12 deficiency   2. Thrombocytopenia (Center)   Labs reviewed and discussed with patient. Hemoglobin remained stable.  Chronic thrombocytopenia platelet counts 1 31,000, slightly decreased from last visit. Previous work-up showed negative hepatitis, HIV. History of vitamin B12, patient has been on monthly B12 injections.  We will proceed with B12 1000 MCG IM x1 today. B12 level came back at 366. We will arrange patient to continue half vitamin B12 monthly. Chronic thrombocytopenia, continue to monitor. # Mild thrombocytopenia, will check smear, folate, vitamin B12. Hepatitis, HIV.  Continue to monitor.   Return to clinic for MD assessment in 3 months with repeat CBC, CMP, vitamin B12.  We will also check intrinsic antibody and antiparietal antibodies.  Orders Placed This Encounter  Procedures  . CBC with Differential/Platelet    Standing Status:   Future    Standing Expiration Date:   12/23/2019  . Comprehensive metabolic panel    Standing Status:   Future    Standing Expiration Date:   12/23/2019  . Vitamin B12    Standing Status:   Future    Standing Expiration Date:   04/22/2019  . Intrinsic Factor Antibodies    Standing Status:   Future    Standing Expiration Date:   12/23/2019  . Anti-parietal antibody    Standing Status:   Future    Standing Expiration  Date:   12/23/2019    All questions were answered. The patient knows to call the clinic with any problems questions or concerns.   Earlie Server, MD, PhD Hematology Oncology [facility] at [facility] Pager- [phone] 12/22/2018

## 2018-12-22 NOTE — Progress Notes (Signed)
Patient here for follow up. No changes since she was here last time.

## 2018-12-23 ENCOUNTER — Ambulatory Visit (INDEPENDENT_AMBULATORY_CARE_PROVIDER_SITE_OTHER): Payer: Medicare Other

## 2018-12-23 VITALS — BP 132/82 | HR 60 | Temp 97.5°F | Resp 16 | Ht 65.0 in | Wt 173.9 lb

## 2018-12-23 DIAGNOSIS — Z0000 Encounter for general adult medical examination without abnormal findings: Secondary | ICD-10-CM

## 2018-12-23 DIAGNOSIS — Z780 Asymptomatic menopausal state: Secondary | ICD-10-CM | POA: Diagnosis not present

## 2018-12-23 NOTE — Progress Notes (Signed)
Subjective:   Morgan Livingston is a 73 y.o. female who presents for Medicare Annual (Subsequent) preventive examination.  Review of Systems:   Cardiac Risk Factors include: advanced age (>37men, >24 women);dyslipidemia;hypertension     Objective:     Vitals: BP 132/82 (BP Location: Right Arm, Patient Position: Sitting, Cuff Size: Normal)   Pulse 60   Temp (!) 97.5 F (36.4 C) (Oral)   Resp 16   Ht 5\' 5"  (1.651 m)   Wt 173 lb 14.4 oz (78.9 kg)   SpO2 99%   BMI 28.94 kg/m   Body mass index is 28.94 kg/m.  Advanced Directives 12/23/2018 12/22/2018 10/07/2018 09/30/2018 08/15/2018 05/09/2018 04/16/2018  Does Patient Have a Medical Advance Directive? No No No No No No No  Would patient like information on creating a medical advance directive? No - Patient declined No - Patient declined - - - No - Patient declined -    Tobacco Social History   Tobacco Use  Smoking Status Former Smoker  . Packs/day: 1.00  . Years: 2.00  . Pack years: 2.00  . Types: Cigarettes  . Last attempt to quit: 1963  . Years since quitting: 57.1  Smokeless Tobacco Never Used  Tobacco Comment   smoking cessation materials not required     Counseling given: Not Answered Comment: smoking cessation materials not required   Clinical Intake:  Pre-visit preparation completed: Yes  Pain : No/denies pain Pain Score: 0-No pain     BMI - recorded: 28.94 Nutritional Status: BMI 25 -29 Overweight Nutritional Risks: None Diabetes: No  How often do you need to have someone help you when you read instructions, pamphlets, or other written materials from your doctor or pharmacy?: 1 - Never What is the last grade level you completed in school?: some college  Interpreter Needed?: No  Information entered by :: Clemetine Marker LPN  Past Medical History:  Diagnosis Date  . Allergy   . Cancer [facility])    Uterine Cancer  . Hyperlipidemia   . Hypertension   . Kidney stones 06/21/2018   Past Surgical History:    Procedure Laterality Date  . ABDOMINAL HYSTERECTOMY    . BREAST BIOPSY Right 04/09/2006   negative  . BREAST BIOPSY Left 12/04/2017   fibroadenmatoid change with coarse calcs  . COLONOSCOPY WITH PROPOFOL N/A 10/07/2018   Procedure: COLONOSCOPY WITH PROPOFOL;  Surgeon: [name], MD;  Location: [facility] ENDOSCOPY;  Service: Gastroenterology;  Laterality: N/A;   Family History  Problem Relation Age of Onset  . Congestive Heart Failure Mother 52  . Heart attack Father 68  . Sinusitis Sister   . Stomach cancer Sister   . Uterine cancer Sister   . Liver cancer Sister   . Pancreatic cancer Sister   . Dementia Sister        18  . Breast cancer Neg Hx    Social History   Socioeconomic History  . Marital status: Married    Spouse name: Paramedic  . Number of children: 2  . Years of education: some college  . Highest education level: 12th grade  Occupational History  . 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 Financial resource strain: Not hard at all  . Food insecurity:    Worry: Never true    Inability: Never true  . Transportation needs:    Medical: No    Non-medical: No  Tobacco Use  . Smoking status: Former Smoker  Packs/day: 1.00    Years: 2.00    Pack years: 2.00    Types: Cigarettes    Last attempt to quit: 1963    Years since quitting: 57.1  . Smokeless tobacco: Never Used  . Tobacco comment: smoking cessation materials not required  Substance and Sexual Activity  . Alcohol use: No    Alcohol/week: 0.0 standard drinks  . Drug use: No  . Sexual activity: Yes    Partners: Male    Birth control/protection: Post-menopausal  Lifestyle  . Physical activity:    Days per week: 0 days    Minutes per session: 0 min  . Stress: Not at all  Relationships  . Social connections:    Talks on phone: More than three times a week    Gets together: More than three times a week    Attends religious service: More than 4 times per year    Active  member of club or organization: Yes    Attends meetings of clubs or organizations: More than 4 times per year    Relationship status: Married  Other Topics Concern  . Not on file  Social History Narrative   Married for 50 years     Outpatient Encounter Medications as of 12/23/2018  Medication Sig  . aspirin 81 MG tablet Take 81 mg by mouth daily.  . Biotin 5 MG CAPS Take by mouth daily.  . cholecalciferol (VITAMIN D) 1000 UNITS tablet Take by mouth.  . fexofenadine (ALLEGRA) 180 MG tablet Take 180 mg by mouth daily.  . fluticasone (FLONASE) 50 MCG/ACT nasal spray Place 2 sprays into both nostrils daily.  Marland Kitchen losartan-hydrochlorothiazide (HYZAAR) 100-12.5 MG tablet TAKE 1 TABLET BY MOUTH EVERY DAY  . rosuvastatin (CRESTOR) 20 MG tablet TAKE 1 TABLET BY MOUTH EVERY DAY  . traMADol (ULTRAM) 50 MG tablet Take 1 tablet (50 mg total) by mouth every 6 (six) hours as needed.  . [DISCONTINUED] gabapentin (NEURONTIN) 100 MG capsule Take 1-3 capsules (100-300 mg total) by mouth at bedtime. Start at 100 mg and go up by one cap every 3 days, max of 300 mg (Patient not taking: Reported on 12/23/2018)   No facility-administered encounter medications on file as of 12/23/2018.     Activities of Daily Living In your present state of health, do you have any difficulty performing the following activities: 12/23/2018 07/25/2018  Hearing? N N  Comment declines hearing aids -  Vision? N N  Comment wears glasses -  Difficulty concentrating or making decisions? N N  Walking or climbing stairs? N N  Dressing or bathing? N N  Doing errands, shopping? N N  Preparing Food and eating ? N -  Using the Toilet? N -  In the past six months, have you accidently leaked urine? N -  Do you have problems with loss of bowel control? N -  Managing your Medications? N -  Managing your Finances? N -  Housekeeping or managing your Housekeeping? N -  Some recent data might be hidden    Patient Care Team: Steele Sizer,  MD as PCP - General (Family Medicine)    Assessment:   This is a routine wellness examination for Earling.  Exercise Activities and Dietary recommendations Current Exercise Habits: The patient does not participate in regular exercise at present, Exercise limited by: None identified  Goals    . DIET - INCREASE WATER INTAKE     Recommend to drink at least 6-8 8oz glasses of water per day.    Marland Kitchen  Weight (lb) < 165 lb (74.8 kg)     Pt states she would like to lose 8 lbs over the next few months       Fall Risk Fall Risk  12/23/2018 10/24/2018 09/01/2018 07/25/2018 01/22/2018  Falls in the past year? 0 0 No No No  Number falls in past yr: 0 0 - - -  Injury with Fall? 0 0 - - -  Follow up Falls prevention discussed - - - -   FALL RISK PREVENTION PERTAINING TO THE HOME:  Any stairs in or around the home? No  If so, do they handrails? No   Home free of loose throw rugs in walkways, pet beds, electrical cords, etc? Yes  Adequate lighting in your home to reduce risk of falls? Yes   ASSISTIVE DEVICES UTILIZED TO PREVENT FALLS:  Life alert? No  Use of a cane, walker or w/c? No  Grab bars in the bathroom? No  Shower chair or bench in shower? No  Elevated toilet seat or a handicapped toilet? No   DME ORDERS:  DME order needed?  No   TIMED UP AND GO:  Was the test performed? Yes .  Length of time to ambulate 10 feet: 5 sec.   GAIT:  Appearance of gait: Gait stead-fast and without the use of an assistive device.    Education: Fall risk prevention has been discussed.  Intervention(s) required? No   Depression Screen PHQ 2/9 Scores 12/23/2018 09/01/2018 07/25/2018 01/22/2018  PHQ - 2 Score 0 0 0 0  PHQ- 9 Score - 0 - -     Cognitive Function     6CIT Screen 12/23/2018 12/20/2017  What Year? 0 points 0 points  What month? 0 points 0 points  What time? 0 points 0 points  Count back from 20 0 points 0 points  Months in reverse 0 points 0 points  Repeat phrase 0 points 0 points    Total Score 0 0    Immunization History  Administered Date(s) Administered  . Influenza, High Dose Seasonal PF 09/23/2015, 08/22/2016, 08/14/2017, 07/25/2018  . Influenza-Unspecified 09/20/2014  . Pneumococcal Conjugate-13 01/12/2014  . Pneumococcal Polysaccharide-23 12/28/2014  . Tdap 05/14/2017  . Tetanus 02/25/2007  . Zoster 08/11/2012    Qualifies for Shingles Vaccine? Yes  Zostavax completed 2013. Due for Shingrix. Education has been provided regarding the importance of this vaccine. Pt has been advised to call insurance company to determine out of pocket expense. Advised may also receive vaccine at local pharmacy or Health Dept. Verbalized acceptance and understanding.  Tdap: Up to date  Flu Vaccine: Up to date  Pneumococcal Vaccine: Up to date   Screening Tests Health Maintenance  Topic Date Due  . MAMMOGRAM  12/10/2019  . COLONOSCOPY  10/07/2021  . TETANUS/TDAP  05/15/2027  . INFLUENZA VACCINE  Completed  . DEXA SCAN  Completed  . Hepatitis C Screening  Completed  . PNA vac Low Risk Adult  Completed    Cancer Screenings:  Colorectal Screening: Completed 10/07/18. Repeat every 3 years.  Mammogram: Completed 12/09/18. Repeat every year.  Bone Density: Completed 07/11/10. Results reflect NORMAL.  Repeat every 2 years. Ordered today. Pt provided with contact information and advised to call to schedule appt.   Lung Cancer Screening: (Low Dose CT Chest recommended if Age 79-80 years, 30 pack-year currently smoking OR have quit w/in 15years.) does not qualify.    Additional Screening:  Hepatitis C Screening: does qualify; Completed 08/15/18  Vision Screening: Recommended annual  ophthalmology exams for early detection of glaucoma and other disorders of the eye. Is the patient up to date with their annual eye exam?  Yes  Who is the provider or what is the name of the office in which the pt attends annual eye exams? [place] Screening:  Recommended annual dental exams for proper oral hygiene  Community Resource Referral:  CRR required this visit?  No      Plan:      I have personally reviewed and addressed the Medicare Annual Wellness questionnaire and have noted the following in the patient's chart:  A. Medical and social history B. Use of alcohol, tobacco or illicit drugs  C. Current medications and supplements D. Functional ability and status E.  Nutritional status F.  Physical activity G. Advance directives H. List of other physicians I.  Hospitalizations, surgeries, and ER visits in previous 12 months J.  [place] such as hearing and vision if needed, cognitive and depression L. Referrals and appointments   In addition, I have reviewed and discussed with patient certain preventive protocols, quality metrics, and best practice recommendations. A written personalized care plan for preventive services as well as general preventive health recommendations were provided to patient.   Signed,  Clemetine Marker, LPN Nurse Health Advisor   Nurse Notes: pt doing well and appreciative of visit today

## 2018-12-23 NOTE — Patient Instructions (Signed)
Morgan Livingston , Thank you for taking time to come for your Medicare Wellness Visit. I appreciate your ongoing commitment to your health goals. Please review the following plan we discussed and let me know if I can assist you in the future.   Screening recommendations/referrals: Colonoscopy: done 10/07/18. Repeat in 3 years.  Mammogram: done 12/09/18. Repeat every year.  Bone Density: done 07/11/2010. Please call [phone] to schedule your bone density.  Recommended yearly ophthalmology/optometry visit for glaucoma screening and checkup Recommended yearly dental visit for hygiene and checkup  Vaccinations: Influenza vaccine: done 07/25/18 Pneumococcal vaccine: done 12/28/14 Tdap vaccine: done 05/14/17 Shingles vaccine: Shingrix discussed. Please contact your pharmacy for coverage information.   Advanced directives: Advance directive discussed with you today. Even though you declined this today please call our office should you change your mind and we can give you the proper paperwork for you to fill out.  Conditions/risks identified: Recommend healthy eating and physical activity for desired weight loss.   Next appointment: Please follow up in one year for your Medicare Annual Wellness visit.    Preventive Care 65 Years and Older, Female Preventive care refers to lifestyle choices and visits with your health care provider that can promote health and wellness. What does preventive care include?  A yearly physical exam. This is also called an annual well check.  Dental exams once or twice a year.  Routine eye exams. Ask your health care provider how often you should have your eyes checked.  Personal lifestyle choices, including:  Daily care of your teeth and gums.  Regular physical activity.  Eating a healthy diet.  Avoiding tobacco and drug use.  Limiting alcohol use.  Practicing safe sex.  Taking low-dose aspirin every day.  Taking vitamin and mineral supplements as  recommended by your health care provider. What happens during an annual well check? The services and screenings done by your health care provider during your annual well check will depend on your age, overall health, lifestyle risk factors, and family history of disease. Counseling  Your health care provider may ask you questions about your:  Alcohol use.  Tobacco use.  Drug use.  Emotional well-being.  Home and relationship well-being.  Sexual activity.  Eating habits.  History of falls.  Memory and ability to understand (cognition).  Work and work Statistician.  Reproductive health. Screening  You may have the following tests or measurements:  Height, weight, and BMI.  Blood pressure.  Lipid and cholesterol levels. These may be checked every 5 years, or more frequently if you are over 72 years old.  Skin check.  Lung cancer screening. You may have this screening every year starting at age 73 if you have a 30-pack-year history of smoking and currently smoke or have quit within the past 15 years.  Fecal occult blood test (FOBT) of the stool. You may have this test every year starting at age 63.  Flexible sigmoidoscopy or colonoscopy. You may have a sigmoidoscopy every 5 years or a colonoscopy every 10 years starting at age 41.  Hepatitis C blood test.  Hepatitis B blood test.  Sexually transmitted disease (STD) testing.  Diabetes screening. This is done by checking your blood sugar (glucose) after you have not eaten for a while (fasting). You may have this done every 1-3 years.  Bone density scan. This is done to screen for osteoporosis. You may have this done starting at age 90.  Mammogram. This may be done every 1-2 years. Talk to your  health care provider about how often you should have regular mammograms. Talk with your health care provider about your test results, treatment options, and if necessary, the need for more tests. Vaccines  Your health care  provider may recommend certain vaccines, such as:  Influenza vaccine. This is recommended every year.  Tetanus, diphtheria, and acellular pertussis (Tdap, Td) vaccine. You may need a Td booster every 10 years.  Zoster vaccine. You may need this after age 4.  Pneumococcal 13-valent conjugate (PCV13) vaccine. One dose is recommended after age 53.  Pneumococcal polysaccharide (PPSV23) vaccine. One dose is recommended after age 41. Talk to your health care provider about which screenings and vaccines you need and how often you need them. This information is not intended to replace advice given to you by your health care provider. Make sure you discuss any questions you have with your health care provider. Document Released: 11/18/2015 Document Revised: 07/11/2016 Document Reviewed: 08/23/2015 Elsevier Interactive Patient Education  2017 [place] Prevention in the Home Falls can cause injuries. They can happen to people of all ages. There are many things you can do to make your home safe and to help prevent falls. What can I do on the outside of my home?  Regularly fix the edges of walkways and driveways and fix any cracks.  Remove anything that might make you trip as you walk through a door, such as a raised step or threshold.  Trim any bushes or trees on the path to your home.  Use bright outdoor lighting.  Clear any walking paths of anything that might make someone trip, such as rocks or tools.  Regularly check to see if handrails are loose or broken. Make sure that both sides of any steps have handrails.  Any raised decks and porches should have guardrails on the edges.  Have any leaves, snow, or ice cleared regularly.  Use sand or salt on walking paths during winter.  Clean up any spills in your garage right away. This includes oil or grease spills. What can I do in the bathroom?  Use night lights.  Install grab bars by the toilet and in the tub and shower. Do  not use towel bars as grab bars.  Use non-skid mats or decals in the tub or shower.  If you need to sit down in the shower, use a plastic, non-slip stool.  Keep the floor dry. Clean up any water that spills on the floor as soon as it happens.  Remove soap buildup in the tub or shower regularly.  Attach bath mats securely with double-sided non-slip rug tape.  Do not have throw rugs and other things on the floor that can make you trip. What can I do in the bedroom?  Use night lights.  Make sure that you have a light by your bed that is easy to reach.  Do not use any sheets or blankets that are too big for your bed. They should not hang down onto the floor.  Have a firm chair that has side arms. You can use this for support while you get dressed.  Do not have throw rugs and other things on the floor that can make you trip. What can I do in the kitchen?  Clean up any spills right away.  Avoid walking on wet floors.  Keep items that you use a lot in easy-to-reach places.  If you need to reach something above you, use a strong step stool that has a  grab bar.  Keep electrical cords out of the way.  Do not use floor polish or wax that makes floors slippery. If you must use wax, use non-skid floor wax.  Do not have throw rugs and other things on the floor that can make you trip. What can I do with my stairs?  Do not leave any items on the stairs.  Make sure that there are handrails on both sides of the stairs and use them. Fix handrails that are broken or loose. Make sure that handrails are as long as the stairways.  Check any carpeting to make sure that it is firmly attached to the stairs. Fix any carpet that is loose or worn.  Avoid having throw rugs at the top or bottom of the stairs. If you do have throw rugs, attach them to the floor with carpet tape.  Make sure that you have a light switch at the top of the stairs and the bottom of the stairs. If you do not have them,  ask someone to add them for you. What else can I do to help prevent falls?  Wear shoes that:  Do not have high heels.  Have rubber bottoms.  Are comfortable and fit you well.  Are closed at the toe. Do not wear sandals.  If you use a stepladder:  Make sure that it is fully opened. Do not climb a closed stepladder.  Make sure that both sides of the stepladder are locked into place.  Ask someone to hold it for you, if possible.  Clearly mark and make sure that you can see:  Any grab bars or handrails.  First and last steps.  Where the edge of each step is.  Use tools that help you move around (mobility aids) if they are needed. These include:  Canes.  Walkers.  Scooters.  Crutches.  Turn on the lights when you go into a dark area. Replace any light bulbs as soon as they burn out.  Set up your furniture so you have a clear path. Avoid moving your furniture around.  If any of your floors are uneven, fix them.  If there are any pets around you, be aware of where they are.  Review your medicines with your doctor. Some medicines can make you feel dizzy. This can increase your chance of falling. Ask your doctor what other things that you can do to help prevent falls. This information is not intended to replace advice given to you by your health care provider. Make sure you discuss any questions you have with your health care provider. Document Released: 08/18/2009 Document Revised: 03/29/2016 Document Reviewed: 11/26/2014 Elsevier Interactive Patient Education  2017 [employer].

## 2018-12-25 LAB — MICROALBUMIN, URINE: Microalb, Ur: 20

## 2018-12-25 LAB — CBC AND DIFFERENTIAL
Hemoglobin: 11.9 — AB (ref 12.0–16.0)
WBC: 6.7

## 2018-12-25 LAB — BASIC METABOLIC PANEL
BUN: 20 (ref 4–21)
Creatinine: 1.3 — AB (ref 0.5–1.1)
Potassium: 4 (ref 3.4–5.3)

## 2019-01-20 ENCOUNTER — Other Ambulatory Visit: Payer: Self-pay

## 2019-01-20 ENCOUNTER — Inpatient Hospital Stay: Payer: Medicare Other | Attending: Oncology

## 2019-01-20 DIAGNOSIS — E538 Deficiency of other specified B group vitamins: Secondary | ICD-10-CM | POA: Diagnosis not present

## 2019-01-20 MED ORDER — CYANOCOBALAMIN 1000 MCG/ML IJ SOLN
1000.0000 ug | Freq: Once | INTRAMUSCULAR | Status: AC
  Administered 2019-01-20: 1000 ug via INTRAMUSCULAR

## 2019-02-02 ENCOUNTER — Encounter: Payer: Self-pay | Admitting: Family Medicine

## 2019-02-02 ENCOUNTER — Ambulatory Visit (INDEPENDENT_AMBULATORY_CARE_PROVIDER_SITE_OTHER): Payer: Medicare Other | Admitting: Family Medicine

## 2019-02-02 ENCOUNTER — Other Ambulatory Visit: Payer: Self-pay

## 2019-02-02 VITALS — BP 131/88 | HR 59 | Temp 97.4°F | Ht 65.0 in | Wt 166.0 lb

## 2019-02-02 DIAGNOSIS — I700 Atherosclerosis of aorta: Secondary | ICD-10-CM

## 2019-02-02 DIAGNOSIS — E785 Hyperlipidemia, unspecified: Secondary | ICD-10-CM | POA: Diagnosis not present

## 2019-02-02 DIAGNOSIS — J3089 Other allergic rhinitis: Secondary | ICD-10-CM

## 2019-02-02 DIAGNOSIS — I10 Essential (primary) hypertension: Secondary | ICD-10-CM | POA: Diagnosis not present

## 2019-02-02 DIAGNOSIS — J302 Other seasonal allergic rhinitis: Secondary | ICD-10-CM

## 2019-02-02 DIAGNOSIS — N2581 Secondary hyperparathyroidism of renal origin: Secondary | ICD-10-CM

## 2019-02-02 DIAGNOSIS — N183 Chronic kidney disease, stage 3 unspecified: Secondary | ICD-10-CM

## 2019-02-02 DIAGNOSIS — E538 Deficiency of other specified B group vitamins: Secondary | ICD-10-CM

## 2019-02-02 MED ORDER — FLUTICASONE PROPIONATE 50 MCG/ACT NA SUSP: 2.0000 | Freq: Every day | NASAL | 1 refills | Status: DC

## 2019-02-02 MED ORDER — ROSUVASTATIN CALCIUM 20 MG PO TABS: 20.0000 mg | ORAL_TABLET | Freq: Every day | ORAL | 1 refills | Status: DC

## 2019-02-02 MED ORDER — LOSARTAN POTASSIUM-HCTZ 100-12.5 MG PO TABS: 1.0000 | ORAL_TABLET | Freq: Every day | ORAL | 1 refills | Status: DC

## 2019-02-02 NOTE — Progress Notes (Signed)
Name: Morgan Livingston   MRN: [id number]    DOB: 08/20/1946   Date:02/02/2019       Progress Note  Subjective  Chief Complaint  Chief Complaint  Patient presents with  . Hypertension  . Dyslipidemia    I connected with@ on 02/02/19 at  9:20 AM EDT by a video enabled telemedicine application and verified that I am speaking with the correct person using two identifiers.  I discussed the limitations of evaluation and management by telemedicine and the availability of in person appointments. The patient expressed understanding and agreed to proceed. Staff also discussed with the patient that there may be a patient responsible charge related to this service. Patient Location: home  Provider Location[facility]    HPI  B12 deficiency /Thrombocytopenia  : under the care of [name], last B12 improved, she is still getting B12 injection monthly now. She also has thrombocytopenia and levels stable   Atherosclerosis aorta/dyslipidemia: found on X-ray of lumbar spine, she does not have claudication. She will continue statin and aspirin daily , last lipid was one year ago, we will hold off on rechecking until her next visit. Avoid coming to our office due to COVID-19 She denies myalgias.   Proteinuria and CKI stage III: seeing [name], good urine output, drinking lots of fluids, she has secondary hyperparathyroidism. She denies cramping or heartburn. Last GFR done in Feb at Dr. Collie Siad office - it was 6  Perennial Allergic Rhinitis with seasonal variation: she is using nasal spray and taking allegra. She states she has itchy eyes ( using eye drop otc ), no nasal congestion or rhinorrhea.   HTN: she is off medications at this time. BP at Dr. Collie Siad office was 144/89, at home it has been 130's/80's . She denies chest pain or palpitation.   Change in bowel movements: seen by Dr. Vicente Males 10/2018, had colonoscopy that showed tubular adenomas, but negative for colitis . Symptoms of diarrhea has resolved     Patient Active Problem List   Diagnosis Date Noted  . MGUS (monoclonal gammopathy of unknown significance) 09/01/2018  . Secondary hyperparathyroidism ([place]) 09/01/2018  . B12 deficiency 08/19/2018  . Atherosclerosis of aorta ([place]) 07/25/2018  . Thrombocytopenia ([place]) 05/09/2018  . Proteinuria 05/09/2018  . Positive ANA (antinuclear antibody) 04/02/2018  . Bilateral dry eyes 04/02/2018  . History of hysterectomy 05/23/2015  . Chronic kidney disease, stage III (moderate) ([place]) 05/23/2015  . Benign hypertension 05/22/2015  . Chronic constipation 05/22/2015  . DDD (degenerative disc disease), lumbar 05/22/2015  . DD (diverticular disease) 05/22/2015  . Dyslipidemia 05/22/2015  . History of cervical cancer 05/22/2015  . Helicobacter pylori gastrointestinal tract infection 05/22/2015  . Blood glucose elevated 05/22/2015  . Overweight (BMI 25.0-29.9) 05/22/2015  . Perennial allergic rhinitis with seasonal variation 05/22/2015  . Vitamin D deficiency 05/22/2015  . Impingement syndrome of shoulder 01/14/2015    Past Surgical History:  Procedure Laterality Date  . ABDOMINAL HYSTERECTOMY    . BREAST BIOPSY Right 04/09/2006   negative  . BREAST BIOPSY Left 12/04/2017   fibroadenmatoid change with coarse calcs  . COLONOSCOPY WITH PROPOFOL N/A 10/07/2018   Procedure: COLONOSCOPY WITH PROPOFOL;  Surgeon: [name], MD;  Location: [facility] ENDOSCOPY;  Service: Gastroenterology;  Laterality: N/A;    Family History  Problem Relation Age of Onset  . Congestive Heart Failure Mother 31  . Heart attack Father 70  . Sinusitis Sister   . Stomach cancer Sister   . Uterine cancer Sister   .  Liver cancer Sister   . Pancreatic cancer Sister   . Dementia Sister        40  . Breast cancer Neg Hx     Social History   Socioeconomic History  . Marital status: Married    Spouse name: Paramedic  . Number of children: 2  . Years of education: some college  . Highest education level: 12th grade   Occupational History  . 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 Financial resource strain: Not hard at all  . Food insecurity:    Worry: Never true    Inability: Never true  . Transportation needs:    Medical: No    Non-medical: No  Tobacco Use  . Smoking status: Former Smoker    Packs/day: 1.00    Years: 2.00    Pack years: 2.00    Types: Cigarettes    Last attempt to quit: 1963    Years since quitting: 57.2  . Smokeless tobacco: Never Used  . Tobacco comment: smoking cessation materials not required  Substance and Sexual Activity  . Alcohol use: No    Alcohol/week: 0.0 standard drinks  . Drug use: No  . Sexual activity: Yes    Partners: Male    Birth control/protection: Post-menopausal  Lifestyle  . Physical activity:    Days per week: 0 days    Minutes per session: 0 min  . Stress: Not at all  Relationships  . Social connections:    Talks on phone: More than three times a week    Gets together: More than three times a week    Attends religious service: More than 4 times per year    Active member of club or organization: Yes    Attends meetings of clubs or organizations: More than 4 times per year    Relationship status: Married  . Intimate partner violence:    Fear of current or ex partner: No    Emotionally abused: No    Physically abused: No    Forced sexual activity: No  Other Topics Concern  . Not on file  Social History Narrative   Married for 50 years      Current Outpatient Medications:  .  aspirin 81 MG tablet, Take 81 mg by mouth daily., Disp: , Rfl:  .  Biotin 5 MG CAPS, Take by mouth daily., Disp: , Rfl:  .  cholecalciferol (VITAMIN D) 1000 UNITS tablet, Take by mouth., Disp: , Rfl:  .  fexofenadine (ALLEGRA) 180 MG tablet, Take 180 mg by mouth daily., Disp: , Rfl:  .  fluticasone (FLONASE) 50 MCG/ACT nasal spray, Place 2 sprays into both nostrils daily., Disp: 16 g, Rfl: 1 .  losartan-hydrochlorothiazide  (HYZAAR) 100-12.5 MG tablet, Take 1 tablet by mouth daily., Disp: 90 tablet, Rfl: 1 .  rosuvastatin (CRESTOR) 20 MG tablet, Take 1 tablet (20 mg total) by mouth daily., Disp: 90 tablet, Rfl: 1  No Known Allergies  I personally reviewed active problem list, medication list, allergies, family history, social history with the patient/caregiver today.   ROS  Constitutional: Negative for fever or weight change. Positive for fatigue  Respiratory: Negative for cough and shortness of breath.   Cardiovascular: Negative for chest pain or palpitations.  Gastrointestinal: Negative for abdominal pain, no bowel changes.  Musculoskeletal: Negative for gait problem or joint swelling.  Skin: Negative for rash.  Neurological: Negative for dizziness or headache.  No other specific complaints in a  complete review of systems (except as listed in HPI above).  Objective  Virtual encounter, vitals not obtained.  Body mass index is 27.62 kg/m.  Physical Exam  Awake, alert, normal speech pattern.    PHQ2/9: Depression screen [facility] 2/9 02/02/2019 12/23/2018 09/01/2018 07/25/2018 01/22/2018  Decreased Interest 0 0 0 0 0  Down, Depressed, Hopeless 0 0 0 0 0  PHQ - 2 Score 0 0 0 0 0  Altered sleeping 0 - 0 - -  Tired, decreased energy 0 - 0 - -  Change in appetite 0 - 0 - -  Feeling bad or failure about yourself  0 - 0 - -  Trouble concentrating 0 - 0 - -  Moving slowly or fidgety/restless 0 - 0 - -  Suicidal thoughts 0 - 0 - -  PHQ-9 Score 0 - 0 - -  Difficult doing work/chores - - Not difficult at all - -   PHQ-2/9 Result is negative.    Fall Risk: Fall Risk  02/02/2019 12/23/2018 10/24/2018 09/01/2018 07/25/2018  Falls in the past year? 0 0 0 No No  Number falls in past yr: 0 0 0 - -  Injury with Fall? 0 0 0 - -  Follow up - Falls prevention discussed - - -     Assessment & Plan  1. Perennial allergic rhinitis with seasonal variation  - fluticasone (FLONASE) 50 MCG/ACT nasal spray; Place 2  sprays into both nostrils daily.  Dispense: 16 g; Refill: 1  2. Essential hypertension  - losartan-hydrochlorothiazide (HYZAAR) 100-12.5 MG tablet; Take 1 tablet by mouth daily.  Dispense: 90 tablet; Refill: 1  3. Dyslipidemia  - rosuvastatin (CRESTOR) 20 MG tablet; Take 1 tablet (20 mg total) by mouth daily.  Dispense: 90 tablet; Refill: 1  4. Atherosclerosis of abdominal aorta ([facility])  On statin and aspirin   5. Secondary hyperparathyroidism ([place])   6. B12 deficiency  Getting B12 from [name]   7. Stage 3 chronic kidney disease ([place])  Keep follow up with nephrologist  I discussed the assessment and treatment plan with the patient. The patient was provided an opportunity to ask questions and all were answered. The patient agreed with the plan and demonstrated an understanding of the instructions.  The patient was advised to call back or seek an in-person evaluation if the symptoms worsen or if the condition fails to improve as anticipated.    I provided 26  minutes of non-face-to-face time during this encounter.

## 2019-02-17 ENCOUNTER — Inpatient Hospital Stay: Payer: Medicare Other | Attending: Oncology

## 2019-02-17 ENCOUNTER — Other Ambulatory Visit: Payer: Self-pay

## 2019-02-17 DIAGNOSIS — E538 Deficiency of other specified B group vitamins: Secondary | ICD-10-CM | POA: Diagnosis present

## 2019-02-17 MED ORDER — CYANOCOBALAMIN 1000 MCG/ML IJ SOLN
1000.0000 ug | Freq: Once | INTRAMUSCULAR | Status: AC
  Administered 2019-02-17: 1000 ug via INTRAMUSCULAR

## 2019-03-01 ENCOUNTER — Other Ambulatory Visit: Payer: Self-pay | Admitting: Family Medicine

## 2019-03-01 DIAGNOSIS — J3089 Other allergic rhinitis: Principal | ICD-10-CM

## 2019-03-01 DIAGNOSIS — J302 Other seasonal allergic rhinitis: Secondary | ICD-10-CM

## 2019-03-19 ENCOUNTER — Other Ambulatory Visit: Payer: Self-pay

## 2019-03-20 ENCOUNTER — Inpatient Hospital Stay: Payer: Medicare Other

## 2019-03-20 ENCOUNTER — Other Ambulatory Visit: Payer: Self-pay

## 2019-03-20 ENCOUNTER — Inpatient Hospital Stay: Payer: Medicare Other | Attending: Oncology

## 2019-03-20 DIAGNOSIS — Z87442 Personal history of urinary calculi: Secondary | ICD-10-CM | POA: Diagnosis not present

## 2019-03-20 DIAGNOSIS — Z8249 Family history of ischemic heart disease and other diseases of the circulatory system: Secondary | ICD-10-CM | POA: Insufficient documentation

## 2019-03-20 DIAGNOSIS — Z820 Family history of epilepsy and other diseases of the nervous system: Secondary | ICD-10-CM | POA: Diagnosis not present

## 2019-03-20 DIAGNOSIS — Z800 Family history of malignant neoplasm of digestive organs: Secondary | ICD-10-CM | POA: Insufficient documentation

## 2019-03-20 DIAGNOSIS — Z79899 Other long term (current) drug therapy: Secondary | ICD-10-CM | POA: Insufficient documentation

## 2019-03-20 DIAGNOSIS — Z809 Family history of malignant neoplasm, unspecified: Secondary | ICD-10-CM | POA: Diagnosis not present

## 2019-03-20 DIAGNOSIS — D696 Thrombocytopenia, unspecified: Secondary | ICD-10-CM

## 2019-03-20 DIAGNOSIS — Z8542 Personal history of malignant neoplasm of other parts of uterus: Secondary | ICD-10-CM | POA: Insufficient documentation

## 2019-03-20 DIAGNOSIS — E538 Deficiency of other specified B group vitamins: Secondary | ICD-10-CM | POA: Diagnosis not present

## 2019-03-20 DIAGNOSIS — R5382 Chronic fatigue, unspecified: Secondary | ICD-10-CM | POA: Insufficient documentation

## 2019-03-20 DIAGNOSIS — Z87891 Personal history of nicotine dependence: Secondary | ICD-10-CM | POA: Insufficient documentation

## 2019-03-20 LAB — COMPREHENSIVE METABOLIC PANEL
ALT: 19 U/L (ref 0–44)
AST: 26 U/L (ref 15–41)
Albumin: 3.9 g/dL (ref 3.5–5.0)
Alkaline Phosphatase: 60 U/L (ref 38–126)
Anion gap: 7 (ref 5–15)
BUN: 15 mg/dL (ref 8–23)
CO2: 27 mmol/L (ref 22–32)
Calcium: 9.2 mg/dL (ref 8.9–10.3)
Chloride: 104 mmol/L (ref 98–111)
Creatinine, Ser: 1.38 mg/dL — ABNORMAL HIGH (ref 0.44–1.00)
GFR calc Af Amer: 44 mL/min — ABNORMAL LOW (ref >60)
GFR calc non Af Amer: 38 mL/min — ABNORMAL LOW (ref >60)
Glucose, Bld: 88 mg/dL (ref 70–99)
Potassium: 3.9 mmol/L (ref 3.5–5.1)
Sodium: 138 mmol/L (ref 135–145)
Total Bilirubin: 0.6 mg/dL (ref 0.3–1.2)
Total Protein: 7.7 g/dL (ref 6.5–8.1)

## 2019-03-20 LAB — CBC WITH DIFFERENTIAL/PLATELET
Abs Immature Granulocytes: 0.01 10*3/uL (ref 0.00–0.07)
Basophils Absolute: 0 10*3/uL (ref 0.0–0.1)
Basophils Relative: 0 %
Eosinophils Absolute: 0.1 10*3/uL (ref 0.0–0.5)
Eosinophils Relative: 1 %
HCT: 35.6 % — ABNORMAL LOW (ref 36.0–46.0)
Hemoglobin: 11.9 g/dL — ABNORMAL LOW (ref 12.0–15.0)
Immature Granulocytes: 0 %
Lymphocytes Relative: 38 %
Lymphs Abs: 2.5 10*3/uL (ref 0.7–4.0)
MCH: 30.7 pg (ref 26.0–34.0)
MCHC: 33.4 g/dL (ref 30.0–36.0)
MCV: 92 fL (ref 80.0–100.0)
Monocytes Absolute: 0.6 10*3/uL (ref 0.1–1.0)
Monocytes Relative: 8 %
Neutro Abs: 3.5 10*3/uL (ref 1.7–7.7)
Neutrophils Relative %: 53 %
Platelets: 137 10*3/uL — ABNORMAL LOW (ref 150–400)
RBC: 3.87 MIL/uL (ref 3.87–5.11)
RDW: 14.1 % (ref 11.5–15.5)
WBC: 6.6 10*3/uL (ref 4.0–10.5)
nRBC: 0 % (ref 0.0–0.2)

## 2019-03-20 MED ORDER — CYANOCOBALAMIN 1000 MCG/ML IJ SOLN
1000.0000 ug | Freq: Once | INTRAMUSCULAR | Status: AC
  Administered 2019-03-20: 1000 ug via INTRAMUSCULAR

## 2019-03-21 LAB — VITAMIN B12: Vitamin B-12: 429 pg/mL (ref 180–914)

## 2019-03-23 ENCOUNTER — Other Ambulatory Visit: Payer: Self-pay

## 2019-03-23 ENCOUNTER — Other Ambulatory Visit: Payer: Medicare Other

## 2019-03-23 ENCOUNTER — Ambulatory Visit: Payer: Medicare Other

## 2019-03-23 ENCOUNTER — Encounter: Payer: Self-pay | Admitting: Oncology

## 2019-03-23 ENCOUNTER — Inpatient Hospital Stay (HOSPITAL_BASED_OUTPATIENT_CLINIC_OR_DEPARTMENT_OTHER): Payer: Medicare Other | Admitting: Oncology

## 2019-03-23 DIAGNOSIS — Z87891 Personal history of nicotine dependence: Secondary | ICD-10-CM | POA: Diagnosis not present

## 2019-03-23 DIAGNOSIS — D696 Thrombocytopenia, unspecified: Secondary | ICD-10-CM | POA: Diagnosis not present

## 2019-03-23 DIAGNOSIS — E538 Deficiency of other specified B group vitamins: Secondary | ICD-10-CM | POA: Diagnosis not present

## 2019-03-23 LAB — ANTI-PARIETAL ANTIBODY: Parietal Cell Antibody-IgG: 4 Units (ref 0.0–20.0)

## 2019-03-23 LAB — INTRINSIC FACTOR ANTIBODIES: Intrinsic Factor: 1 AU/mL (ref 0.0–1.1)

## 2019-03-23 MED ORDER — VITAMIN B-12 1000 MCG PO TABS: 1000.0000 ug | ORAL_TABLET | Freq: Every day | ORAL | 1 refills | Status: DC

## 2019-03-23 NOTE — Progress Notes (Signed)
Called patient for Telehealth visit via [employer].  Patient states no new concerns today.  

## 2019-03-23 NOTE — Progress Notes (Signed)
HEMATOLOGY-ONCOLOGY TeleHEALTH VISIT PROGRESS NOTE  I connected with Morgan Livingston on 03/23/19 at 10:15 AM EDT by video enabled telemedicine visit and verified that I am speaking with the correct person using two identifiers. I discussed the limitations, risks, security and privacy concerns of performing an evaluation and management service by telemedicine and the availability of in-person appointments. I also discussed with the patient that there may be a patient responsible charge related to this service. The patient expressed understanding and agreed to proceed.   Other persons participating in the visit and their role in the encounter:  Janeann Merl, RN, check in patient.   Patient's location: Home  Provider's location: Home office Chief Complaint: Follow up for management for vitamin B12 deficiency and thrombocytopenia.   INTERVAL HISTORY Morgan Livingston is a 73 y.o. female who has above history reviewed by me today presents for follow up visit for management of vitamin B12 deficiency and thrombocytopenia. Problems and complaints are listed below:  Patient has no new complaints today.  Vitamin B-12 injection was canceled due to COVID-19 pandemic. She received this months vitamin B12 injection a few days ago. Reports doing well at baseline.  Denies any fever, chills, nausea, vomiting, diarrhea, chest pain, or abdominal pain. Denies any easy bruising or bleeding events.  Review of Systems  Constitutional: Negative for appetite change, chills, fatigue and fever.  HENT:   Negative for hearing loss and voice change.   Eyes: Negative for eye problems.  Respiratory: Negative for chest tightness and cough.   Cardiovascular: Negative for chest pain.  Gastrointestinal: Negative for abdominal distention, abdominal pain and blood in stool.  Endocrine: Negative for hot flashes.  Genitourinary: Negative for difficulty urinating and frequency.   Musculoskeletal: Negative for arthralgias.   Skin: Negative for itching and rash.  Neurological: Negative for extremity weakness.  Hematological: Negative for adenopathy.  Psychiatric/Behavioral: Negative for confusion.    Past Medical History:  Diagnosis Date  . Allergy   . Cancer [facility])    Uterine Cancer  . Hyperlipidemia   . Hypertension   . Kidney stones 06/21/2018   Past Surgical History:  Procedure Laterality Date  . ABDOMINAL HYSTERECTOMY    . BREAST BIOPSY Right 04/09/2006   negative  . BREAST BIOPSY Left 12/04/2017   fibroadenmatoid change with coarse calcs  . COLONOSCOPY WITH PROPOFOL N/A 10/07/2018   Procedure: COLONOSCOPY WITH PROPOFOL;  Surgeon: [name], MD;  Location: [facility] ENDOSCOPY;  Service: Gastroenterology;  Laterality: N/A;    Family History  Problem Relation Age of Onset  . Congestive Heart Failure Mother 14  . Heart attack Father 1  . Sinusitis Sister   . Stomach cancer Sister   . Uterine cancer Sister   . Liver cancer Sister   . Pancreatic cancer Sister   . Dementia Sister        8  . Breast cancer Neg Hx     Social History   Socioeconomic History  . Marital status: Married    Spouse name: Paramedic  . Number of children: 2  . Years of education: some college  . Highest education level: 12th grade  Occupational History  . 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 Financial resource strain: Not hard at all  . Food insecurity:    Worry: Never true    Inability: Never true  . Transportation needs:    Medical: No    Non-medical: No  Tobacco Use  . Smoking status:  Former Smoker    Packs/day: 1.00    Years: 2.00    Pack years: 2.00    Types: Cigarettes    Last attempt to quit: 1963    Years since quitting: 57.4  . Smokeless tobacco: Never Used  . Tobacco comment: smoking cessation materials not required  Substance and Sexual Activity  . Alcohol use: No    Alcohol/week: 0.0 standard drinks  . Drug use: No  . Sexual activity: Yes    Partners:  Male    Birth control/protection: Post-menopausal  Lifestyle  . Physical activity:    Days per week: 0 days    Minutes per session: 0 min  . Stress: Not at all  Relationships  . Social connections:    Talks on phone: More than three times a week    Gets together: More than three times a week    Attends religious service: More than 4 times per year    Active member of club or organization: Yes    Attends meetings of clubs or organizations: More than 4 times per year    Relationship status: Married  . Intimate partner violence:    Fear of current or ex partner: No    Emotionally abused: No    Physically abused: No    Forced sexual activity: No  Other Topics Concern  . Not on file  Social History Narrative   Married for 50 years     Current Outpatient Medications on File Prior to Visit  Medication Sig Dispense Refill  . aspirin 81 MG tablet Take 81 mg by mouth daily.    . Biotin 5 MG CAPS Take by mouth daily.    . cholecalciferol (VITAMIN D) 1000 UNITS tablet Take by mouth.    . fexofenadine (ALLEGRA) 180 MG tablet Take 180 mg by mouth daily.    . fluticasone (FLONASE) 50 MCG/ACT nasal spray SPRAY 2 SPRAYS INTO EACH NOSTRIL EVERY DAY 48 g 1  . losartan-hydrochlorothiazide (HYZAAR) 100-12.5 MG tablet Take 1 tablet by mouth daily. 90 tablet 1  . rosuvastatin (CRESTOR) 20 MG tablet Take 1 tablet (20 mg total) by mouth daily. 90 tablet 1   No current facility-administered medications on file prior to visit.     No Known Allergies     Observations/Objective: Today's Vitals   03/23/19 1003  PainSc: 0-No pain   There is no height or weight on file to calculate BMI.  Physical Exam  Constitutional: She is oriented to person, place, and time. No distress.  HENT:  Head: Normocephalic and atraumatic.  Mouth/Throat: No oropharyngeal exudate.  Eyes: Pupils are equal, round, and reactive to light. EOM are normal. No scleral icterus.  Neck: Normal range of motion. Neck supple.   Cardiovascular: Normal rate and regular rhythm.  No murmur heard. Pulmonary/Chest: Effort normal. No respiratory distress. She has no rales.  Abdominal: Soft. She exhibits no distension. There is no abdominal tenderness.  Musculoskeletal: Normal range of motion.        General: No edema.  Neurological: She is alert and oriented to person, place, and time. No cranial nerve deficit. She exhibits normal muscle tone. Coordination normal.  Skin: Skin is warm and dry. She is not diaphoretic. No erythema.  Psychiatric: Affect normal.    CBC    Component Value Date/Time   WBC 6.6 03/20/2019 1305   RBC 3.87 03/20/2019 1305   HGB 11.9 (L) 03/20/2019 1305   HGB 12.4 01/03/2016 0915   HCT 35.6 (L) 03/20/2019 1305  HCT 36.2 01/03/2016 0915   PLT 137 (L) 03/20/2019 1305   PLT 155 01/03/2016 0915   MCV 92.0 03/20/2019 1305   MCV 91 01/03/2016 0915   MCV 95 12/24/2011 1045   MCH 30.7 03/20/2019 1305   MCHC 33.4 03/20/2019 1305   RDW 14.1 03/20/2019 1305   RDW 14.6 01/03/2016 0915   LYMPHSABS 2.5 03/20/2019 1305   LYMPHSABS 3.5 (H) 01/03/2016 0915   MONOABS 0.6 03/20/2019 1305   EOSABS 0.1 03/20/2019 1305   EOSABS 0.1 01/03/2016 0915   BASOSABS 0.0 03/20/2019 1305   BASOSABS 0.0 01/03/2016 0915    CMP     Component Value Date/Time   NA 138 03/20/2019 1305   NA 143 01/03/2016 0915   NA 138 12/24/2011 1045   K 3.9 03/20/2019 1305   K 4.0 12/24/2011 1045   CL 104 03/20/2019 1305   CL 100 12/24/2011 1045   CO2 27 03/20/2019 1305   CO2 30 12/24/2011 1045   GLUCOSE 88 03/20/2019 1305   GLUCOSE 102 (H) 12/24/2011 1045   BUN 15 03/20/2019 1305   BUN 15 01/03/2016 0915   BUN 18 12/24/2011 1045   CREATININE 1.38 (H) 03/20/2019 1305   CREATININE 1.28 (H) 01/22/2018 0952   CALCIUM 9.2 03/20/2019 1305   CALCIUM 9.8 12/24/2011 1045   PROT 7.7 03/20/2019 1305   PROT 7.4 01/03/2016 0915   PROT 8.6 (H) 12/24/2011 1045   ALBUMIN 3.9 03/20/2019 1305   ALBUMIN 4.1 01/03/2016 0915    ALBUMIN 3.9 12/24/2011 1045   AST 26 03/20/2019 1305   AST 30 12/24/2011 1045   ALT 19 03/20/2019 1305   ALT 28 12/24/2011 1045   ALKPHOS 60 03/20/2019 1305   ALKPHOS 51 12/24/2011 1045   BILITOT 0.6 03/20/2019 1305   BILITOT 0.3 01/03/2016 0915   BILITOT 0.4 12/24/2011 1045   GFRNONAA 38 (L) 03/20/2019 1305   GFRNONAA 42 (L) 01/22/2018 0952   GFRAA 44 (L) 03/20/2019 1305   GFRAA 49 (L) 01/22/2018 0952     Assessment and Plan: 1. B12 deficiency   2. Thrombocytopenia ([place])     Labs are reviewed and discussed with patient. Vitamin B12 deficiency, on monthly vitamin B12 injections. Recent B12 level shows improvement. Negative intrinsic factor antibodies and parietal cell antibody Can convert to oral vitamin b12 1000 mcg daily   #Chronic thrombocytopenia, stable.  Slightly better.  Continue to monitor.   Follow Up Instructions: Follow-up in 4 months.  Lab MD assessment, CBC CMP vitamin B12.   I discussed the assessment and treatment plan with the patient. The patient was provided an opportunity to ask questions and all were answered. The patient agreed with the plan and demonstrated an understanding of the instructions.  The patient was advised to call back or seek an in-person evaluation if the symptoms worsen or if the condition fails to improve as anticipated.   I provided 15 minutes of face-to-face video visit time during this encounter, and > 50% was spent counseling as documented under my assessment & plan.  Earlie Server, MD 03/23/2019 5:07 PM

## 2019-05-15 ENCOUNTER — Encounter: Payer: Self-pay | Admitting: Family Medicine

## 2019-05-19 ENCOUNTER — Encounter: Payer: Self-pay | Admitting: Family Medicine

## 2019-06-05 ENCOUNTER — Ambulatory Visit: Payer: Medicare Other | Admitting: Family Medicine

## 2019-06-05 ENCOUNTER — Encounter: Payer: Self-pay | Admitting: Family Medicine

## 2019-06-05 ENCOUNTER — Other Ambulatory Visit: Payer: Self-pay

## 2019-06-05 VITALS — BP 120/70 | HR 70 | Temp 96.9°F | Resp 16 | Ht 65.0 in | Wt 172.4 lb

## 2019-06-05 DIAGNOSIS — E559 Vitamin D deficiency, unspecified: Secondary | ICD-10-CM

## 2019-06-05 DIAGNOSIS — R739 Hyperglycemia, unspecified: Secondary | ICD-10-CM

## 2019-06-05 DIAGNOSIS — D472 Monoclonal gammopathy: Secondary | ICD-10-CM

## 2019-06-05 DIAGNOSIS — I129 Hypertensive chronic kidney disease with stage 1 through stage 4 chronic kidney disease, or unspecified chronic kidney disease: Secondary | ICD-10-CM

## 2019-06-05 DIAGNOSIS — I700 Atherosclerosis of aorta: Secondary | ICD-10-CM | POA: Diagnosis not present

## 2019-06-05 DIAGNOSIS — D691 Qualitative platelet defects: Secondary | ICD-10-CM

## 2019-06-05 DIAGNOSIS — I10 Essential (primary) hypertension: Secondary | ICD-10-CM

## 2019-06-05 DIAGNOSIS — N2581 Secondary hyperparathyroidism of renal origin: Secondary | ICD-10-CM

## 2019-06-05 DIAGNOSIS — E538 Deficiency of other specified B group vitamins: Secondary | ICD-10-CM | POA: Diagnosis not present

## 2019-06-05 DIAGNOSIS — E785 Hyperlipidemia, unspecified: Secondary | ICD-10-CM

## 2019-06-05 DIAGNOSIS — N183 Chronic kidney disease, stage 3 unspecified: Secondary | ICD-10-CM

## 2019-06-05 NOTE — Progress Notes (Signed)
Name: Morgan Livingston   MRN: [id number]    DOB: 05/23/1946   Date:06/05/2019       Progress Note  Subjective  Chief Complaint  Chief Complaint  Patient presents with  . Hypertension  . Hypothyroidism  . Dyslipidemia    HPI  B12 deficiency /Thrombocytopenia/MUGS  : under the care of [name], last B12 improved, she was recently switched to oral supplementation.. She also has thrombocytopenia and levels stable   Atherosclerosis aorta/dyslipidemia: found on X-ray of lumbar spine, she does not have claudication. She will continue statin and aspirin daily , we will recheck labs today  Proteinuria and CKI stage III: seeing [name], good urine output, drinking lots of fluids, she has secondary hyperparathyroidism,and vitamin D deficiency.. She denies cramping, pruritis  or heartburn. Last GFR was stable CKI stage III , we will recheck labs  HTN: she is back on medication losartan/hctz and bp at home has been in the 120's, denies chest pain or palpitation, recently seen by [name] and bp was 150/90 and dose of losartan hctz was changed from 100/12.5 to 100/25, she will start new dose today, she usually takes it around 10 am, bp in our office is normal, advised to monitor for low bp , discussed dizziness, orthostatic changes   Hyperglycemia: no family history of diabetes, A1C has been high, she likes to bake, we will recheck A1C. Denies polyphagia, polydipsia or polyuria   Patient Active Problem List   Diagnosis Date Noted  . MGUS (monoclonal gammopathy of unknown significance) 09/01/2018  . Secondary hyperparathyroidism ([place]) 09/01/2018  . B12 deficiency 08/19/2018  . Atherosclerosis of aorta ([place]) 07/25/2018  . Thrombocytopenia ([place]) 05/09/2018  . Proteinuria 05/09/2018  . Positive ANA (antinuclear antibody) 04/02/2018  . Bilateral dry eyes 04/02/2018  . History of hysterectomy 05/23/2015  . Chronic kidney disease, stage III (moderate) ([place]) 05/23/2015  . Benign hypertension  05/22/2015  . Chronic constipation 05/22/2015  . DDD (degenerative disc disease), lumbar 05/22/2015  . DD (diverticular disease) 05/22/2015  . Dyslipidemia 05/22/2015  . History of cervical cancer 05/22/2015  . Helicobacter pylori gastrointestinal tract infection 05/22/2015  . Blood glucose elevated 05/22/2015  . Overweight (BMI 25.0-29.9) 05/22/2015  . Perennial allergic rhinitis with seasonal variation 05/22/2015  . Vitamin D deficiency 05/22/2015  . Impingement syndrome of shoulder 01/14/2015    Past Surgical History:  Procedure Laterality Date  . ABDOMINAL HYSTERECTOMY    . BREAST BIOPSY Right 04/09/2006   negative  . BREAST BIOPSY Left 12/04/2017   fibroadenmatoid change with coarse calcs  . COLONOSCOPY WITH PROPOFOL N/A 10/07/2018   Procedure: COLONOSCOPY WITH PROPOFOL;  Surgeon: [name], MD;  Location[facility] ENDOSCOPY;  Service: Gastroenterology;  Laterality: N/A;    Family History  Problem Relation Age of Onset  . Congestive Heart Failure Mother 30  . Heart attack Father 77  . Sinusitis Sister   . Stomach cancer Sister   . Uterine cancer Sister   . Liver cancer Sister   . Pancreatic cancer Sister   . Dementia Sister        74  . Breast cancer Neg Hx     Social History   Socioeconomic History  . Marital status: Married    Spouse name: Paramedic  . Number of children: 2  . Years of education: some college  . Highest education level: 12th grade  Occupational History  . 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 Financial resource strain:  Not hard at all  . Food insecurity    Worry: Never true    Inability: Never true  . Transportation needs    Medical: No    Non-medical: No  Tobacco Use  . Smoking status: Former Smoker    Packs/day: 1.00    Years: 2.00    Pack years: 2.00    Types: Cigarettes    Quit date: 1963    Years since quitting: 57.6  . Smokeless tobacco: Never Used  . Tobacco comment: smoking cessation materials  not required  Substance and Sexual Activity  . Alcohol use: No    Alcohol/week: 0.0 standard drinks  . Drug use: No  . Sexual activity: Yes    Partners: Male    Birth control/protection: Post-menopausal  Lifestyle  . Physical activity    Days per week: 7 days    Minutes per session: 20 min  . Stress: Not at all  Relationships  . Social connections    Talks on phone: More than three times a week    Gets together: More than three times a week    Attends religious service: More than 4 times per year    Active member of club or organization: Yes    Attends meetings of clubs or organizations: More than 4 times per year    Relationship status: Married  . Intimate partner violence    Fear of current or ex partner: No    Emotionally abused: No    Physically abused: No    Forced sexual activity: No  Other Topics Concern  . Not on file  Social History Narrative   Married for 50 years      Current Outpatient Medications:  .  aspirin 81 MG tablet, Take 81 mg by mouth daily., Disp: , Rfl:  .  Biotin 5 MG CAPS, Take by mouth daily., Disp: , Rfl:  .  cholecalciferol (VITAMIN D) 1000 UNITS tablet, Take by mouth., Disp: , Rfl:  .  fexofenadine (ALLEGRA) 180 MG tablet, Take 180 mg by mouth daily., Disp: , Rfl:  .  fluticasone (FLONASE) 50 MCG/ACT nasal spray, SPRAY 2 SPRAYS INTO EACH NOSTRIL EVERY DAY, Disp: 48 g, Rfl: 1 .  losartan-hydrochlorothiazide (HYZAAR) 100-25 MG tablet, Take 1 tablet by mouth daily., Disp: , Rfl:  .  rosuvastatin (CRESTOR) 20 MG tablet, Take 1 tablet (20 mg total) by mouth daily., Disp: 90 tablet, Rfl: 1 .  vitamin B-12 (CYANOCOBALAMIN) 1000 MCG tablet, Take 1 tablet (1,000 mcg total) by mouth daily., Disp: 90 tablet, Rfl: 1  No Known Allergies  I personally reviewed active problem list, medication list, allergies, family history, social history with the patient/caregiver today.   ROS  Constitutional: Negative for fever or weight change.  Respiratory:  Negative for cough and shortness of breath.   Cardiovascular: Negative for chest pain or palpitations.  Gastrointestinal: Negative for abdominal pain, no bowel changes.  Musculoskeletal: Negative for gait problem or joint swelling.  Skin: Negative for rash.  Neurological: Negative for dizziness or headache.  No other specific complaints in a complete review of systems (except as listed in HPI above).  Objective  Vitals:   06/05/19 0850  BP: 120/70  Pulse: 70  Resp: 16  Temp: (!) 96.9 F (36.1 C)  TempSrc: Temporal  SpO2: 97%  Weight: 172 lb 6.4 oz (78.2 kg)  Height: 5\' 5"  (1.651 m)    Body mass index is 28.69 kg/m.  Physical Exam  Constitutional: Patient appears well-developed and well-nourished. Overweight.  No distress.  HEENT: head atraumatic, normocephalic, pupils equal and reactive to light,  neck supple Cardiovascular: Normal rate, regular rhythm and normal heart sounds.  No murmur heard. No BLE edema. Pulmonary/Chest: Effort normal and breath sounds normal. No respiratory distress. Abdominal: Soft.  There is no tenderness. Psychiatric: Patient has a normal mood and affect. behavior is normal. Judgment and thought content normal.  Recent Results (from the past 2160 hour(s))  Anti-parietal antibody     Status: None   Collection Time: 03/20/19  1:05 PM  Result Value Ref Range   Parietal Cell Antibody-IgG 4.0 0.0 - 20.0 Units    Comment: (NOTE)                                Negative    0.0 - 20.0                                Equivocal  20.1 - 24.9                                Positive         >24.9 Parietal Cell Antibodies are found in 90% of patients with pernicious anemia and 30% of first degree relatives with pernicious anemia. Performed At: [facility] [place], [place] [id number] Rush Farmer MD [id number]   Intrinsic Factor Antibodies     Status: None   Collection Time: 03/20/19  1:05 PM  Result Value Ref Range    Intrinsic Factor 1.0 0.0 - 1.1 AU/mL    Comment: (NOTE) Performed At: [facility] [place], [place] [id number] Rush Farmer MD [id number]   Vitamin B12     Status: None   Collection Time: 03/20/19  1:05 PM  Result Value Ref Range   Vitamin B-12 429 180 - 914 pg/mL    Comment: (NOTE) This assay is not validated for testing neonatal or myeloproliferative syndrome specimens for Vitamin B12 levels. Performed at [facility], [address], [place], [place] [address]   Comprehensive metabolic panel     Status: Abnormal   Collection Time: 03/20/19  1:05 PM  Result Value Ref Range   Sodium 138 135 - 145 mmol/L   Potassium 3.9 3.5 - 5.1 mmol/L   Chloride 104 98 - 111 mmol/L   CO2 27 22 - 32 mmol/L   Glucose, Bld 88 70 - 99 mg/dL   BUN 15 8 - 23 mg/dL   Creatinine, Ser 1.38 (H) 0.44 - 1.00 mg/dL   Calcium 9.2 8.9 - 10.3 mg/dL   Total Protein 7.7 6.5 - 8.1 g/dL   Albumin 3.9 3.5 - 5.0 g/dL   AST 26 15 - 41 U/L   ALT 19 0 - 44 U/L   Alkaline Phosphatase 60 38 - 126 U/L   Total Bilirubin 0.6 0.3 - 1.2 mg/dL   GFR calc non Af Amer 38 (L) >60 mL/min   GFR calc Af Amer 44 (L) >60 mL/min   Anion gap 7 5 - 15    Comment: Performed at [facility], [address] [place], [place] [address]  CBC with Differential/Platelet     Status: Abnormal   Collection Time: 03/20/19  1:05 PM  Result Value Ref Range   WBC 6.6 4.0 - 10.5 K/uL  RBC 3.87 3.87 - 5.11 MIL/uL   Hemoglobin 11.9 (L) 12.0 - 15.0 g/dL   HCT 35.6 (L) 36.0 - 46.0 %   MCV 92.0 80.0 - 100.0 fL   MCH 30.7 26.0 - 34.0 pg   MCHC 33.4 30.0 - 36.0 g/dL   RDW 14.1 11.5 - 15.5 %   Platelets 137 (L) 150 - 400 K/uL   nRBC 0.0 0.0 - 0.2 %   Neutrophils Relative % 53 %   Neutro Abs 3.5 1.7 - 7.7 K/uL   Lymphocytes Relative 38 %   Lymphs Abs 2.5 0.7 - 4.0 K/uL   Monocytes Relative 8 %   Monocytes Absolute 0.6 0.1 - 1.0 K/uL   Eosinophils Relative 1 %   Eosinophils Absolute 0.1 0.0 - 0.5 K/uL    Basophils Relative 0 %   Basophils Absolute 0.0 0.0 - 0.1 K/uL   Immature Granulocytes 0 %   Abs Immature Granulocytes 0.01 0.00 - 0.07 K/uL    Comment: Performed at [facility], [address] [place], [place] [address]      PHQ2/9: Depression screen [facility] 2/9 06/05/2019 02/02/2019 12/23/2018 09/01/2018 07/25/2018  Decreased Interest 0 0 0 0 0  Down, Depressed, Hopeless 0 0 0 0 0  PHQ - 2 Score 0 0 0 0 0  Altered sleeping 0 0 - 0 -  Tired, decreased energy 0 0 - 0 -  Change in appetite 0 0 - 0 -  Feeling bad or failure about yourself  0 0 - 0 -  Trouble concentrating 0 0 - 0 -  Moving slowly or fidgety/restless 0 0 - 0 -  Suicidal thoughts 0 0 - 0 -  PHQ-9 Score 0 0 - 0 -  Difficult doing work/chores - - - Not difficult at all -    phq 9 is negative   Fall Risk: Fall Risk  06/05/2019 02/02/2019 12/23/2018 10/24/2018 09/01/2018  Falls in the past year? 0 0 0 0 No  Number falls in past yr: 0 0 0 0 -  Injury with Fall? 0 0 0 0 -  Follow up - - Falls prevention discussed - -     Functional Status Survey: Is the patient deaf or have difficulty hearing?: No Does the patient have difficulty seeing, even when wearing glasses/contacts?: No Does the patient have difficulty concentrating, remembering, or making decisions?: No Does the patient have difficulty walking or climbing stairs?: No Does the patient have difficulty dressing or bathing?: No Does the patient have difficulty doing errands alone such as visiting a doctor's office or shopping?: No    Assessment & Plan  1. Chronic kidney disease, stage III (moderate) (HCC)  - VITAMIN D 25 Hydroxy (Vit-D Deficiency, Fractures) - PTH, Intact and Calcium  2. Atherosclerosis of abdominal aorta (HCC)  Continue statin therapy and recheck labs  3. Secondary hyperparathyroidism ([place])  Recheck labs   4. B12 deficiency  Taking otc supplementation   5. Essential hypertension  Taking medication   6.  Dyslipidemia  - Lipid panel  7. Benign hypertension with chronic kidney disease, stage III ([place])   8. MGUS (monoclonal gammopathy of unknown significance)  Monitored by [name]  9. Thrombocytopathia ([place])  Under the care of [name]   10. Vitamin D deficiency  Recheck today  11. Hyperglycemia  - Hemoglobin A1c

## 2019-06-08 LAB — LIPID PANEL
Cholesterol: 196 mg/dL (ref <200)
HDL: 44 mg/dL — ABNORMAL LOW (ref >=50)
LDL Cholesterol (Calc): 129 mg/dL (calc) — ABNORMAL HIGH
Non-HDL Cholesterol (Calc): 152 mg/dL (calc) — ABNORMAL HIGH (ref <130)
Total CHOL/HDL Ratio: 4.5 (calc) (ref <5.0)
Triglycerides: 118 mg/dL (ref <150)

## 2019-06-08 LAB — HEMOGLOBIN A1C
Hgb A1c MFr Bld: 6.4 % of total Hgb — ABNORMAL HIGH (ref <5.7)
Mean Plasma Glucose: 137 (calc)
eAG (mmol/L): 7.6 (calc)

## 2019-06-08 LAB — VITAMIN D 25 HYDROXY (VIT D DEFICIENCY, FRACTURES): Vit D, 25-Hydroxy: 35 ng/mL (ref 30–100)

## 2019-06-08 LAB — PTH, INTACT AND CALCIUM
Calcium: 9.8 mg/dL (ref 8.6–10.4)
PTH: 41 pg/mL (ref 14–64)

## 2019-07-27 ENCOUNTER — Encounter: Payer: Self-pay | Admitting: Oncology

## 2019-07-27 ENCOUNTER — Other Ambulatory Visit: Payer: Self-pay

## 2019-07-27 ENCOUNTER — Inpatient Hospital Stay: Payer: Medicare Other | Attending: Oncology

## 2019-07-27 ENCOUNTER — Inpatient Hospital Stay: Payer: Medicare Other | Admitting: Oncology

## 2019-07-27 ENCOUNTER — Inpatient Hospital Stay: Payer: Medicare Other

## 2019-07-27 ENCOUNTER — Other Ambulatory Visit: Payer: Self-pay | Admitting: Oncology

## 2019-07-27 VITALS — BP 125/77 | HR 66 | Temp 96.7°F | Resp 18 | Wt 174.2 lb

## 2019-07-27 DIAGNOSIS — D696 Thrombocytopenia, unspecified: Secondary | ICD-10-CM | POA: Diagnosis not present

## 2019-07-27 DIAGNOSIS — R5383 Other fatigue: Secondary | ICD-10-CM | POA: Insufficient documentation

## 2019-07-27 DIAGNOSIS — Z800 Family history of malignant neoplasm of digestive organs: Secondary | ICD-10-CM | POA: Diagnosis not present

## 2019-07-27 DIAGNOSIS — Z7982 Long term (current) use of aspirin: Secondary | ICD-10-CM | POA: Diagnosis not present

## 2019-07-27 DIAGNOSIS — Z8249 Family history of ischemic heart disease and other diseases of the circulatory system: Secondary | ICD-10-CM | POA: Diagnosis not present

## 2019-07-27 DIAGNOSIS — D631 Anemia in chronic kidney disease: Secondary | ICD-10-CM

## 2019-07-27 DIAGNOSIS — Z79899 Other long term (current) drug therapy: Secondary | ICD-10-CM | POA: Insufficient documentation

## 2019-07-27 DIAGNOSIS — I129 Hypertensive chronic kidney disease with stage 1 through stage 4 chronic kidney disease, or unspecified chronic kidney disease: Secondary | ICD-10-CM | POA: Diagnosis not present

## 2019-07-27 DIAGNOSIS — E785 Hyperlipidemia, unspecified: Secondary | ICD-10-CM | POA: Diagnosis not present

## 2019-07-27 DIAGNOSIS — Z8542 Personal history of malignant neoplasm of other parts of uterus: Secondary | ICD-10-CM | POA: Diagnosis not present

## 2019-07-27 DIAGNOSIS — N183 Chronic kidney disease, stage 3 (moderate): Secondary | ICD-10-CM

## 2019-07-27 DIAGNOSIS — E538 Deficiency of other specified B group vitamins: Secondary | ICD-10-CM | POA: Insufficient documentation

## 2019-07-27 LAB — CBC WITH DIFFERENTIAL/PLATELET
Abs Immature Granulocytes: 0.01 10*3/uL (ref 0.00–0.07)
Basophils Absolute: 0 10*3/uL (ref 0.0–0.1)
Basophils Relative: 0 %
Eosinophils Absolute: 0.1 10*3/uL (ref 0.0–0.5)
Eosinophils Relative: 1 %
HCT: 35 % — ABNORMAL LOW (ref 36.0–46.0)
Hemoglobin: 11.4 g/dL — ABNORMAL LOW (ref 12.0–15.0)
Immature Granulocytes: 0 %
Lymphocytes Relative: 36 %
Lymphs Abs: 2.2 10*3/uL (ref 0.7–4.0)
MCH: 30.6 pg (ref 26.0–34.0)
MCHC: 32.6 g/dL (ref 30.0–36.0)
MCV: 93.8 fL (ref 80.0–100.0)
Monocytes Absolute: 0.5 10*3/uL (ref 0.1–1.0)
Monocytes Relative: 8 %
Neutro Abs: 3.4 10*3/uL (ref 1.7–7.7)
Neutrophils Relative %: 55 %
Platelets: 138 10*3/uL — ABNORMAL LOW (ref 150–400)
RBC: 3.73 MIL/uL — ABNORMAL LOW (ref 3.87–5.11)
RDW: 14.1 % (ref 11.5–15.5)
WBC: 6.2 10*3/uL (ref 4.0–10.5)
nRBC: 0 % (ref 0.0–0.2)

## 2019-07-27 LAB — COMPREHENSIVE METABOLIC PANEL
ALT: 19 U/L (ref 0–44)
AST: 24 U/L (ref 15–41)
Albumin: 3.6 g/dL (ref 3.5–5.0)
Alkaline Phosphatase: 58 U/L (ref 38–126)
Anion gap: 9 (ref 5–15)
BUN: 22 mg/dL (ref 8–23)
CO2: 28 mmol/L (ref 22–32)
Calcium: 9.4 mg/dL (ref 8.9–10.3)
Chloride: 101 mmol/L (ref 98–111)
Creatinine, Ser: 1.61 mg/dL — ABNORMAL HIGH (ref 0.44–1.00)
GFR calc Af Amer: 37 mL/min — ABNORMAL LOW (ref >60)
GFR calc non Af Amer: 32 mL/min — ABNORMAL LOW (ref >60)
Glucose, Bld: 171 mg/dL — ABNORMAL HIGH (ref 70–99)
Potassium: 3.9 mmol/L (ref 3.5–5.1)
Sodium: 138 mmol/L (ref 135–145)
Total Bilirubin: 0.5 mg/dL (ref 0.3–1.2)
Total Protein: 7.3 g/dL (ref 6.5–8.1)

## 2019-07-27 LAB — VITAMIN B12: Vitamin B-12: 863 pg/mL (ref 180–914)

## 2019-07-27 LAB — IMMATURE PLATELET FRACTION: Immature Platelet Fraction: 8.7 % — ABNORMAL HIGH (ref 1.2–8.6)

## 2019-07-27 NOTE — Progress Notes (Signed)
Hematology/Oncology Consult note [facility] [phone] [phone]   Patient Care Team: Steele Sizer, MD as PCP - General (Family Medicine)  REFERRING PROVIDER: Dr.Lateef REASON FOR VISIT Follow up for management of abnormal UPEP  HISTORY OF PRESENTING ILLNESS:  Morgan Livingston is a  73 y.o.  female with PMH listed below who was referred to me for evaluation of abnormal UPEP.   She has CKD and follows up with Dr.Lateef. History of HTN. Reports doing well, denies bone pain.  Fatigue:  Chronic onset, perisistent, no aggravating or improving factors, no associated symptoms.  Reviewed labs which was done at [facility] office.  03/05/2018 SPEP done at [facility] [place] showed negative for M Spike UPEP: M spike 16.4  # colonoscopy done on 12 02/05/2016 with findings of polyps which were resected and retrieved.  Pathology showed negative for high-grade dysplasia or malignancy.  + Tubular adenoma   INTERVAL HISTORY Morgan Livingston is a 73 y.o. female who has above history reviewed by me today presents for follow up visit for management of thrombocytopenia and vitamin B12 deficiency, 4 months assessment. Patient reports doing well.  She has no new complaints today Chronic fatigue is at baseline.  Not worse. Due to the COVID pandemic, we had a virtual visit few months ago.  Patient's B12 injections were canceled and the patient was recommended to start oral vitamin B12 supplementation.     Review of Systems  Constitutional: Positive for malaise/fatigue. Negative for chills, fever and weight loss.  HENT: Negative for sore throat.   Eyes: Negative for redness.  Respiratory: Negative for cough, shortness of breath and wheezing.   Cardiovascular: Negative for chest pain, palpitations and leg swelling.  Gastrointestinal: Negative for abdominal pain, blood in stool, heartburn, nausea and vomiting.  Genitourinary: Negative for dysuria.   Musculoskeletal: Negative for myalgias.  Skin: Negative for rash.  Neurological: Negative for dizziness, tingling and tremors. Seizures:    Endo/Heme/Allergies: Does not bruise/bleed easily.  Psychiatric/Behavioral: Negative for hallucinations.    MEDICAL HISTORY:  Past Medical History:  Diagnosis Date  . Allergy   . Cancer [facility])    Uterine Cancer  . Hyperlipidemia   . Hypertension   . Kidney stones 06/21/2018    SURGICAL HISTORY: Past Surgical History:  Procedure Laterality Date  . ABDOMINAL HYSTERECTOMY    . BREAST BIOPSY Right 04/09/2006   negative  . BREAST BIOPSY Left 12/04/2017   fibroadenmatoid change with coarse calcs  . COLONOSCOPY WITH PROPOFOL N/A 10/07/2018   Procedure: COLONOSCOPY WITH PROPOFOL;  Surgeon: [name], MD;  Location: [facility] ENDOSCOPY;  Service: Gastroenterology;  Laterality: N/A;    SOCIAL HISTORY: Social History   Socioeconomic History  . Marital status: Married    Spouse name: Paramedic  . Number of children: 2  . Years of education: some college  . Highest education level: 12th grade  Occupational History  . 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 Financial resource strain: Not hard at all  . Food insecurity    Worry: Never true    Inability: Never true  . Transportation needs    Medical: No    Non-medical: No  Tobacco Use  . Smoking status: Former Smoker    Packs/day: 1.00    Years: 2.00    Pack years: 2.00    Types: Cigarettes    Quit date: 1963    Years since quitting: 57.7  . Smokeless tobacco: Never Used  .  Tobacco comment: smoking cessation materials not required  Substance and Sexual Activity  . Alcohol use: No    Alcohol/week: 0.0 standard drinks  . Drug use: No  . Sexual activity: Yes    Partners: Male    Birth control/protection: Post-menopausal  Lifestyle  . Physical activity    Days per week: 7 days    Minutes per session: 20 min  . Stress: Not at all  Relationships  . Social  connections    Talks on phone: More than three times a week    Gets together: More than three times a week    Attends religious service: More than 4 times per year    Active member of club or organization: Yes    Attends meetings of clubs or organizations: More than 4 times per year    Relationship status: Married  . Intimate partner violence    Fear of current or ex partner: No    Emotionally abused: No    Physically abused: No    Forced sexual activity: No  Other Topics Concern  . Not on file  Social History Narrative   Married for 62 years     FAMILY HISTORY: Family History  Problem Relation Age of Onset  . Congestive Heart Failure Mother 23  . Heart attack Father 9  . Sinusitis Sister   . Stomach cancer Sister   . Uterine cancer Sister   . Liver cancer Sister   . Pancreatic cancer Sister   . Dementia Sister        73  . Breast cancer Neg Hx     ALLERGIES:  has No Known Allergies.  MEDICATIONS:  Current Outpatient Medications  Medication Sig Dispense Refill  . aspirin 81 MG tablet Take 81 mg by mouth daily.    . Biotin 5 MG CAPS Take by mouth daily.    . cholecalciferol (VITAMIN D) 1000 UNITS tablet Take by mouth.    . fexofenadine (ALLEGRA) 180 MG tablet Take 180 mg by mouth daily.    . fluticasone (FLONASE) 50 MCG/ACT nasal spray SPRAY 2 SPRAYS INTO EACH NOSTRIL EVERY DAY 48 g 1  . losartan-hydrochlorothiazide (HYZAAR) 100-25 MG tablet Take 1 tablet by mouth daily.    . rosuvastatin (CRESTOR) 20 MG tablet Take 1 tablet (20 mg total) by mouth daily. 90 tablet 1  . vitamin B-12 (CYANOCOBALAMIN) 1000 MCG tablet Take 1 tablet (1,000 mcg total) by mouth daily. 90 tablet 1   No current facility-administered medications for this visit.      PHYSICAL EXAMINATION: ECOG PERFORMANCE STATUS: 1 - Symptomatic but completely ambulatory Vitals:   07/27/19 1006  BP: 125/77  Pulse: 66  Resp: 18  Temp: (!) 96.7 F (35.9 C)   Filed Weights   07/27/19 1006  Weight:  174 lb 3.2 oz (79 kg)    Physical Exam Constitutional:      General: She is not in acute distress. HENT:     Head: Normocephalic and atraumatic.  Eyes:     General: No scleral icterus.    Conjunctiva/sclera: Conjunctivae normal.     Pupils: Pupils are equal, round, and reactive to light.  Neck:     Musculoskeletal: Normal range of motion and neck supple.  Cardiovascular:     Rate and Rhythm: Normal rate and regular rhythm.     Heart sounds: Normal heart sounds.  Pulmonary:     Effort: Pulmonary effort is normal. No respiratory distress.     Breath sounds: Normal breath sounds.  No wheezing or rales.  Chest:     Chest wall: No tenderness.  Abdominal:     General: Bowel sounds are normal. There is no distension.     Palpations: Abdomen is soft. There is no mass.     Tenderness: There is no abdominal tenderness.  Musculoskeletal: Normal range of motion.        General: No deformity.  Lymphadenopathy:     Cervical: No cervical adenopathy.  Skin:    General: Skin is warm and dry.     Findings: No erythema or rash.  Neurological:     Mental Status: She is alert and oriented to person, place, and time.     Cranial Nerves: No cranial nerve deficit.     Coordination: Coordination normal.  Psychiatric:        Behavior: Behavior normal.        Thought Content: Thought content normal.      LABORATORY DATA:  I have reviewed the data as listed Lab Results  Component Value Date   WBC 6.2 07/27/2019   HGB 11.4 (L) 07/27/2019   HCT 35.0 (L) 07/27/2019   MCV 93.8 07/27/2019   PLT 138 (L) 07/27/2019   Recent Labs    12/22/18 1003 12/25/18 03/20/19 1305 06/05/19 0924 07/27/19 0953  NA 139  --  138  --  138  K 3.9 4.0 3.9  --  3.9  CL 105  --  104  --  101  CO2 27  --  27  --  28  GLUCOSE 112*  --  88  --  171*  BUN 20 20 15   --  22  CREATININE 1.38* 1.3* 1.38*  --  1.61*  CALCIUM 9.5  --  9.2 9.8 9.4  GFRNONAA 38*  --  38*  --  32*  GFRAA 44*  --  44*  --  37*  PROT  7.6  --  7.7  --  7.3  ALBUMIN 3.9  --  3.9  --  3.6  AST 32  --  26  --  24  ALT 19  --  19  --  19  ALKPHOS 62  --  60  --  58  BILITOT 0.5  --  0.6  --  0.5   Iron/TIBC/Ferritin/ %Sat    Component Value Date/Time   IRON 76 05/23/2015 0914   TIBC 276 05/23/2015 0914   FERRITIN 57 05/23/2015 0914   IRONPCTSAT 28 05/23/2015 0914    04/17/2018  SPEP no M spike. Normal serum free light chain ratio. UPEP, no M spike, elevated free kappa/lamda ratio at 20.75.   08/12/2018 UPEP no M spike  ASSESSMENT & PLAN:  1. Thrombocytopenia ([place])   2. B12 deficiency   3. Anemia in stage 3 chronic kidney disease ([place])   Labs are reviewed and discussed with patient. Her hemoglobin 11.4 today, slightly worsen comparing to last visit.  Patient has CKD with worsen of kidney function.  probably anemia secondary to CKD. Recommend pt to avoid nephrotoxins.   Chronic thrombocytopenia, platelet counts are stable.  Patient has had previous work-up done including negative hepatitis, HIV. Immature platelet fraction was increased, consistent with her from construction/destruction.  Possible ITP. With patient that  History of vitamin B12 deficiency, B12 supplementation. Today's vitamin B12 levels are pending.  Discussed with her that if her vitamin B12 levels are stable, and patient will continue with oral vitamin B12 supplementation. Vitamin B12 levels are low despite taking oral supplementation, will switch  patient back to vitamin B12 injections.  Return to clinic for MD assessment in  6 months with repeat CBC, CMP, vitamin B12.  We will also check intrinsic antibody and antiparietal antibodies.  Orders Placed This Encounter  Procedures  . CBC with Differential/Platelet    Standing Status:   Future    Standing Expiration Date:   07/26/2020  . Comprehensive metabolic panel    Standing Status:   Future    Standing Expiration Date:   07/26/2020  . Vitamin B12    Standing Status:   Future    Standing  Expiration Date:   07/26/2020    All questions were answered. The patient knows to call the clinic with any problems questions or concerns.   Earlie Server, MD, PhD Hematology Oncology [facility] at [facility] Pager- [phone] 07/27/2019

## 2019-08-01 ENCOUNTER — Other Ambulatory Visit: Payer: Self-pay | Admitting: Family Medicine

## 2019-08-01 DIAGNOSIS — E785 Hyperlipidemia, unspecified: Secondary | ICD-10-CM

## 2019-08-01 DIAGNOSIS — I10 Essential (primary) hypertension: Secondary | ICD-10-CM

## 2019-08-28 ENCOUNTER — Ambulatory Visit (INDEPENDENT_AMBULATORY_CARE_PROVIDER_SITE_OTHER): Payer: Medicare Other | Admitting: Emergency Medicine

## 2019-08-28 ENCOUNTER — Other Ambulatory Visit: Payer: Self-pay

## 2019-08-28 DIAGNOSIS — Z23 Encounter for immunization: Secondary | ICD-10-CM

## 2019-09-02 ENCOUNTER — Ambulatory Visit (INDEPENDENT_AMBULATORY_CARE_PROVIDER_SITE_OTHER): Payer: Medicare Other | Admitting: Family Medicine

## 2019-09-02 ENCOUNTER — Other Ambulatory Visit: Payer: Self-pay

## 2019-09-02 ENCOUNTER — Encounter: Payer: Self-pay | Admitting: Family Medicine

## 2019-09-02 VITALS — BP 125/85 | HR 70 | Temp 95.9°F | Wt 170.0 lb

## 2019-09-02 DIAGNOSIS — N1832 Chronic kidney disease, stage 3b: Secondary | ICD-10-CM | POA: Diagnosis not present

## 2019-09-02 DIAGNOSIS — Z8619 Personal history of other infectious and parasitic diseases: Secondary | ICD-10-CM | POA: Diagnosis not present

## 2019-09-02 DIAGNOSIS — R112 Nausea with vomiting, unspecified: Secondary | ICD-10-CM | POA: Diagnosis not present

## 2019-09-02 DIAGNOSIS — R1010 Upper abdominal pain, unspecified: Secondary | ICD-10-CM

## 2019-09-02 MED ORDER — GI COCKTAIL ~~LOC~~: 30.0000 mL | Freq: Two times a day (BID) | ORAL | 0 refills | Status: DC

## 2019-09-02 MED ORDER — OMEPRAZOLE 40 MG PO CPDR: 40.0000 mg | DELAYED_RELEASE_CAPSULE | Freq: Two times a day (BID) | ORAL | 0 refills | Status: DC

## 2019-09-02 MED ORDER — ONDANSETRON HCL 4 MG PO TABS: 4.0000 mg | ORAL_TABLET | Freq: Three times a day (TID) | ORAL | 0 refills | Status: DC | PRN

## 2019-09-02 NOTE — Progress Notes (Signed)
Name: Morgan Livingston   MRN: [id number]    DOB: [birth year]   Date:09/02/2019       Progress Note  Subjective  Chief Complaint  Chief Complaint  Patient presents with  . Abdominal Pain    Cramping started Saturday morning. Stomach swells.  . Emesis    Started vomitting on Sunday night. She has been eating light and she is still throwing up. When her stomach starts cramping and she throws up whatever she eats.    I connected with  [name]  on 09/02/19 at  8:40 AM EDT by a video enabled telemedicine application and verified that I am speaking with the correct person using two identifiers.  I discussed the limitations of evaluation and management by telemedicine and the availability of in person appointments. The patient expressed understanding and agreed to proceed. Staff also discussed with the patient that there may be a patient responsible charge related to this service. Patient Location: at home  Provider Location: [facility]    HPI  Abdominal pain: Morgan Livingston states she ate [place] food for lunch on Friday followed by fish from [employer] on Friday evening, she woke up on Saturday am with upper abdominal pain, and some distention. Pain described as sharp and stabbing. Symptoms improved but on Sunday it returned, associated with nausea and vomiting after meals. No fever, chills, change in bowel movements of blood in stools. She is afraid to eat because symptoms gets worse. She vomited after eating soup last night. She has been trying to stay hydrated and has clear urine. She has CKI and explained risk of GFR dropping. She has a history of h. Pylori infection. We will obtain labs, treat her for gastritis but explained importance of going to urgent care or EC if symptoms gets worse, unable to keep fluids down or blood in vomit or stools  Patient Active Problem List   Diagnosis Date Noted  . MGUS (monoclonal gammopathy of unknown significance) 09/01/2018  . Secondary  hyperparathyroidism ([place]) 09/01/2018  . B12 deficiency 08/19/2018  . Atherosclerosis of aorta ([place]) 07/25/2018  . Thrombocytopenia ([place]) 05/09/2018  . Proteinuria 05/09/2018  . Positive ANA (antinuclear antibody) 04/02/2018  . Bilateral dry eyes 04/02/2018  . History of hysterectomy 05/23/2015  . Chronic kidney disease, stage III (moderate) 05/23/2015  . Benign hypertension 05/22/2015  . Chronic constipation 05/22/2015  . DDD (degenerative disc disease), lumbar 05/22/2015  . DD (diverticular disease) 05/22/2015  . Dyslipidemia 05/22/2015  . History of cervical cancer 05/22/2015  . Helicobacter pylori gastrointestinal tract infection 05/22/2015  . Blood glucose elevated 05/22/2015  . Overweight (BMI 25.0-29.9) 05/22/2015  . Perennial allergic rhinitis with seasonal variation 05/22/2015  . Vitamin D deficiency 05/22/2015  . Impingement syndrome of shoulder 01/14/2015    Past Surgical History:  Procedure Laterality Date  . ABDOMINAL HYSTERECTOMY    . BREAST BIOPSY Right 04/09/2006   negative  . BREAST BIOPSY Left 12/04/2017   fibroadenmatoid change with coarse calcs  . COLONOSCOPY WITH PROPOFOL N/A 10/07/2018   Procedure: COLONOSCOPY WITH PROPOFOL;  Surgeon: [name], MD;  Location: [facility] ENDOSCOPY;  Service: Gastroenterology;  Laterality: N/A;    Family History  Problem Relation Age of Onset  . Congestive Heart Failure Mother 34  . Heart attack Father 4  . Sinusitis Sister   . Stomach cancer Sister   . Uterine cancer Sister   . Liver cancer Sister   . Pancreatic cancer Sister   . Dementia Sister  29  . Breast cancer Neg Hx     Social History   Socioeconomic History  . Marital status: Married    Spouse name: Paramedic  . Number of children: 2  . Years of education: some college  . Highest education level: 12th grade  Occupational History  . 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 Financial resource strain: Not hard at all  .  Food insecurity    Worry: Never true    Inability: Never true  . Transportation needs    Medical: No    Non-medical: No  Tobacco Use  . Smoking status: Former Smoker    Packs/day: 1.00    Years: 2.00    Pack years: 2.00    Types: Cigarettes    Quit date: 1963    Years since quitting: 57.8  . Smokeless tobacco: Never Used  . Tobacco comment: smoking cessation materials not required  Substance and Sexual Activity  . Alcohol use: No    Alcohol/week: 0.0 standard drinks  . Drug use: No  . Sexual activity: Yes    Partners: Male    Birth control/protection: Post-menopausal  Lifestyle  . Physical activity    Days per week: 7 days    Minutes per session: 20 min  . Stress: Not at all  Relationships  . Social connections    Talks on phone: More than three times a week    Gets together: More than three times a week    Attends religious service: More than 4 times per year    Active member of club or organization: Yes    Attends meetings of clubs or organizations: More than 4 times per year    Relationship status: Married  . Intimate partner violence    Fear of current or ex partner: No    Emotionally abused: No    Physically abused: No    Forced sexual activity: No  Other Topics Concern  . Not on file  Social History Narrative   Married for 50 years      Current Outpatient Medications:  .  aspirin 81 MG tablet, Take 81 mg by mouth daily., Disp: , Rfl:  .  Biotin 5 MG CAPS, Take by mouth daily., Disp: , Rfl:  .  cholecalciferol (VITAMIN D) 1000 UNITS tablet, Take by mouth., Disp: , Rfl:  .  fexofenadine (ALLEGRA) 180 MG tablet, Take 180 mg by mouth daily., Disp: , Rfl:  .  fluticasone (FLONASE) 50 MCG/ACT nasal spray, SPRAY 2 SPRAYS INTO EACH NOSTRIL EVERY DAY, Disp: 48 g, Rfl: 1 .  losartan-hydrochlorothiazide (HYZAAR) 100-25 MG tablet, Take 1 tablet by mouth daily., Disp: , Rfl:  .  rosuvastatin (CRESTOR) 20 MG tablet, TAKE 1 TABLET BY MOUTH EVERY DAY, Disp: 90 tablet,  Rfl: 1 .  vitamin B-12 (CYANOCOBALAMIN) 1000 MCG tablet, Take 1 tablet (1,000 mcg total) by mouth daily., Disp: 90 tablet, Rfl: 1  No Known Allergies  I personally reviewed active problem list, medication list, allergies, family history, social history, health maintenance with the patient/caregiver today.   ROS  Ten systems reviewed and is negative except as mentioned in HPI   Objective  Virtual encounter, vitals not obtained.  Body mass index is 28.29 kg/m.  Physical Exam  Awake, alert and oriented   PHQ2/9: Depression screen [facility] 2/9 09/02/2019 06/05/2019 02/02/2019 12/23/2018 09/01/2018  Decreased Interest 0 0 0 0 0  Down, Depressed, Hopeless 0 0 0 0 0  PHQ -  2 Score 0 0 0 0 0  Altered sleeping 0 0 0 - 0  Tired, decreased energy 0 0 0 - 0  Change in appetite 0 0 0 - 0  Feeling bad or failure about yourself  0 0 0 - 0  Trouble concentrating 0 0 0 - 0  Moving slowly or fidgety/restless 0 0 0 - 0  Suicidal thoughts 0 0 0 - 0  PHQ-9 Score 0 0 0 - 0  Difficult doing work/chores - - - - Not difficult at all   PHQ-2/9 Result is negative.    Fall Risk: Fall Risk  09/02/2019 06/05/2019 02/02/2019 12/23/2018 10/24/2018  Falls in the past year? 0 0 0 0 0  Number falls in past yr: 0 0 0 0 0  Injury with Fall? 0 0 0 0 0  Follow up - - - Falls prevention discussed -     Assessment & Plan  1. Pain of upper abdomen  - omeprazole (PRILOSEC) 40 MG capsule; Take 1 capsule (40 mg total) by mouth 2 (two) times daily.  Dispense: 60 capsule; Refill: 0 - Alum & Mag Hydroxide-Simeth (GI COCKTAIL) SUSP suspension; Take 30 mLs by mouth 2 (two) times daily. Shake well.  Dispense: 60 mL; Refill: 0 - ondansetron (ZOFRAN) 4 MG tablet; Take 1 tablet (4 mg total) by mouth every 8 (eight) hours as needed for nausea or vomiting.  Dispense: 20 tablet; Refill: 0 - H. pylori breath test - CBC with Differential/Platelet - Comprehensive metabolic panel  2. Nausea and vomiting in adult  -  omeprazole (PRILOSEC) 40 MG capsule; Take 1 capsule (40 mg total) by mouth 2 (two) times daily.  Dispense: 60 capsule; Refill: 0 - Alum & Mag Hydroxide-Simeth (GI COCKTAIL) SUSP suspension; Take 30 mLs by mouth 2 (two) times daily. Shake well.  Dispense: 60 mL; Refill: 0 - ondansetron (ZOFRAN) 4 MG tablet; Take 1 tablet (4 mg total) by mouth every 8 (eight) hours as needed for nausea or vomiting.  Dispense: 20 tablet; Refill: 0 - H. pylori breath test - CBC with Differential/Platelet - Comprehensive metabolic panel  3. History of Helicobacter pylori infection  - omeprazole (PRILOSEC) 40 MG capsule; Take 1 capsule (40 mg total) by mouth 2 (two) times daily.  Dispense: 60 capsule; Refill: 0 - Alum & Mag Hydroxide-Simeth (GI COCKTAIL) SUSP suspension; Take 30 mLs by mouth 2 (two) times daily. Shake well.  Dispense: 60 mL; Refill: 0 - ondansetron (ZOFRAN) 4 MG tablet; Take 1 tablet (4 mg total) by mouth every 8 (eight) hours as needed for nausea or vomiting.  Dispense: 20 tablet; Refill: 0 - H. pylori breath test - CBC with Differential/Platelet - Comprehensive metabolic panel  4. Stage 3b chronic kidney disease  Recheck labs  I discussed the assessment and treatment plan with the patient. The patient was provided an opportunity to ask questions and all were answered. The patient agreed with the plan and demonstrated an understanding of the instructions.  The patient was advised to call back or seek an in-person evaluation if the symptoms worsen or if the condition fails to improve as anticipated.  I provided 25  minutes of non-face-to-face time during this encounter.

## 2019-09-24 ENCOUNTER — Other Ambulatory Visit: Payer: Self-pay | Admitting: Family Medicine

## 2019-09-24 DIAGNOSIS — R1010 Upper abdominal pain, unspecified: Secondary | ICD-10-CM

## 2019-09-24 DIAGNOSIS — Z8619 Personal history of other infectious and parasitic diseases: Secondary | ICD-10-CM

## 2019-09-24 DIAGNOSIS — R112 Nausea with vomiting, unspecified: Secondary | ICD-10-CM

## 2019-09-24 NOTE — Telephone Encounter (Signed)
Requested medication (s) are due for refill today yes  Requested medication (s) are on the active medication list yes  Future visit scheduled yes  Last refill: 09/02/19  Notes to clinic: Patient is requesting RF of medication given for symptoms. Sent for review of extended use.  Requested Prescriptions  Pending Prescriptions Disp Refills   omeprazole (PRILOSEC) 40 MG capsule [Pharmacy Med Name: OMEPRAZOLE DR 40 MG CAPSULE] 60 capsule 0    Sig: TAKE 1 CAPSULE BY MOUTH TWICE A DAY     Gastroenterology: Proton Pump Inhibitors Passed - 09/24/2019  2:32 PM      Passed - Valid encounter within last 12 months    Recent Outpatient Visits          3 weeks ago Pain of upper abdomen   [facility] Steele Sizer, MD   3 months ago Chronic kidney disease, stage III (moderate) [facility])   [facility] Steele Sizer, MD   7 months ago Atherosclerosis of abdominal aorta [facility])   [facility] Steele Sizer, MD   11 months ago Degenerative disc disease, lumbar   [facility] Steele Sizer, MD   1 year ago Degenerative disc disease, lumbar   [facility] Steele Sizer, MD      Future Appointments            In 2 months Ancil Boozer, Drue Stager, MD [facility], [place]   In 3 months  [facility], [facility]              Requested Prescriptions  Pending Prescriptions Disp Refills   omeprazole (PRILOSEC) 40 MG capsule [Pharmacy Med Name: OMEPRAZOLE DR 40 MG CAPSULE] 60 capsule 0    Sig: TAKE 1 CAPSULE BY MOUTH TWICE A DAY     Gastroenterology: Proton Pump Inhibitors Passed - 09/24/2019  2:32 PM      Passed - Valid encounter within last 12 months    Recent Outpatient Visits          3 weeks ago Pain of upper abdomen   [facility] Steele Sizer, MD   3 months ago Chronic kidney disease, stage III (moderate) [facility])   [place] Steele Sizer, MD   7 months ago Atherosclerosis of abdominal aorta [facility])   [facility] Steele Sizer, MD   11 months ago Degenerative disc disease, lumbar   [facility] Steele Sizer, MD   1 year ago Degenerative disc disease, lumbar   [facility] Steele Sizer, MD      Future Appointments            In 2 months Ancil Boozer, Drue Stager, MD [facility], [place]   In 3 months  [facility], [facility]

## 2019-10-05 ENCOUNTER — Other Ambulatory Visit: Payer: Self-pay | Admitting: Family Medicine

## 2019-10-05 DIAGNOSIS — R1010 Upper abdominal pain, unspecified: Secondary | ICD-10-CM

## 2019-10-05 DIAGNOSIS — R112 Nausea with vomiting, unspecified: Secondary | ICD-10-CM

## 2019-10-05 DIAGNOSIS — Z8619 Personal history of other infectious and parasitic diseases: Secondary | ICD-10-CM

## 2019-11-19 ENCOUNTER — Other Ambulatory Visit: Payer: Self-pay | Admitting: Family Medicine

## 2019-11-19 DIAGNOSIS — Z1231 Encounter for screening mammogram for malignant neoplasm of breast: Secondary | ICD-10-CM

## 2019-12-04 ENCOUNTER — Ambulatory Visit: Payer: Medicare Other | Admitting: Family Medicine

## 2019-12-04 ENCOUNTER — Other Ambulatory Visit: Payer: Self-pay

## 2019-12-04 ENCOUNTER — Encounter: Payer: Self-pay | Admitting: Family Medicine

## 2019-12-04 VITALS — BP 132/78 | HR 76 | Temp 98.2°F | Resp 16 | Ht 65.0 in | Wt 171.9 lb

## 2019-12-04 DIAGNOSIS — N2581 Secondary hyperparathyroidism of renal origin: Secondary | ICD-10-CM

## 2019-12-04 DIAGNOSIS — E785 Hyperlipidemia, unspecified: Secondary | ICD-10-CM | POA: Diagnosis not present

## 2019-12-04 DIAGNOSIS — I700 Atherosclerosis of aorta: Secondary | ICD-10-CM | POA: Diagnosis not present

## 2019-12-04 DIAGNOSIS — N1831 Chronic kidney disease, stage 3a: Secondary | ICD-10-CM

## 2019-12-04 DIAGNOSIS — E559 Vitamin D deficiency, unspecified: Secondary | ICD-10-CM

## 2019-12-04 DIAGNOSIS — I10 Essential (primary) hypertension: Secondary | ICD-10-CM

## 2019-12-04 DIAGNOSIS — M5412 Radiculopathy, cervical region: Secondary | ICD-10-CM | POA: Diagnosis not present

## 2019-12-04 DIAGNOSIS — R739 Hyperglycemia, unspecified: Secondary | ICD-10-CM

## 2019-12-04 DIAGNOSIS — D691 Qualitative platelet defects: Secondary | ICD-10-CM

## 2019-12-04 DIAGNOSIS — E538 Deficiency of other specified B group vitamins: Secondary | ICD-10-CM | POA: Diagnosis not present

## 2019-12-04 DIAGNOSIS — D472 Monoclonal gammopathy: Secondary | ICD-10-CM

## 2019-12-04 DIAGNOSIS — I129 Hypertensive chronic kidney disease with stage 1 through stage 4 chronic kidney disease, or unspecified chronic kidney disease: Secondary | ICD-10-CM | POA: Diagnosis not present

## 2019-12-04 DIAGNOSIS — N183 Chronic kidney disease, stage 3 unspecified: Secondary | ICD-10-CM

## 2019-12-04 LAB — POCT GLYCOSYLATED HEMOGLOBIN (HGB A1C): HbA1c, POC (controlled diabetic range): 6.3 % (ref 0.0–7.0)

## 2019-12-04 MED ORDER — ROSUVASTATIN CALCIUM 20 MG PO TABS: 20.0000 mg | ORAL_TABLET | Freq: Every day | ORAL | 1 refills | Status: DC

## 2019-12-04 NOTE — Progress Notes (Signed)
Name: Morgan Livingston   MRN: [id number]    DOB: [birth year]   Date:12/04/2019       Progress Note  Subjective  Chief Complaint  Chief Complaint  Patient presents with  . Medication Refill  . Hypertension  . B12 deficiency /Thrombocytopenia/MUGS  . Atherosclerosis aorta/dyslipidemia:  . Hyperglycemia    HPI  B12 deficiency /Thrombocytopenia/MUGS: under the care of [name], last B12 improved, she was recently switched to oral supplementation..She also has thrombocytopenia and has follow up with [name] in March , she usually gets labs done at her office   Atherosclerosis aorta/dyslipidemia: found on X-ray of lumbar spine, she does not have claudication. She will continue statin and aspirin daily, last LDL was above 100, advised to go up on Crestor to 40 mg but she would like to hold off for now   Proteinuria and CKI stage III: seeing [name], good urine output, drinking lots of fluids, she has secondary hyperparathyroidism,and vitamin D deficiency. She denies cramping, pruritis.Last GFR was stable CKI stage III , reviewed labs done at [name] 09/2019. Pth was 69 ( elevated ) , GFR stable at 46  Positive for Sjogren's Anti SS-A and also ANA: on labs done 03/2018 by [name] , 03/2018 showed negative for M Spike UPEP spike and monitored by  [name]. She denies dry mouth , but states she has a long history of dry eyes and not changed - uses eye drops, or joint problems  HTN: she is back on medication losartan/hctz and bp at home has been in the 120's, denies chest pain, dizziness or  Palpitation.  Hyperglycemia: no family history of diabetes, A1C was up to 6.4% in July and we will recheck it today, she likes to bake. Denies polyphagia, polydipsia or polyuria   Neck pain: she states that before Thanksgiving she fell asleep on her sofa and when she woke up she had right neck pain and some tingling down right arm, since that episode she has noticed some recurrence usually when she  wakes up right side of neck feels weird and also sometimes has to shake her arm, discussed possible DDD and radiculitis, she states symptoms are gradually improving, not happening every day and no weakness.    Patient Active Problem List   Diagnosis Date Noted  . MGUS (monoclonal gammopathy of unknown significance) 09/01/2018  . Secondary hyperparathyroidism ([place]) 09/01/2018  . B12 deficiency 08/19/2018  . Atherosclerosis of aorta ([place]) 07/25/2018  . Thrombocytopenia ([place]) 05/09/2018  . Proteinuria 05/09/2018  . Positive ANA (antinuclear antibody) 04/02/2018  . Bilateral dry eyes 04/02/2018  . History of hysterectomy 05/23/2015  . Chronic kidney disease, stage III (moderate) 05/23/2015  . Benign hypertension 05/22/2015  . Chronic constipation 05/22/2015  . DDD (degenerative disc disease), lumbar 05/22/2015  . DD (diverticular disease) 05/22/2015  . Dyslipidemia 05/22/2015  . History of cervical cancer 05/22/2015  . Helicobacter pylori gastrointestinal tract infection 05/22/2015  . Blood glucose elevated 05/22/2015  . Overweight (BMI 25.0-29.9) 05/22/2015  . Perennial allergic rhinitis with seasonal variation 05/22/2015  . Vitamin D deficiency 05/22/2015  . Impingement syndrome of shoulder 01/14/2015    Past Surgical History:  Procedure Laterality Date  . ABDOMINAL HYSTERECTOMY    . BREAST BIOPSY Right 04/09/2006   negative  . BREAST BIOPSY Left 12/04/2017   fibroadenmatoid change with coarse calcs  . COLONOSCOPY WITH PROPOFOL N/A 10/07/2018   Procedure: COLONOSCOPY WITH PROPOFOL;  Surgeon: [name], MD;  Location: [facility] ENDOSCOPY;  Service: Gastroenterology;  Laterality: N/A;    Family History  Problem Relation Age of Onset  . Congestive Heart Failure Mother 38  . Heart attack Father 46  . Sinusitis Sister   . Stomach cancer Sister   . Uterine cancer Sister   . Liver cancer Sister   . Pancreatic cancer Sister   . Dementia Sister        74  . Breast cancer Neg Hx       Current Outpatient Medications:  .  aspirin 81 MG tablet, Take 81 mg by mouth daily., Disp: , Rfl:  .  Biotin 5 MG CAPS, Take by mouth daily., Disp: , Rfl:  .  cholecalciferol (VITAMIN D) 1000 UNITS tablet, Take by mouth., Disp: , Rfl:  .  fexofenadine (ALLEGRA) 180 MG tablet, Take 180 mg by mouth daily., Disp: , Rfl:  .  losartan-hydrochlorothiazide (HYZAAR) 100-25 MG tablet, Take 1 tablet by mouth daily., Disp: , Rfl:  .  rosuvastatin (CRESTOR) 20 MG tablet, Take 1 tablet (20 mg total) by mouth daily., Disp: 90 tablet, Rfl: 1 .  vitamin B-12 (CYANOCOBALAMIN) 1000 MCG tablet, Take 1 tablet (1,000 mcg total) by mouth daily., Disp: 90 tablet, Rfl: 1 .  fluticasone (FLONASE) 50 MCG/ACT nasal spray, SPRAY 2 SPRAYS INTO EACH NOSTRIL EVERY DAY (Patient not taking: Reported on 12/04/2019), Disp: 48 g, Rfl: 1  No Known Allergies  I personally reviewed active problem list, medication list, allergies, family history, social history with the patient/caregiver today.   ROS  Constitutional: Negative for fever or weight change.  Respiratory: Negative for cough and shortness of breath.   Cardiovascular: Negative for chest pain or palpitations.  Gastrointestinal: Negative for abdominal pain, no bowel changes.  Musculoskeletal: Negative for gait problem or joint swelling.  Skin: Negative for rash.  Neurological: Negative for dizziness or headache.  No other specific complaints in a complete review of systems (except as listed in HPI above).  Objective  Vitals:   12/04/19 0928  BP: 132/78  Pulse: 76  Resp: 16  Temp: 98.2 F (36.8 C)  TempSrc: Temporal  SpO2: 97%  Weight: 171 lb 14.4 oz (78 kg)  Height: 5\' 5"  (1.651 m)    Body mass index is 28.61 kg/m.  Physical Exam  Constitutional: Patient appears well-developed and well-nourished. Overweight.No distress.  HEENT: head atraumatic, normocephalic, pupils equal and reactive to light Cardiovascular: Normal rate, regular rhythm and  normal heart sounds.  No murmur heard. No BLE edema. Pulmonary/Chest: Effort normal and breath sounds normal. No respiratory distress. Abdominal: Soft.  There is no tenderness. Muscular Skeletal: normal rom of neck  Psychiatric: Patient has a normal mood and affect. behavior is normal. Judgment and thought content normal.  PHQ2/9: Depression screen [facility] 2/9 12/04/2019 09/02/2019 06/05/2019 02/02/2019 12/23/2018  Decreased Interest 0 0 0 0 0  Down, Depressed, Hopeless 0 0 0 0 0  PHQ - 2 Score 0 0 0 0 0  Altered sleeping 0 0 0 0 -  Tired, decreased energy 0 0 0 0 -  Change in appetite 0 0 0 0 -  Feeling bad or failure about yourself  0 0 0 0 -  Trouble concentrating 0 0 0 0 -  Moving slowly or fidgety/restless 0 0 0 0 -  Suicidal thoughts 0 0 0 0 -  PHQ-9 Score 0 0 0 0 -  Difficult doing work/chores Not difficult at all - - - -    phq 9 is negative   Fall Risk: Fall Risk  12/04/2019  09/02/2019 06/05/2019 02/02/2019 12/23/2018  Falls in the past year? 0 0 0 0 0  Number falls in past yr: 0 0 0 0 0  Injury with Fall? 0 0 0 0 0  Follow up - - - - Falls prevention discussed     Functional Status Survey: Is the patient deaf or have difficulty hearing?: No Does the patient have difficulty seeing, even when wearing glasses/contacts?: Yes Does the patient have difficulty concentrating, remembering, or making decisions?: No Does the patient have difficulty walking or climbing stairs?: No Does the patient have difficulty dressing or bathing?: No Does the patient have difficulty doing errands alone such as visiting a doctor's office or shopping?: No    Assessment & Plan  1. Atherosclerosis of aorta (HCC)  Discussed going up on dose to 40 mg but she wants to hold off for now  - rosuvastatin (CRESTOR) 20 MG tablet; Take 1 tablet (20 mg total) by mouth daily.  Dispense: 90 tablet; Refill: 1  2. Cervical radiculopathy  Reassurance at this time, return if no resolution or if gets worse  3.  Stage 3a chronic kidney disease  Reviewed last labs done 09/2019 and stable  4. Secondary hyperparathyroidism ([place])  Reviewed labs  5. Dyslipidemia  - rosuvastatin (CRESTOR) 20 MG tablet; Take 1 tablet (20 mg total) by mouth daily.  Dispense: 90 tablet; Refill: 1  6. Benign hypertension with chronic kidney disease, stage III  Keep follow up with [name]   7. B12 deficiency  Currently on oral medication  8. Essential hypertension   9. MGUS (monoclonal gammopathy of unknown significance)   10. Vitamin D deficiency  Explained importance of taking Vitamin D supplementation   11. Thrombocytopathia [facility])  She has an upcoming appointment with [name]  12. Hyperglycemia  - POCT glycosylated hemoglobin (Hb A1C)

## 2019-12-04 NOTE — Patient Instructions (Signed)
Sjogrens positive in 2019

## 2019-12-18 ENCOUNTER — Ambulatory Visit
Admission: RE | Admit: 2019-12-18 | Discharge: 2019-12-18 | Disposition: A | Discharge status: Home / Self Care | Payer: Medicare PPO | Source: Ambulatory Visit | Attending: Family Medicine | Admitting: Family Medicine

## 2019-12-18 DIAGNOSIS — Z1231 Encounter for screening mammogram for malignant neoplasm of breast: Secondary | ICD-10-CM | POA: Diagnosis not present

## 2019-12-29 ENCOUNTER — Ambulatory Visit (INDEPENDENT_AMBULATORY_CARE_PROVIDER_SITE_OTHER): Payer: Medicare PPO

## 2019-12-29 VITALS — BP 133/86 | HR 58 | Temp 96.9°F | Ht 65.0 in | Wt 172.0 lb

## 2019-12-29 DIAGNOSIS — Z780 Asymptomatic menopausal state: Secondary | ICD-10-CM | POA: Diagnosis not present

## 2019-12-29 DIAGNOSIS — Z0000 Encounter for general adult medical examination without abnormal findings: Secondary | ICD-10-CM | POA: Diagnosis not present

## 2019-12-29 NOTE — Progress Notes (Signed)
Subjective:   Morgan Livingston is a 74 y.o. female who presents for Medicare Annual (Subsequent) preventive examination.  Virtual Visit via Telephone Note  I connected with Morgan Livingston on 12/29/19 at  9:20 AM EST by telephone and verified that I am speaking with the correct person using two identifiers.  Medicare Annual Wellness visit completed telephonically due to Covid-19 pandemic.   Location: Patient: home Provider: office   I discussed the limitations, risks, security and privacy concerns of performing an evaluation and management service by telephone and the availability of in person appointments. The patient expressed understanding and agreed to proceed.  Some vital signs may be absent or patient reported.   Morgan Marker, LPN    Review of Systems:   Cardiac Risk Factors include: advanced age (>25men, >36 women);dyslipidemia;hypertension     Objective:     Vitals: BP 133/86   Pulse (!) 58   Temp (!) 96.9 F (36.1 C)   Ht 5\' 5"  (1.651 m)   Wt 172 lb (78 kg)   BMI 28.62 kg/m   Body mass index is 28.62 kg/m.  Advanced Directives 12/29/2019 03/23/2019 12/23/2018 12/22/2018 10/07/2018 09/30/2018 08/15/2018  Does Patient Have a Medical Advance Directive? No No No No No No No  Would patient like information on creating a medical advance directive? No - Patient declined - No - Patient declined No - Patient declined - - -    Tobacco Social History   Tobacco Use  Smoking Status Former Smoker  . Packs/day: 1.00  . Years: 2.00  . Pack years: 2.00  . Types: Cigarettes  . Quit date: 1963  . Years since quitting: 58.1  Smokeless Tobacco Never Used  Tobacco Comment   smoking cessation materials not required     Counseling given: Not Answered Comment: smoking cessation materials not required   Clinical Intake:  Pre-visit preparation completed: Yes  Pain : No/denies pain     BMI - recorded: 28.62 Nutritional Status: BMI 25 -29 Overweight Nutritional Risks:  None Diabetes: No  How often do you need to have someone help you when you read instructions, pamphlets, or other written materials from your doctor or pharmacy?: 1 - Never  Interpreter Needed?: No  Information entered by :: Morgan Marker LPN  Past Medical History:  Diagnosis Date  . Allergy   . Cancer [facility])    Uterine Cancer  . Hyperlipidemia   . Hypertension   . Kidney stones 06/21/2018   Past Surgical History:  Procedure Laterality Date  . ABDOMINAL HYSTERECTOMY    . BREAST BIOPSY Right 04/09/2006   negative  . BREAST BIOPSY Left 12/04/2017   fibroadenmatoid change with coarse calcs  . COLONOSCOPY WITH PROPOFOL N/A 10/07/2018   Procedure: COLONOSCOPY WITH PROPOFOL;  Surgeon: Morgan Bellows, MD;  Location: [facility] ENDOSCOPY;  Service: Gastroenterology;  Laterality: N/A;   Family History  Problem Relation Age of Onset  . Congestive Heart Failure Mother 28  . Heart attack Father 44  . Sinusitis Sister   . Stomach cancer Sister   . Uterine cancer Sister   . Liver cancer Sister   . Pancreatic cancer Sister   . Dementia Sister        59  . Breast cancer Neg Hx    Social History   Socioeconomic History  . Marital status: Married    Spouse name: Morgan Livingston  . Number of children: 2  . Years of education: some college  . Highest education level: 12th grade  Occupational  History  . 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 Smoking status: Former Smoker    Packs/day: 1.00    Years: 2.00    Pack years: 2.00    Types: Cigarettes    Quit date: 1963    Years since quitting: 58.1  . Smokeless tobacco: Never Used  . Tobacco comment: smoking cessation materials not required  Substance and Sexual Activity  . Alcohol use: No    Alcohol/week: 0.0 standard drinks  . Drug use: No  . Sexual activity: Yes    Partners: Male    Birth control/protection: Post-menopausal  Other Topics Concern  . Not on file  Social History Narrative   Married for 50 years      Social Determinants of Health   Financial Resource Strain: Low Risk   . Difficulty of Paying Living Expenses: Not hard at all  Food Insecurity: No Food Insecurity  . Worried About Charity fundraiser in the Last Year: Never true  . Ran Out of Food in the Last Year: Never true  Transportation Needs: No Transportation Needs  . Lack of Transportation (Medical): No  . Lack of Transportation (Non-Medical): No  Physical Activity: Insufficiently Active  . Days of Exercise per Week: 7 days  . Minutes of Exercise per Session: 20 min  Stress: No Stress Concern Present  . Feeling of Stress : Not at all  Social Connections: Not Isolated  . Frequency of Communication with Friends and Family: More than three times a week  . Frequency of Social Gatherings with Friends and Family: More than three times a week  . Attends Religious Services: More than 4 times per year  . Active Member of Clubs or Organizations: Yes  . Attends Archivist Meetings: More than 4 times per year  . Marital Status: Married    Outpatient Encounter Medications as of 12/29/2019  Medication Sig  . aspirin 81 MG tablet Take 81 mg by mouth daily.  . Biotin 5 MG CAPS Take by mouth daily.  . cholecalciferol (VITAMIN D) 1000 UNITS tablet Take by mouth.  . fexofenadine (ALLEGRA) 180 MG tablet Take 180 mg by mouth daily.  Marland Kitchen losartan-hydrochlorothiazide (HYZAAR) 100-25 MG tablet Take 1 tablet by mouth daily.  . rosuvastatin (CRESTOR) 20 MG tablet Take 1 tablet (20 mg total) by mouth daily.  . vitamin B-12 (CYANOCOBALAMIN) 1000 MCG tablet Take 1 tablet (1,000 mcg total) by mouth daily.  . fluticasone (FLONASE) 50 MCG/ACT nasal spray SPRAY 2 SPRAYS INTO EACH NOSTRIL EVERY DAY (Patient not taking: Reported on 12/04/2019)   No facility-administered encounter medications on file as of 12/29/2019.    Activities of Daily Living In your present state of health, do you have any difficulty performing the following activities:  12/29/2019 12/04/2019  Hearing? N N  Comment declines hearing aids -  Vision? Y Y  Difficulty concentrating or making decisions? N N  Walking or climbing stairs? N N  Dressing or bathing? N N  Doing errands, shopping? N N  Preparing Food and eating ? N -  Using the Toilet? N -  In the past six months, have you accidently leaked urine? N -  Do you have problems with loss of bowel control? N -  Managing your Medications? N -  Managing your Finances? N -  Housekeeping or managing your Housekeeping? N -  Some recent data might be hidden    Patient Care Team: Steele Sizer, MD as PCP -  General (Family Medicine) Earlie Server, MD as Consulting Physician (Oncology) Anthonette Legato, MD (Nephrology) Yolonda Kida, MD as Consulting Physician (Cardiology)    Assessment:   This is a routine wellness examination for [place].  Exercise Activities and Dietary recommendations Current Exercise Habits: Home exercise routine, Type of exercise: walking, Time (Minutes): 20, Frequency (Times/Week): 7, Weekly Exercise (Minutes/Week): 140, Intensity: Mild, Exercise limited by: None identified  Goals    . DIET - INCREASE WATER INTAKE     Recommend to drink at least 6-8 8oz glasses of water per day.    . Weight (lb) < 165 lb (74.8 kg)     Pt states she would like to lose 8 lbs over the next few months       Fall Risk Fall Risk  12/29/2019 12/04/2019 09/02/2019 06/05/2019 02/02/2019  Falls in the past year? 0 0 0 0 0  Number falls in past yr: 0 0 0 0 0  Injury with Fall? 0 0 0 0 0  Risk for fall due to : No Fall Risks - - - -  Follow up Falls prevention discussed - - - -   FALL RISK PREVENTION PERTAINING TO THE HOME:  Any stairs in or around the home? Yes  If so, do they handrails? Yes   Home free of loose throw rugs in walkways, pet beds, electrical cords, etc? Yes  Adequate lighting in your home to reduce risk of falls? Yes   ASSISTIVE DEVICES UTILIZED TO PREVENT FALLS:  Life alert? No    Use of a cane, walker or w/c? No  Grab bars in the bathroom? No  Shower chair or bench in shower? No  Elevated toilet seat or a handicapped toilet? No   DME ORDERS:  DME order needed?  No   TIMED UP AND GO:  Was the test performed? No . Telephonic visit.   Education: Fall risk prevention has been discussed.  Intervention(s) required? No   Depression Screen PHQ 2/9 Scores 12/29/2019 12/04/2019 09/02/2019 06/05/2019  PHQ - 2 Score 0 0 0 0  PHQ- 9 Score - 0 0 0     Cognitive Function     6CIT Screen 12/29/2019 12/23/2018 12/20/2017  What Year? 0 points 0 points 0 points  What month? 0 points 0 points 0 points  What time? 0 points 0 points 0 points  Count back from 20 0 points 0 points 0 points  Months in reverse 0 points 0 points 0 points  Repeat phrase 2 points 0 points 0 points  Total Score 2 0 0    Immunization History  Administered Date(s) Administered  . Fluad Quad(high Dose 65+) 08/28/2019  . Influenza, High Dose Seasonal PF 09/23/2015, 08/22/2016, 08/14/2017, 07/25/2018  . Influenza-Unspecified 09/20/2014  . Moderna SARS-COVID-2 Vaccination 12/16/2019  . Pneumococcal Conjugate-13 01/12/2014  . Pneumococcal Polysaccharide-23 12/28/2014  . Tdap 05/14/2017  . Tetanus 02/25/2007  . Zoster 08/11/2012    Qualifies for Shingles Vaccine? Yes  Zostavax completed 2013. Due for Shingrix. Education has been provided regarding the importance of this vaccine. Pt has been advised to call insurance company to determine out of pocket expense. Advised may also receive vaccine at local pharmacy or Health Dept. Verbalized acceptance and understanding.  Tdap: Up to date  Flu Vaccine: Up to date  Pneumococcal Vaccine: Up to date    Screening Tests Health Maintenance  Topic Date Due  . MAMMOGRAM  12/17/2020  . COLONOSCOPY  10/07/2021  . TETANUS/TDAP  05/15/2027  . INFLUENZA VACCINE  Completed  . DEXA SCAN  Completed  . Hepatitis C Screening  Completed  . PNA vac Low Risk  Adult  Completed    Cancer Screenings:  Colorectal Screening: Completed 10/07/18. Repeat every 3 years;   Mammogram: Completed 12/18/19. Repeat every year;   Bone Density: Completed 2011. Results reflect NORMAL. Repeat every 2 years. Ordered today. Pt provided with contact information and advised to call to schedule appt.   Lung Cancer Screening: (Low Dose CT Chest recommended if Age 3-80 years, 30 pack-year currently smoking OR have quit w/in 15years.) does not qualify.   Additional Screening:  Hepatitis C Screening: does qualify; Completed 08/15/18  Vision Screening: Recommended annual ophthalmology exams for early detection of glaucoma and other disorders of the eye. Is the patient up to date with their annual eye exam?  Yes  Who is the provider or what is the name of the office in which the pt attends annual eye exams? [place] Screening: Recommended annual dental exams for proper oral hygiene  Community Resource Referral:  CRR required this visit?  No      Plan:     I have personally reviewed and addressed the Medicare Annual Wellness questionnaire and have noted the following in the patient's chart:  A. Medical and social history B. Use of alcohol, tobacco or illicit drugs  C. Current medications and supplements D. Functional ability and status E.  Nutritional status F.  Physical activity G. Advance directives H. List of other physicians I.  Hospitalizations, surgeries, and ER visits in previous 12 months J.  [place] such as hearing and vision if needed, cognitive and depression L. Referrals and appointments   In addition, I have reviewed and discussed with patient certain preventive protocols, quality metrics, and best practice recommendations. A written personalized care plan for preventive services as well as general preventive health recommendations were provided to patient.   Signed,  Morgan Marker, LPN Nurse Health  Advisor   Nurse Notes: none

## 2019-12-29 NOTE — Patient Instructions (Signed)
[name] , Thank you for taking time to come for your Medicare Wellness Visit. I appreciate your ongoing commitment to your health goals. Please review the following plan we discussed and let me know if I can assist you in the future.   Screening recommendations/referrals: Colonoscopy: done 10/07/18. Repeat in 2022 Mammogram: done 12/18/19 Bone Density: done 2011. Please call [phone] to schedule your bone density screening.  Recommended yearly ophthalmology/optometry visit for glaucoma screening and checkup Recommended yearly dental visit for hygiene and checkup  Vaccinations: Influenza vaccine: done 08/28/19 Pneumococcal vaccine: done 12/28/14 Tdap vaccine: done 05/14/17 Shingles vaccine: Shingrix discussed. Please contact your pharmacy for coverage information.   Advanced directives: Please bring a copy of your health care power of attorney and living will to the office at your convenience once you have completed those documents.  Conditions/risks identified: Recommend drinking 6-8 glasses of water per day.   Next appointment: Please follow up in one year for your Medicare Annual Wellness visit.     Preventive Care 55 Years and Older, Female Preventive care refers to lifestyle choices and visits with your health care provider that can promote health and wellness. What does preventive care include?  A yearly physical exam. This is also called an annual well check.  Dental exams once or twice a year.  Routine eye exams. Ask your health care provider how often you should have your eyes checked.  Personal lifestyle choices, including:  Daily care of your teeth and gums.  Regular physical activity.  Eating a healthy diet.  Avoiding tobacco and drug use.  Limiting alcohol use.  Practicing safe sex.  Taking low-dose aspirin every day.  Taking vitamin and mineral supplements as recommended by your health care provider. What happens during an annual well check? The  services and screenings done by your health care provider during your annual well check will depend on your age, overall health, lifestyle risk factors, and family history of disease. Counseling  Your health care provider may ask you questions about your:  Alcohol use.  Tobacco use.  Drug use.  Emotional well-being.  Home and relationship well-being.  Sexual activity.  Eating habits.  History of falls.  Memory and ability to understand (cognition).  Work and work Statistician.  Reproductive health. Screening  You may have the following tests or measurements:  Height, weight, and BMI.  Blood pressure.  Lipid and cholesterol levels. These may be checked every 5 years, or more frequently if you are over 58 years old.  Skin check.  Lung cancer screening. You may have this screening every year starting at age 46 if you have a 30-pack-year history of smoking and currently smoke or have quit within the past 15 years.  Fecal occult blood test (FOBT) of the stool. You may have this test every year starting at age 8.  Flexible sigmoidoscopy or colonoscopy. You may have a sigmoidoscopy every 5 years or a colonoscopy every 10 years starting at age 47.  Hepatitis C blood test.  Hepatitis B blood test.  Sexually transmitted disease (STD) testing.  Diabetes screening. This is done by checking your blood sugar (glucose) after you have not eaten for a while (fasting). You may have this done every 1-3 years.  Bone density scan. This is done to screen for osteoporosis. You may have this done starting at age 63.  Mammogram. This may be done every 1-2 years. Talk to your health care provider about how often you should have regular mammograms. Talk with your  health care provider about your test results, treatment options, and if necessary, the need for more tests. Vaccines  Your health care provider may recommend certain vaccines, such as:  Influenza vaccine. This is recommended  every year.  Tetanus, diphtheria, and acellular pertussis (Tdap, Td) vaccine. You may need a Td booster every 10 years.  Zoster vaccine. You may need this after age 40.  Pneumococcal 13-valent conjugate (PCV13) vaccine. One dose is recommended after age 61.  Pneumococcal polysaccharide (PPSV23) vaccine. One dose is recommended after age 14. Talk to your health care provider about which screenings and vaccines you need and how often you need them. This information is not intended to replace advice given to you by your health care provider. Make sure you discuss any questions you have with your health care provider. Document Released: 11/18/2015 Document Revised: 07/11/2016 Document Reviewed: 08/23/2015 Elsevier Interactive Patient Education  2017 [place] Prevention in the Home Falls can cause injuries. They can happen to people of all ages. There are many things you can do to make your home safe and to help prevent falls. What can I do on the outside of my home?  Regularly fix the edges of walkways and driveways and fix any cracks.  Remove anything that might make you trip as you walk through a door, such as a raised step or threshold.  Trim any bushes or trees on the path to your home.  Use bright outdoor lighting.  Clear any walking paths of anything that might make someone trip, such as rocks or tools.  Regularly check to see if handrails are loose or broken. Make sure that both sides of any steps have handrails.  Any raised decks and porches should have guardrails on the edges.  Have any leaves, snow, or ice cleared regularly.  Use sand or salt on walking paths during winter.  Clean up any spills in your garage right away. This includes oil or grease spills. What can I do in the bathroom?  Use night lights.  Install grab bars by the toilet and in the tub and shower. Do not use towel bars as grab bars.  Use non-skid mats or decals in the tub or shower.  If  you need to sit down in the shower, use a plastic, non-slip stool.  Keep the floor dry. Clean up any water that spills on the floor as soon as it happens.  Remove soap buildup in the tub or shower regularly.  Attach bath mats securely with double-sided non-slip rug tape.  Do not have throw rugs and other things on the floor that can make you trip. What can I do in the bedroom?  Use night lights.  Make sure that you have a light by your bed that is easy to reach.  Do not use any sheets or blankets that are too big for your bed. They should not hang down onto the floor.  Have a firm chair that has side arms. You can use this for support while you get dressed.  Do not have throw rugs and other things on the floor that can make you trip. What can I do in the kitchen?  Clean up any spills right away.  Avoid walking on wet floors.  Keep items that you use a lot in easy-to-reach places.  If you need to reach something above you, use a strong step stool that has a grab bar.  Keep electrical cords out of the way.  Do not use  floor polish or wax that makes floors slippery. If you must use wax, use non-skid floor wax.  Do not have throw rugs and other things on the floor that can make you trip. What can I do with my stairs?  Do not leave any items on the stairs.  Make sure that there are handrails on both sides of the stairs and use them. Fix handrails that are broken or loose. Make sure that handrails are as long as the stairways.  Check any carpeting to make sure that it is firmly attached to the stairs. Fix any carpet that is loose or worn.  Avoid having throw rugs at the top or bottom of the stairs. If you do have throw rugs, attach them to the floor with carpet tape.  Make sure that you have a light switch at the top of the stairs and the bottom of the stairs. If you do not have them, ask someone to add them for you. What else can I do to help prevent falls?  Wear shoes  that:  Do not have high heels.  Have rubber bottoms.  Are comfortable and fit you well.  Are closed at the toe. Do not wear sandals.  If you use a stepladder:  Make sure that it is fully opened. Do not climb a closed stepladder.  Make sure that both sides of the stepladder are locked into place.  Ask someone to hold it for you, if possible.  Clearly mark and make sure that you can see:  Any grab bars or handrails.  First and last steps.  Where the edge of each step is.  Use tools that help you move around (mobility aids) if they are needed. These include:  Canes.  Walkers.  Scooters.  Crutches.  Turn on the lights when you go into a dark area. Replace any light bulbs as soon as they burn out.  Set up your furniture so you have a clear path. Avoid moving your furniture around.  If any of your floors are uneven, fix them.  If there are any pets around you, be aware of where they are.  Review your medicines with your doctor. Some medicines can make you feel dizzy. This can increase your chance of falling. Ask your doctor what other things that you can do to help prevent falls. This information is not intended to replace advice given to you by your health care provider. Make sure you discuss any questions you have with your health care provider. Document Released: 08/18/2009 Document Revised: 03/29/2016 Document Reviewed: 11/26/2014 Elsevier Interactive Patient Education  2017 [employer].

## 2020-01-11 ENCOUNTER — Ambulatory Visit
Admission: RE | Admit: 2020-01-11 | Discharge: 2020-01-11 | Disposition: A | Discharge status: Home / Self Care | Payer: Medicare PPO | Source: Ambulatory Visit | Attending: Family Medicine | Admitting: Family Medicine

## 2020-01-11 DIAGNOSIS — Z780 Asymptomatic menopausal state: Secondary | ICD-10-CM | POA: Diagnosis not present

## 2020-01-11 DIAGNOSIS — Z13820 Encounter for screening for osteoporosis: Secondary | ICD-10-CM | POA: Diagnosis not present

## 2020-01-11 DIAGNOSIS — E2839 Other primary ovarian failure: Secondary | ICD-10-CM | POA: Diagnosis not present

## 2020-01-22 DIAGNOSIS — I10 Essential (primary) hypertension: Secondary | ICD-10-CM | POA: Diagnosis not present

## 2020-01-22 DIAGNOSIS — N2581 Secondary hyperparathyroidism of renal origin: Secondary | ICD-10-CM | POA: Diagnosis not present

## 2020-01-22 DIAGNOSIS — R809 Proteinuria, unspecified: Secondary | ICD-10-CM | POA: Diagnosis not present

## 2020-01-22 DIAGNOSIS — N1832 Chronic kidney disease, stage 3b: Secondary | ICD-10-CM | POA: Diagnosis not present

## 2020-01-25 ENCOUNTER — Ambulatory Visit: Payer: Medicare Other

## 2020-01-25 ENCOUNTER — Inpatient Hospital Stay: Payer: Medicare PPO | Attending: Oncology

## 2020-01-25 ENCOUNTER — Other Ambulatory Visit: Payer: Self-pay

## 2020-01-25 DIAGNOSIS — Z79899 Other long term (current) drug therapy: Secondary | ICD-10-CM | POA: Diagnosis not present

## 2020-01-25 DIAGNOSIS — Z8049 Family history of malignant neoplasm of other genital organs: Secondary | ICD-10-CM | POA: Insufficient documentation

## 2020-01-25 DIAGNOSIS — Z800 Family history of malignant neoplasm of digestive organs: Secondary | ICD-10-CM | POA: Insufficient documentation

## 2020-01-25 DIAGNOSIS — Z8542 Personal history of malignant neoplasm of other parts of uterus: Secondary | ICD-10-CM | POA: Insufficient documentation

## 2020-01-25 DIAGNOSIS — E538 Deficiency of other specified B group vitamins: Secondary | ICD-10-CM | POA: Insufficient documentation

## 2020-01-25 DIAGNOSIS — Z90710 Acquired absence of both cervix and uterus: Secondary | ICD-10-CM | POA: Diagnosis not present

## 2020-01-25 DIAGNOSIS — Z8249 Family history of ischemic heart disease and other diseases of the circulatory system: Secondary | ICD-10-CM | POA: Insufficient documentation

## 2020-01-25 DIAGNOSIS — D696 Thrombocytopenia, unspecified: Secondary | ICD-10-CM | POA: Diagnosis not present

## 2020-01-25 DIAGNOSIS — Z7982 Long term (current) use of aspirin: Secondary | ICD-10-CM | POA: Diagnosis not present

## 2020-01-25 DIAGNOSIS — N1832 Chronic kidney disease, stage 3b: Secondary | ICD-10-CM | POA: Insufficient documentation

## 2020-01-25 DIAGNOSIS — I10 Essential (primary) hypertension: Secondary | ICD-10-CM | POA: Diagnosis not present

## 2020-01-25 DIAGNOSIS — Z87891 Personal history of nicotine dependence: Secondary | ICD-10-CM | POA: Diagnosis not present

## 2020-01-25 DIAGNOSIS — D631 Anemia in chronic kidney disease: Secondary | ICD-10-CM

## 2020-01-25 DIAGNOSIS — E785 Hyperlipidemia, unspecified: Secondary | ICD-10-CM | POA: Insufficient documentation

## 2020-01-25 DIAGNOSIS — N183 Chronic kidney disease, stage 3 unspecified: Secondary | ICD-10-CM

## 2020-01-25 LAB — CBC WITH DIFFERENTIAL/PLATELET
Abs Immature Granulocytes: 0.01 10*3/uL (ref 0.00–0.07)
Basophils Absolute: 0 10*3/uL (ref 0.0–0.1)
Basophils Relative: 0 %
Eosinophils Absolute: 0.1 10*3/uL (ref 0.0–0.5)
Eosinophils Relative: 1 %
HCT: 35.7 % — ABNORMAL LOW (ref 36.0–46.0)
Hemoglobin: 11.8 g/dL — ABNORMAL LOW (ref 12.0–15.0)
Immature Granulocytes: 0 %
Lymphocytes Relative: 43 %
Lymphs Abs: 2.8 10*3/uL (ref 0.7–4.0)
MCH: 30.8 pg (ref 26.0–34.0)
MCHC: 33.1 g/dL (ref 30.0–36.0)
MCV: 93.2 fL (ref 80.0–100.0)
Monocytes Absolute: 0.6 10*3/uL (ref 0.1–1.0)
Monocytes Relative: 10 %
Neutro Abs: 3 10*3/uL (ref 1.7–7.7)
Neutrophils Relative %: 46 %
Platelets: 142 10*3/uL — ABNORMAL LOW (ref 150–400)
RBC: 3.83 MIL/uL — ABNORMAL LOW (ref 3.87–5.11)
RDW: 13.8 % (ref 11.5–15.5)
WBC: 6.6 10*3/uL (ref 4.0–10.5)
nRBC: 0 % (ref 0.0–0.2)

## 2020-01-25 LAB — COMPREHENSIVE METABOLIC PANEL
ALT: 19 U/L (ref 0–44)
AST: 37 U/L (ref 15–41)
Albumin: 3.8 g/dL (ref 3.5–5.0)
Alkaline Phosphatase: 58 U/L (ref 38–126)
Anion gap: 8 (ref 5–15)
BUN: 21 mg/dL (ref 8–23)
CO2: 27 mmol/L (ref 22–32)
Calcium: 9.7 mg/dL (ref 8.9–10.3)
Chloride: 103 mmol/L (ref 98–111)
Creatinine, Ser: 1.6 mg/dL — ABNORMAL HIGH (ref 0.44–1.00)
GFR calc Af Amer: 37 mL/min — ABNORMAL LOW (ref >60)
GFR calc non Af Amer: 32 mL/min — ABNORMAL LOW (ref >60)
Glucose, Bld: 108 mg/dL — ABNORMAL HIGH (ref 70–99)
Potassium: 3.9 mmol/L (ref 3.5–5.1)
Sodium: 138 mmol/L (ref 135–145)
Total Bilirubin: 0.7 mg/dL (ref 0.3–1.2)
Total Protein: 7.5 g/dL (ref 6.5–8.1)

## 2020-01-25 LAB — RETIC PANEL
Immature Retic Fract: 9.7 % (ref 2.3–15.9)
RBC.: 3.77 MIL/uL — ABNORMAL LOW (ref 3.87–5.11)
Retic Count, Absolute: 42.2 10*3/uL (ref 19.0–186.0)
Retic Ct Pct: 1.1 % (ref 0.4–3.1)
Reticulocyte Hemoglobin: 34.5 pg (ref >27.9)

## 2020-01-25 LAB — VITAMIN B12: Vitamin B-12: 1881 pg/mL — ABNORMAL HIGH (ref 180–914)

## 2020-01-27 ENCOUNTER — Inpatient Hospital Stay: Payer: Medicare PPO | Admitting: Oncology

## 2020-01-27 ENCOUNTER — Encounter: Payer: Self-pay | Admitting: Oncology

## 2020-01-27 ENCOUNTER — Inpatient Hospital Stay: Payer: Medicare PPO

## 2020-01-27 VITALS — BP 125/81 | HR 59 | Temp 96.2°F | Resp 16 | Wt 174.4 lb

## 2020-01-27 DIAGNOSIS — E538 Deficiency of other specified B group vitamins: Secondary | ICD-10-CM

## 2020-01-27 DIAGNOSIS — Z7982 Long term (current) use of aspirin: Secondary | ICD-10-CM | POA: Diagnosis not present

## 2020-01-27 DIAGNOSIS — I10 Essential (primary) hypertension: Secondary | ICD-10-CM | POA: Diagnosis not present

## 2020-01-27 DIAGNOSIS — Z87891 Personal history of nicotine dependence: Secondary | ICD-10-CM | POA: Diagnosis not present

## 2020-01-27 DIAGNOSIS — D631 Anemia in chronic kidney disease: Secondary | ICD-10-CM | POA: Diagnosis not present

## 2020-01-27 DIAGNOSIS — Z8542 Personal history of malignant neoplasm of other parts of uterus: Secondary | ICD-10-CM | POA: Diagnosis not present

## 2020-01-27 DIAGNOSIS — N1832 Chronic kidney disease, stage 3b: Secondary | ICD-10-CM

## 2020-01-27 DIAGNOSIS — D696 Thrombocytopenia, unspecified: Secondary | ICD-10-CM | POA: Diagnosis not present

## 2020-01-27 DIAGNOSIS — E785 Hyperlipidemia, unspecified: Secondary | ICD-10-CM | POA: Diagnosis not present

## 2020-01-27 NOTE — Progress Notes (Signed)
Hematology/Oncology Consult note [facility] Telephone:([phone] [phone]   Patient Care Team: Steele Sizer, MD as PCP - General (Family Medicine) Earlie Server, MD as Consulting Physician (Oncology) Anthonette Legato, MD (Nephrology) Yolonda Kida, MD as Consulting Physician (Cardiology)  REFERRING PROVIDER: Dr.Lateef REASON FOR VISIT Follow up for management of abnormal UPEP  HISTORY OF PRESENTING ILLNESS:  Morgan Livingston is a  74 y.o.  female with PMH listed below who was referred to me for evaluation of abnormal UPEP.   She has CKD and follows up with Dr.Lateef. History of HTN. Reports doing well, denies bone pain.  Fatigue:  Chronic onset, perisistent, no aggravating or improving factors, no associated symptoms.  Reviewed labs which was done at [facility] office.  03/05/2018 SPEP done at [place] showed negative for M Spike UPEP: M spike 16.4  # colonoscopy done on 12 02/05/2016 with findings of polyps which were resected and retrieved.  Pathology showed negative for high-grade dysplasia or malignancy.  + Tubular adenoma   INTERVAL HISTORY Morgan Livingston is a 74 y.o. female who has above history reviewed by me today presents for follow up visit for management of thrombocytopenia and vitamin B12 deficiency, 4 months assessment. Patient has no complaints today.    Review of Systems  Constitutional: Positive for malaise/fatigue. Negative for chills, fever and weight loss.  HENT: Negative for sore throat.   Eyes: Negative for redness.  Respiratory: Negative for cough, shortness of breath and wheezing.   Cardiovascular: Negative for chest pain, palpitations and leg swelling.  Gastrointestinal: Negative for abdominal pain, blood in stool, heartburn, nausea and vomiting.  Genitourinary: Negative for dysuria.  Musculoskeletal: Negative for myalgias.  Skin: Negative for rash.  Neurological: Negative for dizziness,  tingling and tremors. Seizures:    Endo/Heme/Allergies: Does not bruise/bleed easily.  Psychiatric/Behavioral: Negative for hallucinations.    MEDICAL HISTORY:  Past Medical History:  Diagnosis Date  . Allergy   . Cancer [facility])    Uterine Cancer  . Hyperlipidemia   . Hypertension   . Kidney stones 06/21/2018    SURGICAL HISTORY: Past Surgical History:  Procedure Laterality Date  . ABDOMINAL HYSTERECTOMY    . BREAST BIOPSY Right 04/09/2006   negative  . BREAST BIOPSY Left 12/04/2017   fibroadenmatoid change with coarse calcs  . COLONOSCOPY WITH PROPOFOL N/A 10/07/2018   Procedure: COLONOSCOPY WITH PROPOFOL;  Surgeon: [name], MD;  Location: [facility] ENDOSCOPY;  Service: Gastroenterology;  Laterality: N/A;    SOCIAL HISTORY: Social History   Socioeconomic History  . Marital status: Married    Spouse name: Paramedic  . Number of children: 2  . Years of education: some college  . Highest education level: 12th grade  Occupational History  . 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 Smoking status: Former Smoker    Packs/day: 1.00    Years: 2.00    Pack years: 2.00    Types: Cigarettes    Quit date: 1963    Years since quitting: 58.2  . Smokeless tobacco: Never Used  . Tobacco comment: smoking cessation materials not required  Substance and Sexual Activity  . Alcohol use: No    Alcohol/week: 0.0 standard drinks  . Drug use: No  . Sexual activity: Yes    Partners: Male    Birth control/protection: Post-menopausal  Other Topics Concern  . Not on file  Social History Narrative   Married for 50 years  Social Determinants of Health   Financial Resource Strain: Low Risk   . Difficulty of Paying Living Expenses: Not hard at all  Food Insecurity: No Food Insecurity  . Worried About Charity fundraiser in the Last Year: Never true  . Ran Out of Food in the Last Year: Never true  Transportation Needs: No Transportation Needs  . Lack of  Transportation (Medical): No  . Lack of Transportation (Non-Medical): No  Physical Activity: Insufficiently Active  . Days of Exercise per Week: 7 days  . Minutes of Exercise per Session: 20 min  Stress: No Stress Concern Present  . Feeling of Stress : Not at all  Social Connections: Not Isolated  . Frequency of Communication with Friends and Family: More than three times a week  . Frequency of Social Gatherings with Friends and Family: More than three times a week  . Attends Religious Services: More than 4 times per year  . Active Member of Clubs or Organizations: Yes  . Attends Archivist Meetings: More than 4 times per year  . Marital Status: Married  Human resources officer Violence: Not At Risk  . Fear of Current or Ex-Partner: No  . Emotionally Abused: No  . Physically Abused: No  . Sexually Abused: No    FAMILY HISTORY: Family History  Problem Relation Age of Onset  . Congestive Heart Failure Mother 49  . Heart attack Father 64  . Sinusitis Sister   . Stomach cancer Sister   . Uterine cancer Sister   . Liver cancer Sister   . Pancreatic cancer Sister   . Dementia Sister        84  . Breast cancer Neg Hx     ALLERGIES:  has No Known Allergies.  MEDICATIONS:  Current Outpatient Medications  Medication Sig Dispense Refill  . aspirin 81 MG tablet Take 81 mg by mouth daily.    . Biotin 5 MG CAPS Take by mouth daily.    . cholecalciferol (VITAMIN D) 1000 UNITS tablet Take by mouth.    . fexofenadine (ALLEGRA) 180 MG tablet Take 180 mg by mouth daily.    . fluticasone (FLONASE) 50 MCG/ACT nasal spray SPRAY 2 SPRAYS INTO EACH NOSTRIL EVERY DAY 48 g 1  . losartan-hydrochlorothiazide (HYZAAR) 100-25 MG tablet Take 1 tablet by mouth daily.    . rosuvastatin (CRESTOR) 20 MG tablet Take 1 tablet (20 mg total) by mouth daily. 90 tablet 1  . vitamin B-12 (CYANOCOBALAMIN) 1000 MCG tablet Take 1 tablet (1,000 mcg total) by mouth daily. 90 tablet 1   No current  facility-administered medications for this visit.     PHYSICAL EXAMINATION: ECOG PERFORMANCE STATUS: 1 - Symptomatic but completely ambulatory Vitals:   01/27/20 1002  BP: 125/81  Pulse: (!) 59  Resp: 16  Temp: (!) 96.2 F (35.7 C)   Filed Weights   01/27/20 1002  Weight: 174 lb 6.4 oz (79.1 kg)    Physical Exam Constitutional:      General: She is not in acute distress. HENT:     Head: Normocephalic and atraumatic.  Eyes:     General: No scleral icterus.    Conjunctiva/sclera: Conjunctivae normal.     Pupils: Pupils are equal, round, and reactive to light.  Cardiovascular:     Rate and Rhythm: Normal rate and regular rhythm.     Heart sounds: Normal heart sounds.  Pulmonary:     Effort: Pulmonary effort is normal. No respiratory distress.     Breath  sounds: Normal breath sounds. No wheezing or rales.  Chest:     Chest wall: No tenderness.  Abdominal:     General: Bowel sounds are normal. There is no distension.     Palpations: Abdomen is soft. There is no mass.     Tenderness: There is no abdominal tenderness.  Musculoskeletal:        General: No deformity. Normal range of motion.     Cervical back: Normal range of motion and neck supple.  Lymphadenopathy:     Cervical: No cervical adenopathy.  Skin:    General: Skin is warm and dry.     Findings: No erythema or rash.  Neurological:     Mental Status: She is alert and oriented to person, place, and time.     Cranial Nerves: No cranial nerve deficit.     Coordination: Coordination normal.  Psychiatric:        Behavior: Behavior normal.        Thought Content: Thought content normal.      LABORATORY DATA:  I have reviewed the data as listed Lab Results  Component Value Date   WBC 6.6 01/25/2020   HGB 11.8 (L) 01/25/2020   HCT 35.7 (L) 01/25/2020   MCV 93.2 01/25/2020   PLT 142 (L) 01/25/2020   Recent Labs    03/20/19 1305 03/20/19 1305 06/05/19 0924 07/27/19 0953 01/25/20 1058  NA 138  --    --  138 138  K 3.9  --   --  3.9 3.9  CL 104  --   --  101 103  CO2 27  --   --  28 27  GLUCOSE 88  --   --  171* 108*  BUN 15  --   --  22 21  CREATININE 1.38*  --   --  1.61* 1.60*  CALCIUM 9.2   < > 9.8 9.4 9.7  GFRNONAA 38*  --   --  32* 32*  GFRAA 44*  --   --  37* 37*  PROT 7.7  --   --  7.3 7.5  ALBUMIN 3.9  --   --  3.6 3.8  AST 26  --   --  24 37  ALT 19  --   --  19 19  ALKPHOS 60  --   --  58 58  BILITOT 0.6  --   --  0.5 0.7   < > = values in this interval not displayed.   Iron/TIBC/Ferritin/ %Sat    Component Value Date/Time   IRON 76 05/23/2015 0914   TIBC 276 05/23/2015 0914   FERRITIN 57 05/23/2015 0914   IRONPCTSAT 28 05/23/2015 0914    04/17/2018  SPEP no M spike. Normal serum free light chain ratio. UPEP, no M spike, elevated free kappa/lamda ratio at 20.75.   08/12/2018 UPEP no M spike  ASSESSMENT & PLAN:  1. B12 deficiency   2. Thrombocytopenia ([place])   3. Anemia in stage 3b chronic kidney disease   Labs are reviewed and discussed with patient. Anemia secondary to chronic kidney disease hemoglobin is 11.8 today, improved.  Chronic thrombocytopenia, platelet counts are stable.  Patient has had previous work-up done including negative hepatitis, HIV.  Platelet count has been stable at 1 42,000.  History of vitamin B12 deficiency, patient is on oral vitamin B12 supplementation. Today's B12 level is at 1881. Hold off vitamin B12 injection. Discussed with patient I recommend her to take vitamin B12 oral supplement 1000 MCG  twice a week.  Return to clinic for MD assessment in 1 year with repeat CBC, CMP, vitamin B12.  We will also check intrinsic antibody and antiparietal antibodies.  Orders Placed This Encounter  Procedures  . CBC with Differential/Platelet    Standing Status:   Future    Standing Expiration Date:   01/26/2021  . Comprehensive metabolic panel    Standing Status:   Future    Standing Expiration Date:   01/26/2021  . Iron and TIBC     Standing Status:   Future    Standing Expiration Date:   01/26/2021  . Ferritin    Standing Status:   Future    Standing Expiration Date:   01/26/2021  . Vitamin B12    Standing Status:   Future    Standing Expiration Date:   01/26/2021    All questions were answered. The patient knows to call the clinic with any problems questions or concerns.   Earlie Server, MD, PhD Hematology Oncology [facility] Pager- [phone] 01/27/2020

## 2020-01-27 NOTE — Progress Notes (Signed)
Patient does not offer any problems today.  

## 2020-02-11 DIAGNOSIS — H2513 Age-related nuclear cataract, bilateral: Secondary | ICD-10-CM | POA: Diagnosis not present

## 2020-03-09 IMAGING — US US TRANSVAGINAL NON-OB
1 series · 13 of 25 positions shown · non-contrast
Comparison: Prior CT from 12/18/2013.

CLINICAL DATA: Initial evaluation for right lower quadrant pain,
cyst seen on prior CT. Prior hysterectomy.

EXAM:
TRANSABDOMINAL AND TRANSVAGINAL ULTRASOUND OF PELVIS
TECHNIQUE: Both transabdominal and transvaginal ultrasound examinations of the
pelvis were performed. Transabdominal technique was performed for
global imaging of the pelvis including uterus, ovaries, adnexal
regions, and pelvic cul-de-sac. It was necessary to proceed with
endovaginal exam following the transabdominal exam to visualize the
pelvic structures.

[Series 1: us transvaginal non-ob · 78 acquisitions, 13 frames shown]
[im 1/78]
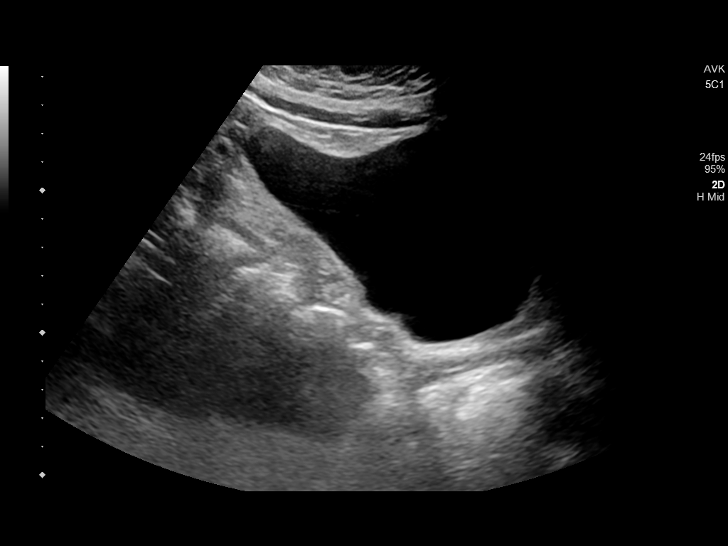
[im 7/78]
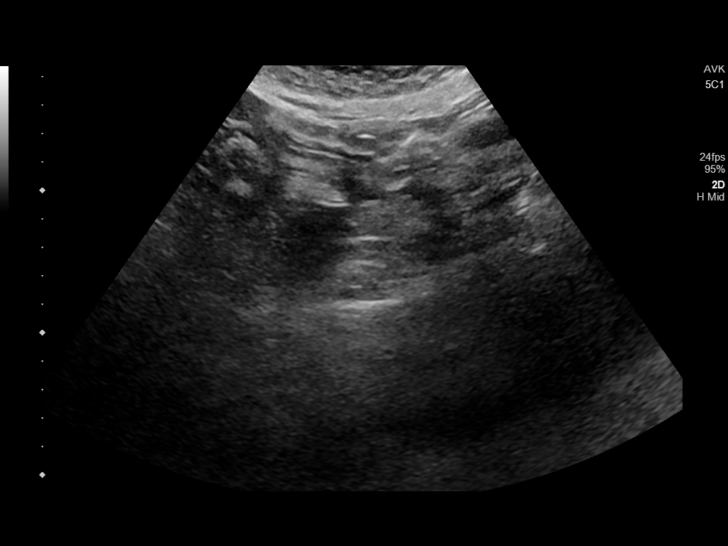
[im 13/78]
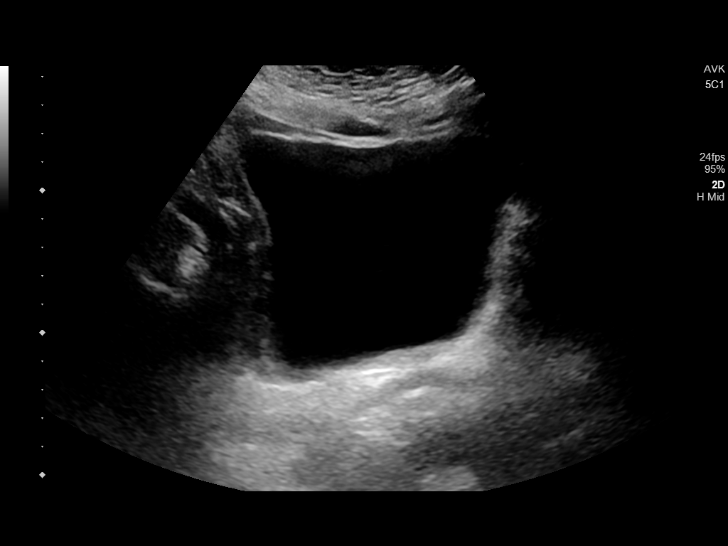
[im 20/78]
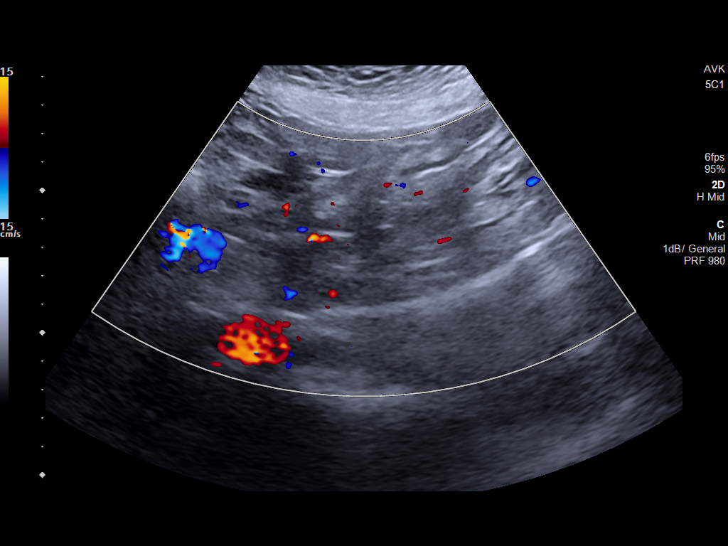
[im 26/78]
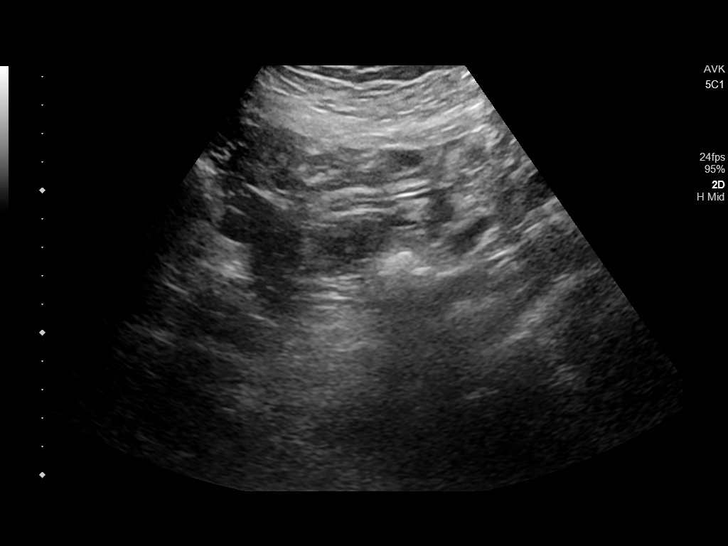
[im 33/78]
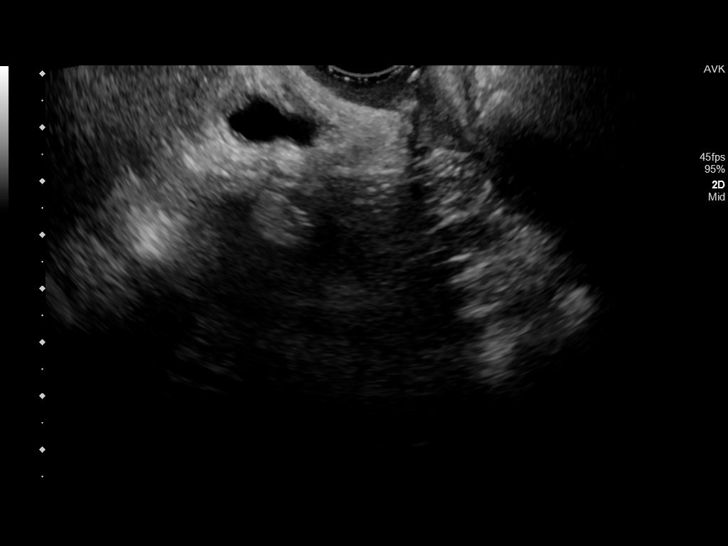
[im 39/78]
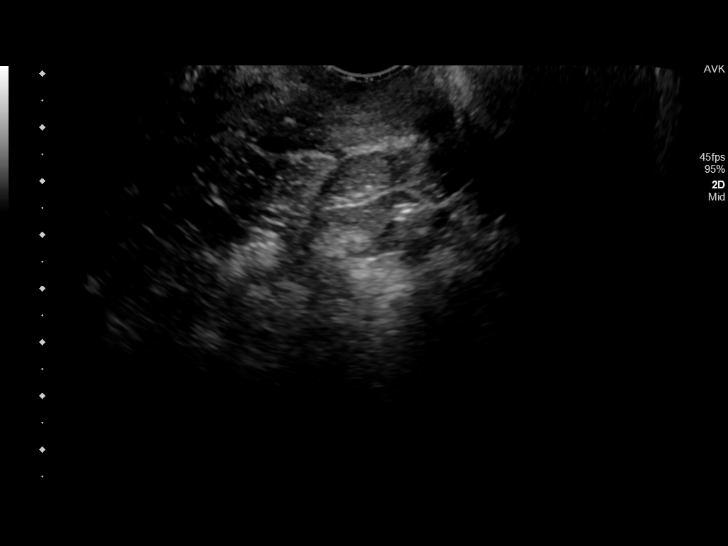
[im 45/78]
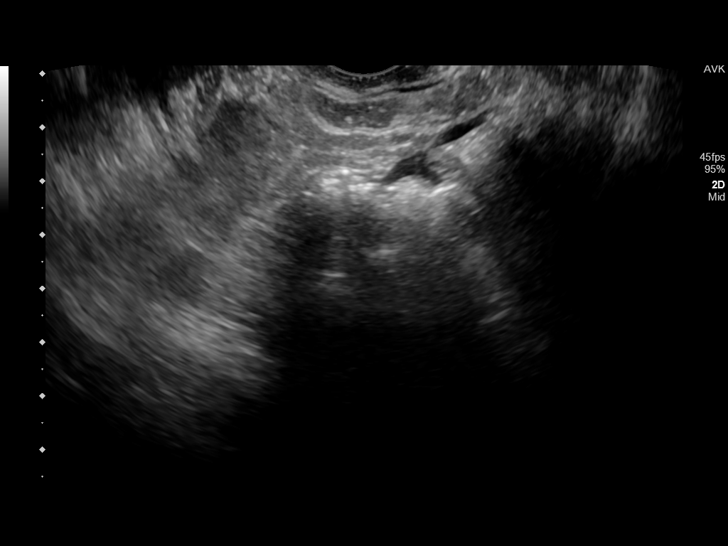
[im 52/78]
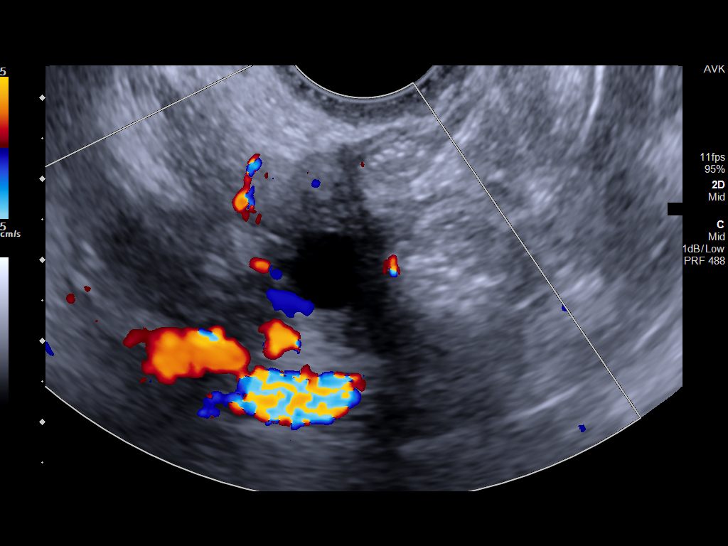
[im 58/78]
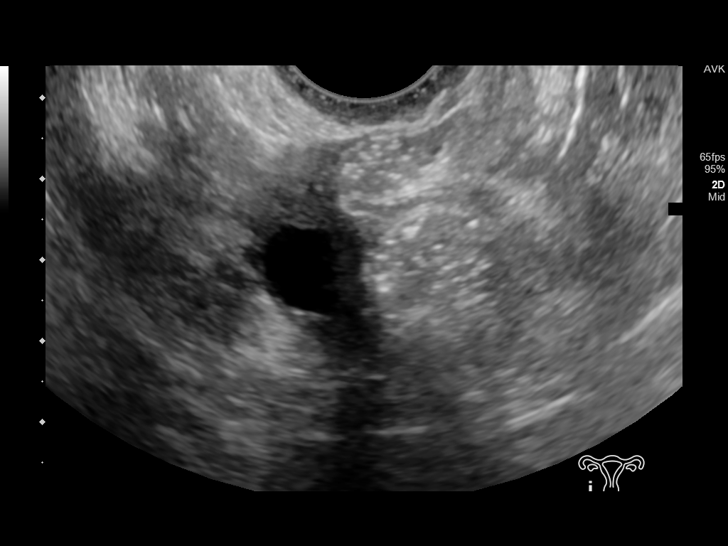
[im 65/78]
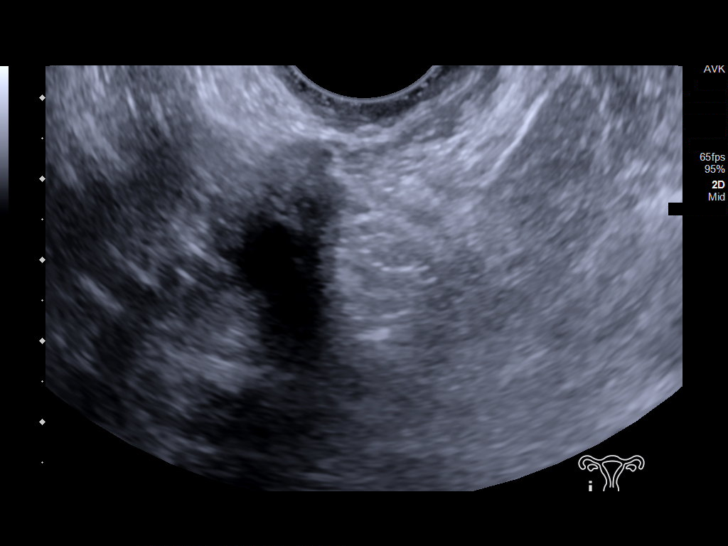
[im 71/78]
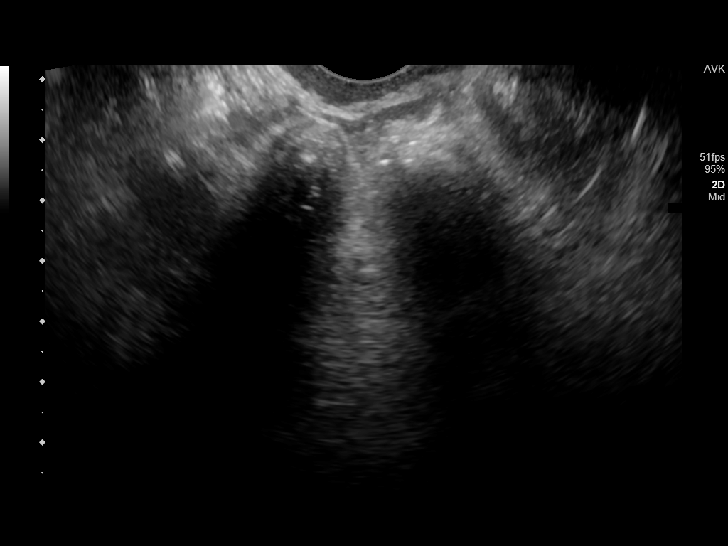
[im 78/78]
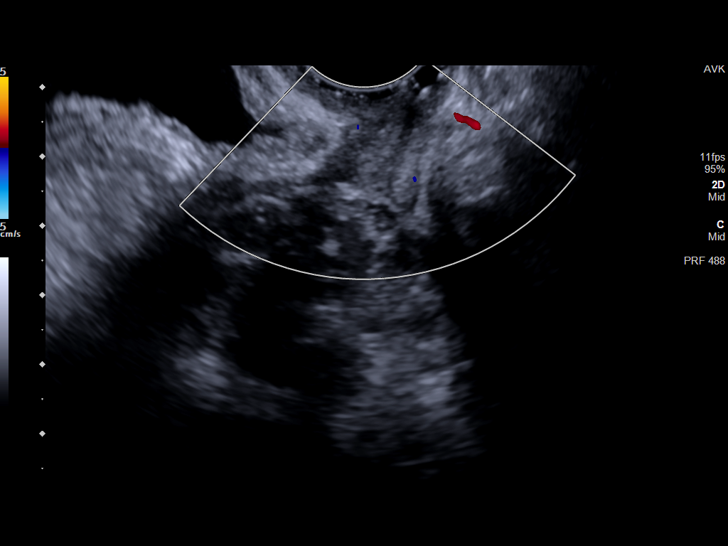

[13 of 25 positions shown; findings below may reference images not displayed]

FINDINGS: Uterus

Surgically absent.  No abnormality at the vaginal cuff.

Endometrium

Surgically absent.

Right ovary

Measurements: 2.6 x 1.6 x 1.4 cm. 1.2 x 1.1 x 0.9 cm simple right
ovarian cyst. No internal complexity or vascularity. This is
relatively stable from prior CT.

Left ovary

Not visualized.  No adnexal mass.

Other findings

No abnormal free fluid.
IMPRESSION: 1. 1.2 cm simple right ovarian cyst, relatively stable from prior CT
from 12/18/2013. While this is almost certainly benign, a follow up
ultrasound is recommended in 1 year according to the Society of
Radiologists in GltrasoundYJLJ Consensus Conference Statement (RTOYOTA
Drey et al. Management of Asymptomatic Ovarian and Other Adnexal
Cysts Imaged at US: Society of Radiologists in Ultrasound Consensus
Conference Statement 2020. Radiology [DATE]): [id number].).
2. Status post hysterectomy with nonvisualization of the uterus and
left ovary. No other adnexal mass or other abnormality.

## 2020-04-11 ENCOUNTER — Ambulatory Visit: Payer: Medicare PPO | Admitting: Family Medicine

## 2020-04-11 ENCOUNTER — Other Ambulatory Visit: Payer: Self-pay

## 2020-04-11 ENCOUNTER — Encounter: Payer: Self-pay | Admitting: Family Medicine

## 2020-04-11 VITALS — BP 120/70 | HR 84 | Temp 97.1°F | Resp 16 | Ht 65.0 in | Wt 172.1 lb

## 2020-04-11 DIAGNOSIS — E559 Vitamin D deficiency, unspecified: Secondary | ICD-10-CM | POA: Diagnosis not present

## 2020-04-11 DIAGNOSIS — I10 Essential (primary) hypertension: Secondary | ICD-10-CM | POA: Diagnosis not present

## 2020-04-11 DIAGNOSIS — R739 Hyperglycemia, unspecified: Secondary | ICD-10-CM

## 2020-04-11 DIAGNOSIS — E538 Deficiency of other specified B group vitamins: Secondary | ICD-10-CM

## 2020-04-11 DIAGNOSIS — D472 Monoclonal gammopathy: Secondary | ICD-10-CM | POA: Diagnosis not present

## 2020-04-11 DIAGNOSIS — I700 Atherosclerosis of aorta: Secondary | ICD-10-CM

## 2020-04-11 DIAGNOSIS — E785 Hyperlipidemia, unspecified: Secondary | ICD-10-CM

## 2020-04-11 DIAGNOSIS — D691 Qualitative platelet defects: Secondary | ICD-10-CM | POA: Diagnosis not present

## 2020-04-11 DIAGNOSIS — M545 Low back pain, unspecified: Secondary | ICD-10-CM

## 2020-04-11 DIAGNOSIS — R2232 Localized swelling, mass and lump, left upper limb: Secondary | ICD-10-CM

## 2020-04-11 LAB — LIPID PANEL
Cholesterol: 221 mg/dL — ABNORMAL HIGH (ref <200)
HDL: 53 mg/dL (ref >=50)
LDL Cholesterol (Calc): 144 mg/dL (calc) — ABNORMAL HIGH
Non-HDL Cholesterol (Calc): 168 mg/dL (calc) — ABNORMAL HIGH (ref <130)
Total CHOL/HDL Ratio: 4.2 (calc) (ref <5.0)
Triglycerides: 121 mg/dL (ref <150)

## 2020-04-11 MED ORDER — LOSARTAN POTASSIUM-HCTZ 100-25 MG PO TABS: 1.0000 | ORAL_TABLET | Freq: Every day | ORAL | 1 refills | Status: DC

## 2020-04-11 MED ORDER — ROSUVASTATIN CALCIUM 20 MG PO TABS: 20.0000 mg | ORAL_TABLET | Freq: Every day | ORAL | 1 refills | Status: DC

## 2020-04-11 NOTE — Progress Notes (Signed)
Name: Morgan Livingston   MRN: [id number]    DOB: [birth year]   Date:04/11/2020       Progress Note  Subjective  Chief Complaint  Chief Complaint  Patient presents with  . Hypertension  . Allergic Rhinitis   . Hypothyroidism  . Dyslipidemia  . Mass    She has a mass on her upper left arm that she has had since she was 21. Mass has started aching especially if she lays on it.    HPI   B12 deficiency /Thrombocytopenia/MUGS: under the care of [name], last B12 improved, sheis taking supplementation twice a week.She also has thrombocytopenia but stable , last level was 142   Atherosclerosis aorta/dyslipidemia: found on X-ray of lumbar spine, she does not have claudication. She will continue statin and aspirin daily,last LDL was above 100, advised to go up on Crestor to 40 mg on her last visit, but she wants to stay on 20 mg   Proteinuria and CKI stage III: seeing [name], she had secondary hyperparathyroidism,and vitamin D deficiency. She denies cramping, pruritis or decrease in urine output  .Last GFRwas stable CKI stage III - GFR of 40 , reviewed labs done at [name] 01/2020. Pth was elevated but back to normal last visit.   Positive for Sjogren's Anti SS-A and also ANA: on labs done 03/2018 by [name] , 03/2018 showed negative for M Spike UPEP spike and monitored by  [name]. She denies dry mouth , but states she has a long history of dry eyes and not changed - uses eye drops. She denies joint aches.   HTN: she is back on medication losartan/hctz and bp at home was 117/78 ( last time she checked at home ) , denies chest pain, dizziness or palpitation.   Hyperglycemia: no family history of diabetes, A1C was up to 6.4% in July and last visit was 6 %, avoiding sweets even though she likes to bake. Denies polyphagia, polydipsia or polyuria  Chronic back pain: she is working on her posture, she states worse when she wears flat shoes it causes worsening of symptoms   Mass  on left arm: going on for many years, but growing and at night she has pain when laying on left decubitis, she states not red or hot .    Patient Active Problem List   Diagnosis Date Noted  . MGUS (monoclonal gammopathy of unknown significance) 09/01/2018  . Secondary hyperparathyroidism ([place]) 09/01/2018  . B12 deficiency 08/19/2018  . Atherosclerosis of aorta ([place]) 07/25/2018  . Thrombocytopenia ([place]) 05/09/2018  . Proteinuria 05/09/2018  . Positive ANA (antinuclear antibody) 04/02/2018  . Bilateral dry eyes 04/02/2018  . History of hysterectomy 05/23/2015  . Chronic kidney disease, stage III (moderate) 05/23/2015  . Benign hypertension 05/22/2015  . Chronic constipation 05/22/2015  . DDD (degenerative disc disease), lumbar 05/22/2015  . DD (diverticular disease) 05/22/2015  . Dyslipidemia 05/22/2015  . History of cervical cancer 05/22/2015  . Helicobacter pylori gastrointestinal tract infection 05/22/2015  . Blood glucose elevated 05/22/2015  . Overweight (BMI 25.0-29.9) 05/22/2015  . Perennial allergic rhinitis with seasonal variation 05/22/2015  . Vitamin D deficiency 05/22/2015  . Impingement syndrome of shoulder 01/14/2015    Past Surgical History:  Procedure Laterality Date  . ABDOMINAL HYSTERECTOMY    . BREAST BIOPSY Right 04/09/2006   negative  . BREAST BIOPSY Left 12/04/2017   fibroadenmatoid change with coarse calcs  . COLONOSCOPY WITH PROPOFOL N/A 10/07/2018   Procedure: COLONOSCOPY WITH PROPOFOL;  Surgeon: [name], MD;  Location: [facility] ENDOSCOPY;  Service: Gastroenterology;  Laterality: N/A;    Family History  Problem Relation Age of Onset  . Congestive Heart Failure Mother 71  . Heart attack Father 1  . Sinusitis Sister   . Stomach cancer Sister   . Uterine cancer Sister   . Liver cancer Sister   . Pancreatic cancer Sister   . Dementia Sister        75  . Breast cancer Neg Hx     Social History   Tobacco Use  . Smoking status: Former Smoker     Packs/day: 1.00    Years: 2.00    Pack years: 2.00    Types: Cigarettes    Quit date: 1963    Years since quitting: 58.4  . Smokeless tobacco: Never Used  . Tobacco comment: smoking cessation materials not required  Substance Use Topics  . Alcohol use: No    Alcohol/week: 0.0 standard drinks     Current Outpatient Medications:  .  aspirin 81 MG tablet, Take 81 mg by mouth daily., Disp: , Rfl:  .  Biotin 5 MG CAPS, Take by mouth daily., Disp: , Rfl:  .  cholecalciferol (VITAMIN D) 1000 UNITS tablet, Take by mouth., Disp: , Rfl:  .  fexofenadine (ALLEGRA) 180 MG tablet, Take 180 mg by mouth daily., Disp: , Rfl:  .  fluticasone (FLONASE) 50 MCG/ACT nasal spray, SPRAY 2 SPRAYS INTO EACH NOSTRIL EVERY DAY, Disp: 48 g, Rfl: 1 .  losartan-hydrochlorothiazide (HYZAAR) 100-25 MG tablet, Take 1 tablet by mouth daily., Disp: , Rfl:  .  rosuvastatin (CRESTOR) 20 MG tablet, Take 1 tablet (20 mg total) by mouth daily., Disp: 90 tablet, Rfl: 1 .  vitamin B-12 (CYANOCOBALAMIN) 1000 MCG tablet, Take 1 tablet (1,000 mcg total) by mouth daily., Disp: 90 tablet, Rfl: 1  No Known Allergies  I personally reviewed active problem list, medication list, allergies, family history, social history, health maintenance with the patient/caregiver today.   ROS  Constitutional: Negative for fever or weight change.  Respiratory: Negative for cough and shortness of breath.   Cardiovascular: Negative for chest pain or palpitations.  Gastrointestinal: Negative for abdominal pain, no bowel changes.  Musculoskeletal: Negative for gait problem or joint swelling.  Skin: Negative for rash.  Neurological: Negative for dizziness or headache.  No other specific complaints in a complete review of systems (except as listed in HPI above).  Objective  Vitals:   04/11/20 1119  BP: 120/70  Pulse: 84  Resp: 16  Temp: (!) 97.1 F (36.2 C)  TempSrc: Temporal  SpO2: 98%  Weight: 172 lb 1.6 oz (78.1 kg)  Height: 5' 5"   (1.651 m)    Body mass index is 28.64 kg/m.  Physical Exam  Constitutional: Patient appears well-developed and well-nourished. Overweight.  No distress.  HEENT: head atraumatic, normocephalic, pupils equal and reactive to light,  neck supple, throat within normal limits Cardiovascular: Normal rate, regular rhythm and normal heart sounds.  No murmur heard. No BLE edema. Pulmonary/Chest: Effort normal and breath sounds normal. No respiratory distress. Abdominal: Soft.  There is no tenderness. Psychiatric: Patient has a normal mood and affect. behavior is normal. Judgment and thought content normal.  Recent Results (from the past 2160 hour(s))  Vitamin B12     Status: Abnormal   Collection Time: 01/25/20 10:58 AM  Result Value Ref Range   Vitamin B-12 1,881 (H) 180 - 914 pg/mL    Comment: (NOTE) This assay is  not validated for testing neonatal or myeloproliferative syndrome specimens for Vitamin B12 levels. Performed at [facility], [place] [address], [place], [place] [address]   Comprehensive metabolic panel     Status: Abnormal   Collection Time: 01/25/20 10:58 AM  Result Value Ref Range   Sodium 138 135 - 145 mmol/L   Potassium 3.9 3.5 - 5.1 mmol/L   Chloride 103 98 - 111 mmol/L   CO2 27 22 - 32 mmol/L   Glucose, Bld 108 (H) 70 - 99 mg/dL    Comment: Glucose reference range applies only to samples taken after fasting for at least 8 hours.   BUN 21 8 - 23 mg/dL   Creatinine, Ser 1.60 (H) 0.44 - 1.00 mg/dL   Calcium 9.7 8.9 - 10.3 mg/dL   Total Protein 7.5 6.5 - 8.1 g/dL   Albumin 3.8 3.5 - 5.0 g/dL   AST 37 15 - 41 U/L   ALT 19 0 - 44 U/L   Alkaline Phosphatase 58 38 - 126 U/L   Total Bilirubin 0.7 0.3 - 1.2 mg/dL   GFR calc non Af Amer 32 (L) >60 mL/min   GFR calc Af Amer 37 (L) >60 mL/min   Anion gap 8 5 - 15    Comment: Performed at [facility], [address] [place], [place] [address]  Retic Panel     Status: Abnormal   Collection Time: 01/25/20 10:58  AM  Result Value Ref Range   Retic Ct Pct 1.1 0.4 - 3.1 %   RBC. 3.77 (L) 3.87 - 5.11 MIL/uL   Retic Count, Absolute 42.2 19.0 - 186.0 K/uL   Immature Retic Fract 9.7 2.3 - 15.9 %   Reticulocyte Hemoglobin 34.5 >27.9 pg    Comment:        Given the high negative predictive value of a RET-He result > 32 pg iron deficiency is essentially excluded. If this patient is anemic other etiologies should be considered. Performed at [facility], [address] [place], [place] [address]   CBC with Differential/Platelet     Status: Abnormal   Collection Time: 01/25/20 10:58 AM  Result Value Ref Range   WBC 6.6 4.0 - 10.5 K/uL   RBC 3.83 (L) 3.87 - 5.11 MIL/uL   Hemoglobin 11.8 (L) 12.0 - 15.0 g/dL   HCT 35.7 (L) 36.0 - 46.0 %   MCV 93.2 80.0 - 100.0 fL   MCH 30.8 26.0 - 34.0 pg   MCHC 33.1 30.0 - 36.0 g/dL   RDW 13.8 11.5 - 15.5 %   Platelets 142 (L) 150 - 400 K/uL   nRBC 0.0 0.0 - 0.2 %   Neutrophils Relative % 46 %   Neutro Abs 3.0 1.7 - 7.7 K/uL   Lymphocytes Relative 43 %   Lymphs Abs 2.8 0.7 - 4.0 K/uL   Monocytes Relative 10 %   Monocytes Absolute 0.6 0.1 - 1.0 K/uL   Eosinophils Relative 1 %   Eosinophils Absolute 0.1 0.0 - 0.5 K/uL   Basophils Relative 0 %   Basophils Absolute 0.0 0.0 - 0.1 K/uL   Immature Granulocytes 0 %   Abs Immature Granulocytes 0.01 0.00 - 0.07 K/uL    Comment: Performed at [facility], [address], [place],  [address]     PHQ2/9: Depression screen [facility] 2/9 04/11/2020 12/29/2019 12/04/2019 09/02/2019 06/05/2019  Decreased Interest 0 0 0 0 0  Down, Depressed, Hopeless 0 0 0 0 0  PHQ -  2 Score 0 0 0 0 0  Altered sleeping 0 - 0 0 0  Tired, decreased energy 0 - 0 0 0  Change in appetite 0 - 0 0 0  Feeling bad or failure about yourself  0 - 0 0 0  Trouble concentrating 0 - 0 0 0  Moving slowly or fidgety/restless 0 - 0 0 0  Suicidal thoughts 0 - 0 0 0  PHQ-9 Score 0 - 0 0 0  Difficult doing work/chores - - Not difficult at  all - -  Some recent data might be hidden    phq 9 is negative   Fall Risk: Fall Risk  04/11/2020 12/29/2019 12/04/2019 09/02/2019 06/05/2019  Falls in the past year? 0 0 0 0 0  Number falls in past yr: 0 0 0 0 0  Injury with Fall? 0 0 0 0 0  Risk for fall due to : - No Fall Risks - - -  Follow up - Falls prevention discussed - - -    Functional Status Survey: Is the patient deaf or have difficulty hearing?: No Does the patient have difficulty seeing, even when wearing glasses/contacts?: No Does the patient have difficulty concentrating, remembering, or making decisions?: No Does the patient have difficulty walking or climbing stairs?: No Does the patient have difficulty dressing or bathing?: No Does the patient have difficulty doing errands alone such as visiting a doctor's office or shopping?: No   Assessment & Plan   1. Dyslipidemia  - rosuvastatin (CRESTOR) 20 MG tablet; Take 1 tablet (20 mg total) by mouth daily.  Dispense: 90 tablet; Refill: 1  2. Atherosclerosis of aorta (H[facility])  - rosuvastatin (CRESTOR) 20 MG tablet; Take 1 tablet (20 mg total) by mouth daily.  Dispense: 90 tablet; Refill: 1  3. Essential hypertension  - losartan-hydrochlorothiazide (HYZAAR) 100-25 MG tablet; Take 1 tablet by mouth daily.  Dispense: 90 tablet; Refill: 1  4. Thrombocytopathia ([place])  Stable   5. B12 deficiency  Taking medication   6. MGUS (monoclonal gammopathy of unknown significance)  Sees [name]  7. Vitamin D deficiency  Taking supplementation  8. Hyperglycemia  Doing well with life style modification   9. Bilateral low back pain without sciatica, unspecified chronicity  Stable

## 2020-04-12 ENCOUNTER — Telehealth: Payer: Self-pay | Admitting: General Surgery

## 2020-04-12 ENCOUNTER — Other Ambulatory Visit: Payer: Self-pay

## 2020-04-12 ENCOUNTER — Ambulatory Visit: Payer: Medicare PPO | Admitting: General Surgery

## 2020-04-12 ENCOUNTER — Encounter: Payer: Self-pay | Admitting: General Surgery

## 2020-04-12 VITALS — BP 139/78 | HR 61 | Temp 97.2°F | Ht 65.0 in | Wt 173.6 lb

## 2020-04-12 DIAGNOSIS — R2232 Localized swelling, mass and lump, left upper limb: Secondary | ICD-10-CM | POA: Diagnosis not present

## 2020-04-12 NOTE — Telephone Encounter (Signed)
Pt has been advised of Pre-Admission date/time, COVID Testing date and Surgery date.  Surgery Date: 04/15/20 Preadmission Testing Date: 04/15/20 (office, patient to arrive 2 hours early per preadmit) Covid Testing Date: 04/13/20 - patient advised to go to the [facility] ([place]) between 8a-1p   Patient has been made aware to call [phone], between 1-3:00pm the day before surgery, to find out what time to arrive for surgery.

## 2020-04-12 NOTE — H&P (View-Only) (Signed)
Patient ID: Morgan Livingston, female   DOB: October 03, 1946, 74 y.o.   MRN: [id number]  Chief Complaint  Patient presents with  . New Patient (Initial Visit)    arm mass     HPI Morgan Livingston is a 74 y.o. female.   She was referred by her primary care provider for evaluation of a mass on her left arm.  Morgan Livingston states that it has been present for 20 years.  Initially, it was about nickel size.  Over the past 5 years, it is increased significantly in size.  It is not big enough that it bothers her when she lies on that side.  Sometimes it is sensitive to cold.  The discomfort is localized to the area of the mass.  She is interested in surgical resection.  No imaging has been obtained at the site.   Past Medical History:  Diagnosis Date  . Allergy   . Cancer [facility])    Uterine Cancer  . Hyperlipidemia   . Hypertension   . Kidney stones 06/21/2018    Past Surgical History:  Procedure Laterality Date  . ABDOMINAL HYSTERECTOMY    . BREAST BIOPSY Right 04/09/2006   negative  . BREAST BIOPSY Left 12/04/2017   fibroadenmatoid change with coarse calcs  . COLONOSCOPY WITH PROPOFOL N/A 10/07/2018   Procedure: COLONOSCOPY WITH PROPOFOL;  Surgeon: [name], MD;  Location: [facility] ENDOSCOPY;  Service: Gastroenterology;  Laterality: N/A;    Family History  Problem Relation Age of Onset  . Congestive Heart Failure Mother 73  . Heart attack Father 8  . Sinusitis Sister   . Stomach cancer Sister   . Uterine cancer Sister 11  . Liver cancer Sister   . Pancreatic cancer Sister   . Dementia Sister        48  . Breast cancer Neg Hx     Social History Social History   Tobacco Use  . Smoking status: Former Smoker    Packs/day: 1.00    Years: 2.00    Pack years: 2.00    Types: Cigarettes    Quit date: 1963    Years since quitting: 58.4  . Smokeless tobacco: Never Used  . Tobacco comment: smoking cessation materials not required  Substance Use Topics  . Alcohol use: No    Alcohol/week:  0.0 standard drinks  . Drug use: No    No Known Allergies  Current Outpatient Medications  Medication Sig Dispense Refill  . aspirin 81 MG tablet Take 81 mg by mouth daily.    . Biotin 5 MG CAPS Take by mouth daily.    . cholecalciferol (VITAMIN D) 1000 UNITS tablet Take by mouth.    . fexofenadine (ALLEGRA) 180 MG tablet Take 180 mg by mouth daily.    . fluticasone (FLONASE) 50 MCG/ACT nasal spray SPRAY 2 SPRAYS INTO EACH NOSTRIL EVERY DAY 48 g 1  . losartan-hydrochlorothiazide (HYZAAR) 100-25 MG tablet Take 1 tablet by mouth daily. 90 tablet 1  . rosuvastatin (CRESTOR) 20 MG tablet Take 1 tablet (20 mg total) by mouth daily. 90 tablet 1  . vitamin B-12 (CYANOCOBALAMIN) 1000 MCG tablet Take 1 tablet (1,000 mcg total) by mouth daily. 90 tablet 1   No current facility-administered medications for this visit.    Review of Systems Review of Systems  All other systems reviewed and are negative.   Blood pressure 139/78, pulse 61, temperature (!) 97.2 F (36.2 C), height 5\' 5"  (1.651 m), weight 173 lb 9.6 oz (78.7  kg). Body mass index is 28.89 kg/m.  Physical Exam Physical Exam Vitals reviewed.  Constitutional:      General: She is not in acute distress.    Appearance: Normal appearance. She is normal weight.  HENT:     Head: Normocephalic and atraumatic.     Nose:     Comments: Covered with a mask secondary to COVID-19 precautions    Mouth/Throat:     Comments: Covered with a mask secondary to COVID-19 precautions Eyes:     General: No scleral icterus.       Right eye: No discharge.        Left eye: No discharge.     Conjunctiva/sclera: Conjunctivae normal.  Neck:     Comments: No palpable thyromegaly or dominant thyroid masses appreciated.  The gland moves freely with deglutition.  There is no palpable cervical or supraclavicular lymphadenopathy. Cardiovascular:     Rate and Rhythm: Normal rate and regular rhythm.     Pulses: Normal pulses.  Pulmonary:     Effort:  Pulmonary effort is normal.     Breath sounds: Normal breath sounds.  Abdominal:     General: Bowel sounds are normal.     Palpations: Abdomen is soft.  Genitourinary:    Comments: Deferred Musculoskeletal:        General: No swelling or deformity.       Arms:     Comments: There is an approximately 12 to 14 cm soft mass on the outer aspect of the patient's left upper arm.  It is soft and well-circumscribed.  There is one area at about the 1 o'clock position that is somewhat more firm and feels like it may be tethered.  Skin:    General: Skin is warm and dry.  Neurological:     General: No focal deficit present.     Mental Status: She is alert.  Psychiatric:        Mood and Affect: Mood normal.        Behavior: Behavior normal.     Data Reviewed Upon review of the electronic medical record, I see that multiple imaging studies of other parts of her body have been performed, but none of the left upper extremity.  I also reviewed Dr. Ancil Boozer note from yesterday which addresses the growth and discomfort related to the mass on her left arm.  Assessment This is a 74 year old woman with an enlarging mass on her left upper arm.  Clinical impression is most consistent with a lipoma, however due to the discomfort and size of the mass, I have offered her surgical resection.  Plan We will arrange an OR date.  I discussed the risks of the surgery with the patient.  These include, but are not limited to, bleeding, infection, damage to surrounding tissues, numbness, wound healing problems, recurrence, and pain.  She had the opportunity to ask questions.  These were answered to her satisfaction.  We will proceed with getting her scheduled.    Fredirick Maudlin 04/12/2020, 11:16 AM

## 2020-04-12 NOTE — Patient Instructions (Addendum)
You have requested to have your gallbladder removed. This will be done on at [facility] with Dr. Celine Ahr.  You will most likely be out of work 1 week for this surgery. You will return after your post-op appointment with a lifting restriction for approximately 4 more weeks.  Please see the (blue)pre-care form that you have been given today. If you have any questions, please call our office. Our surgery scheduler will call you to go over surgery dates and instructions and Covid testing information.     Lipoma Removal  Lipoma removal is a surgical procedure to remove a lipoma, which is a noncancerous (benign) tumor that is made up of fat cells. Most lipomas are small and painless and do not require treatment. They can form in many areas of the body but are most common under the skin of the back, arms, shoulders, buttocks, and thighs. You may need lipoma removal if you have a lipoma that is large, growing, or causing discomfort. Lipoma removal may also be done for cosmetic reasons. Tell a health care provider about:  Any allergies you have.  All medicines you are taking, including vitamins, herbs, eye drops, creams, and over-the-counter medicines.  Any problems you or family members have had with anesthetic medicines.  Any blood disorders you have.  Any surgeries you have had.  Any medical conditions you have.  Whether you are pregnant or may be pregnant. What are the risks? Generally, this is a safe procedure. However, problems may occur, including:  Infection.  Bleeding.  Scarring.  Allergic reactions to medicines.  Damage to nearby structures or organs, such as damage to nerves or blood vessels near the lipoma. What happens before the procedure? Staying hydrated Follow instructions from your health care provider about hydration, which may include:  Up to 2 hours before the procedure - you may continue to drink clear liquids, such as water, clear fruit juice, black  coffee, and plain tea. Eating and drinking restrictions Follow instructions from your health care provider about eating and drinking, which may include:  8 hours before the procedure - stop eating heavy meals or foods, such as meat, fried foods, or fatty foods.  6 hours before the procedure - stop eating light meals or foods, such as toast or cereal.  6 hours before the procedure - stop drinking milk or drinks that contain milk.  2 hours before the procedure - stop drinking clear liquids. Medicines Ask your health care provider about:  Changing or stopping your regular medicines. This is especially important if you are taking diabetes medicines or blood thinners.  Taking medicines such as aspirin and ibuprofen. These medicines can thin your blood. Do not take these medicines unless your health care provider tells you to take them.  Taking over-the-counter medicines, vitamins, herbs, and supplements. General instructions  You will have a physical exam. Your health care provider will check the size of the lipoma and whether it can be moved easily.  You may have a biopsy and imaging tests, such as X-rays, a CT scan, and an MRI.  Do not use any products that contain nicotine or tobacco for at least 4 weeks before the procedure. These products include cigarettes, e-cigarettes, and chewing tobacco. If you need help quitting, ask your health care provider.  Ask your health care provider: ? How your surgery site will be marked. ? What steps will be taken to help prevent infection. These may include:  Washing skin with a germ-killing soap.  Taking antibiotic  medicine.  Plan to have someone take you home from the hospital or clinic.  If you will be going home right after the procedure, plan to have someone with you for 24 hours. What happens during the procedure?   An IV will be inserted into one of your veins.  You will be given one or more of the following: ? A medicine to help  you relax (sedative). ? A medicine to numb the area (local anesthetic). ? A medicine to make you fall asleep (general anesthetic). ? A medicine that is injected into an area of your body to numb everything below the injection site (regional anesthetic).  An incision will be made over the lipoma or very near the lipoma. The incision may be made in a natural skin line or crease.  Tissues, nerves, and blood vessels near the lipoma will be moved out of the way.  The lipoma and the capsule that surrounds it will be separated from the surrounding tissues.  The lipoma will be removed.  The incision may be closed with stitches (sutures).  A bandage (dressing) will be placed over the incision. The procedure may vary among health care providers and hospitals. What happens after the procedure?  Your blood pressure, heart rate, breathing rate, and blood oxygen level will be monitored until you leave the hospital or clinic.  If you were prescribed an antibiotic medicine, use it as told by your health care provider. Do not stop using the antibiotic even if you start to feel better.  If you were given a sedative during the procedure, it can affect you for several hours. Do not drive or operate machinery until your health care provider says that it is safe. Summary  Before the procedure, follow instructions from your health care provider about eating and drinking, and changing or stopping your regular medicines. This is especially important if you are taking diabetes medicines or blood thinners.  After the lipoma is removed, the incision may be closed with stitches (sutures) and covered with a bandage (dressing).  If you were given a sedative during the procedure, it can affect you for several hours. Do not drive or operate machinery until your health care provider says that it is safe. This information is not intended to replace advice given to you by your health care provider. Make sure you discuss  any questions you have with your health care provider. Document Revised: 06/08/2019 Document Reviewed: 06/08/2019 Elsevier Patient Education  [place].

## 2020-04-12 NOTE — Progress Notes (Signed)
Patient ID: Morgan Livingston, female   DOB: 01/24/1946, 74 y.o.   MRN: [id number]  Chief Complaint  Patient presents with  . New Patient (Initial Visit)    arm mass     HPI Morgan Livingston is a 74 y.o. female.   She was referred by her primary care provider for evaluation of a mass on her left arm.  Morgan Livingston states that it has been present for 20 years.  Initially, it was about nickel size.  Over the past 5 years, it is increased significantly in size.  It is not big enough that it bothers her when she lies on that side.  Sometimes it is sensitive to cold.  The discomfort is localized to the area of the mass.  She is interested in surgical resection.  No imaging has been obtained at the site.   Past Medical History:  Diagnosis Date  . Allergy   . Cancer [facility])    Uterine Cancer  . Hyperlipidemia   . Hypertension   . Kidney stones 06/21/2018    Past Surgical History:  Procedure Laterality Date  . ABDOMINAL HYSTERECTOMY    . BREAST BIOPSY Right 04/09/2006   negative  . BREAST BIOPSY Left 12/04/2017   fibroadenmatoid change with coarse calcs  . COLONOSCOPY WITH PROPOFOL N/A 10/07/2018   Procedure: COLONOSCOPY WITH PROPOFOL;  Surgeon: [name], MD;  Location: [facility] ENDOSCOPY;  Service: Gastroenterology;  Laterality: N/A;    Family History  Problem Relation Age of Onset  . Congestive Heart Failure Mother 15  . Heart attack Father 50  . Sinusitis Sister   . Stomach cancer Sister   . Uterine cancer Sister 62  . Liver cancer Sister   . Pancreatic cancer Sister   . Dementia Sister        45  . Breast cancer Neg Hx     Social History Social History   Tobacco Use  . Smoking status: Former Smoker    Packs/day: 1.00    Years: 2.00    Pack years: 2.00    Types: Cigarettes    Quit date: 1963    Years since quitting: 58.4  . Smokeless tobacco: Never Used  . Tobacco comment: smoking cessation materials not required  Substance Use Topics  . Alcohol use: No    Alcohol/week:  0.0 standard drinks  . Drug use: No    No Known Allergies  Current Outpatient Medications  Medication Sig Dispense Refill  . aspirin 81 MG tablet Take 81 mg by mouth daily.    . Biotin 5 MG CAPS Take by mouth daily.    . cholecalciferol (VITAMIN D) 1000 UNITS tablet Take by mouth.    . fexofenadine (ALLEGRA) 180 MG tablet Take 180 mg by mouth daily.    . fluticasone (FLONASE) 50 MCG/ACT nasal spray SPRAY 2 SPRAYS INTO EACH NOSTRIL EVERY DAY 48 g 1  . losartan-hydrochlorothiazide (HYZAAR) 100-25 MG tablet Take 1 tablet by mouth daily. 90 tablet 1  . rosuvastatin (CRESTOR) 20 MG tablet Take 1 tablet (20 mg total) by mouth daily. 90 tablet 1  . vitamin B-12 (CYANOCOBALAMIN) 1000 MCG tablet Take 1 tablet (1,000 mcg total) by mouth daily. 90 tablet 1   No current facility-administered medications for this visit.    Review of Systems Review of Systems  All other systems reviewed and are negative.   Blood pressure 139/78, pulse 61, temperature (!) 97.2 F (36.2 C), height 5\' 5"  (1.651 m), weight 173 lb 9.6 oz (78.7  kg). Body mass index is 28.89 kg/m.  Physical Exam Physical Exam Vitals reviewed.  Constitutional:      General: She is not in acute distress.    Appearance: Normal appearance. She is normal weight.  HENT:     Head: Normocephalic and atraumatic.     Nose:     Comments: Covered with a mask secondary to COVID-19 precautions    Mouth/Throat:     Comments: Covered with a mask secondary to COVID-19 precautions Eyes:     General: No scleral icterus.       Right eye: No discharge.        Left eye: No discharge.     Conjunctiva/sclera: Conjunctivae normal.  Neck:     Comments: No palpable thyromegaly or dominant thyroid masses appreciated.  The gland moves freely with deglutition.  There is no palpable cervical or supraclavicular lymphadenopathy. Cardiovascular:     Rate and Rhythm: Normal rate and regular rhythm.     Pulses: Normal pulses.  Pulmonary:     Effort:  Pulmonary effort is normal.     Breath sounds: Normal breath sounds.  Abdominal:     General: Bowel sounds are normal.     Palpations: Abdomen is soft.  Genitourinary:    Comments: Deferred Musculoskeletal:        General: No swelling or deformity.       Arms:     Comments: There is an approximately 12 to 14 cm soft mass on the outer aspect of the patient's left upper arm.  It is soft and well-circumscribed.  There is one area at about the 1 o'clock position that is somewhat more firm and feels like it may be tethered.  Skin:    General: Skin is warm and dry.  Neurological:     General: No focal deficit present.     Mental Status: She is alert.  Psychiatric:        Mood and Affect: Mood normal.        Behavior: Behavior normal.     Data Reviewed Upon review of the electronic medical record, I see that multiple imaging studies of other parts of her body have been performed, but none of the left upper extremity.  I also reviewed Dr. Ancil Boozer note from yesterday which addresses the growth and discomfort related to the mass on her left arm.  Assessment This is a 74 year old woman with an enlarging mass on her left upper arm.  Clinical impression is most consistent with a lipoma, however due to the discomfort and size of the mass, I have offered her surgical resection.  Plan We will arrange an OR date.  I discussed the risks of the surgery with the patient.  These include, but are not limited to, bleeding, infection, damage to surrounding tissues, numbness, wound healing problems, recurrence, and pain.  She had the opportunity to ask questions.  These were answered to her satisfaction.  We will proceed with getting her scheduled.    Fredirick Maudlin 04/12/2020, 11:16 AM

## 2020-04-13 ENCOUNTER — Other Ambulatory Visit: Payer: Self-pay

## 2020-04-13 ENCOUNTER — Other Ambulatory Visit
Admission: RE | Admit: 2020-04-13 | Discharge: 2020-04-13 | Disposition: A | Discharge status: Home / Self Care | Payer: Medicare PPO | Source: Ambulatory Visit | Attending: General Surgery | Admitting: General Surgery

## 2020-04-13 DIAGNOSIS — Z01812 Encounter for preprocedural laboratory examination: Secondary | ICD-10-CM | POA: Diagnosis not present

## 2020-04-13 DIAGNOSIS — Z20822 Contact with and (suspected) exposure to covid-19: Secondary | ICD-10-CM | POA: Insufficient documentation

## 2020-04-13 LAB — SARS CORONAVIRUS 2 (TAT 6-24 HRS): SARS Coronavirus 2: NEGATIVE

## 2020-04-15 ENCOUNTER — Ambulatory Visit: Payer: Medicare PPO | Admitting: Anesthesiology

## 2020-04-15 ENCOUNTER — Encounter
Admission: RE | Disposition: A | Discharge status: Home / Self Care | Payer: Self-pay | Source: Ambulatory Visit | Attending: General Surgery

## 2020-04-15 ENCOUNTER — Ambulatory Visit
Admission: RE | Admit: 2020-04-15 | Discharge: 2020-04-15 | Disposition: A | Discharge status: Home / Self Care | Payer: Medicare PPO | Source: Ambulatory Visit | Attending: General Surgery | Admitting: General Surgery

## 2020-04-15 ENCOUNTER — Other Ambulatory Visit: Payer: Self-pay

## 2020-04-15 DIAGNOSIS — Z87891 Personal history of nicotine dependence: Secondary | ICD-10-CM | POA: Insufficient documentation

## 2020-04-15 DIAGNOSIS — E785 Hyperlipidemia, unspecified: Secondary | ICD-10-CM | POA: Diagnosis not present

## 2020-04-15 DIAGNOSIS — D1722 Benign lipomatous neoplasm of skin and subcutaneous tissue of left arm: Secondary | ICD-10-CM | POA: Insufficient documentation

## 2020-04-15 DIAGNOSIS — Z8542 Personal history of malignant neoplasm of other parts of uterus: Secondary | ICD-10-CM | POA: Diagnosis not present

## 2020-04-15 DIAGNOSIS — R2232 Localized swelling, mass and lump, left upper limb: Secondary | ICD-10-CM

## 2020-04-15 DIAGNOSIS — Z01810 Encounter for preprocedural cardiovascular examination: Secondary | ICD-10-CM | POA: Diagnosis not present

## 2020-04-15 DIAGNOSIS — Z79899 Other long term (current) drug therapy: Secondary | ICD-10-CM | POA: Insufficient documentation

## 2020-04-15 DIAGNOSIS — I10 Essential (primary) hypertension: Secondary | ICD-10-CM | POA: Diagnosis not present

## 2020-04-15 DIAGNOSIS — Z7982 Long term (current) use of aspirin: Secondary | ICD-10-CM | POA: Diagnosis not present

## 2020-04-15 HISTORY — PX: LIPOMA EXCISION: SHX5283

## 2020-04-15 SURGERY — EXCISION LIPOMA
Anesthesia: General | Site: Arm Upper | Laterality: Left

## 2020-04-15 MED ORDER — DEXAMETHASONE SODIUM PHOSPHATE 10 MG/ML IJ SOLN
INTRAMUSCULAR | Status: AC
  Filled 2020-04-15: qty 1

## 2020-04-15 MED ORDER — SUGAMMADEX SODIUM 200 MG/2ML IV SOLN
INTRAVENOUS | Status: DC | PRN
  Administered 2020-04-15: 100 mg via INTRAVENOUS

## 2020-04-15 MED ORDER — IBUPROFEN 800 MG PO TABS
800.0000 mg | ORAL_TABLET | Freq: Three times a day (TID) | ORAL | 0 refills | Status: DC | PRN
Start: 2020-04-15 — End: 2020-04-29

## 2020-04-15 MED ORDER — ACETAMINOPHEN 500 MG PO TABS: 1000.0000 mg | ORAL_TABLET | Freq: Four times a day (QID) | ORAL | 0 refills | Status: AC | PRN

## 2020-04-15 MED ORDER — BUPIVACAINE LIPOSOME 1.3 % IJ SUSP: 20.0000 mL | Freq: Once | INTRAMUSCULAR | Status: DC

## 2020-04-15 MED ORDER — GABAPENTIN 300 MG PO CAPS
ORAL_CAPSULE | ORAL | Status: AC
  Administered 2020-04-15: 300 mg via ORAL
  Filled 2020-04-15: qty 1

## 2020-04-15 MED ORDER — ACETAMINOPHEN 500 MG PO TABS: 1000.0000 mg | ORAL_TABLET | ORAL | Status: AC

## 2020-04-15 MED ORDER — CEFAZOLIN SODIUM-DEXTROSE 2-4 GM/100ML-% IV SOLN
2.0000 g | INTRAVENOUS | Status: AC
  Administered 2020-04-15: 2 g via INTRAVENOUS

## 2020-04-15 MED ORDER — BUPIVACAINE LIPOSOME 1.3 % IJ SUSP
INTRAMUSCULAR | Status: AC
  Filled 2020-04-15: qty 20

## 2020-04-15 MED ORDER — EPHEDRINE 5 MG/ML INJ
INTRAVENOUS | Status: AC
  Filled 2020-04-15: qty 10

## 2020-04-15 MED ORDER — FENTANYL CITRATE (PF) 100 MCG/2ML IJ SOLN: 25.0000 ug | INTRAMUSCULAR | Status: DC | PRN

## 2020-04-15 MED ORDER — OXYCODONE HCL 5 MG/5ML PO SOLN: 5.0000 mg | Freq: Once | ORAL | Status: DC | PRN

## 2020-04-15 MED ORDER — GABAPENTIN 300 MG PO CAPS: 300.0000 mg | ORAL_CAPSULE | ORAL | Status: AC

## 2020-04-15 MED ORDER — FAMOTIDINE 20 MG PO TABS
ORAL_TABLET | ORAL | Status: AC
  Administered 2020-04-15: 20 mg via ORAL
  Filled 2020-04-15: qty 1

## 2020-04-15 MED ORDER — PROMETHAZINE HCL 25 MG/ML IJ SOLN: 6.2500 mg | INTRAMUSCULAR | Status: DC | PRN

## 2020-04-15 MED ORDER — LACTATED RINGERS IV SOLN: INTRAVENOUS | Status: DC

## 2020-04-15 MED ORDER — OXYCODONE HCL 5 MG PO TABS
5.0000 mg | ORAL_TABLET | Freq: Four times a day (QID) | ORAL | 0 refills | Status: DC | PRN
Start: 2020-04-15 — End: 2020-04-29

## 2020-04-15 MED ORDER — LIDOCAINE-EPINEPHRINE 1 %-1:100000 IJ SOLN
INTRAMUSCULAR | Status: AC
  Filled 2020-04-15: qty 1

## 2020-04-15 MED ORDER — ONDANSETRON HCL 4 MG/2ML IJ SOLN
INTRAMUSCULAR | Status: DC | PRN
  Administered 2020-04-15: 4 mg via INTRAVENOUS

## 2020-04-15 MED ORDER — BUPIVACAINE LIPOSOME 1.3 % IJ SUSP
INTRAMUSCULAR | Status: DC | PRN
  Administered 2020-04-15: 20 mL

## 2020-04-15 MED ORDER — KETOROLAC TROMETHAMINE 30 MG/ML IJ SOLN
INTRAMUSCULAR | Status: AC
  Filled 2020-04-15: qty 1

## 2020-04-15 MED ORDER — LIDOCAINE-EPINEPHRINE 1 %-1:100000 IJ SOLN
INTRAMUSCULAR | Status: DC | PRN
  Administered 2020-04-15: 15 mL via INTRAMUSCULAR

## 2020-04-15 MED ORDER — BUPIVACAINE HCL (PF) 0.25 % IJ SOLN
INTRAMUSCULAR | Status: AC
  Filled 2020-04-15: qty 30

## 2020-04-15 MED ORDER — CHLORHEXIDINE GLUCONATE 0.12 % MT SOLN
OROMUCOSAL | Status: AC
  Filled 2020-04-15: qty 15

## 2020-04-15 MED ORDER — DEXAMETHASONE SODIUM PHOSPHATE 10 MG/ML IJ SOLN
INTRAMUSCULAR | Status: DC | PRN
  Administered 2020-04-15: 5 mg via INTRAVENOUS

## 2020-04-15 MED ORDER — LIDOCAINE HCL (PF) 2 % IJ SOLN
INTRAMUSCULAR | Status: AC
  Filled 2020-04-15: qty 5

## 2020-04-15 MED ORDER — ORAL CARE MOUTH RINSE: 15.0000 mL | Freq: Once | OROMUCOSAL | Status: AC

## 2020-04-15 MED ORDER — CHLORHEXIDINE GLUCONATE CLOTH 2 % EX PADS
6.0000 | MEDICATED_PAD | Freq: Once | CUTANEOUS | Status: AC
  Administered 2020-04-15: 6 via TOPICAL

## 2020-04-15 MED ORDER — ONDANSETRON HCL 4 MG/2ML IJ SOLN
INTRAMUSCULAR | Status: AC
  Filled 2020-04-15: qty 2

## 2020-04-15 MED ORDER — CEFAZOLIN SODIUM-DEXTROSE 2-4 GM/100ML-% IV SOLN
INTRAVENOUS | Status: AC
  Filled 2020-04-15: qty 100

## 2020-04-15 MED ORDER — CELECOXIB 200 MG PO CAPS
ORAL_CAPSULE | ORAL | Status: AC
  Administered 2020-04-15: 200 mg via ORAL
  Filled 2020-04-15: qty 1

## 2020-04-15 MED ORDER — PHENYLEPHRINE HCL (PRESSORS) 10 MG/ML IV SOLN
INTRAVENOUS | Status: DC | PRN
  Administered 2020-04-15 (×2): 100 ug via INTRAVENOUS

## 2020-04-15 MED ORDER — FENTANYL CITRATE (PF) 100 MCG/2ML IJ SOLN
INTRAMUSCULAR | Status: DC | PRN
  Administered 2020-04-15: 50 ug via INTRAVENOUS

## 2020-04-15 MED ORDER — FAMOTIDINE 20 MG PO TABS: 20.0000 mg | ORAL_TABLET | Freq: Once | ORAL | Status: AC

## 2020-04-15 MED ORDER — OXYCODONE HCL 5 MG PO TABS: 5.0000 mg | ORAL_TABLET | Freq: Once | ORAL | Status: DC | PRN

## 2020-04-15 MED ORDER — CELECOXIB 200 MG PO CAPS: 200.0000 mg | ORAL_CAPSULE | ORAL | Status: AC

## 2020-04-15 MED ORDER — LIDOCAINE HCL (CARDIAC) PF 100 MG/5ML IV SOSY
PREFILLED_SYRINGE | INTRAVENOUS | Status: DC | PRN
  Administered 2020-04-15: 80 mg via INTRAVENOUS

## 2020-04-15 MED ORDER — ACETAMINOPHEN 500 MG PO TABS
ORAL_TABLET | ORAL | Status: AC
  Administered 2020-04-15: 1000 mg via ORAL
  Filled 2020-04-15: qty 2

## 2020-04-15 MED ORDER — PROPOFOL 10 MG/ML IV BOLUS
INTRAVENOUS | Status: DC | PRN
  Administered 2020-04-15: 120 mg via INTRAVENOUS

## 2020-04-15 MED ORDER — CHLORHEXIDINE GLUCONATE 0.12 % MT SOLN
15.0000 mL | Freq: Once | OROMUCOSAL | Status: AC
  Administered 2020-04-15: 15 mL via OROMUCOSAL

## 2020-04-15 MED ORDER — CHLORHEXIDINE GLUCONATE CLOTH 2 % EX PADS: 6.0000 | MEDICATED_PAD | Freq: Once | CUTANEOUS | Status: DC

## 2020-04-15 MED ORDER — ROCURONIUM BROMIDE 100 MG/10ML IV SOLN
INTRAVENOUS | Status: DC | PRN
  Administered 2020-04-15: 50 mg via INTRAVENOUS

## 2020-04-15 MED ORDER — FENTANYL CITRATE (PF) 100 MCG/2ML IJ SOLN
INTRAMUSCULAR | Status: AC
  Filled 2020-04-15: qty 2

## 2020-04-15 MED ORDER — PHENYLEPHRINE HCL (PRESSORS) 10 MG/ML IV SOLN
INTRAVENOUS | Status: AC
  Filled 2020-04-15: qty 1

## 2020-04-15 MED ORDER — MEPERIDINE HCL 50 MG/ML IJ SOLN: 6.2500 mg | INTRAMUSCULAR | Status: DC | PRN

## 2020-04-15 MED ORDER — EPHEDRINE SULFATE 50 MG/ML IJ SOLN
INTRAMUSCULAR | Status: DC | PRN
  Administered 2020-04-15 (×3): 5 mg via INTRAVENOUS

## 2020-04-15 MED ORDER — ROCURONIUM BROMIDE 10 MG/ML (PF) SYRINGE
PREFILLED_SYRINGE | INTRAVENOUS | Status: AC
  Filled 2020-04-15: qty 10

## 2020-04-15 MED ORDER — PROPOFOL 10 MG/ML IV BOLUS
INTRAVENOUS | Status: AC
  Filled 2020-04-15: qty 20

## 2020-04-15 SURGICAL SUPPLY — 36 items
BACTOSHIELD CHG 4% 4OZ (MISCELLANEOUS)
BLADE SURG 15 STRL LF DISP TIS (BLADE) ×1 IMPLANT
BLADE SURG 15 STRL SS (BLADE) ×1
CANISTER SUCT 1200ML W/VALVE (MISCELLANEOUS) ×2 IMPLANT
CHLORAPREP W/TINT 26 (MISCELLANEOUS) ×2 IMPLANT
COVER WAND RF STERILE (DRAPES) ×2 IMPLANT
DECANTER SPIKE VIAL GLASS SM (MISCELLANEOUS) ×2 IMPLANT
DERMABOND ADVANCED (GAUZE/BANDAGES/DRESSINGS) ×1
DERMABOND ADVANCED .7 DNX12 (GAUZE/BANDAGES/DRESSINGS) ×1 IMPLANT
DRAPE LAPAROTOMY 77X122 PED (DRAPES) ×2 IMPLANT
DRAPE MAG INST 16X20 L/F (DRAPES) ×2 IMPLANT
ELECT CAUTERY BLADE TIP 2.5 (TIP) ×2
ELECT REM PT RETURN 9FT ADLT (ELECTROSURGICAL) ×2
ELECTRODE CAUTERY BLDE TIP 2.5 (TIP) ×1 IMPLANT
ELECTRODE REM PT RTRN 9FT ADLT (ELECTROSURGICAL) ×1 IMPLANT
GLOVE BIO SURGEON STRL SZ 6.5 (GLOVE) ×2 IMPLANT
GLOVE INDICATOR 7.0 STRL GRN (GLOVE) ×2 IMPLANT
GOWN STRL REUS W/ TWL LRG LVL3 (GOWN DISPOSABLE) ×2 IMPLANT
GOWN STRL REUS W/TWL LRG LVL3 (GOWN DISPOSABLE) ×2
NEEDLE HYPO 25X1 1.5 SAFETY (NEEDLE) ×2 IMPLANT
NS IRRIG 500ML POUR BTL (IV SOLUTION) ×2 IMPLANT
PACK BASIN MINOR (MISCELLANEOUS) ×2 IMPLANT
PENCIL ELECTRO HAND CTR (MISCELLANEOUS) ×2 IMPLANT
SCRUB CHG 4% DYNA-HEX 4OZ (MISCELLANEOUS) IMPLANT
SPONGE LAP 18X18 RF (DISPOSABLE) ×2 IMPLANT
STRIP CLOSURE SKIN 1/2X4 (GAUZE/BANDAGES/DRESSINGS) ×2 IMPLANT
SUT MNCRL 4-0 (SUTURE) ×1
SUT MNCRL 4-0 27XMFL (SUTURE) ×1
SUT SILK 2 0 SH (SUTURE) ×2 IMPLANT
SUT VIC AB 2-0 SH 27 (SUTURE) ×1
SUT VIC AB 2-0 SH 27XBRD (SUTURE) ×1 IMPLANT
SUT VIC AB 3-0 SH 27 (SUTURE) ×1
SUT VIC AB 3-0 SH 27X BRD (SUTURE) ×1 IMPLANT
SUTURE MNCRL 4-0 27XMF (SUTURE) ×1 IMPLANT
SYR 10ML LL (SYRINGE) ×2 IMPLANT
SYR BULB IRRIG 60ML STRL (SYRINGE) ×2 IMPLANT

## 2020-04-15 NOTE — Transfer of Care (Signed)
Immediate Anesthesia Transfer of Care Note  Patient: Morgan Livingston  Procedure(s) Performed: EXCISION LIPOMA, left arm (Left Arm Upper)  Patient Location: PACU  Anesthesia Type:General  Level of Consciousness: awake  Airway & Oxygen Therapy: Patient Spontanous Breathing  Post-op Assessment: Report given to RN  Post vital signs: stable  Last Vitals:  Vitals Value Taken Time  BP 118/83 04/15/20 1034  Temp    Pulse 67 04/15/20 1036  Resp 12 04/15/20 1036  SpO2 95 % 04/15/20 1036  Vitals shown include unvalidated device data.  Last Pain:  Vitals:   04/15/20 1034  TempSrc:   PainSc: (P) 0-No pain         Complications: No complications documented.

## 2020-04-15 NOTE — Interval H&P Note (Signed)
History and Physical Interval Note:  04/15/2020 8:52 AM  Morgan Livingston  has presented today for surgery, with the diagnosis of Left arm lipoma.  The various methods of treatment have been discussed with the patient and family. After consideration of risks, benefits and other options for treatment, the patient has consented to  Procedure(s): EXCISION LIPOMA, left arm (Left) as a surgical intervention.  The patient's history has been reviewed, patient examined, no change in status, stable for surgery.  I have reviewed the patient's chart and labs.  Questions were answered to the patient's satisfaction.     Fredirick Maudlin

## 2020-04-15 NOTE — Anesthesia Postprocedure Evaluation (Signed)
Anesthesia Post Note  Patient: Morgan Livingston  Procedure(s) Performed: EXCISION LIPOMA, left arm (Left Arm Upper)  Patient location during evaluation: PACU Anesthesia Type: General Level of consciousness: awake and alert and oriented Pain management: pain level controlled Vital Signs Assessment: post-procedure vital signs reviewed and stable Respiratory status: spontaneous breathing, nonlabored ventilation and respiratory function stable Cardiovascular status: blood pressure returned to baseline and stable Postop Assessment: no signs of nausea or vomiting Anesthetic complications: no   No complications documented.   Last Vitals:  Vitals:   04/15/20 1210 04/15/20 1307  BP: (P) 117/65 108/62  Pulse: (!) (P) 57 (!) 58  Resp: (P) 16   Temp:    SpO2: (P) 98% 98%    Last Pain:  Vitals:   04/15/20 1307  TempSrc: Temporal  PainSc:                  Anyely Cunning

## 2020-04-15 NOTE — Op Note (Addendum)
Operative Note  Preoperative Diagnosis: Left shoulder mass  Postoperative Diagnosis: Same, likely lipoma  Operation: Excision of partially subfascial left shoulder mass greater than 5 cm, layered wound closure 20 cm in length.  Surgeon: Fredirick Maudlin, MD  Assistant: None  Anesthesia: General endotracheal  Findings: Well encapsulated fatty tissue that was both subcutaneous as well as partially subfascial/intramuscular.  Intraoperative appearance was most consistent with a benign lipoma.  Indications: This is a 74 year old woman who has had a small lump on her left shoulder/upper arm for many years.  More recently, it has grown substantially and is causing her discomfort.  Clinic evaluation was consistent with a lipoma.  She desired surgical resection.  The risks of the operation were discussed with her and she agreed to proceed.  Procedure In Detail: The patient was identified in the preoperative holding area where her left shoulder and upper arm were marked.  She was then brought to the operating room and placed supine on the OR table.  All bony prominences were padded and bilateral sequential compression devices were placed on the lower extremities.  General endotracheal anesthesia was induced without incident.  The patient was then positioned appropriately for the operation and sterilely prepped and draped in standard fashion.  A time was performed confirming the patient's identity, the procedure being performed, her allergies, all necessary equipment was available, and that maintenance anesthesia was adequate.  Perioperative antibiotics were administered.  A longitudinal incision was made overlying the mass.  This was carried down through the subcutaneous tissues using electrocautery.  There was a distinct change in the quality of the fat indicating the start of the lipoma.  It was circumferentially dissected away from the surrounding tissues.  There was a portion near the distal aspect that  intercalated into the muscles of the upper arm.  This was carefully dissected away without resecting much muscle.  At the more proximal end, the tissue was a bit more fibrous, which had also been appreciated on physical exam.  The mass was then completely and circumferentially excised.  It was marked with a short stitch at the superior margin, a long stitch at the lateral margin, and a double stitch at the deep margin.  The wound bed was irrigated and good hemostasis was achieved.  Liposomal bupivacaine was infiltrated in the tissue surrounding the wound and in the wound bed itself.  The wound was then closed in 3 separate layers.  2-0 Vicryl was used to reapproximate the deep layer followed by 3-0 Vicryl in the dermis and a running subcuticular Monocryl.  The skin was cleaned.  Dermabond was applied, followed by a sterile dressing.  The patient was then awakened, extubated, and taken to the postanesthesia care unit in good condition.  EBL: Less than 1 cc  IVF: See anesthesia record  Specimen(s): Left shoulder mass to pathology  Complications: none immediately apparent.   Counts: all needles, instruments, and sponges were counted and reported to be correct in number at the end of the case.   I was present for and participated in the entire operation.  Fredirick Maudlin 10:48 AM

## 2020-04-15 NOTE — Anesthesia Procedure Notes (Signed)
Procedure Name: Intubation Date/Time: 04/15/2020 9:16 AM Performed by: Zetta Bills, CRNA Pre-anesthesia Checklist: Patient identified, Emergency Drugs available, Suction available, Patient being monitored and Timeout performed Patient Re-evaluated:Patient Re-evaluated prior to induction Oxygen Delivery Method: Circle system utilized Preoxygenation: Pre-oxygenation with 100% oxygen Induction Type: IV induction Ventilation: Mask ventilation without difficulty Laryngoscope Size: Mac and 3 Tube type: Oral Tube size: 7.0 mm Number of attempts: 1 Airway Equipment and Method: Stylet Placement Confirmation: ETT inserted through vocal cords under direct vision,  positive ETCO2 and breath sounds checked- equal and bilateral Secured at: 22 cm Tube secured with: Tape Dental Injury: Teeth and Oropharynx as per pre-operative assessment

## 2020-04-15 NOTE — Anesthesia Preprocedure Evaluation (Signed)
Anesthesia Evaluation  Patient identified by MRN, date of birth, ID band Patient awake    Reviewed: Allergy & Precautions, NPO status , Patient's Chart, lab work & pertinent test results  History of Anesthesia Complications Negative for: history of anesthetic complications  Airway Mallampati: II  TM Distance: >3 FB Neck ROM: Full    Dental  (+) Missing   Pulmonary neg sleep apnea, neg COPD, former smoker,    breath sounds clear to auscultation- rhonchi (-) wheezing      Cardiovascular hypertension, Pt. on medications (-) CAD, (-) Past MI, (-) Cardiac Stents and (-) CABG  Rhythm:Regular Rate:Normal - Systolic murmurs and - Diastolic murmurs    Neuro/Psych neg Seizures negative neurological ROS  negative psych ROS   GI/Hepatic negative GI ROS, Neg liver ROS,   Endo/Other  negative endocrine ROSneg diabetes  Renal/GU CRFRenal disease (hx of nephrolithiasis)     Musculoskeletal  (+) Arthritis ,   Abdominal (+) - obese,   Peds  Hematology negative hematology ROS (+)   Anesthesia Other Findings Past Medical History: No date: Allergy No date: [facility])     Comment:  Uterine Cancer No date: Hyperlipidemia No date: Hypertension 06/21/2018: Kidney stones   Reproductive/Obstetrics                             Anesthesia Physical Anesthesia Plan  ASA: II  Anesthesia Plan: General   Post-op Pain Management:    Induction: Intravenous  PONV Risk Score and Plan: 2 and Ondansetron and Dexamethasone  Airway Management Planned: Oral ETT  Additional Equipment:   Intra-op Plan:   Post-operative Plan: Extubation in OR  Informed Consent: I have reviewed the patients History and Physical, chart, labs and discussed the procedure including the risks, benefits and alternatives for the proposed anesthesia with the patient or authorized representative who has indicated his/her understanding and  acceptance.     Dental advisory given  Plan Discussed with: CRNA and Anesthesiologist  Anesthesia Plan Comments:         Anesthesia Quick Evaluation

## 2020-04-15 NOTE — Discharge Instructions (Signed)

## 2020-04-16 ENCOUNTER — Encounter: Payer: Self-pay | Admitting: General Surgery

## 2020-04-19 LAB — SURGICAL PATHOLOGY

## 2020-04-29 ENCOUNTER — Other Ambulatory Visit: Payer: Self-pay

## 2020-04-29 ENCOUNTER — Ambulatory Visit (INDEPENDENT_AMBULATORY_CARE_PROVIDER_SITE_OTHER): Payer: Self-pay | Admitting: Physician Assistant

## 2020-04-29 ENCOUNTER — Encounter: Payer: Self-pay | Admitting: Physician Assistant

## 2020-04-29 VITALS — BP 143/82 | HR 78 | Temp 97.3°F | Resp 12 | Ht 65.0 in | Wt 173.2 lb

## 2020-04-29 DIAGNOSIS — Z09 Encounter for follow-up examination after completed treatment for conditions other than malignant neoplasm: Secondary | ICD-10-CM

## 2020-04-29 DIAGNOSIS — R2232 Localized swelling, mass and lump, left upper limb: Secondary | ICD-10-CM

## 2020-04-29 NOTE — Patient Instructions (Signed)
Follow up as needed, call the office if you have any questions or concerns.   Lipoma Removal  Lipoma removal is a surgical procedure to remove a lipoma, which is a noncancerous (benign) tumor that is made up of fat cells. Most lipomas are small and painless and do not require treatment. They can form in many areas of the body but are most common under the skin of the back, arms, shoulders, buttocks, and thighs. You may need lipoma removal if you have a lipoma that is large, growing, or causing discomfort. Lipoma removal may also be done for cosmetic reasons. Tell a health care provider about:  Any allergies you have.  All medicines you are taking, including vitamins, herbs, eye drops, creams, and over-the-counter medicines.  Any problems you or family members have had with anesthetic medicines.  Any blood disorders you have.  Any surgeries you have had.  Any medical conditions you have.  Whether you are pregnant or may be pregnant. What are the risks? Generally, this is a safe procedure. However, problems may occur, including:  Infection.  Bleeding.  Scarring.  Allergic reactions to medicines.  Damage to nearby structures or organs, such as damage to nerves or blood vessels near the lipoma. What happens before the procedure? Staying hydrated Follow instructions from your health care provider about hydration, which may include:  Up to 2 hours before the procedure - you may continue to drink clear liquids, such as water, clear fruit juice, black coffee, and plain tea. Eating and drinking restrictions Follow instructions from your health care provider about eating and drinking, which may include:  8 hours before the procedure - stop eating heavy meals or foods, such as meat, fried foods, or fatty foods.  6 hours before the procedure - stop eating light meals or foods, such as toast or cereal.  6 hours before the procedure - stop drinking milk or drinks that contain  milk.  2 hours before the procedure - stop drinking clear liquids. Medicines Ask your health care provider about:  Changing or stopping your regular medicines. This is especially important if you are taking diabetes medicines or blood thinners.  Taking medicines such as aspirin and ibuprofen. These medicines can thin your blood. Do not take these medicines unless your health care provider tells you to take them.  Taking over-the-counter medicines, vitamins, herbs, and supplements. General instructions  You will have a physical exam. Your health care provider will check the size of the lipoma and whether it can be moved easily.  You may have a biopsy and imaging tests, such as X-rays, a CT scan, and an MRI.  Do not use any products that contain nicotine or tobacco for at least 4 weeks before the procedure. These products include cigarettes, e-cigarettes, and chewing tobacco. If you need help quitting, ask your health care provider.  Ask your health care provider: ? How your surgery site will be marked. ? What steps will be taken to help prevent infection. These may include:  Washing skin with a germ-killing soap.  Taking antibiotic medicine.  Plan to have someone take you home from the hospital or clinic.  If you will be going home right after the procedure, plan to have someone with you for 24 hours. What happens during the procedure?   An IV will be inserted into one of your veins.  You will be given one or more of the following: ? A medicine to help you relax (sedative). ? A medicine to numb  the area (local anesthetic). ? A medicine to make you fall asleep (general anesthetic). ? A medicine that is injected into an area of your body to numb everything below the injection site (regional anesthetic).  An incision will be made over the lipoma or very near the lipoma. The incision may be made in a natural skin line or crease.  Tissues, nerves, and blood vessels near the  lipoma will be moved out of the way.  The lipoma and the capsule that surrounds it will be separated from the surrounding tissues.  The lipoma will be removed.  The incision may be closed with stitches (sutures).  A bandage (dressing) will be placed over the incision. The procedure may vary among health care providers and hospitals. What happens after the procedure?  Your blood pressure, heart rate, breathing rate, and blood oxygen level will be monitored until you leave the hospital or clinic.  If you were prescribed an antibiotic medicine, use it as told by your health care provider. Do not stop using the antibiotic even if you start to feel better.  If you were given a sedative during the procedure, it can affect you for several hours. Do not drive or operate machinery until your health care provider says that it is safe. Summary  Before the procedure, follow instructions from your health care provider about eating and drinking, and changing or stopping your regular medicines. This is especially important if you are taking diabetes medicines or blood thinners.  After the lipoma is removed, the incision may be closed with stitches (sutures) and covered with a bandage (dressing).  If you were given a sedative during the procedure, it can affect you for several hours. Do not drive or operate machinery until your health care provider says that it is safe. This information is not intended to replace advice given to you by your health care provider. Make sure you discuss any questions you have with your health care provider. Document Revised: 06/08/2019 Document Reviewed: 06/08/2019 Elsevier Patient Education  [place].

## 2020-04-29 NOTE — Progress Notes (Signed)
[facility] POST-OP OFFICE VISIT  04/29/2020  HPI: Morgan Livingston is a 74 y.o. female 14 days s/p excision of left shoulder mass, proven to be lipoma on pathology, with Dr Celine Ahr.   Doing well, no pain, fever, chills\ She did report superficial numbness to the medial portion of her incisions, improved, no distal paresthesias or weakness Incision is healing well No other complaints   Vital signs: BP (!) 143/82   Pulse 78   Temp (!) 97.3 F (36.3 C)   Resp 12   Ht 5\' 5"  (1.651 m)   Wt 173 lb 3.2 oz (78.6 kg)   SpO2 99%   BMI 28.82 kg/m    Physical Exam: Constitutional: Well appearing female, NAD Skin: vertical incision to the lateral left upper arm is well healed, no erythema or drainage  Assessment/Plan: This is a 74 y.o. female 14 days s/p excision of left shoulder mass, proven to be lipoma on pathology   - Pain control prn  - Reviewed wound care; no issues  - Reviewed pathology: Lipoma  - rtc prn  -- Edison Simon, PA-C [facility] 04/29/2020, 10:36 AM [phone] M-F: 7am - 4pm

## 2020-05-10 IMAGING — CT CT ABD-PELV W/ CM
2 of 5 series · 16 of 46 positions shown, 18 images · IV contrast (APPLIED)
Comparison: CT abdomen pelvis 04/09/2015

CLINICAL DATA: Abdominal pain

EXAM:
CT ABDOMEN AND PELVIS WITH CONTRAST
TECHNIQUE: Multidetector CT imaging of the abdomen and pelvis was performed
using the standard protocol following bolus administration of
intravenous contrast.
CONTRAST:  100mL MWPHHW-Z00 IOPAMIDOL (MWPHHW-Z00) INJECTION 61%

[Series 2: routine abd/pel with · axial · 0.70mm/px · z∈[-313,+82]mm · 13 of 89 slices shown, 15 images]
[im 5/89  soft-tissue]
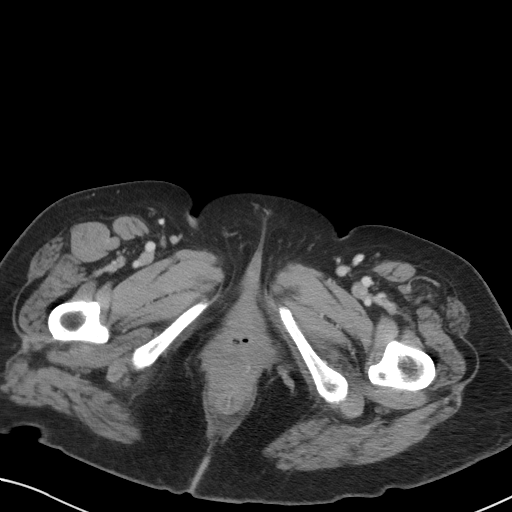
[im 5/89  bone]
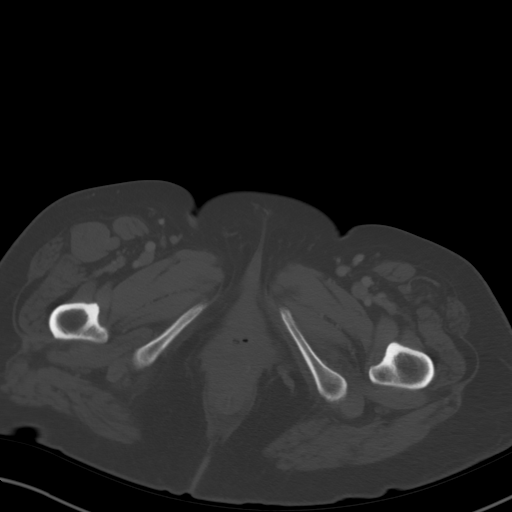
[im 14/89  soft-tissue]
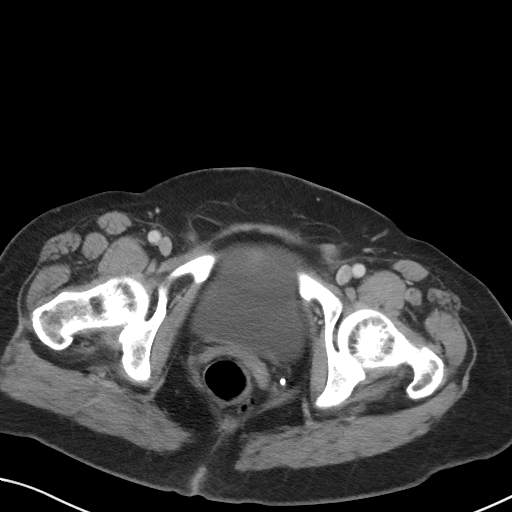
[im 18/89  soft-tissue]
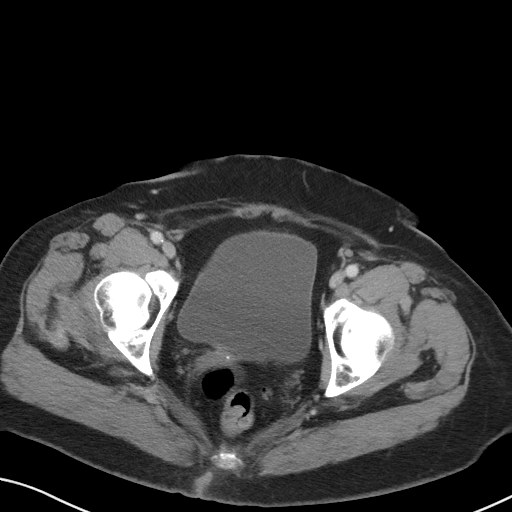
[im 27/89  soft-tissue]
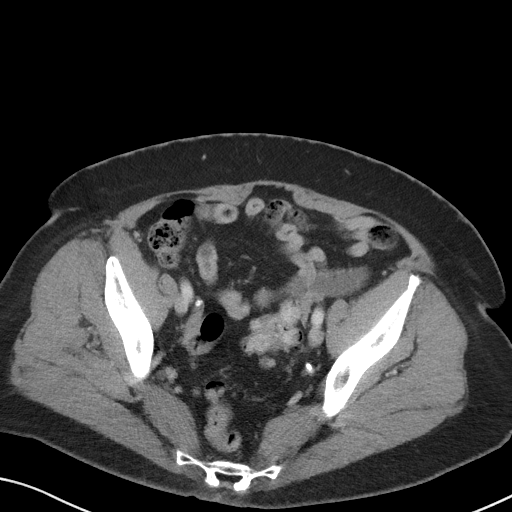
[im 31/89  soft-tissue]
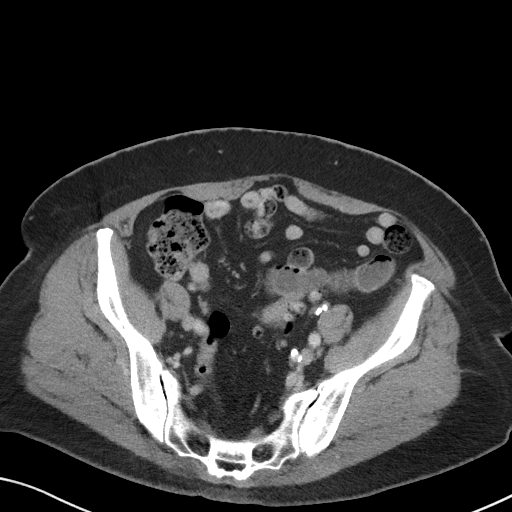
[im 40/89  soft-tissue]
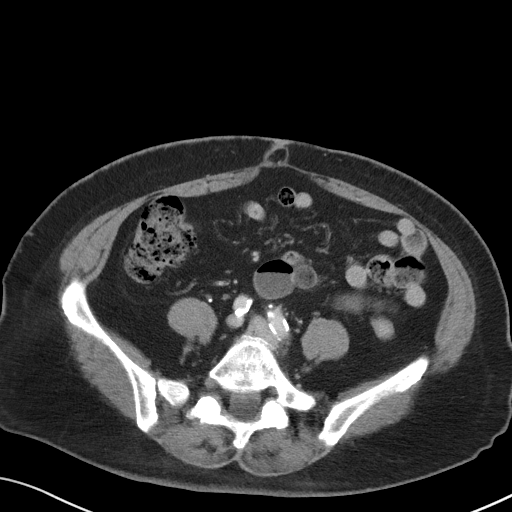
[im 45/89  soft-tissue]
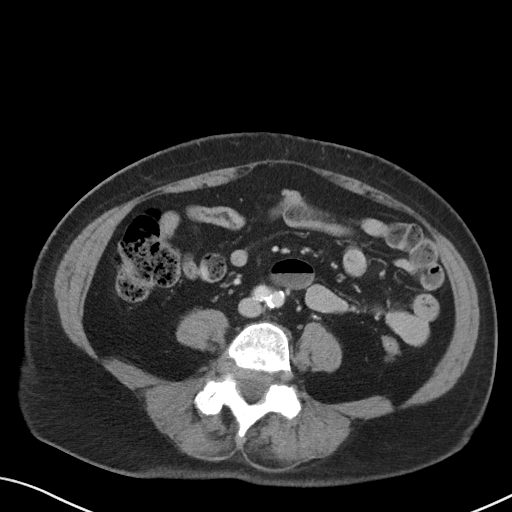
[im 49/89  soft-tissue]
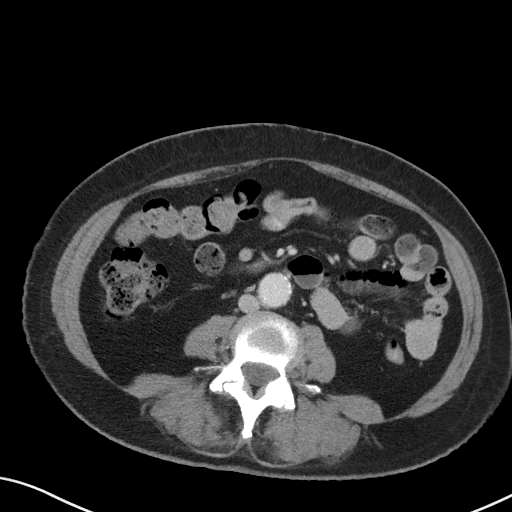
[im 58/89  soft-tissue]
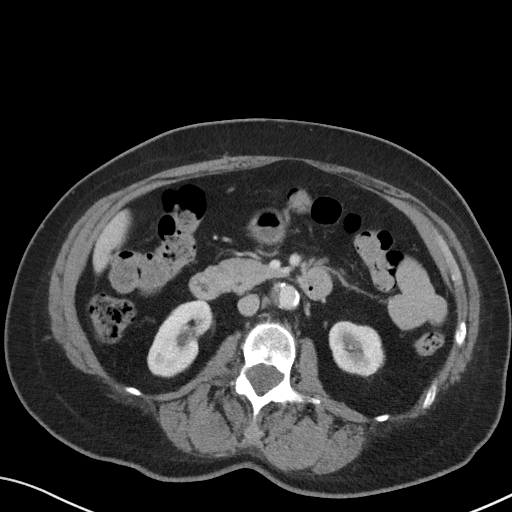
[im 58/89  bone]
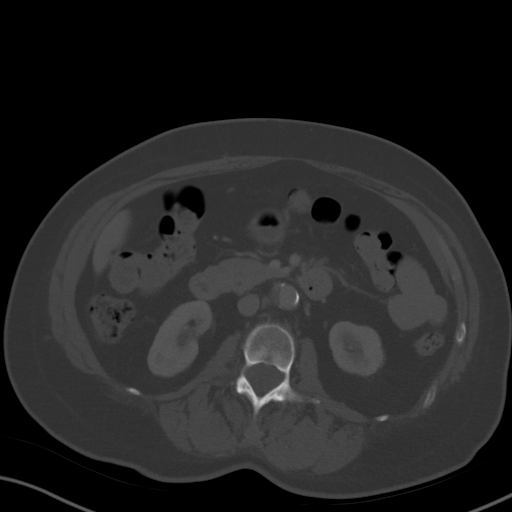
[im 62/89  soft-tissue]
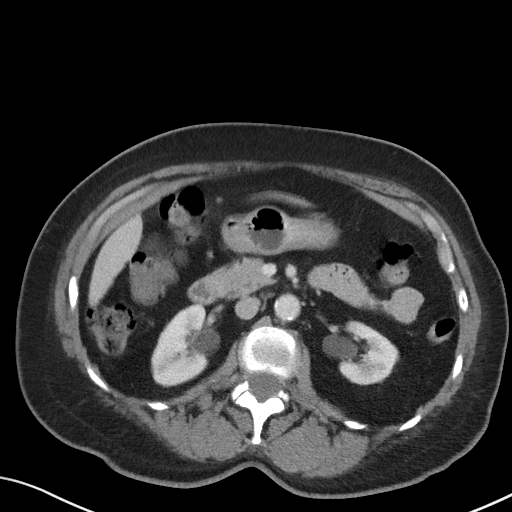
[im 71/89  soft-tissue]
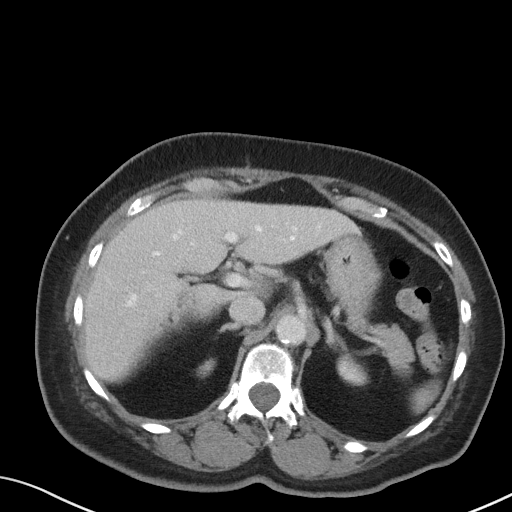
[im 75/89  soft-tissue]
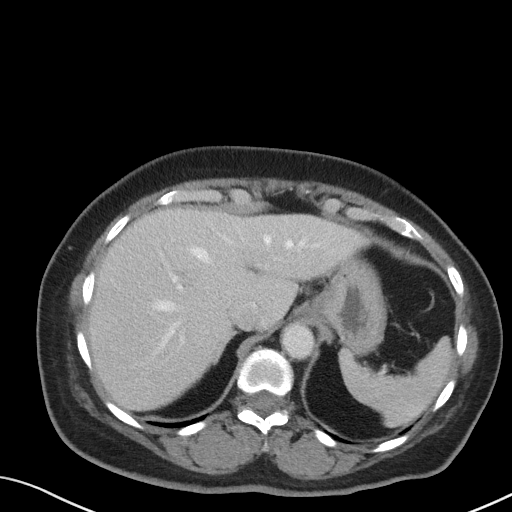
[im 84/89  soft-tissue]
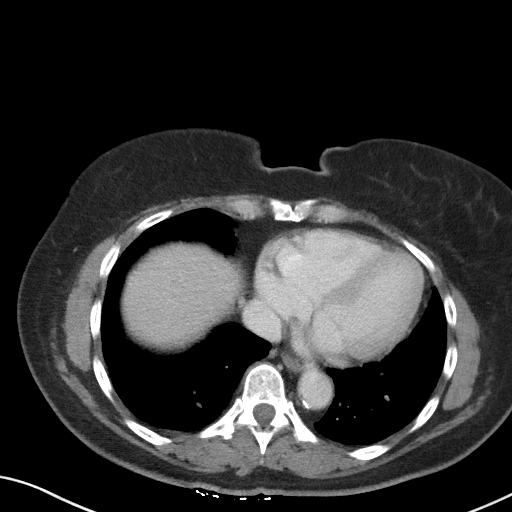

[Series 5: coronal st · coronal · 0.82mm/px · 3 of 86 slices shown]
[im 29/86  soft-tissue]
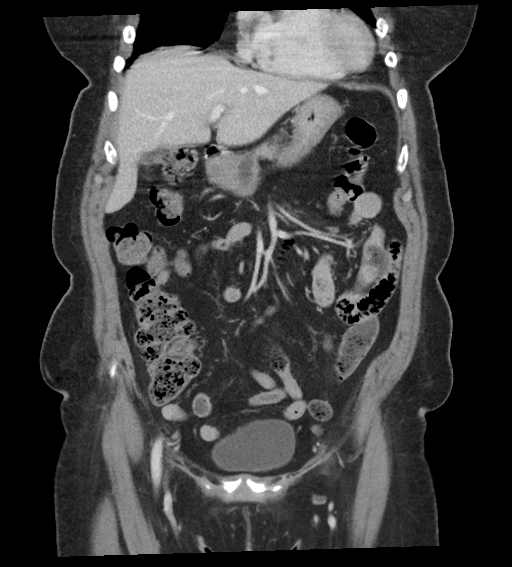
[im 38/86  soft-tissue]
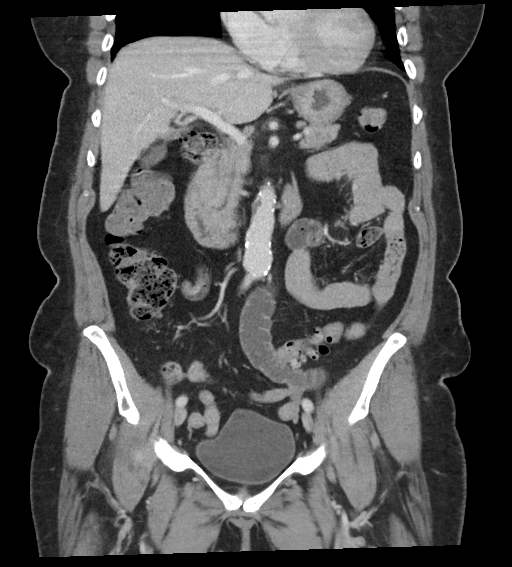
[im 48/86  soft-tissue]
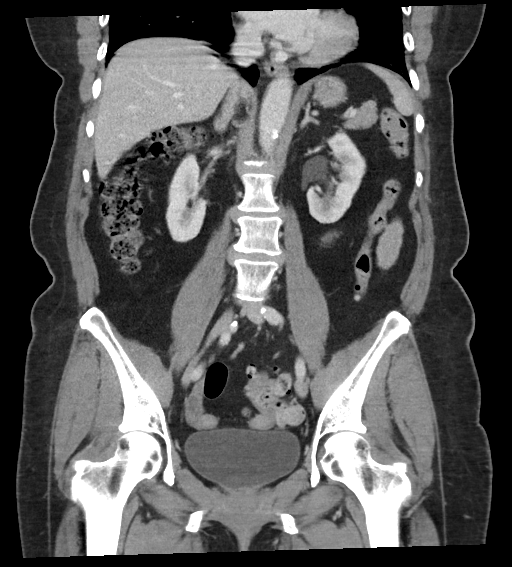

[16 of 46 positions shown; findings below may reference images not displayed]

FINDINGS: LOWER CHEST: There is no basilar pleural or apical pericardial
effusion.

HEPATOBILIARY: The hepatic contours and density are normal. There is
no intra- or extrahepatic biliary dilatation. The gallbladder is
normal.

PANCREAS: The pancreatic parenchymal contours are normal and there
is no ductal dilatation. There is no peripancreatic fluid
collection.

SPLEEN: Normal.

ADRENALS/URINARY TRACT:

--Adrenal glands: Normal.

--Right kidney/ureter: No hydronephrosis, nephroureterolithiasis,
perinephric stranding or solid renal mass.

--Left kidney/ureter: No hydronephrosis, nephroureterolithiasis,
perinephric stranding or solid renal mass.

--Urinary bladder: Normal for degree of distention

STOMACH/BOWEL:

--Stomach/Duodenum: There is no hiatal hernia or other gastric
abnormality. The duodenal course and caliber are normal.

--Small bowel: No dilatation or inflammation.

--Colon: Rectosigmoid diverticulosis without acute inflammation.

--Appendix: Not visualized. No right lower quadrant inflammation or
free fluid.

VASCULAR/LYMPHATIC: Atherosclerotic calcification is present within
the non-aneurysmal abdominal aorta, without hemodynamically
significant stenosis. The portal vein, splenic vein, superior
mesenteric vein and IVC are patent. No abdominal or pelvic
lymphadenopathy.

REPRODUCTIVE: Status post hysterectomy. No adnexal mass.

MUSCULOSKELETAL. No bony spinal canal stenosis or focal osseous
abnormality.

OTHER: None.
IMPRESSION: 1. No acute abdominal or pelvic abnormality.
2. Sigmoid diverticulosis without acute inflammation.

Aortic atherosclerosis ([id number]-YG4.4).

## 2020-05-27 DIAGNOSIS — I10 Essential (primary) hypertension: Secondary | ICD-10-CM | POA: Diagnosis not present

## 2020-05-27 DIAGNOSIS — R809 Proteinuria, unspecified: Secondary | ICD-10-CM | POA: Diagnosis not present

## 2020-05-27 DIAGNOSIS — N1832 Chronic kidney disease, stage 3b: Secondary | ICD-10-CM | POA: Diagnosis not present

## 2020-05-27 DIAGNOSIS — N2581 Secondary hyperparathyroidism of renal origin: Secondary | ICD-10-CM | POA: Diagnosis not present

## 2020-06-02 DIAGNOSIS — R002 Palpitations: Secondary | ICD-10-CM | POA: Diagnosis not present

## 2020-06-02 DIAGNOSIS — I10 Essential (primary) hypertension: Secondary | ICD-10-CM | POA: Diagnosis not present

## 2020-06-02 DIAGNOSIS — R42 Dizziness and giddiness: Secondary | ICD-10-CM | POA: Diagnosis not present

## 2020-06-02 DIAGNOSIS — R9431 Abnormal electrocardiogram [ECG] [EKG]: Secondary | ICD-10-CM | POA: Diagnosis not present

## 2020-06-02 DIAGNOSIS — E782 Mixed hyperlipidemia: Secondary | ICD-10-CM | POA: Diagnosis not present

## 2020-06-02 DIAGNOSIS — N183 Chronic kidney disease, stage 3 unspecified: Secondary | ICD-10-CM | POA: Diagnosis not present

## 2020-06-02 DIAGNOSIS — J329 Chronic sinusitis, unspecified: Secondary | ICD-10-CM | POA: Diagnosis not present

## 2020-06-03 ENCOUNTER — Encounter: Payer: Self-pay | Admitting: Internal Medicine

## 2020-06-03 ENCOUNTER — Other Ambulatory Visit: Payer: Self-pay

## 2020-06-03 ENCOUNTER — Ambulatory Visit: Payer: Medicare PPO | Admitting: Internal Medicine

## 2020-06-03 VITALS — BP 110/70 | HR 82 | Temp 98.2°F | Resp 16 | Ht 60.0 in | Wt 172.6 lb

## 2020-06-03 DIAGNOSIS — R1084 Generalized abdominal pain: Secondary | ICD-10-CM | POA: Diagnosis not present

## 2020-06-03 DIAGNOSIS — Z8719 Personal history of other diseases of the digestive system: Secondary | ICD-10-CM

## 2020-06-03 MED ORDER — DICYCLOMINE HCL 10 MG PO CAPS: 10.0000 mg | ORAL_CAPSULE | Freq: Three times a day (TID) | ORAL | 2 refills | Status: DC

## 2020-06-03 NOTE — Progress Notes (Signed)
Patient ID: Morgan Livingston, female    DOB: 03/05/1946, 74 y.o.   MRN: [id number]  PCP: Steele Sizer, MD  Chief Complaint  Patient presents with  . Stomach cramping    Started last friday, after eating her stomach started cramping, starting yesterday she threw up her breakfast and stomach was cramping all day  . Nausea    Subjective:   Morgan Livingston is a 74 y.o. female, presents to clinic with CC of the following:  Chief Complaint  Patient presents with  . Stomach cramping    Started last friday, after eating her stomach started cramping, starting yesterday she threw up her breakfast and stomach was cramping all day  . Nausea    HPI:  Patient is a 74 year old female patient of Dr. Ancil Boozer Last visit with her was in June with that note reviewed. She followed up with nephrology a week ago, with her chronic kidney disease noted to be stable. She also had a recent lipoma removed from her shoulder. She follows up today with stomach cramping and nausea.  She noted she has a history of irritable bowel, and chronic constipation, and at one point was on Linzess.  She is also had stomach infections before including H. Pylori. She notes that starting this past Friday, she has had increased stomach cramping, mainly after food hits her stomach.  She states she went to her doctor's appointment Friday, usually takes her medicines in the morning, although at that time she ate and then went to her doctor's appointment, and then took her medicines when she got home.  And wonders if that contributed to her symptoms starting.  Her symptoms have continued since then, although not all the time.  She has not noticed certain foods that seem to the cause, and does not happen only at certain times of the day.  When it does occur, she states her stomach feels like it swells up at times in addition to cramping, and then over time resides.  Yesterday, she vomited 1 time, no blood with the vomiting.  That  was the only vomiting episode.  She states she has been trying to eat healthy, not challenging her gut with newer foods. She denies any fevers, has had no diarrhea.  No black stools, no bleeding per rectum. Denied any recent new or medicine she has been taking. Denied any prior abdominal surgeries, she did have a hysterectomy in her past.  Patient Active Problem List   Diagnosis Date Noted  . Mass of arm, left   . MGUS (monoclonal gammopathy of unknown significance) 09/01/2018  . Secondary hyperparathyroidism ([place]) 09/01/2018  . B12 deficiency 08/19/2018  . Atherosclerosis of aorta ([place]) 07/25/2018  . Thrombocytopenia ([place]) 05/09/2018  . Proteinuria 05/09/2018  . Positive ANA (antinuclear antibody) 04/02/2018  . Bilateral dry eyes 04/02/2018  . History of hysterectomy 05/23/2015  . Chronic kidney disease, stage III (moderate) 05/23/2015  . Benign hypertension 05/22/2015  . Chronic constipation 05/22/2015  . DDD (degenerative disc disease), lumbar 05/22/2015  . DD (diverticular disease) 05/22/2015  . Dyslipidemia 05/22/2015  . History of cervical cancer 05/22/2015  . Helicobacter pylori gastrointestinal tract infection 05/22/2015  . Blood glucose elevated 05/22/2015  . Overweight (BMI 25.0-29.9) 05/22/2015  . Perennial allergic rhinitis with seasonal variation 05/22/2015  . Vitamin D deficiency 05/22/2015  . Impingement syndrome of shoulder 01/14/2015      Current Outpatient Medications:  .  acetaminophen (TYLENOL) 500 MG tablet, Take 2 tablets (1,000 mg total)  by mouth every 6 (six) hours as needed for mild pain or headache., Disp: 30 tablet, Rfl: 0 .  aspirin 81 MG tablet, Take 81 mg by mouth daily., Disp: , Rfl:  .  Biotin 5 MG CAPS, Take by mouth daily., Disp: , Rfl:  .  carboxymethylcellulose (REFRESH PLUS) 0.5 % SOLN, Place 1 drop into both eyes daily., Disp: , Rfl:  .  cholecalciferol (VITAMIN D) 1000 UNITS tablet, Take 1,000 Units by mouth daily. , Disp: , Rfl:  .   fexofenadine (ALLEGRA) 180 MG tablet, Take 180 mg by mouth daily., Disp: , Rfl:  .  fluticasone (FLONASE) 50 MCG/ACT nasal spray, SPRAY 2 SPRAYS INTO EACH NOSTRIL EVERY DAY (Patient taking differently: Place 2 sprays into both nostrils daily as needed for rhinitis. ), Disp: 48 g, Rfl: 1 .  losartan-hydrochlorothiazide (HYZAAR) 100-25 MG tablet, Take 1 tablet by mouth daily., Disp: 90 tablet, Rfl: 1 .  rosuvastatin (CRESTOR) 20 MG tablet, Take 1 tablet (20 mg total) by mouth daily. (Patient taking differently: Take 20 mg by mouth at bedtime. ), Disp: 90 tablet, Rfl: 1 .  vitamin B-12 (CYANOCOBALAMIN) 1000 MCG tablet, Take 1 tablet (1,000 mcg total) by mouth daily. (Patient taking differently: Take 1,000 mcg by mouth once a week. Wednesday), Disp: 90 tablet, Rfl: 1 .  vitamin E 1000 UNIT capsule, Take 1,000 Units by mouth 3 (three) times a week., Disp: , Rfl:    No Known Allergies   Past Surgical History:  Procedure Laterality Date  . ABDOMINAL HYSTERECTOMY    . BREAST BIOPSY Right 04/09/2006   negative  . BREAST BIOPSY Left 12/04/2017   fibroadenmatoid change with coarse calcs  . COLONOSCOPY WITH PROPOFOL N/A 10/07/2018   Procedure: COLONOSCOPY WITH PROPOFOL;  Surgeon: [name], MD;  Location: [facility] ENDOSCOPY;  Service: Gastroenterology;  Laterality: N/A;  . LIPOMA EXCISION Left 04/15/2020   Procedure: EXCISION LIPOMA, left arm;  Surgeon: Fredirick Maudlin, MD;  Location: [facility] ORS;  Service: General;  Laterality: Left;     Family History  Problem Relation Age of Onset  . Congestive Heart Failure Mother 9  . Heart attack Father 69  . Sinusitis Sister   . Stomach cancer Sister   . Uterine cancer Sister 5  . Liver cancer Sister   . Pancreatic cancer Sister   . Dementia Sister        96  . Breast cancer Neg Hx      Social History   Tobacco Use  . Smoking status: Former Smoker    Packs/day: 1.00    Years: 2.00    Pack years: 2.00    Types: Cigarettes    Quit date: 1963     Years since quitting: 58.6  . Smokeless tobacco: Never Used  . Tobacco comment: smoking cessation materials not required  Substance Use Topics  . Alcohol use: No    Alcohol/week: 0.0 standard drinks    With staff assistance, above reviewed with the patient today.  ROS: As per HPI, otherwise no specific complaints on a limited and focused system review   No results found for this or any previous visit (from the past 72 hour(s)).   PHQ2/9: Depression screen [facility] Hights 2/9 06/03/2020 04/11/2020 12/29/2019 12/04/2019 09/02/2019  Decreased Interest 0 0 0 0 0  Down, Depressed, Hopeless 0 0 0 0 0  PHQ - 2 Score 0 0 0 0 0  Altered sleeping 0 0 - 0 0  Tired, decreased energy 0 0 - 0 0  Change  in appetite 0 0 - 0 0  Feeling bad or failure about yourself  0 0 - 0 0  Trouble concentrating 0 0 - 0 0  Moving slowly or fidgety/restless 0 0 - 0 0  Suicidal thoughts 0 0 - 0 0  PHQ-9 Score 0 0 - 0 0  Difficult doing work/chores Not difficult at all - - Not difficult at all -  Some recent data might be hidden   PHQ-2/9 Result is neg  Fall Risk: Fall Risk  06/03/2020 04/29/2020 04/12/2020 04/12/2020 04/11/2020  Falls in the past year? 0 0 0 0 0  Number falls in past yr: 0 0 0 0 0  Injury with Fall? 0 0 0 0 0  Risk for fall due to : - - - - -  Follow up - - - - -      Objective:   Vitals:   06/03/20 1349  BP: 110/70  Pulse: 82  Resp: 16  Temp: 98.2 F (36.8 C)  TempSrc: Temporal  SpO2: 100%  Weight: 172 lb 9.6 oz (78.3 kg)  Height: 5' (1.524 m)    Body mass index is 33.71 kg/m.  Physical Exam   NAD, masked, pleasant, looks well HEENT - [place]/AT, sclera anicteric, PERRL, EOMI, conj - non-inj'ed, pharynx clear Neck - supple, no adenopathy,  Car - RRR without m/g/r, not tachycardic Pulm- RR and effort normal at rest, CTA without wheeze or rales Abd - soft, NT diffusely, nondistended, bowel sounds positive, she stated it felt "sensitive "with palpation diffusely but not painful, no rebound or  guarding, mildly obese, positive striae, no masses, no obvious HSM Back - no CVA tenderness (and denies any dysuria or urinary symptoms of concern) Ext - no LE edema,  Neuro/psychiatric - affect was not flat, appropriate with conversation  Alert with normal speech  Results for orders placed or performed during the hospital encounter of 04/15/20  Surgical pathology  Result Value Ref Range   SURGICAL PATHOLOGY      SURGICAL PATHOLOGY CASE: [id number] PATIENT: Morgan Livingston Surgical Pathology Report     Specimen Submitted: A. Left arm mass  Clinical History: Left arm lipoma.      DIAGNOSIS: A.  SOFT TISSUE MASS, LEFT ARM; EXCISION: - MATURE ADIPOSE TISSUE CONSISTENT WITH LIPOMA.  GROSS DESCRIPTION: A. Labeled: Left arm mass Received: In formalin Tissue fragment(s): 1 Size: 10.7 x 9.2 x 3.2 cm Description: A lobulated piece of fibrofatty tissue with orienting sutures designated short stitch superior, long stitch lateral, double stitch deep.  Serial sectioning through the tissue reveals a homogeneous yellow fatty cut surface. Anterior = green Inferior = blue Lateral = orange Medial = yellow Posterior = black Superior = red  Representative sections submitted in 6 cassettes (2 pieces per cassette).   Final Diagnosis performed by Quay Burow, MD.   Electronically signed 04/19/2020 9:42:12AM The electronic signature indicates that the named Attending Patholog ist has evaluated the specimen Technical component performed at [facility], [address] [place], [place] [address] Lab: [phone] Dir: Rush Farmer, MD, MMM  Professional component performed at [facility], [facility], [place], [place], [place] [address] Lab: [phone] Dir: Dellia Nims. Reuel Derby, MD        Assessment & Plan:   1. Generalized abdominal cramping 2. History of IBS  Not sure of the exact source of her abdominal cramping, which is intermittent presently.  Exam  unremarkable today.  She does have a history of IBS, and may be related to that.  Discussed possible spasm as a source.  She has no heartburn or reflux symptoms.  No fever or diarrhea to suggest a gastroenteritis.  Doubt any obstructive concern.  He did have that one episode of vomiting yesterday but has not recurred. Discussed options, and will try Bentyl product-10 mg 4 times daily and assess her response. Continue to stay bland with her diet and eating healthy, not trying new fat foods to challenge her gut at all presently Need to closely monitor, and if she develops morning symptoms of concern such as increasing pains, vomiting, diarrhea, or other symptoms of concern, she needs to follow-up. She was understanding of that.  - dicyclomine (BENTYL) 10 MG capsule; Take 1 capsule (10 mg total) by mouth 4 (four) times daily -  before meals and at bedtime. Stop after 2 weeks if no improvement  Dispense: 120 capsule; Refill: 2       Shanena Pellegrino D Renato Spellman, MD 06/03/20 2:15 PM

## 2020-06-22 ENCOUNTER — Ambulatory Visit: Payer: Medicare PPO | Admitting: Family Medicine

## 2020-06-22 ENCOUNTER — Other Ambulatory Visit: Payer: Self-pay

## 2020-06-22 ENCOUNTER — Encounter: Payer: Self-pay | Admitting: Family Medicine

## 2020-06-22 VITALS — BP 126/72 | HR 76 | Temp 97.8°F | Resp 16 | Ht 65.0 in | Wt 166.1 lb

## 2020-06-22 DIAGNOSIS — Z8619 Personal history of other infectious and parasitic diseases: Secondary | ICD-10-CM | POA: Diagnosis not present

## 2020-06-22 DIAGNOSIS — R1013 Epigastric pain: Secondary | ICD-10-CM

## 2020-06-22 DIAGNOSIS — R1032 Left lower quadrant pain: Secondary | ICD-10-CM

## 2020-06-22 MED ORDER — PANTOPRAZOLE SODIUM 40 MG PO TBEC: 40.0000 mg | DELAYED_RELEASE_TABLET | Freq: Every day | ORAL | 0 refills | Status: DC

## 2020-06-22 NOTE — Progress Notes (Signed)
Name: Morgan Livingston   MRN: [id number]    DOB: 11/06/1945   Date:06/22/2020       Progress Note  Subjective  Chief Complaint  Chief Complaint  Patient presents with  . Abdominal Pain    HPI  Epigastric pain: she states symptoms started about one month ago. She was seen by Dr. Roxan Hockey end of July and was given Bentyl but did not improve in symptoms. She states initially pain was constant, cramping like, I waves, associated with nausea and vomiting, no change in bowel movements, no blood in stools. No change in her diet prior to onset of symptoms. She states now episodes only when she eats, only able to eat apple sauce and Activia, other foods caused severe pain. Pain starts on her epigastric pain and radiates to her back. She has  History of h. Pylori but feels different since pain is not constant now, also does not feel like her IBS. She has lost 6 lbs since last visit She has a history of diverticulosis and had one episode of diverticulitis when she was younger   Patient Active Problem List   Diagnosis Date Noted  . History of IBS 06/03/2020  . Mass of arm, left   . MGUS (monoclonal gammopathy of unknown significance) 09/01/2018  . Secondary hyperparathyroidism ([place]) 09/01/2018  . B12 deficiency 08/19/2018  . Atherosclerosis of aorta ([place]) 07/25/2018  . Thrombocytopenia ([place]) 05/09/2018  . Proteinuria 05/09/2018  . Positive ANA (antinuclear antibody) 04/02/2018  . Bilateral dry eyes 04/02/2018  . History of hysterectomy 05/23/2015  . Chronic kidney disease, stage III (moderate) 05/23/2015  . Benign hypertension 05/22/2015  . Chronic constipation 05/22/2015  . DDD (degenerative disc disease), lumbar 05/22/2015  . DD (diverticular disease) 05/22/2015  . Dyslipidemia 05/22/2015  . History of cervical cancer 05/22/2015  . Helicobacter pylori gastrointestinal tract infection 05/22/2015  . Blood glucose elevated 05/22/2015  . Overweight (BMI 25.0-29.9) 05/22/2015  . Perennial  allergic rhinitis with seasonal variation 05/22/2015  . Vitamin D deficiency 05/22/2015  . Impingement syndrome of shoulder 01/14/2015    Past Surgical History:  Procedure Laterality Date  . ABDOMINAL HYSTERECTOMY    . BREAST BIOPSY Right 04/09/2006   negative  . BREAST BIOPSY Left 12/04/2017   fibroadenmatoid change with coarse calcs  . COLONOSCOPY WITH PROPOFOL N/A 10/07/2018   Procedure: COLONOSCOPY WITH PROPOFOL;  Surgeon: [name], MD;  Location: [facility] ENDOSCOPY;  Service: Gastroenterology;  Laterality: N/A;  . LIPOMA EXCISION Left 04/15/2020   Procedure: EXCISION LIPOMA, left arm;  Surgeon: Fredirick Maudlin, MD;  Location: [facility] ORS;  Service: General;  Laterality: Left;    Family History  Problem Relation Age of Onset  . Congestive Heart Failure Mother 20  . Heart attack Father 58  . Sinusitis Sister   . Stomach cancer Sister   . Uterine cancer Sister 58  . Liver cancer Sister   . Pancreatic cancer Sister   . Dementia Sister        32  . Breast cancer Neg Hx     Social History   Tobacco Use  . Smoking status: Former Smoker    Packs/day: 1.00    Years: 2.00    Pack years: 2.00    Types: Cigarettes    Quit date: 1963    Years since quitting: 58.6  . Smokeless tobacco: Never Used  . Tobacco comment: smoking cessation materials not required  Substance Use Topics  . Alcohol use: No    Alcohol/week: 0.0 standard drinks  Current Outpatient Medications:  .  acetaminophen (TYLENOL) 500 MG tablet, Take 2 tablets (1,000 mg total) by mouth every 6 (six) hours as needed for mild pain or headache., Disp: 30 tablet, Rfl: 0 .  aspirin 81 MG tablet, Take 81 mg by mouth daily., Disp: , Rfl:  .  Biotin 5 MG CAPS, Take by mouth daily., Disp: , Rfl:  .  carboxymethylcellulose (REFRESH PLUS) 0.5 % SOLN, Place 1 drop into both eyes daily., Disp: , Rfl:  .  cholecalciferol (VITAMIN D) 1000 UNITS tablet, Take 1,000 Units by mouth daily. , Disp: , Rfl:  .  dicyclomine (BENTYL)  10 MG capsule, Take 1 capsule (10 mg total) by mouth 4 (four) times daily -  before meals and at bedtime. Stop after 2 weeks if no improvement, Disp: 120 capsule, Rfl: 2 .  fexofenadine (ALLEGRA) 180 MG tablet, Take 180 mg by mouth daily., Disp: , Rfl:  .  fluticasone (FLONASE) 50 MCG/ACT nasal spray, SPRAY 2 SPRAYS INTO EACH NOSTRIL EVERY DAY (Patient taking differently: Place 2 sprays into both nostrils daily as needed for rhinitis. ), Disp: 48 g, Rfl: 1 .  losartan-hydrochlorothiazide (HYZAAR) 100-25 MG tablet, Take 1 tablet by mouth daily., Disp: 90 tablet, Rfl: 1 .  rosuvastatin (CRESTOR) 20 MG tablet, Take 1 tablet (20 mg total) by mouth daily. (Patient taking differently: Take 20 mg by mouth at bedtime. ), Disp: 90 tablet, Rfl: 1 .  vitamin B-12 (CYANOCOBALAMIN) 1000 MCG tablet, Take 1 tablet (1,000 mcg total) by mouth daily. (Patient taking differently: Take 1,000 mcg by mouth once a week. Wednesday), Disp: 90 tablet, Rfl: 1 .  vitamin E 1000 UNIT capsule, Take 1,000 Units by mouth 3 (three) times a week., Disp: , Rfl:   No Known Allergies  I personally reviewed active problem list, medication list, allergies, family history, social history, health maintenance, notes from last encounter with the patient/caregiver today.   Review of Systems  Constitutional: Negative.   HENT: Negative.   Eyes: Negative.   Respiratory: Negative.   Cardiovascular: Negative.   Gastrointestinal: Positive for abdominal pain.  Genitourinary: Negative.   Musculoskeletal: Negative.   Skin: Negative.   Neurological: Negative.   Endo/Heme/Allergies: Negative.   Psychiatric/Behavioral: Negative.   All other systems reviewed and are negative.    Objective  Vitals:   06/22/20 1048  BP: 126/72  Pulse: 76  Resp: 16  Temp: 97.8 F (36.6 C)  TempSrc: Oral  SpO2: 98%  Weight: 166 lb 1.6 oz (75.3 kg)  Height: 5\' 5"  (1.651 m)    Body mass index is 27.64 kg/m.  Constitutional: Patient appears  well-developed and well-nourished. Overweight. No distress.  HEENT: head atraumatic, normocephalic, pupils equal and reactive to light, neck supple Cardiovascular: Normal rate, regular rhythm and normal heart sounds.  No murmur heard. No BLE edema. Pulmonary/Chest: Effort normal and breath sounds normal. No respiratory distress. Abdominal: Soft.  There is LLQ pain with some voluntary guarding  Psychiatric: Patient has a normal mood and affect. behavior is normal. Judgment and thought content normal.  Recent Results (from the past 2160 hour(s))  Lipid panel     Status: Abnormal   Collection Time: 04/11/20 12:30 PM  Result Value Ref Range   Cholesterol 221 (H) <200 mg/dL   HDL 53 > OR = 50 mg/dL   Triglycerides 121 <150 mg/dL   LDL Cholesterol (Calc) 144 (H) mg/dL (calc)    Comment: Reference range: <100 . Desirable range <100 mg/dL for primary prevention;   <  70 mg/dL for patients with CHD or diabetic patients  with > or = 2 CHD risk factors. Marland Kitchen LDL-C is now calculated using the Martin-Hopkins  calculation, which is a validated novel method providing  better accuracy than the Friedewald equation in the  estimation of LDL-C.  Cresenciano Genre et al. Annamaria Helling. 5361;443(15): 2061-2068  (http://education.Quest[employer].com/faq/FAQ164)    Total CHOL/HDL Ratio 4.2 <5.0 (calc)   Non-HDL Cholesterol (Calc) 168 (H) <130 mg/dL (calc)    Comment: For patients with diabetes plus 1 major ASCVD risk  factor, treating to a non-HDL-C goal of <100 mg/dL  (LDL-C of <70 mg/dL) is considered a therapeutic  option.   SARS CORONAVIRUS 2 (TAT 6-24 HRS) Nasopharyngeal Nasopharyngeal Swab     Status: None   Collection Time: 04/13/20  9:57 AM   Specimen: Nasopharyngeal Swab  Result Value Ref Range   SARS Coronavirus 2 NEGATIVE NEGATIVE    Comment: (NOTE) SARS-CoV-2 target nucleic acids are NOT DETECTED. The SARS-CoV-2 RNA is generally detectable in upper and lower respiratory specimens during the acute phase of  infection. Negative results do not preclude SARS-CoV-2 infection, do not rule out co-infections with other pathogens, and should not be used as the sole basis for treatment or other patient management decisions. Negative results must be combined with clinical observations, patient history, and epidemiological information. The expected result is Negative. Fact Sheet for Patients: SugarRoll.be Fact Sheet for Healthcare Providers: https://www.woods-mathews.com/ This test is not yet approved or cleared by the [place] FDA and  has been authorized for detection and/or diagnosis of SARS-CoV-2 by FDA under an Emergency Use Authorization (EUA). This EUA will remain  in effect (meaning this test can be used) for the duration of the COVID-19 declaration under Section 56 4(b)(1) of the Act, 21 U.S.C. section 360bbb-3(b)(1), unless the authorization is terminated or revoked sooner. Performed at [facility], [place] [address], [place], [place] [address]   Surgical pathology     Status: None   Collection Time: 04/15/20  9:39 AM  Result Value Ref Range   SURGICAL PATHOLOGY      SURGICAL PATHOLOGY CASE: [id number] PATIENT: Gwenlyn Student Surgical Pathology Report     Specimen Submitted: A. Left arm mass  Clinical History: Left arm lipoma.      DIAGNOSIS: A.  SOFT TISSUE MASS, LEFT ARM; EXCISION: - MATURE ADIPOSE TISSUE CONSISTENT WITH LIPOMA.  GROSS DESCRIPTION: A. Labeled: Left arm mass Received: In formalin Tissue fragment(s): 1 Size: 10.7 x 9.2 x 3.2 cm Description: A lobulated piece of fibrofatty tissue with orienting sutures designated short stitch superior, long stitch lateral, double stitch deep.  Serial sectioning through the tissue reveals a homogeneous yellow fatty cut surface. Anterior = green Inferior = blue Lateral = orange Medial = yellow Posterior = black Superior = red  Representative sections submitted in  6 cassettes (2 pieces per cassette).   Final Diagnosis performed by Quay Burow, MD.   Electronically signed 04/19/2020 9:42:12AM The electronic signature indicates that the named Attending Patholog ist has evaluated the specimen Technical component performed at [place], [address], [place], [place] [address] Lab: [phone] Dir: Rush Farmer, MD, MMM  Professional component performed at [facility], [facility], [place], [place], [place] [address] Lab: [phone] Dir: Dellia Nims. Rubinas, MD      PHQ2/9: Depression screen [facility] 2/9 06/22/2020 06/03/2020 04/11/2020 12/29/2019 12/04/2019  Decreased Interest 0 0 0 0 0  Down, Depressed, Hopeless 0 0 0 0 0  PHQ - 2 Score 0  0 0 0 0  Altered sleeping 0 0 0 - 0  Tired, decreased energy 1 0 0 - 0  Change in appetite 3 0 0 - 0  Feeling bad or failure about yourself  0 0 0 - 0  Trouble concentrating 0 0 0 - 0  Moving slowly or fidgety/restless 0 0 0 - 0  Suicidal thoughts 0 0 0 - 0  PHQ-9 Score 4 0 0 - 0  Difficult doing work/chores Not difficult at all Not difficult at all - - Not difficult at all  Some recent data might be hidden    phq 9 is negative   Fall Risk: Fall Risk  06/22/2020 06/03/2020 04/29/2020 04/12/2020 04/12/2020  Falls in the past year? 0 0 0 0 0  Number falls in past yr: 0 0 0 0 0  Injury with Fall? 0 0 0 0 0  Risk for fall due to : - - - - -  Follow up - - - - -    Functional Status Survey: Is the patient deaf or have difficulty hearing?: No Does the patient have difficulty seeing, even when wearing glasses/contacts?: No Does the patient have difficulty concentrating, remembering, or making decisions?: No Does the patient have difficulty walking or climbing stairs?: No Does the patient have difficulty dressing or bathing?: No Does the patient have difficulty doing errands alone such as visiting a doctor's office or shopping?: No   Assessment & Plan  1. Epigastric pain  - H. pylori breath  test - CBC with Differential/Platelet - Lipase - COMPLETE METABOLIC PANEL WITH GFR  2. Dyspepsia  - H. pylori breath test - CBC with Differential/Platelet - Lipase - COMPLETE METABOLIC PANEL WITH GFR  3. History of Helicobacter pylori infection  - H. pylori breath test - CBC with Differential/Platelet - Lipase - COMPLETE METABOLIC PANEL WITH GFR  4. Left lower quadrant pain  Discussed possible diverticulitis, however she has CKI and had a CT two years ago, advised to  Monitor for worsening of pain, and we will wait for labs, if white count is high we will treat her empirically with antibiotics, if other labs normal and not better proceed with CT abdomen pelvis  I,Shannon M Levens,acting as a scribe for Loistine Chance, MD.,have documented all relevant documentation on the behalf of Loistine Chance, MD,as directed by  Loistine Chance, MD while in the presence of Loistine Chance, MD.

## 2020-06-22 NOTE — Patient Instructions (Signed)
Abdominal Pain, Adult Pain in the abdomen (abdominal pain) can be caused by many things. Often, abdominal pain is not serious and it gets better with no treatment or by being treated at home. However, sometimes abdominal pain is serious. Your health care provider will ask questions about your medical history and do a physical exam to try to determine the cause of your abdominal pain. Follow these instructions at home:  Medicines  Take over-the-counter and prescription medicines only as told by your health care provider.  Do not take a laxative unless told by your health care provider. General instructions  Watch your condition for any changes.  Drink enough fluid to keep your urine pale yellow.  Keep all follow-up visits as told by your health care provider. This is important. Contact a health care provider if:  Your abdominal pain changes or gets worse.  You are not hungry or you lose weight without trying.  You are constipated or have diarrhea for more than 2-3 days.  You have pain when you urinate or have a bowel movement.  Your abdominal pain wakes you up at night.  Your pain gets worse with meals, after eating, or with certain foods.  You are vomiting and cannot keep anything down.  You have a fever.  You have blood in your urine. Get help right away if:  Your pain does not go away as soon as your health care provider told you to expect.  You cannot stop vomiting.  Your pain is only in areas of the abdomen, such as the right side or the left lower portion of the abdomen. Pain on the right side could be caused by appendicitis.  You have bloody or black stools, or stools that look like tar.  You have severe pain, cramping, or bloating in your abdomen.  You have signs of dehydration, such as: ? Dark urine, very little urine, or no urine. ? Cracked lips. ? Dry mouth. ? Sunken eyes. ? Sleepiness. ? Weakness.  You have trouble breathing or chest  pain. Summary  Often, abdominal pain is not serious and it gets better with no treatment or by being treated at home. However, sometimes abdominal pain is serious.  Watch your condition for any changes.  Take over-the-counter and prescription medicines only as told by your health care provider.  Contact a health care provider if your abdominal pain changes or gets worse.  Get help right away if you have severe pain, cramping, or bloating in your abdomen. This information is not intended to replace advice given to you by your health care provider. Make sure you discuss any questions you have with your health care provider. Document Revised: 03/02/2019 Document Reviewed: 03/02/2019 Elsevier Patient Education  2020 [employer]  

## 2020-06-23 LAB — CBC WITH DIFFERENTIAL/PLATELET
Absolute Monocytes: 563 cells/uL (ref 200–950)
Basophils Absolute: 19 cells/uL (ref 0–200)
Basophils Relative: 0.3 %
Eosinophils Absolute: 51 cells/uL (ref 15–500)
Eosinophils Relative: 0.8 %
HCT: 37 % (ref 35.0–45.0)
Hemoglobin: 12.1 g/dL (ref 11.7–15.5)
Lymphs Abs: 2131 cells/uL (ref 850–3900)
MCH: 30.6 pg (ref 27.0–33.0)
MCHC: 32.7 g/dL (ref 32.0–36.0)
MCV: 93.4 fL (ref 80.0–100.0)
MPV: 11.4 fL (ref 7.5–12.5)
Monocytes Relative: 8.8 %
Neutro Abs: 3635 cells/uL (ref 1500–7800)
Neutrophils Relative %: 56.8 %
Platelets: 163 10*3/uL (ref 140–400)
RBC: 3.96 10*6/uL (ref 3.80–5.10)
RDW: 13.2 % (ref 11.0–15.0)
Total Lymphocyte: 33.3 %
WBC: 6.4 10*3/uL (ref 3.8–10.8)

## 2020-06-23 LAB — COMPLETE METABOLIC PANEL WITH GFR
AG Ratio: 1.2 (calc) (ref 1.0–2.5)
ALT: 13 U/L (ref 6–29)
AST: 21 U/L (ref 10–35)
Albumin: 3.8 g/dL (ref 3.6–5.1)
Alkaline phosphatase (APISO): 61 U/L (ref 37–153)
BUN/Creatinine Ratio: 8 (calc) (ref 6–22)
BUN: 14 mg/dL (ref 7–25)
CO2: 32 mmol/L (ref 20–32)
Calcium: 10.1 mg/dL (ref 8.6–10.4)
Chloride: 100 mmol/L (ref 98–110)
Creat: 1.72 mg/dL — ABNORMAL HIGH (ref 0.60–0.93)
GFR, Est African American: 34 mL/min/{1.73_m2} — ABNORMAL LOW (ref >=60)
GFR, Est Non African American: 29 mL/min/{1.73_m2} — ABNORMAL LOW (ref >=60)
Globulin: 3.2 g/dL (calc) (ref 1.9–3.7)
Glucose, Bld: 108 mg/dL — ABNORMAL HIGH (ref 65–99)
Potassium: 3.6 mmol/L (ref 3.5–5.3)
Sodium: 141 mmol/L (ref 135–146)
Total Bilirubin: 0.4 mg/dL (ref 0.2–1.2)
Total Protein: 7 g/dL (ref 6.1–8.1)

## 2020-06-23 LAB — LIPASE: Lipase: 38 U/L (ref 7–60)

## 2020-06-23 LAB — H. PYLORI BREATH TEST: H. pylori Breath Test: NOT DETECTED

## 2020-07-13 NOTE — Progress Notes (Signed)
Name: Morgan Livingston   MRN: [id number]    DOB: 06/06/1946   Date:07/14/2020       Progress Note  Subjective  Chief Complaint  Follow up   HPI    Epigastric pain: she states symptoms started about one month ago. She was seen by Dr. Roxan Hockey end of July and was given Bentyl but did not improve in symptoms. She states initially pain was constant, cramping like, it was in  waves, associated with nausea and vomiting that could food or bile, no change in bowel movements, no blood in stools. No change in her diet prior to onset of symptoms. She saw me August we ordered labs , negative lipase and h. Pylori no anemia, she was given Pantoprazole but continues to have pain at least once a day, either lunch or dinner. Sometimes not associated with a meal.  states now episodes only when she eats, only able to eat apple sauce and Activia, other foods caused severe pain. No fever , but has chills after episodes and feels drained. Not associated with chest pain or palpitation , no diaphoresis. She states sometimes her abdomen gets distended.    HTN: she is back on medication losartan/hctz and bp at home was 117/78 , because of the abdominal she stopped taking all medications for a few days, but pain did not resolve, she is not back on medication yet and bp is normal, we will try lower dose and monitor  Patient Active Problem List   Diagnosis Date Noted   History of IBS 06/03/2020   Mass of arm, left    MGUS (monoclonal gammopathy of unknown significance) 09/01/2018   Secondary hyperparathyroidism ([place]) 09/01/2018   B12 deficiency 08/19/2018   Atherosclerosis of aorta ([place]) 07/25/2018   Thrombocytopenia ([place]) 05/09/2018   Proteinuria 05/09/2018   Positive ANA (antinuclear antibody) 04/02/2018   Bilateral dry eyes 04/02/2018   History of hysterectomy 05/23/2015   Chronic kidney disease, stage III (moderate) 05/23/2015   Benign hypertension 05/22/2015   Chronic constipation 05/22/2015    DDD (degenerative disc disease), lumbar 05/22/2015   DD (diverticular disease) 05/22/2015   Dyslipidemia 05/22/2015   History of cervical cancer 17/00/1749   Helicobacter pylori gastrointestinal tract infection 05/22/2015   Blood glucose elevated 05/22/2015   Overweight (BMI 25.0-29.9) 05/22/2015   Perennial allergic rhinitis with seasonal variation 05/22/2015   Vitamin D deficiency 05/22/2015   Impingement syndrome of shoulder 01/14/2015    Past Surgical History:  Procedure Laterality Date   ABDOMINAL HYSTERECTOMY     BREAST BIOPSY Right 04/09/2006   negative   BREAST BIOPSY Left 12/04/2017   fibroadenmatoid change with coarse calcs   COLONOSCOPY WITH PROPOFOL N/A 10/07/2018   Procedure: COLONOSCOPY WITH PROPOFOL;  Surgeon: [name], MD;  Location: [facility] ENDOSCOPY;  Service: Gastroenterology;  Laterality: N/A;   LIPOMA EXCISION Left 04/15/2020   Procedure: EXCISION LIPOMA, left arm;  Surgeon: Fredirick Maudlin, MD;  Location: [facility] ORS;  Service: General;  Laterality: Left;    Family History  Problem Relation Age of Onset   Congestive Heart Failure Mother 57   Heart attack Father 68   Sinusitis Sister    Stomach cancer Sister    Uterine cancer Sister 70   Liver cancer Sister    Pancreatic cancer Sister    Dementia Sister        84   Breast cancer Neg Hx     Social History   Tobacco Use   Smoking status: Former Smoker    Packs/day:  1.00    Years: 2.00    Pack years: 2.00    Types: Cigarettes    Quit date: 1963    Years since quitting: 58.7   Smokeless tobacco: Never Used   Tobacco comment: smoking cessation materials not required  Substance Use Topics   Alcohol use: No    Alcohol/week: 0.0 standard drinks     Current Outpatient Medications:    acetaminophen (TYLENOL) 500 MG tablet, Take 2 tablets (1,000 mg total) by mouth every 6 (six) hours as needed for mild pain or headache., Disp: 30 tablet, Rfl: 0   aspirin 81 MG tablet, Take  81 mg by mouth daily., Disp: , Rfl:    Biotin 5 MG CAPS, Take by mouth daily., Disp: , Rfl:    carboxymethylcellulose (REFRESH PLUS) 0.5 % SOLN, Place 1 drop into both eyes daily., Disp: , Rfl:    cholecalciferol (VITAMIN D) 1000 UNITS tablet, Take 1,000 Units by mouth daily. , Disp: , Rfl:    fexofenadine (ALLEGRA) 180 MG tablet, Take 180 mg by mouth daily., Disp: , Rfl:    losartan-hydrochlorothiazide (HYZAAR) 100-25 MG tablet, Take 1 tablet by mouth daily., Disp: 90 tablet, Rfl: 1   pantoprazole (PROTONIX) 40 MG tablet, Take 1 tablet (40 mg total) by mouth daily., Disp: 30 tablet, Rfl: 0   rosuvastatin (CRESTOR) 20 MG tablet, Take 1 tablet (20 mg total) by mouth daily. (Patient taking differently: Take 20 mg by mouth at bedtime. ), Disp: 90 tablet, Rfl: 1   vitamin B-12 (CYANOCOBALAMIN) 1000 MCG tablet, Take 1 tablet (1,000 mcg total) by mouth daily. (Patient taking differently: Take 1,000 mcg by mouth once a week. Wednesday), Disp: 90 tablet, Rfl: 1   vitamin E 1000 UNIT capsule, Take 1,000 Units by mouth 3 (three) times a week., Disp: , Rfl:    dicyclomine (BENTYL) 10 MG capsule, Take 1 capsule (10 mg total) by mouth 4 (four) times daily -  before meals and at bedtime. Stop after 2 weeks if no improvement (Patient not taking: Reported on 07/14/2020), Disp: 120 capsule, Rfl: 2   fluticasone (FLONASE) 50 MCG/ACT nasal spray, SPRAY 2 SPRAYS INTO EACH NOSTRIL EVERY DAY (Patient not taking: Reported on 07/14/2020), Disp: 48 g, Rfl: 1  No Known Allergies  I personally reviewed active problem list, medication list, allergies, family history, social history, health maintenance with the patient/caregiver today.   ROS  Ten systems reviewed and is negative except as mentioned in HPI   Objective  Vitals:   07/14/20 1034  BP: 122/72  Pulse: 70  Resp: 16  Temp: 97.9 F (36.6 C)  TempSrc: Oral  SpO2: 99%  Weight: 166 lb 3.2 oz (75.4 kg)  Height: 5\' 5"  (1.651 m)    Body mass index is  27.66 kg/m.  Physical Exam  Constitutional: Patient appears well-developed and well-nourished. Overweight. No distress.  HEENT: head atraumatic, normocephalic, pupils equal and reactive to light,  neck supple Cardiovascular: Normal rate, regular rhythm and normal heart sounds.  No murmur heard. No BLE edema. Pulmonary/Chest: Effort normal and breath sounds normal. No respiratory distress. Abdominal: Soft.  There is epigastric tenderness  Psychiatric: Patient has a normal mood and affect. behavior is normal. Judgment and thought content normal.  Recent Results (from the past 2160 hour(s))  H. pylori breath test     Status: None   Collection Time: 06/22/20 11:29 AM  Result Value Ref Range   H. pylori Breath Test NOT DETECTED NOT DETECT    Comment: . Antimicrobials,  proton pump inhibitors, and bismuth preparations are known to suppress H. pylori, and  ingestion of these prior to H. pylori diagnostic testing may lead to false negative results. If clinically  indicated, the test may be repeated on a new specimen obtained two weeks after discontinuing treatment. However, a positive result is still clinically valid.   CBC with Differential/Platelet     Status: None   Collection Time: 06/22/20 11:29 AM  Result Value Ref Range   WBC 6.4 3.8 - 10.8 Thousand/uL   RBC 3.96 3.80 - 5.10 Million/uL   Hemoglobin 12.1 11.7 - 15.5 g/dL   HCT 37.0 35 - 45 %   MCV 93.4 80.0 - 100.0 fL   MCH 30.6 27.0 - 33.0 pg   MCHC 32.7 32.0 - 36.0 g/dL   RDW 13.2 11.0 - 15.0 %   Platelets 163 140 - 400 Thousand/uL   MPV 11.4 7.5 - 12.5 fL   Neutro Abs 3,635 1,500 - 7,800 cells/uL   Lymphs Abs 2,131 850 - 3,900 cells/uL   Absolute Monocytes 563 200 - 950 cells/uL   Eosinophils Absolute 51 15 - 500 cells/uL   Basophils Absolute 19 0 - 200 cells/uL   Neutrophils Relative % 56.8 %   Total Lymphocyte 33.3 %   Monocytes Relative 8.8 %   Eosinophils Relative 0.8 %   Basophils Relative 0.3 %  Lipase      Status: None   Collection Time: 06/22/20 11:29 AM  Result Value Ref Range   Lipase 38 7 - 60 U/L  COMPLETE METABOLIC PANEL WITH GFR     Status: Abnormal   Collection Time: 06/22/20 11:29 AM  Result Value Ref Range   Glucose, Bld 108 (H) 65 - 99 mg/dL    Comment: .            Fasting reference interval . For someone without known diabetes, a glucose value between 100 and 125 mg/dL is consistent with prediabetes and should be confirmed with a follow-up test. .    BUN 14 7 - 25 mg/dL   Creat 1.72 (H) 0.60 - 0.93 mg/dL    Comment: For patients >68 years of age, the reference limit for Creatinine is approximately 13% higher for people identified as African-American. .    GFR, Est Non African American 29 (L) > OR = 60 mL/min/1.32m2   GFR, Est African American 34 (L) > OR = 60 mL/min/1.28m2   BUN/Creatinine Ratio 8 6 - 22 (calc)   Sodium 141 135 - 146 mmol/L   Potassium 3.6 3.5 - 5.3 mmol/L   Chloride 100 98 - 110 mmol/L   CO2 32 20 - 32 mmol/L   Calcium 10.1 8.6 - 10.4 mg/dL   Total Protein 7.0 6.1 - 8.1 g/dL   Albumin 3.8 3.6 - 5.1 g/dL   Globulin 3.2 1.9 - 3.7 g/dL (calc)   AG Ratio 1.2 1.0 - 2.5 (calc)   Total Bilirubin 0.4 0.2 - 1.2 mg/dL   Alkaline phosphatase (APISO) 61 37 - 153 U/L   AST 21 10 - 35 U/L   ALT 13 6 - 29 U/L     PHQ2/9: Depression screen [facility] 2/9 07/14/2020 06/22/2020 06/03/2020 04/11/2020 12/29/2019  Decreased Interest 0 0 0 0 0  Down, Depressed, Hopeless 0 0 0 0 0  PHQ - 2 Score 0 0 0 0 0  Altered sleeping - 0 0 0 -  Tired, decreased energy - 1 0 0 -  Change in appetite - 3 0 0 -  Feeling bad or failure about yourself  - 0 0 0 -  Trouble concentrating - 0 0 0 -  Moving slowly or fidgety/restless - 0 0 0 -  Suicidal thoughts - 0 0 0 -  PHQ-9 Score - 4 0 0 -  Difficult doing work/chores - Not difficult at all Not difficult at all - -  Some recent data might be hidden    phq 9 is negative   Fall Risk: Fall Risk  07/14/2020 06/22/2020 06/03/2020  04/29/2020 04/12/2020  Falls in the past year? 0 0 0 0 0  Number falls in past yr: 0 0 0 0 0  Injury with Fall? 0 0 0 0 0  Risk for fall due to : - - - - -  Follow up - - - - -    Functional Status Survey: Is the patient deaf or have difficulty hearing?: No Does the patient have difficulty seeing, even when wearing glasses/contacts?: No Does the patient have difficulty concentrating, remembering, or making decisions?: No Does the patient have difficulty walking or climbing stairs?: No Does the patient have difficulty dressing or bathing?: No Does the patient have difficulty doing errands alone such as visiting a doctor's office or shopping?: No   Assessment & Plan  1. Need for immunization against influenza  - Flu Vaccine QUAD High Dose(Fluad)  2. Essential hypertension  - losartan-hydrochlorothiazide (HYZAAR) 100-25 MG tablet; Take 0.5 tablets by mouth daily. To monitor bp on lower dose  Dispense: 30 tablet; Refill: 0  3. Epigastric pain  - pantoprazole (PROTONIX) 40 MG tablet; Take 1 tablet (40 mg total) by mouth daily.  Dispense: 90 tablet; Refill: 0 - NM Hepato W/Eject Fract; Future - Ambulatory referral to Gastroenterology  4. Nausea  - NM Hepato W/Eject Fract; Future - Ambulatory referral to Gastroenterology

## 2020-07-14 ENCOUNTER — Other Ambulatory Visit: Payer: Self-pay

## 2020-07-14 ENCOUNTER — Encounter: Payer: Self-pay | Admitting: Family Medicine

## 2020-07-14 ENCOUNTER — Ambulatory Visit: Payer: Medicare PPO | Admitting: Family Medicine

## 2020-07-14 ENCOUNTER — Other Ambulatory Visit: Payer: Self-pay | Admitting: Family Medicine

## 2020-07-14 VITALS — BP 122/72 | HR 70 | Temp 97.9°F | Resp 16 | Ht 65.0 in | Wt 166.2 lb

## 2020-07-14 DIAGNOSIS — R110 Nausea: Secondary | ICD-10-CM

## 2020-07-14 DIAGNOSIS — R1013 Epigastric pain: Secondary | ICD-10-CM | POA: Diagnosis not present

## 2020-07-14 DIAGNOSIS — Z23 Encounter for immunization: Secondary | ICD-10-CM | POA: Diagnosis not present

## 2020-07-14 DIAGNOSIS — I10 Essential (primary) hypertension: Secondary | ICD-10-CM | POA: Diagnosis not present

## 2020-07-14 MED ORDER — PANTOPRAZOLE SODIUM 40 MG PO TBEC: 40.0000 mg | DELAYED_RELEASE_TABLET | Freq: Every day | ORAL | 0 refills | Status: DC

## 2020-07-14 MED ORDER — LOSARTAN POTASSIUM-HCTZ 100-25 MG PO TABS: 0.5000 | ORAL_TABLET | Freq: Every day | ORAL | 0 refills | Status: DC

## 2020-07-14 NOTE — Telephone Encounter (Signed)
Patient is in office now for follow up on this medication- sent for review /possible RF after visit

## 2020-07-21 ENCOUNTER — Encounter
Admission: RE | Admit: 2020-07-21 | Discharge: 2020-07-21 | Disposition: A | Discharge status: Home / Self Care | Payer: Medicare PPO | Source: Ambulatory Visit | Attending: Family Medicine | Admitting: Family Medicine

## 2020-07-21 ENCOUNTER — Other Ambulatory Visit: Payer: Self-pay

## 2020-07-21 DIAGNOSIS — R112 Nausea with vomiting, unspecified: Secondary | ICD-10-CM | POA: Diagnosis not present

## 2020-07-21 DIAGNOSIS — R1013 Epigastric pain: Secondary | ICD-10-CM | POA: Diagnosis not present

## 2020-07-21 DIAGNOSIS — R110 Nausea: Secondary | ICD-10-CM | POA: Diagnosis not present

## 2020-07-21 MED ORDER — TECHNETIUM TC 99M MEBROFENIN IV KIT
5.1310 | PACK | Freq: Once | INTRAVENOUS | Status: AC | PRN
  Administered 2020-07-21: 5.131 via INTRAVENOUS

## 2020-07-26 ENCOUNTER — Encounter: Payer: Self-pay | Admitting: *Deleted

## 2020-10-06 DIAGNOSIS — N2581 Secondary hyperparathyroidism of renal origin: Secondary | ICD-10-CM | POA: Diagnosis not present

## 2020-10-06 DIAGNOSIS — R809 Proteinuria, unspecified: Secondary | ICD-10-CM | POA: Diagnosis not present

## 2020-10-06 DIAGNOSIS — N1832 Chronic kidney disease, stage 3b: Secondary | ICD-10-CM | POA: Diagnosis not present

## 2020-10-06 DIAGNOSIS — I10 Essential (primary) hypertension: Secondary | ICD-10-CM | POA: Diagnosis not present

## 2020-10-10 NOTE — Progress Notes (Unsigned)
Name: Morgan Livingston   MRN: [id number]    DOB: 01/21/1946   Date:10/11/2020       Progress Note  Subjective  Chief Complaint  Follow up  HPI  Epigastric pain: symptoms started Summer of 2021 She was seen by Dr. Roxan Hockey end of July and was given Bentyl but did not improve in symptoms. She states initially pain was constant, cramping like, it was in  waves, associated with nausea and vomiting that could be  food or bile, no change in bowel movements, no blood in stools. No know triggers . She saw me August we ordered labs , negative lipase and h. Pylori no anemia, she was given Pantoprazole initially still had pain after lunch or dinner but over the past two months she has not had any pain. She went on a bland diet. She is eating healthy but not as restrictive like she was. She is willing to try weaning self off PPI, explained she needs to it slowly and go back on medication if unable to stop it   HTN: she is back on medication losartan/hctz but down to 50/12.5 and bp is at goal, continue current regiment. No chest pain, palpitation or dizziness  B12 deficiency /Thrombocytopenia/MUGS: under the care of [name], last B12 improved, sheis taking supplementation once a week now . She has a history of anemia and thrombocytopenia but last level at nephrologist was good   Atherosclerosis aorta/dyslipidemia: found on X-ray of lumbar spine, she does not have claudication. She will continue statin and aspirin daily,last LDL was above 100, advised to go up on Crestor to 40 mg on her last visit or add Zetia but she does not want to change medications at this time  Proteinuria and CKI stage III: seeing [name], she had secondary hyperparathyroidism,and vitamin D deficiency. She denies cramping, pruritis or decrease in urine output  .Last GFRwas stable CKI stage III - GFR of 42 last week  Pth was over 100   Positive for Sjogren's Anti SS-A and also ANA: on labs done 03/2018 by [name] , 03/2018  showed negative for M Spike UPEP spike and monitored by [name]. She denies dry mouth , but states she has a long history of dry eyes and not changed - uses eye drops. She denies joint aches or effusion .   Hyperglycemia: no family history of diabetes, A1Cwas as high at 6.4 % but down to 6.3 % January 2021  avoiding sweets even though she likes to bake, husband has diabetes .Denies polyphagia, polydipsia or polyuria  Chronic back pain: she is working on her posture, she states worse when she wears flat shoes it causes worsening of symptoms, wearing tennis shoes today  . Unchanged   Patient Active Problem List   Diagnosis Date Noted  . History of IBS 06/03/2020  . Mass of arm, left   . MGUS (monoclonal gammopathy of unknown significance) 09/01/2018  . Secondary hyperparathyroidism ([place]) 09/01/2018  . B12 deficiency 08/19/2018  . Atherosclerosis of aorta ([place]) 07/25/2018  . Thrombocytopenia ([place]) 05/09/2018  . Proteinuria 05/09/2018  . Positive ANA (antinuclear antibody) 04/02/2018  . Bilateral dry eyes 04/02/2018  . History of hysterectomy 05/23/2015  . Chronic kidney disease, stage III (moderate) ([place]) 05/23/2015  . Benign hypertension 05/22/2015  . Chronic constipation 05/22/2015  . DDD (degenerative disc disease), lumbar 05/22/2015  . DD (diverticular disease) 05/22/2015  . Dyslipidemia 05/22/2015  . History of cervical cancer 05/22/2015  . Helicobacter pylori gastrointestinal tract infection 05/22/2015  .  Blood glucose elevated 05/22/2015  . Overweight (BMI 25.0-29.9) 05/22/2015  . Perennial allergic rhinitis with seasonal variation 05/22/2015  . Vitamin D deficiency 05/22/2015  . Impingement syndrome of shoulder 01/14/2015    Past Surgical History:  Procedure Laterality Date  . ABDOMINAL HYSTERECTOMY    . BREAST BIOPSY Right 04/09/2006   negative  . BREAST BIOPSY Left 12/04/2017   fibroadenmatoid change with coarse calcs  . COLONOSCOPY WITH PROPOFOL N/A 10/07/2018    Procedure: COLONOSCOPY WITH PROPOFOL;  Surgeon: [name], MD;  Location[facility] ENDOSCOPY;  Service: Gastroenterology;  Laterality: N/A;  . LIPOMA EXCISION Left 04/15/2020   Procedure: EXCISION LIPOMA, left arm;  Surgeon: Fredirick Maudlin, MD;  Location: [facility] ORS;  Service: General;  Laterality: Left;    Family History  Problem Relation Age of Onset  . Congestive Heart Failure Mother 34  . Heart attack Father 43  . Sinusitis Sister   . Stomach cancer Sister   . Uterine cancer Sister 55  . Liver cancer Sister   . Pancreatic cancer Sister   . Dementia Sister        94  . Breast cancer Neg Hx     Social History   Tobacco Use  . Smoking status: Former Smoker    Packs/day: 1.00    Years: 2.00    Pack years: 2.00    Types: Cigarettes    Quit date: 1963    Years since quitting: 58.9  . Smokeless tobacco: Never Used  . Tobacco comment: smoking cessation materials not required  Substance Use Topics  . Alcohol use: No    Alcohol/week: 0.0 standard drinks     Current Outpatient Medications:  .  acetaminophen (TYLENOL) 500 MG tablet, Take 2 tablets (1,000 mg total) by mouth every 6 (six) hours as needed for mild pain or headache., Disp: 30 tablet, Rfl: 0 .  aspirin 81 MG tablet, Take 81 mg by mouth daily., Disp: , Rfl:  .  Biotin 5 MG CAPS, Take by mouth daily., Disp: , Rfl:  .  carboxymethylcellulose (REFRESH PLUS) 0.5 % SOLN, Place 1 drop into both eyes daily., Disp: , Rfl:  .  cholecalciferol (VITAMIN D) 1000 UNITS tablet, Take 1,000 Units by mouth daily. , Disp: , Rfl:  .  fexofenadine (ALLEGRA) 180 MG tablet, Take 180 mg by mouth daily., Disp: , Rfl:  .  losartan-hydrochlorothiazide (HYZAAR) 100-25 MG tablet, Take 0.5 tablets by mouth daily. To monitor bp on lower dose, Disp: 30 tablet, Rfl: 0 .  pantoprazole (PROTONIX) 40 MG tablet, Take 1 tablet (40 mg total) by mouth daily., Disp: 90 tablet, Rfl: 0 .  rosuvastatin (CRESTOR) 20 MG tablet, Take 1 tablet (20 mg total) by mouth  daily. (Patient taking differently: Take 20 mg by mouth at bedtime. ), Disp: 90 tablet, Rfl: 1 .  vitamin B-12 (CYANOCOBALAMIN) 1000 MCG tablet, Take 1 tablet (1,000 mcg total) by mouth daily. (Patient taking differently: Take 1,000 mcg by mouth once a week. Wednesday), Disp: 90 tablet, Rfl: 1 .  vitamin E 1000 UNIT capsule, Take 1,000 Units by mouth 3 (three) times a week., Disp: , Rfl:  .  dicyclomine (BENTYL) 10 MG capsule, Take 1 capsule (10 mg total) by mouth 4 (four) times daily -  before meals and at bedtime. Stop after 2 weeks if no improvement (Patient not taking: Reported on 07/14/2020), Disp: 120 capsule, Rfl: 2 .  fluticasone (FLONASE) 50 MCG/ACT nasal spray, SPRAY 2 SPRAYS INTO EACH NOSTRIL EVERY DAY (Patient not taking: Reported  on 07/14/2020), Disp: 48 g, Rfl: 1  No Known Allergies  I personally reviewed active problem list, medication list, allergies, family history, social history, health maintenance, notes from last encounter with the patient/caregiver today.   ROS  Constitutional: Negative for fever or weight change.  Respiratory: Negative for cough and shortness of breath.   Cardiovascular: Negative for chest pain or palpitations.  Gastrointestinal: Negative for abdominal pain, no bowel changes.  Musculoskeletal: Negative for gait problem or joint swelling.  Skin: Negative for rash.  Neurological: Negative for dizziness or headache.  No other specific complaints in a complete review of systems (except as listed in HPI above).  Objective  Vitals:   10/11/20 0931  BP: 128/78  Pulse: 61  Resp: 16  Temp: (!) 97.5 F (36.4 C)  TempSrc: Oral  SpO2: 99%  Weight: 168 lb 14.4 oz (76.6 kg)  Height: 5\' 5"  (1.651 m)    Body mass index is 28.11 kg/m.  Physical Exam  Constitutional: Patient appears well-developed and well-nourished. Obese No distress.  HEENT: head atraumatic, normocephalic, pupils equal and reactive to light,  neck supple Cardiovascular: Normal rate,  regular rhythm and normal heart sounds.  No murmur heard. No BLE edema. Pulmonary/Chest: Effort normal and breath sounds normal. No respiratory distress. Abdominal: Soft.  There is no tenderness. Psychiatric: Patient has a normal mood and affect. behavior is normal. Judgment and thought content normal.  PHQ2/9: Depression screen [facility] 2/9 10/11/2020 07/14/2020 06/22/2020 06/03/2020 04/11/2020  Decreased Interest 0 0 0 0 0  Down, Depressed, Hopeless 0 0 0 0 0  PHQ - 2 Score 0 0 0 0 0  Altered sleeping 0 - 0 0 0  Tired, decreased energy 0 - 1 0 0  Change in appetite 0 - 3 0 0  Feeling bad or failure about yourself  0 - 0 0 0  Trouble concentrating 0 - 0 0 0  Moving slowly or fidgety/restless 0 - 0 0 0  Suicidal thoughts 0 - 0 0 0  PHQ-9 Score 0 - 4 0 0  Difficult doing work/chores - - Not difficult at all Not difficult at all -  Some recent data might be hidden    phq 9 is positive   Fall Risk: Fall Risk  10/11/2020 07/14/2020 06/22/2020 06/03/2020 04/29/2020  Falls in the past year? 0 0 0 0 0  Number falls in past yr: 0 0 0 0 0  Injury with Fall? 0 0 0 0 0  Risk for fall due to : - - - - -  Follow up - - - - -     Functional Status Survey: Is the patient deaf or have difficulty hearing?: No Does the patient have difficulty seeing, even when wearing glasses/contacts?: No Does the patient have difficulty concentrating, remembering, or making decisions?: No Does the patient have difficulty walking or climbing stairs?: No Does the patient have difficulty dressing or bathing?: No Does the patient have difficulty doing errands alone such as visiting a doctor's office or shopping?: No    Assessment & Plan  1. Stage 3a chronic kidney disease (HCC)  Stable   2. B12 deficiency  On B12 supplementation  3. Dyslipidemia  - rosuvastatin (CRESTOR) 20 MG tablet; Take 1 tablet (20 mg total) by mouth at bedtime.  Dispense: 90 tablet; Refill: 1  4. Essential hypertension  Continue losartan  hctz 50/12.5   5. MGUS (monoclonal gammopathy of unknown significance)  Seeing [name]  6. Atherosclerosis of aorta ([place])  -  rosuvastatin (CRESTOR) 20 MG tablet; Take 1 tablet (20 mg total) by mouth at bedtime.  Dispense: 90 tablet; Refill: 1  7. Vitamin D deficiency  Continue supplementation   8. Secondary hyperparathyroidism ([place])  Still high due to CKI  9. Atherosclerosis of abdominal aorta (HCC)  On Crestor but not at goal   10. Epigastric pain  - pantoprazole (PROTONIX) 40 MG tablet; Take 1 tablet (40 mg total) by mouth daily.  Dispense: 90 tablet; Refill: 0  11. Hyperglycemia  - POCT HgB A1C

## 2020-10-11 ENCOUNTER — Encounter: Payer: Self-pay | Admitting: Family Medicine

## 2020-10-11 ENCOUNTER — Other Ambulatory Visit: Payer: Self-pay

## 2020-10-11 ENCOUNTER — Ambulatory Visit: Payer: Medicare PPO | Admitting: Family Medicine

## 2020-10-11 VITALS — BP 128/78 | HR 61 | Temp 97.5°F | Resp 16 | Ht 65.0 in | Wt 168.9 lb

## 2020-10-11 DIAGNOSIS — N2581 Secondary hyperparathyroidism of renal origin: Secondary | ICD-10-CM

## 2020-10-11 DIAGNOSIS — E538 Deficiency of other specified B group vitamins: Secondary | ICD-10-CM | POA: Diagnosis not present

## 2020-10-11 DIAGNOSIS — R739 Hyperglycemia, unspecified: Secondary | ICD-10-CM

## 2020-10-11 DIAGNOSIS — I700 Atherosclerosis of aorta: Secondary | ICD-10-CM | POA: Diagnosis not present

## 2020-10-11 DIAGNOSIS — D472 Monoclonal gammopathy: Secondary | ICD-10-CM | POA: Diagnosis not present

## 2020-10-11 DIAGNOSIS — N1831 Chronic kidney disease, stage 3a: Secondary | ICD-10-CM

## 2020-10-11 DIAGNOSIS — E785 Hyperlipidemia, unspecified: Secondary | ICD-10-CM | POA: Diagnosis not present

## 2020-10-11 DIAGNOSIS — E559 Vitamin D deficiency, unspecified: Secondary | ICD-10-CM | POA: Diagnosis not present

## 2020-10-11 DIAGNOSIS — R1013 Epigastric pain: Secondary | ICD-10-CM

## 2020-10-11 DIAGNOSIS — I10 Essential (primary) hypertension: Secondary | ICD-10-CM

## 2020-10-11 LAB — POCT GLYCOSYLATED HEMOGLOBIN (HGB A1C): Hemoglobin A1C: 6.2 % — AB (ref 4.0–5.6)

## 2020-10-11 MED ORDER — LOSARTAN POTASSIUM-HCTZ 50-12.5 MG PO TABS: 1.0000 | ORAL_TABLET | Freq: Every day | ORAL | 1 refills | Status: DC

## 2020-10-11 MED ORDER — ROSUVASTATIN CALCIUM 20 MG PO TABS: 20.0000 mg | ORAL_TABLET | Freq: Every day | ORAL | 1 refills | Status: DC

## 2020-10-11 MED ORDER — PANTOPRAZOLE SODIUM 40 MG PO TBEC: 40.0000 mg | DELAYED_RELEASE_TABLET | Freq: Every day | ORAL | 0 refills | Status: DC

## 2020-10-13 ENCOUNTER — Ambulatory Visit: Payer: Medicare PPO

## 2020-10-13 VITALS — BP 142/82

## 2020-10-13 DIAGNOSIS — Z0130 Encounter for examination of blood pressure without abnormal findings: Secondary | ICD-10-CM

## 2020-10-27 ENCOUNTER — Other Ambulatory Visit: Payer: Self-pay | Admitting: Family Medicine

## 2020-10-27 DIAGNOSIS — I10 Essential (primary) hypertension: Secondary | ICD-10-CM

## 2020-11-15 ENCOUNTER — Other Ambulatory Visit: Payer: Self-pay | Admitting: Family Medicine

## 2020-11-15 DIAGNOSIS — Z1231 Encounter for screening mammogram for malignant neoplasm of breast: Secondary | ICD-10-CM

## 2020-12-19 ENCOUNTER — Other Ambulatory Visit: Payer: Self-pay

## 2020-12-19 ENCOUNTER — Ambulatory Visit
Admission: RE | Admit: 2020-12-19 | Discharge: 2020-12-19 | Disposition: A | Discharge status: Home / Self Care | Payer: Medicare PPO | Source: Ambulatory Visit | Attending: Family Medicine | Admitting: Family Medicine

## 2020-12-19 DIAGNOSIS — Z1231 Encounter for screening mammogram for malignant neoplasm of breast: Secondary | ICD-10-CM | POA: Diagnosis not present

## 2020-12-29 ENCOUNTER — Other Ambulatory Visit: Payer: Self-pay

## 2020-12-29 ENCOUNTER — Ambulatory Visit (INDEPENDENT_AMBULATORY_CARE_PROVIDER_SITE_OTHER): Payer: Medicare PPO

## 2020-12-29 VITALS — BP 132/82 | HR 67 | Temp 97.7°F | Resp 16 | Ht 65.0 in | Wt 169.6 lb

## 2020-12-29 DIAGNOSIS — Z0000 Encounter for general adult medical examination without abnormal findings: Secondary | ICD-10-CM | POA: Diagnosis not present

## 2020-12-29 NOTE — Patient Instructions (Signed)
Morgan Livingston , Thank you for taking time to come for your Medicare Wellness Visit. I appreciate your ongoing commitment to your health goals. Please review the following plan we discussed and let me know if I can assist you in the future.   Screening recommendations/referrals: Colonoscopy: done 10/07/18. Repeat 10/2021 Mammogram: done 12/19/20 Bone Density: done 01/11/20 Recommended yearly ophthalmology/optometry visit for glaucoma screening and checkup Recommended yearly dental visit for hygiene and checkup  Vaccinations: Influenza vaccine: done 07/14/20 Pneumococcal vaccine: done 12/28/14 Tdap vaccine: done 05/14/17 Shingles vaccine: Shingrix discussed. Please contact your pharmacy for coverage information.  Covid-19: done 12/16/19, 01/13/20 & 09/05/20  Advanced directives: Advance directive discussed with you today. I have provided a copy for you to complete at home and have notarized. Once this is complete please bring a copy in to our office so we can scan it into your chart.  Conditions/risks identified: Keep up the great work!  Next appointment: Follow up in one year for your annual wellness visit    Preventive Care 65 Years and Older, Female Preventive care refers to lifestyle choices and visits with your health care provider that can promote health and wellness. What does preventive care include?  A yearly physical exam. This is also called an annual well check.  Dental exams once or twice a year.  Routine eye exams. Ask your health care provider how often you should have your eyes checked.  Personal lifestyle choices, including:  Daily care of your teeth and gums.  Regular physical activity.  Eating a healthy diet.  Avoiding tobacco and drug use.  Limiting alcohol use.  Practicing safe sex.  Taking low-dose aspirin every day.  Taking vitamin and mineral supplements as recommended by your health care provider. What happens during an annual well check? The services and  screenings done by your health care provider during your annual well check will depend on your age, overall health, lifestyle risk factors, and family history of disease. Counseling  Your health care provider may ask you questions about your:  Alcohol use.  Tobacco use.  Drug use.  Emotional well-being.  Home and relationship well-being.  Sexual activity.  Eating habits.  History of falls.  Memory and ability to understand (cognition).  Work and work Statistician.  Reproductive health. Screening  You may have the following tests or measurements:  Height, weight, and BMI.  Blood pressure.  Lipid and cholesterol levels. These may be checked every 5 years, or more frequently if you are over 69 years old.  Skin check.  Lung cancer screening. You may have this screening every year starting at age 35 if you have a 30-pack-year history of smoking and currently smoke or have quit within the past 15 years.  Fecal occult blood test (FOBT) of the stool. You may have this test every year starting at age 6.  Flexible sigmoidoscopy or colonoscopy. You may have a sigmoidoscopy every 5 years or a colonoscopy every 10 years starting at age 79.  Hepatitis C blood test.  Hepatitis B blood test.  Sexually transmitted disease (STD) testing.  Diabetes screening. This is done by checking your blood sugar (glucose) after you have not eaten for a while (fasting). You may have this done every 1-3 years.  Bone density scan. This is done to screen for osteoporosis. You may have this done starting at age 22.  Mammogram. This may be done every 1-2 years. Talk to your health care provider about how often you should have regular mammograms. Talk  with your health care provider about your test results, treatment options, and if necessary, the need for more tests. Vaccines  Your health care provider may recommend certain vaccines, such as:  Influenza vaccine. This is recommended every  year.  Tetanus, diphtheria, and acellular pertussis (Tdap, Td) vaccine. You may need a Td booster every 10 years.  Zoster vaccine. You may need this after age 23.  Pneumococcal 13-valent conjugate (PCV13) vaccine. One dose is recommended after age 79.  Pneumococcal polysaccharide (PPSV23) vaccine. One dose is recommended after age 61. Talk to your health care provider about which screenings and vaccines you need and how often you need them. This information is not intended to replace advice given to you by your health care provider. Make sure you discuss any questions you have with your health care provider. Document Released: 11/18/2015 Document Revised: 07/11/2016 Document Reviewed: 08/23/2015 Elsevier Interactive Patient Education  2017 [place] Prevention in the Home Falls can cause injuries. They can happen to people of all ages. There are many things you can do to make your home safe and to help prevent falls. What can I do on the outside of my home?  Regularly fix the edges of walkways and driveways and fix any cracks.  Remove anything that might make you trip as you walk through a door, such as a raised step or threshold.  Trim any bushes or trees on the path to your home.  Use bright outdoor lighting.  Clear any walking paths of anything that might make someone trip, such as rocks or tools.  Regularly check to see if handrails are loose or broken. Make sure that both sides of any steps have handrails.  Any raised decks and porches should have guardrails on the edges.  Have any leaves, snow, or ice cleared regularly.  Use sand or salt on walking paths during winter.  Clean up any spills in your garage right away. This includes oil or grease spills. What can I do in the bathroom?  Use night lights.  Install grab bars by the toilet and in the tub and shower. Do not use towel bars as grab bars.  Use non-skid mats or decals in the tub or shower.  If you  need to sit down in the shower, use a plastic, non-slip stool.  Keep the floor dry. Clean up any water that spills on the floor as soon as it happens.  Remove soap buildup in the tub or shower regularly.  Attach bath mats securely with double-sided non-slip rug tape.  Do not have throw rugs and other things on the floor that can make you trip. What can I do in the bedroom?  Use night lights.  Make sure that you have a light by your bed that is easy to reach.  Do not use any sheets or blankets that are too big for your bed. They should not hang down onto the floor.  Have a firm chair that has side arms. You can use this for support while you get dressed.  Do not have throw rugs and other things on the floor that can make you trip. What can I do in the kitchen?  Clean up any spills right away.  Avoid walking on wet floors.  Keep items that you use a lot in easy-to-reach places.  If you need to reach something above you, use a strong step stool that has a grab bar.  Keep electrical cords out of the way.  Do  not use floor polish or wax that makes floors slippery. If you must use wax, use non-skid floor wax.  Do not have throw rugs and other things on the floor that can make you trip. What can I do with my stairs?  Do not leave any items on the stairs.  Make sure that there are handrails on both sides of the stairs and use them. Fix handrails that are broken or loose. Make sure that handrails are as long as the stairways.  Check any carpeting to make sure that it is firmly attached to the stairs. Fix any carpet that is loose or worn.  Avoid having throw rugs at the top or bottom of the stairs. If you do have throw rugs, attach them to the floor with carpet tape.  Make sure that you have a light switch at the top of the stairs and the bottom of the stairs. If you do not have them, ask someone to add them for you. What else can I do to help prevent falls?  Wear shoes  that:  Do not have high heels.  Have rubber bottoms.  Are comfortable and fit you well.  Are closed at the toe. Do not wear sandals.  If you use a stepladder:  Make sure that it is fully opened. Do not climb a closed stepladder.  Make sure that both sides of the stepladder are locked into place.  Ask someone to hold it for you, if possible.  Clearly mark and make sure that you can see:  Any grab bars or handrails.  First and last steps.  Where the edge of each step is.  Use tools that help you move around (mobility aids) if they are needed. These include:  Canes.  Walkers.  Scooters.  Crutches.  Turn on the lights when you go into a dark area. Replace any light bulbs as soon as they burn out.  Set up your furniture so you have a clear path. Avoid moving your furniture around.  If any of your floors are uneven, fix them.  If there are any pets around you, be aware of where they are.  Review your medicines with your doctor. Some medicines can make you feel dizzy. This can increase your chance of falling. Ask your doctor what other things that you can do to help prevent falls. This information is not intended to replace advice given to you by your health care provider. Make sure you discuss any questions you have with your health care provider. Document Released: 08/18/2009 Document Revised: 03/29/2016 Document Reviewed: 11/26/2014 Elsevier Interactive Patient Education  2017 [employer].

## 2020-12-29 NOTE — Progress Notes (Signed)
Subjective:   EMMELYN SCHMALE is a 75 y.o. female who presents for Medicare Annual (Subsequent) preventive examination.  Review of Systems     Cardiac Risk Factors include: advanced age (>50men, >68 women);dyslipidemia;hypertension     Objective:    Today's Vitals   12/29/20 0924  BP: 132/82  Pulse: 67  Resp: 16  Temp: 97.7 F (36.5 C)  TempSrc: Oral  SpO2: 99%  Weight: 169 lb 9.6 oz (76.9 kg)  Height: 5\' 5"  (1.651 m)   Body mass index is 28.22 kg/m.  Advanced Directives 12/29/2020 01/27/2020 12/29/2019 03/23/2019 12/23/2018 12/22/2018 10/07/2018  Does Patient Have a Medical Advance Directive? No No No No No No No  Would patient like information on creating a medical advance directive? Yes (MAU/Ambulatory/Procedural Areas - Information given) No - Patient declined No - Patient declined - No - Patient declined No - Patient declined -    Current Medications (verified) Outpatient Encounter Medications as of 12/29/2020  Medication Sig  . acetaminophen (TYLENOL) 500 MG tablet Take 2 tablets (1,000 mg total) by mouth every 6 (six) hours as needed for mild pain or headache.  Marland Kitchen aspirin 81 MG tablet Take 81 mg by mouth daily.  . Biotin 5 MG CAPS Take by mouth daily.  . carboxymethylcellulose (REFRESH PLUS) 0.5 % SOLN Place 1 drop into both eyes daily.  . cholecalciferol (VITAMIN D) 1000 UNITS tablet Take 1,000 Units by mouth daily.   . fexofenadine (ALLEGRA) 180 MG tablet Take 180 mg by mouth daily.  Marland Kitchen losartan-hydrochlorothiazide (HYZAAR) 50-12.5 MG tablet Take 1 tablet by mouth daily.  . pantoprazole (PROTONIX) 40 MG tablet Take 1 tablet (40 mg total) by mouth daily.  . rosuvastatin (CRESTOR) 20 MG tablet Take 1 tablet (20 mg total) by mouth at bedtime.  . vitamin B-12 (CYANOCOBALAMIN) 1000 MCG tablet Take 1 tablet (1,000 mcg total) by mouth daily. (Patient taking differently: Take 1,000 mcg by mouth once a week. Wednesday)  . vitamin E 1000 UNIT capsule Take 1,000 Units by mouth 3  (three) times a week.   No facility-administered encounter medications on file as of 12/29/2020.    Allergies (verified) Patient has no known allergies.   History: Past Medical History:  Diagnosis Date  . Allergy   . Cancer [facility])    Uterine Cancer  . Hyperlipidemia   . Hypertension   . Kidney stones 06/21/2018   Past Surgical History:  Procedure Laterality Date  . ABDOMINAL HYSTERECTOMY    . BREAST BIOPSY Right 04/09/2006   negative  . BREAST BIOPSY Left 12/04/2017   fibroadenmatoid change with coarse calcs  . COLONOSCOPY WITH PROPOFOL N/A 10/07/2018   Procedure: COLONOSCOPY WITH PROPOFOL;  Surgeon: [name], MD;  Location: [facility] ENDOSCOPY;  Service: Gastroenterology;  Laterality: N/A;  . LIPOMA EXCISION Left 04/15/2020   Procedure: EXCISION LIPOMA, left arm;  Surgeon: Fredirick Maudlin, MD;  Location: [facility] ORS;  Service: General;  Laterality: Left;   Family History  Problem Relation Age of Onset  . Congestive Heart Failure Mother 12  . Heart attack Father 91  . Sinusitis Sister   . Stomach cancer Sister   . Uterine cancer Sister 35  . Liver cancer Sister   . Pancreatic cancer Sister   . Dementia Sister        54  . Breast cancer Neg Hx    Social History   Socioeconomic History  . Marital status: Married    Spouse name: Paramedic  . Number of children: 2  .  Years of education: some college  . Highest education level: 12th grade  Occupational History  . 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 Smoking status: Former Smoker    Packs/day: 1.00    Years: 2.00    Pack years: 2.00    Types: Cigarettes    Quit date: 1963    Years since quitting: 59.1  . Smokeless tobacco: Never Used  . Tobacco comment: smoking cessation materials not required  Vaping Use  . Vaping Use: Never used  Substance and Sexual Activity  . Alcohol use: No    Alcohol/week: 0.0 standard drinks  . Drug use: No  . Sexual activity: Yes    Partners: Male     Birth control/protection: Post-menopausal  Other Topics Concern  . Not on file  Social History Narrative   Married for 50 years    Social Determinants of Health   Financial Resource Strain: Low Risk   . Difficulty of Paying Living Expenses: Not hard at all  Food Insecurity: No Food Insecurity  . Worried About Charity fundraiser in the Last Year: Never true  . Ran Out of Food in the Last Year: Never true  Transportation Needs: No Transportation Needs  . Lack of Transportation (Medical): No  . Lack of Transportation (Non-Medical): No  Physical Activity: Insufficiently Active  . Days of Exercise per Week: 7 days  . Minutes of Exercise per Session: 20 min  Stress: No Stress Concern Present  . Feeling of Stress : Not at all  Social Connections: Socially Integrated  . Frequency of Communication with Friends and Family: More than three times a week  . Frequency of Social Gatherings with Friends and Family: More than three times a week  . Attends Religious Services: More than 4 times per year  . Active Member of Clubs or Organizations: Yes  . Attends Archivist Meetings: More than 4 times per year  . Marital Status: Married    Tobacco Counseling Counseling given: Not Answered Comment: smoking cessation materials not required   Clinical Intake:  Pre-visit preparation completed: Yes  Pain : No/denies pain     BMI - recorded: 28.22 Nutritional Status: BMI 25 -29 Overweight Nutritional Risks: None Diabetes: No  How often do you need to have someone help you when you read instructions, pamphlets, or other written materials from your doctor or pharmacy?: 1 - Never    Interpreter Needed?: No  Information entered by :: Clemetine Marker LPN   Activities of Daily Living In your present state of health, do you have any difficulty performing the following activities: 12/29/2020 10/11/2020  Hearing? N N  Comment declines hearing aids -  Vision? N N  Difficulty  concentrating or making decisions? N N  Walking or climbing stairs? N N  Dressing or bathing? N N  Doing errands, shopping? N N  Preparing Food and eating ? N -  Using the Toilet? N -  In the past six months, have you accidently leaked urine? Y -  Comment wears liner/pad for protection if needed -  Do you have problems with loss of bowel control? N -  Managing your Medications? N -  Managing your Finances? N -  Housekeeping or managing your Housekeeping? N -  Some recent data might be hidden    Patient Care Team: Steele Sizer, MD as PCP - General (Family Medicine) Earlie Server, MD as Consulting Physician (Oncology) Anthonette Legato, MD (Nephrology) [place], [place]  D, MD as Consulting Physician (Cardiology)  Indicate any recent Medical Services you may have received from other than [facility] providers in the past year (date may be approximate).     Assessment:   This is a routine wellness examination for [place].  Hearing/Vision screen  Hearing Screening   125Hz  250Hz  500Hz  1000Hz  2000Hz  3000Hz  4000Hz  6000Hz  8000Hz   Right ear:           Left ear:           Comments: Pt denies hearing difficulty  Vision Screening Comments: [facility]  Dietary issues and exercise activities discussed: Current Exercise Habits: Home exercise routine, Type of exercise: walking, Time (Minutes): 20, Frequency (Times/Week): 7, Weekly Exercise (Minutes/Week): 140, Intensity: Mild, Exercise limited by: None identified  Goals    . DIET - INCREASE WATER INTAKE     Recommend to drink at least 6-8 8oz glasses of water per day.    . Weight (lb) < 165 lb (74.8 kg)     Pt states she would like to lose 8 lbs over the next few months      Depression Screen PHQ 2/9 Scores 12/29/2020 10/11/2020 07/14/2020 06/22/2020 06/03/2020 04/11/2020 12/29/2019  PHQ - 2 Score 0 0 0 0 0 0 0  PHQ- 9 Score - 0 - 4 0 0 -    Fall Risk Fall Risk  12/29/2020 10/11/2020 07/14/2020 06/22/2020 06/03/2020   Falls in the past year? 0 0 0 0 0  Number falls in past yr: 0 0 0 0 0  Injury with Fall? 0 0 0 0 0  Risk for fall due to : No Fall Risks - - - -  Follow up Falls prevention discussed - - - -    FALL RISK PREVENTION PERTAINING TO THE HOME:  Any stairs in or around the home? Yes  If so, are there any without handrails? No  Home free of loose throw rugs in walkways, pet beds, electrical cords, etc? Yes  Adequate lighting in your home to reduce risk of falls? Yes   ASSISTIVE DEVICES UTILIZED TO PREVENT FALLS:  Life alert? No  Use of a cane, walker or w/c? No  Grab bars in the bathroom? No  Shower chair or bench in shower? No  Elevated toilet seat or a handicapped toilet? No   TIMED UP AND GO:  Was the test performed? Yes .  Length of time to ambulate 10 feet: 5 sec.   Gait steady and fast without use of assistive device  Cognitive Function: Normal cognitive status assessed by direct observation by this Nurse Health Advisor. No abnormalities found.       6CIT Screen 12/29/2019 12/23/2018 12/20/2017  What Year? 0 points 0 points 0 points  What month? 0 points 0 points 0 points  What time? 0 points 0 points 0 points  Count back from 20 0 points 0 points 0 points  Months in reverse 0 points 0 points 0 points  Repeat phrase 2 points 0 points 0 points  Total Score 2 0 0    Immunizations Immunization History  Administered Date(s) Administered  . Fluad Quad(high Dose 65+) 08/28/2019, 07/14/2020  . Influenza, High Dose Seasonal PF 09/23/2015, 08/22/2016, 08/14/2017, 07/25/2018  . Influenza-Unspecified 09/20/2014  . Moderna Sars-Covid-2 Vaccination 12/16/2019, 01/13/2020, 09/05/2020  . Pneumococcal Conjugate-13 01/12/2014  . Pneumococcal Polysaccharide-23 12/28/2014  . Tdap 05/14/2017  . Tetanus 02/25/2007  . Zoster 08/11/2012    TDAP status: Up to date  Flu Vaccine status: Up to date  Pneumococcal vaccine status: Up to date  Covid-19 vaccine status: Completed  vaccines  Qualifies for Shingles Vaccine? Yes   Zostavax completed Yes   Shingrix Completed?: No.    Education has been provided regarding the importance of this vaccine. Patient has been advised to call insurance company to determine out of pocket expense if they have not yet received this vaccine. Advised may also receive vaccine at local pharmacy or Health Dept. Verbalized acceptance and understanding.  Screening Tests Health Maintenance  Topic Date Due  . COVID-19 Vaccine (4 - Booster for Moderna series) 03/05/2021  . COLONOSCOPY (Pts 45-69yrs Insurance coverage will need to be confirmed)  10/07/2021  . MAMMOGRAM  12/19/2021  . TETANUS/TDAP  05/15/2027  . INFLUENZA VACCINE  Completed  . DEXA SCAN  Completed  . Hepatitis C Screening  Completed  . PNA vac Low Risk Adult  Completed    Health Maintenance  There are no preventive care reminders to display for this patient.  Colorectal cancer screening: Type of screening: Colonoscopy. Completed 10/07/18. Repeat every 3 years  Mammogram status: Completed 12/19/20. Repeat every year  Bone Density status: Completed 01/11/20. Results reflect: Bone density results: NORMAL. Repeat every 2 years.  Lung Cancer Screening: (Low Dose CT Chest recommended if Age 56-80 years, 30 pack-year currently smoking OR have quit w/in 15years.) does not qualify.   Additional Screening:  Hepatitis C Screening: does qualify; Completed 08/15/18  Vision Screening: Recommended annual ophthalmology exams for early detection of glaucoma and other disorders of the eye. Is the patient up to date with their annual eye exam?  Yes  Who is the provider or what is the name of the office in which the patient attends annual eye exams? [facility] Screening: Recommended annual dental exams for proper oral hygiene  Community Resource Referral / Chronic Care Management: CRR required this visit?  No   CCM required this visit?  No      Plan:     I  have personally reviewed and noted the following in the patient's chart:   . Medical and social history . Use of alcohol, tobacco or illicit drugs  . Current medications and supplements . Functional ability and status . Nutritional status . Physical activity . Advanced directives . List of other physicians . Hospitalizations, surgeries, and ER visits in previous 12 months . Vitals . Screenings to include cognitive, depression, and falls . Referrals and appointments  In addition, I have reviewed and discussed with patient certain preventive protocols, quality metrics, and best practice recommendations. A written personalized care plan for preventive services as well as general preventive health recommendations were provided to patient.     Clemetine Marker, LPN   [id number]/41/[id number]   Nurse Notes: none

## 2021-01-24 ENCOUNTER — Inpatient Hospital Stay: Payer: Medicare PPO | Attending: Oncology

## 2021-01-24 ENCOUNTER — Other Ambulatory Visit: Payer: Self-pay

## 2021-01-24 DIAGNOSIS — D631 Anemia in chronic kidney disease: Secondary | ICD-10-CM | POA: Diagnosis not present

## 2021-01-24 DIAGNOSIS — Z87442 Personal history of urinary calculi: Secondary | ICD-10-CM | POA: Diagnosis not present

## 2021-01-24 DIAGNOSIS — N1832 Chronic kidney disease, stage 3b: Secondary | ICD-10-CM | POA: Diagnosis not present

## 2021-01-24 DIAGNOSIS — E538 Deficiency of other specified B group vitamins: Secondary | ICD-10-CM | POA: Insufficient documentation

## 2021-01-24 DIAGNOSIS — Z79899 Other long term (current) drug therapy: Secondary | ICD-10-CM | POA: Insufficient documentation

## 2021-01-24 DIAGNOSIS — Z800 Family history of malignant neoplasm of digestive organs: Secondary | ICD-10-CM | POA: Diagnosis not present

## 2021-01-24 DIAGNOSIS — E785 Hyperlipidemia, unspecified: Secondary | ICD-10-CM | POA: Diagnosis not present

## 2021-01-24 DIAGNOSIS — Z87891 Personal history of nicotine dependence: Secondary | ICD-10-CM | POA: Insufficient documentation

## 2021-01-24 DIAGNOSIS — D696 Thrombocytopenia, unspecified: Secondary | ICD-10-CM | POA: Diagnosis not present

## 2021-01-24 DIAGNOSIS — Z8249 Family history of ischemic heart disease and other diseases of the circulatory system: Secondary | ICD-10-CM | POA: Diagnosis not present

## 2021-01-24 DIAGNOSIS — Z8542 Personal history of malignant neoplasm of other parts of uterus: Secondary | ICD-10-CM | POA: Insufficient documentation

## 2021-01-24 DIAGNOSIS — Z818 Family history of other mental and behavioral disorders: Secondary | ICD-10-CM | POA: Diagnosis not present

## 2021-01-24 LAB — CBC WITH DIFFERENTIAL/PLATELET
Abs Immature Granulocytes: 0.01 10*3/uL (ref 0.00–0.07)
Basophils Absolute: 0 10*3/uL (ref 0.0–0.1)
Basophils Relative: 0 %
Eosinophils Absolute: 0.1 10*3/uL (ref 0.0–0.5)
Eosinophils Relative: 2 %
HCT: 35.5 % — ABNORMAL LOW (ref 36.0–46.0)
Hemoglobin: 11.5 g/dL — ABNORMAL LOW (ref 12.0–15.0)
Immature Granulocytes: 0 %
Lymphocytes Relative: 40 %
Lymphs Abs: 2.3 10*3/uL (ref 0.7–4.0)
MCH: 29.3 pg (ref 26.0–34.0)
MCHC: 32.4 g/dL (ref 30.0–36.0)
MCV: 90.6 fL (ref 80.0–100.0)
Monocytes Absolute: 0.5 10*3/uL (ref 0.1–1.0)
Monocytes Relative: 9 %
Neutro Abs: 2.9 10*3/uL (ref 1.7–7.7)
Neutrophils Relative %: 49 %
Platelets: 141 10*3/uL — ABNORMAL LOW (ref 150–400)
RBC: 3.92 MIL/uL (ref 3.87–5.11)
RDW: 15.9 % — ABNORMAL HIGH (ref 11.5–15.5)
WBC: 5.9 10*3/uL (ref 4.0–10.5)
nRBC: 0 % (ref 0.0–0.2)

## 2021-01-24 LAB — IRON AND TIBC
Iron: 78 ug/dL (ref 28–170)
Saturation Ratios: 21 % (ref 10.4–31.8)
TIBC: 370 ug/dL (ref 250–450)
UIBC: 292 ug/dL

## 2021-01-24 LAB — COMPREHENSIVE METABOLIC PANEL
ALT: 15 U/L (ref 0–44)
AST: 23 U/L (ref 15–41)
Albumin: 3.9 g/dL (ref 3.5–5.0)
Alkaline Phosphatase: 51 U/L (ref 38–126)
Anion gap: 7 (ref 5–15)
BUN: 17 mg/dL (ref 8–23)
CO2: 28 mmol/L (ref 22–32)
Calcium: 9.5 mg/dL (ref 8.9–10.3)
Chloride: 103 mmol/L (ref 98–111)
Creatinine, Ser: 1.56 mg/dL — ABNORMAL HIGH (ref 0.44–1.00)
GFR, Estimated: 35 mL/min — ABNORMAL LOW (ref >60)
Glucose, Bld: 94 mg/dL (ref 70–99)
Potassium: 4.1 mmol/L (ref 3.5–5.1)
Sodium: 138 mmol/L (ref 135–145)
Total Bilirubin: 0.7 mg/dL (ref 0.3–1.2)
Total Protein: 7.6 g/dL (ref 6.5–8.1)

## 2021-01-24 LAB — FERRITIN: Ferritin: 14 ng/mL (ref 11–307)

## 2021-01-24 LAB — VITAMIN B12: Vitamin B-12: 248 pg/mL (ref 180–914)

## 2021-01-26 ENCOUNTER — Inpatient Hospital Stay: Payer: Medicare PPO

## 2021-01-26 ENCOUNTER — Inpatient Hospital Stay (HOSPITAL_BASED_OUTPATIENT_CLINIC_OR_DEPARTMENT_OTHER): Payer: Medicare PPO | Admitting: Oncology

## 2021-01-26 ENCOUNTER — Encounter: Payer: Self-pay | Admitting: Oncology

## 2021-01-26 VITALS — BP 140/79 | HR 69 | Temp 96.5°F | Wt 169.0 lb

## 2021-01-26 DIAGNOSIS — D631 Anemia in chronic kidney disease: Secondary | ICD-10-CM | POA: Diagnosis not present

## 2021-01-26 DIAGNOSIS — E538 Deficiency of other specified B group vitamins: Secondary | ICD-10-CM

## 2021-01-26 DIAGNOSIS — D696 Thrombocytopenia, unspecified: Secondary | ICD-10-CM

## 2021-01-26 DIAGNOSIS — Z79899 Other long term (current) drug therapy: Secondary | ICD-10-CM | POA: Diagnosis not present

## 2021-01-26 DIAGNOSIS — E785 Hyperlipidemia, unspecified: Secondary | ICD-10-CM | POA: Diagnosis not present

## 2021-01-26 DIAGNOSIS — Z8542 Personal history of malignant neoplasm of other parts of uterus: Secondary | ICD-10-CM | POA: Diagnosis not present

## 2021-01-26 DIAGNOSIS — N1832 Chronic kidney disease, stage 3b: Secondary | ICD-10-CM

## 2021-01-26 DIAGNOSIS — Z87891 Personal history of nicotine dependence: Secondary | ICD-10-CM | POA: Diagnosis not present

## 2021-01-26 DIAGNOSIS — Z87442 Personal history of urinary calculi: Secondary | ICD-10-CM | POA: Diagnosis not present

## 2021-01-26 MED ORDER — CYANOCOBALAMIN 1000 MCG/ML IJ SOLN
1000.0000 ug | Freq: Once | INTRAMUSCULAR | Status: AC
  Administered 2021-01-26: 1000 ug via INTRAMUSCULAR
  Filled 2021-01-26: qty 1

## 2021-01-26 NOTE — Progress Notes (Signed)
Hematology/Oncology Consult note [facility] [phone] [phone]   Patient Care Team: Steele Sizer, MD as PCP - General (Family Medicine) Earlie Server, MD as Consulting Physician (Oncology) Anthonette Legato, MD (Nephrology) Yolonda Kida, MD as Consulting Physician (Cardiology)  REFERRING PROVIDER: Dr.Lateef REASON FOR VISIT Follow up for management of abnormal UPEP  HISTORY OF PRESENTING ILLNESS:  Morgan Livingston is a  75 y.o.  female with PMH listed below who was referred to me for evaluation of abnormal UPEP.   She has CKD and follows up with Dr.Lateef. History of HTN. Reports doing well, denies bone pain.  Fatigue:  Chronic onset, perisistent, no aggravating or improving factors, no associated symptoms.  Reviewed labs which was done at [facility] office.  03/05/2018 SPEP done at [place] showed negative for M Spike UPEP: M spike 16.4  # colonoscopy done on 12 02/05/2016 with findings of polyps which were resected and retrieved.  Pathology showed negative for high-grade dysplasia or malignancy.  + Tubular adenoma   INTERVAL HISTORY Morgan Livingston is a 75 y.o. female who has above history reviewed by me today presents for follow up visit for management of thrombocytopenia and vitamin B12 deficiency,  Patient has no complaints today. Vitamin B12 level was high last year.  Recommend patient to take vitamin B12 supplementation twice a week.  Patient reports that she has been taking B12 intermittently since then.   Review of Systems  Constitutional: Positive for malaise/fatigue. Negative for chills, fever and weight loss.  HENT: Negative for sore throat.   Eyes: Negative for redness.  Respiratory: Negative for cough, shortness of breath and wheezing.   Cardiovascular: Negative for chest pain, palpitations and leg swelling.  Gastrointestinal: Negative for abdominal pain, blood in stool, heartburn, nausea and  vomiting.  Genitourinary: Negative for dysuria.  Musculoskeletal: Negative for myalgias.  Skin: Negative for rash.  Neurological: Negative for dizziness, tingling and tremors. Seizures:    Endo/Heme/Allergies: Does not bruise/bleed easily.  Psychiatric/Behavioral: Negative for hallucinations.    MEDICAL HISTORY:  Past Medical History:  Diagnosis Date  . Allergy   . Cancer [facility])    Uterine Cancer  . Hyperlipidemia   . Hypertension   . Kidney stones 06/21/2018    SURGICAL HISTORY: Past Surgical History:  Procedure Laterality Date  . ABDOMINAL HYSTERECTOMY    . BREAST BIOPSY Right 04/09/2006   negative  . BREAST BIOPSY Left 12/04/2017   fibroadenmatoid change with coarse calcs  . COLONOSCOPY WITH PROPOFOL N/A 10/07/2018   Procedure: COLONOSCOPY WITH PROPOFOL;  Surgeon: [name], MD;  Location: [facility] ENDOSCOPY;  Service: Gastroenterology;  Laterality: N/A;  . LIPOMA EXCISION Left 04/15/2020   Procedure: EXCISION LIPOMA, left arm;  Surgeon: Fredirick Maudlin, MD;  Location: [facility] ORS;  Service: General;  Laterality: Left;    SOCIAL HISTORY: Social History   Socioeconomic History  . Marital status: Married    Spouse name: Paramedic  . Number of children: 2  . Years of education: some college  . Highest education level: 12th grade  Occupational History  . 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 Smoking status: Former Smoker    Packs/day: 1.00    Years: 2.00    Pack years: 2.00    Types: Cigarettes    Quit date: 1963    Years since quitting: 59.2  . Smokeless tobacco: Never Used  . Tobacco comment: smoking cessation materials not required  Vaping Use  .  Vaping Use: Never used  Substance and Sexual Activity  . Alcohol use: No    Alcohol/week: 0.0 standard drinks  . Drug use: No  . Sexual activity: Yes    Partners: Male    Birth control/protection: Post-menopausal  Other Topics Concern  . Not on file  Social History Narrative   Married  for 50 years    Social Determinants of Health   Financial Resource Strain: Low Risk   . Difficulty of Paying Living Expenses: Not hard at all  Food Insecurity: No Food Insecurity  . Worried About Charity fundraiser in the Last Year: Never true  . Ran Out of Food in the Last Year: Never true  Transportation Needs: No Transportation Needs  . Lack of Transportation (Medical): No  . Lack of Transportation (Non-Medical): No  Physical Activity: Insufficiently Active  . Days of Exercise per Week: 7 days  . Minutes of Exercise per Session: 20 min  Stress: No Stress Concern Present  . Feeling of Stress : Not at all  Social Connections: Socially Integrated  . Frequency of Communication with Friends and Family: More than three times a week  . Frequency of Social Gatherings with Friends and Family: More than three times a week  . Attends Religious Services: More than 4 times per year  . Active Member of Clubs or Organizations: Yes  . Attends Archivist Meetings: More than 4 times per year  . Marital Status: Married  Human resources officer Violence: Not At Risk  . Fear of Current or Ex-Partner: No  . Emotionally Abused: No  . Physically Abused: No  . Sexually Abused: No    FAMILY HISTORY: Family History  Problem Relation Age of Onset  . Congestive Heart Failure Mother 40  . Heart attack Father 64  . Sinusitis Sister   . Stomach cancer Sister   . Uterine cancer Sister 6  . Liver cancer Sister   . Pancreatic cancer Sister   . Dementia Sister        45  . Breast cancer Neg Hx     ALLERGIES:  has No Known Allergies.  MEDICATIONS:  Current Outpatient Medications  Medication Sig Dispense Refill  . acetaminophen (TYLENOL) 500 MG tablet Take 2 tablets (1,000 mg total) by mouth every 6 (six) hours as needed for mild pain or headache. 30 tablet 0  . aspirin 81 MG tablet Take 81 mg by mouth daily.    . Biotin 5 MG CAPS Take by mouth daily.    . carboxymethylcellulose (REFRESH  PLUS) 0.5 % SOLN Place 1 drop into both eyes daily.    . cholecalciferol (VITAMIN D) 1000 UNITS tablet Take 1,000 Units by mouth daily.     . fexofenadine (ALLEGRA) 180 MG tablet Take 180 mg by mouth daily.    Marland Kitchen losartan-hydrochlorothiazide (HYZAAR) 50-12.5 MG tablet Take 1 tablet by mouth daily. 90 tablet 1  . pantoprazole (PROTONIX) 40 MG tablet Take 1 tablet (40 mg total) by mouth daily. 90 tablet 0  . rosuvastatin (CRESTOR) 20 MG tablet Take 1 tablet (20 mg total) by mouth at bedtime. 90 tablet 1  . vitamin B-12 (CYANOCOBALAMIN) 1000 MCG tablet Take 1 tablet (1,000 mcg total) by mouth daily. (Patient taking differently: Take 1,000 mcg by mouth once a week. Wednesday) 90 tablet 1  . vitamin E 1000 UNIT capsule Take 1,000 Units by mouth 3 (three) times a week.     No current facility-administered medications for this visit.  PHYSICAL EXAMINATION: ECOG PERFORMANCE STATUS: 1 - Symptomatic but completely ambulatory Vitals:   01/26/21 0953  BP: 140/79  Pulse: 69  Temp: (!) 96.5 F (35.8 C)   Filed Weights   01/26/21 0953  Weight: 169 lb (76.7 kg)    Physical Exam Constitutional:      General: She is not in acute distress. HENT:     Head: Normocephalic and atraumatic.  Eyes:     General: No scleral icterus.    Conjunctiva/sclera: Conjunctivae normal.     Pupils: Pupils are equal, round, and reactive to light.  Cardiovascular:     Rate and Rhythm: Normal rate and regular rhythm.     Heart sounds: Normal heart sounds.  Pulmonary:     Effort: Pulmonary effort is normal. No respiratory distress.     Breath sounds: Normal breath sounds. No wheezing or rales.  Chest:     Chest wall: No tenderness.  Abdominal:     General: Bowel sounds are normal. There is no distension.     Palpations: Abdomen is soft. There is no mass.     Tenderness: There is no abdominal tenderness.  Musculoskeletal:        General: No deformity. Normal range of motion.     Cervical back: Normal range  of motion and neck supple.  Lymphadenopathy:     Cervical: No cervical adenopathy.  Skin:    General: Skin is warm and dry.     Findings: No erythema or rash.  Neurological:     Mental Status: She is alert and oriented to person, place, and time.     Cranial Nerves: No cranial nerve deficit.     Coordination: Coordination normal.  Psychiatric:        Behavior: Behavior normal.        Thought Content: Thought content normal.      LABORATORY DATA:  I have reviewed the data as listed Lab Results  Component Value Date   WBC 5.9 01/24/2021   HGB 11.5 (L) 01/24/2021   HCT 35.5 (L) 01/24/2021   MCV 90.6 01/24/2021   PLT 141 (L) 01/24/2021   Recent Labs    06/22/20 1129 01/24/21 1105  NA 141 138  K 3.6 4.1  CL 100 103  CO2 32 28  GLUCOSE 108* 94  BUN 14 17  CREATININE 1.72* 1.56*  CALCIUM 10.1 9.5  GFRNONAA 29* 35*  GFRAA 34*  --   PROT 7.0 7.6  ALBUMIN  --  3.9  AST 21 23  ALT 13 15  ALKPHOS  --  51  BILITOT 0.4 0.7   Iron/TIBC/Ferritin/ %Sat    Component Value Date/Time   IRON 78 01/24/2021 1105   IRON 76 05/23/2015 0914   TIBC 370 01/24/2021 1105   TIBC 276 05/23/2015 0914   FERRITIN 14 01/24/2021 1105   FERRITIN 57 05/23/2015 0914   IRONPCTSAT 21 01/24/2021 1105   IRONPCTSAT 28 05/23/2015 0914    04/17/2018  SPEP no M spike. Normal serum free light chain ratio. UPEP, no M spike, elevated free kappa/lamda ratio at 20.75.   08/12/2018 UPEP no M spike  ASSESSMENT & PLAN:  1. Thrombocytopenia ([place])   2. B12 deficiency   3. Anemia in stage 3b chronic kidney disease ([place])    Labs are reviewed and discussed with patient. History of vitamin B12 deficiency history B12 level now is at 248.  Proceed with vitamin B12 intramuscular injection  1000 MCG today Advised patient to take oral vitamin B12 supplementation  1000 MCG daily.    Chronic thrombocytopenia, platelet counts are stable.  Patient has had previous work-up done including negative hepatitis, HIV.   Platelet count has been stable at 141,000.  Anemia in stage III CKD, hemoglobin has been stable.  Recommend patient to take vitron C 1 tablet daily.  Patient will follow up lab encounter in 3 months, vitamin B12 level in the parietal antibody, intrinsic antibody .  Follow-up lab MD in 6 months-CBC, iron TIBC ferritin, B12. Orders Placed This Encounter  Procedures  . Vitamin B12    Standing Status:   Future    Standing Expiration Date:   01/26/2022  . CBC with Differential/Platelet    Standing Status:   Future    Standing Expiration Date:   01/26/2022  . Iron and TIBC    Standing Status:   Future    Standing Expiration Date:   01/26/2022  . Ferritin    Standing Status:   Future    Standing Expiration Date:   01/26/2022  . Vitamin B12    Standing Status:   Future    Standing Expiration Date:   01/26/2022    All questions were answered. The patient knows to call the clinic with any problems questions or concerns.   Earlie Server, MD, PhD Hematology Oncology [facility] at [facility] Pager- [phone] 01/26/2021

## 2021-02-02 DIAGNOSIS — N2581 Secondary hyperparathyroidism of renal origin: Secondary | ICD-10-CM | POA: Diagnosis not present

## 2021-02-02 DIAGNOSIS — R809 Proteinuria, unspecified: Secondary | ICD-10-CM | POA: Diagnosis not present

## 2021-02-02 DIAGNOSIS — N1832 Chronic kidney disease, stage 3b: Secondary | ICD-10-CM | POA: Diagnosis not present

## 2021-02-02 DIAGNOSIS — I10 Essential (primary) hypertension: Secondary | ICD-10-CM | POA: Diagnosis not present

## 2021-02-13 DIAGNOSIS — H40003 Preglaucoma, unspecified, bilateral: Secondary | ICD-10-CM | POA: Diagnosis not present

## 2021-02-21 ENCOUNTER — Other Ambulatory Visit: Payer: Self-pay | Admitting: Family Medicine

## 2021-02-21 DIAGNOSIS — I10 Essential (primary) hypertension: Secondary | ICD-10-CM

## 2021-04-11 ENCOUNTER — Ambulatory Visit: Payer: Medicare PPO | Admitting: Family Medicine

## 2021-04-18 ENCOUNTER — Other Ambulatory Visit: Payer: Self-pay

## 2021-04-18 ENCOUNTER — Encounter: Payer: Self-pay | Admitting: Family Medicine

## 2021-04-18 ENCOUNTER — Ambulatory Visit: Payer: Medicare PPO | Admitting: Family Medicine

## 2021-04-18 VITALS — BP 128/72 | HR 70 | Temp 98.0°F | Resp 16 | Ht 65.0 in | Wt 168.0 lb

## 2021-04-18 DIAGNOSIS — H539 Unspecified visual disturbance: Secondary | ICD-10-CM

## 2021-04-18 DIAGNOSIS — R739 Hyperglycemia, unspecified: Secondary | ICD-10-CM

## 2021-04-18 DIAGNOSIS — I10 Essential (primary) hypertension: Secondary | ICD-10-CM

## 2021-04-18 DIAGNOSIS — N2581 Secondary hyperparathyroidism of renal origin: Secondary | ICD-10-CM | POA: Diagnosis not present

## 2021-04-18 DIAGNOSIS — D472 Monoclonal gammopathy: Secondary | ICD-10-CM | POA: Diagnosis not present

## 2021-04-18 DIAGNOSIS — I700 Atherosclerosis of aorta: Secondary | ICD-10-CM

## 2021-04-18 DIAGNOSIS — N1832 Chronic kidney disease, stage 3b: Secondary | ICD-10-CM

## 2021-04-18 DIAGNOSIS — E785 Hyperlipidemia, unspecified: Secondary | ICD-10-CM | POA: Diagnosis not present

## 2021-04-18 DIAGNOSIS — E538 Deficiency of other specified B group vitamins: Secondary | ICD-10-CM | POA: Diagnosis not present

## 2021-04-18 DIAGNOSIS — E559 Vitamin D deficiency, unspecified: Secondary | ICD-10-CM

## 2021-04-18 DIAGNOSIS — D691 Qualitative platelet defects: Secondary | ICD-10-CM

## 2021-04-18 MED ORDER — LOSARTAN POTASSIUM-HCTZ 50-12.5 MG PO TABS: 1.0000 | ORAL_TABLET | Freq: Every day | ORAL | 1 refills | Status: DC

## 2021-04-18 MED ORDER — ROSUVASTATIN CALCIUM 20 MG PO TABS: 20.0000 mg | ORAL_TABLET | Freq: Every day | ORAL | 1 refills | Status: DC

## 2021-04-18 NOTE — Progress Notes (Signed)
Name: Morgan Livingston   MRN: [id number]    DOB: Sep 18, 1946   Date:04/18/2021       Progress Note  Subjective  Chief Complaint  Follow Up  HPI  HTN: she is back on medication losartan/hctz but down to 50/12.5 , bp is at goal today, she states bp at home can go up to 140/80's, she has some dizziness when she gets up but states improves when she eats, advised to check glucose when she feels dizzy, seems to be unrelated to bp   B12 deficiency /Thrombocytopenia/MUGS  : under the care of [name], last B12 was low again, it was checked in March, she is back on supplementation.  She has a history of anemia and thrombocytopenia but stable based on last levels.    Atherosclerosis aorta/dyslipidemia: found on X-ray of lumbar spine, she does not have claudication. She will continue statin and aspirin daily , last LDL was above 100, we discussed again adjusting medications but she is  not wiling at this time    Proteinuria and CKI stage III: seeing [name], she had secondary hyperparathyroidism,and vitamin D deficiency. She denies cramping, pruritis or decrease in urine output  .Last GFR was stable CKI stage III - GFR down to 35 during lab draw in March, no protein in the urine during visit in March [name]    Positive for Sjogren's Anti SS-A and also ANA: on labs done 03/2018 by [name] , 03/2018 showed negative for M Spike UPEP spike and monitored by  [name]. She denies dry mouth , but states she has a long history of dry eyes and not changed - uses eye drops. She denies joint aches or effusion , no rashes .   Vision changes: noticed a white film on right upper corner or her eye, comes and goes over the past few days. No blind spots. Advised to go back to eye doctor this week.    Hyperglycemia: no family history of diabetes, A1C was as high at 6.4 % but down to 6.2% Dec 2021 avoiding sweets even though she likes to bake, trying to eat more fruit than desserts.    Chronic back pain: she is working  on her posture, she states symptoms are mild, sometimes in am's but resolves when she walks   Patient Active Problem List   Diagnosis Date Noted   History of IBS 06/03/2020   Mass of arm, left    MGUS (monoclonal gammopathy of unknown significance) 09/01/2018   Secondary hyperparathyroidism ([place]) 09/01/2018   B12 deficiency 08/19/2018   Atherosclerosis of aorta ([place]) 07/25/2018   Thrombocytopenia ([place]) 05/09/2018   Proteinuria 05/09/2018   Positive ANA (antinuclear antibody) 04/02/2018   Bilateral dry eyes 04/02/2018   History of hysterectomy 05/23/2015   Chronic kidney disease, stage III (moderate) ([place]) 05/23/2015   Benign hypertension 05/22/2015   Chronic constipation 05/22/2015   DDD (degenerative disc disease), lumbar 05/22/2015   DD (diverticular disease) 05/22/2015   Dyslipidemia 05/22/2015   History of cervical cancer 52/77/8242   Helicobacter pylori gastrointestinal tract infection 05/22/2015   Blood glucose elevated 05/22/2015   Overweight (BMI 25.0-29.9) 05/22/2015   Perennial allergic rhinitis with seasonal variation 05/22/2015   Vitamin D deficiency 05/22/2015   Impingement syndrome of shoulder 01/14/2015    Past Surgical History:  Procedure Laterality Date   ABDOMINAL HYSTERECTOMY     BREAST BIOPSY Right 04/09/2006   negative   BREAST BIOPSY Left 12/04/2017   fibroadenmatoid change with coarse calcs  COLONOSCOPY WITH PROPOFOL N/A 10/07/2018   Procedure: COLONOSCOPY WITH PROPOFOL;  Surgeon: [name], MD;  Location: [facility] ENDOSCOPY;  Service: Gastroenterology;  Laterality: N/A;   LIPOMA EXCISION Left 04/15/2020   Procedure: EXCISION LIPOMA, left arm;  Surgeon: Fredirick Maudlin, MD;  Location: [facility] ORS;  Service: General;  Laterality: Left;    Family History  Problem Relation Age of Onset   Congestive Heart Failure Mother 22   Heart attack Father 44   Sinusitis Sister    Stomach cancer Sister    Uterine cancer Sister 76   Liver cancer Sister    Pancreatic  cancer Sister    Dementia Sister        16   Breast cancer Neg Hx     Social History   Tobacco Use   Smoking status: Former    Packs/day: 1.00    Years: 2.00    Pack years: 2.00    Types: Cigarettes    Quit date: 1963    Years since quitting: 59.4   Smokeless tobacco: Never   Tobacco comments:    smoking cessation materials not required  Substance Use Topics   Alcohol use: No    Alcohol/week: 0.0 standard drinks     Current Outpatient Medications:    acetaminophen (TYLENOL) 500 MG tablet, Take 2 tablets (1,000 mg total) by mouth every 6 (six) hours as needed for mild pain or headache., Disp: 30 tablet, Rfl: 0   aspirin 81 MG tablet, Take 81 mg by mouth daily., Disp: , Rfl:    Biotin 5 MG CAPS, Take by mouth daily., Disp: , Rfl:    carboxymethylcellulose (REFRESH PLUS) 0.5 % SOLN, Place 1 drop into both eyes daily., Disp: , Rfl:    cholecalciferol (VITAMIN D) 1000 UNITS tablet, Take 1,000 Units by mouth daily. , Disp: , Rfl:    fexofenadine (ALLEGRA) 180 MG tablet, Take 180 mg by mouth daily., Disp: , Rfl:    vitamin B-12 (CYANOCOBALAMIN) 1000 MCG tablet, Take 1 tablet (1,000 mcg total) by mouth daily. (Patient taking differently: Take 1,000 mcg by mouth once a week. Wednesday), Disp: 90 tablet, Rfl: 1   vitamin E 1000 UNIT capsule, Take 1,000 Units by mouth 3 (three) times a week., Disp: , Rfl:    losartan-hydrochlorothiazide (HYZAAR) 50-12.5 MG tablet, Take 1 tablet by mouth daily., Disp: 90 tablet, Rfl: 1   rosuvastatin (CRESTOR) 20 MG tablet, Take 1 tablet (20 mg total) by mouth at bedtime., Disp: 90 tablet, Rfl: 1  No Known Allergies  I personally reviewed active problem list, medication list, allergies, family history, social history, health maintenance with the patient/caregiver today.   ROS  Constitutional: Negative for fever or weight change. Respiratory: Negative for cough and shortness of breath.   Cardiovascular: Negative for chest pain or palpitations.   Gastrointestinal: Negative for abdominal pain, no bowel changes.  Musculoskeletal: Negative for gait problem or joint swelling.  Skin: Negative for rash.  Neurological: Negative for headache.  No other specific complaints in a complete review of systems (except as listed in HPI above).   Objective  Vitals:   04/18/21 0826  BP: 128/72  Pulse: 70  Resp: 16  Temp: 98 F (36.7 C)  TempSrc: Oral  SpO2: 99%  Weight: 168 lb (76.2 kg)  Height: 5\' 5"  (1.651 m)    Body mass index is 27.96 kg/m.  Physical Exam  Constitutional: Patient appears well-developed and well-nourished. No distress.  HEENT: head atraumatic, normocephalic, pupils equal and reactive to light, neck  supple Cardiovascular: Normal rate, regular rhythm and normal heart sounds.  No murmur heard. No BLE edema. Pulmonary/Chest: Effort normal and breath sounds normal. No respiratory distress. Abdominal: Soft.  There is no tenderness. Psychiatric: Patient has a normal mood and affect. behavior is normal. Judgment and thought content normal.   Recent Results (from the past 2160 hour(s))  Vitamin B12     Status: None   Collection Time: 01/24/21 11:05 AM  Result Value Ref Range   Vitamin B-12 248 180 - 914 pg/mL    Comment: (NOTE) This assay is not validated for testing neonatal or myeloproliferative syndrome specimens for Vitamin B12 levels. Performed at [facility], [place] [address], [place], [place] [address]   Ferritin     Status: None   Collection Time: 01/24/21 11:05 AM  Result Value Ref Range   Ferritin 14 11 - 307 ng/mL    Comment: Performed at [facility], [address] [place], [place] [address]  Comprehensive metabolic panel     Status: Abnormal   Collection Time: 01/24/21 11:05 AM  Result Value Ref Range   Sodium 138 135 - 145 mmol/L   Potassium 4.1 3.5 - 5.1 mmol/L   Chloride 103 98 - 111 mmol/L   CO2 28 22 - 32 mmol/L   Glucose, Bld 94 70 - 99 mg/dL    Comment: Glucose reference  range applies only to samples taken after fasting for at least 8 hours.   BUN 17 8 - 23 mg/dL   Creatinine, Ser 1.56 (H) 0.44 - 1.00 mg/dL   Calcium 9.5 8.9 - 10.3 mg/dL   Total Protein 7.6 6.5 - 8.1 g/dL   Albumin 3.9 3.5 - 5.0 g/dL   AST 23 15 - 41 U/L   ALT 15 0 - 44 U/L   Alkaline Phosphatase 51 38 - 126 U/L   Total Bilirubin 0.7 0.3 - 1.2 mg/dL   GFR, Estimated 35 (L) >60 mL/min    Comment: (NOTE) Calculated using the CKD-EPI Creatinine Equation (2021)    Anion gap 7 5 - 15    Comment: Performed at [facility], [address] [place], [place] [address]  Iron and TIBC     Status: None   Collection Time: 01/24/21 11:05 AM  Result Value Ref Range   Iron 78 28 - 170 ug/dL   TIBC 370 250 - 450 ug/dL   Saturation Ratios 21 10.4 - 31.8 %   UIBC 292 ug/dL    Comment: Performed at [facility], [address] [place],  [address]  CBC with Differential/Platelet     Status: Abnormal   Collection Time: 01/24/21 11:05 AM  Result Value Ref Range   WBC 5.9 4.0 - 10.5 K/uL   RBC 3.92 3.87 - 5.11 MIL/uL   Hemoglobin 11.5 (L) 12.0 - 15.0 g/dL   HCT 35.5 (L) 36.0 - 46.0 %   MCV 90.6 80.0 - 100.0 fL   MCH 29.3 26.0 - 34.0 pg   MCHC 32.4 30.0 - 36.0 g/dL   RDW 15.9 (H) 11.5 - 15.5 %   Platelets 141 (L) 150 - 400 K/uL   nRBC 0.0 0.0 - 0.2 %   Neutrophils Relative % 49 %   Neutro Abs 2.9 1.7 - 7.7 K/uL   Lymphocytes Relative 40 %   Lymphs Abs 2.3 0.7 - 4.0 K/uL   Monocytes Relative 9 %   Monocytes Absolute 0.5 0.1 - 1.0 K/uL   Eosinophils Relative 2 %  Eosinophils Absolute 0.1 0.0 - 0.5 K/uL   Basophils Relative 0 %   Basophils Absolute 0.0 0.0 - 0.1 K/uL   Immature Granulocytes 0 %   Abs Immature Granulocytes 0.01 0.00 - 0.07 K/uL    Comment: Performed at [facility], [address] [place], [place] [address]     PHQ2/9: Depression screen [facility] 2/9 04/18/2021 12/29/2020 10/11/2020 07/14/2020 06/22/2020  Decreased Interest 0 0 0 0 0  Down, Depressed,  Hopeless 0 0 0 0 0  PHQ - 2 Score 0 0 0 0 0  Altered sleeping - - 0 - 0  Tired, decreased energy - - 0 - 1  Change in appetite - - 0 - 3  Feeling bad or failure about yourself  - - 0 - 0  Trouble concentrating - - 0 - 0  Moving slowly or fidgety/restless - - 0 - 0  Suicidal thoughts - - 0 - 0  PHQ-9 Score - - 0 - 4  Difficult doing work/chores - - - - Not difficult at all  Some recent data might be hidden    phq 9 is negative   Fall Risk: Fall Risk  04/18/2021 12/29/2020 10/11/2020 07/14/2020 06/22/2020  Falls in the past year? 0 0 0 0 0  Number falls in past yr: 0 0 0 0 0  Injury with Fall? 0 0 0 0 0  Risk for fall due to : - No Fall Risks - - -  Follow up - Falls prevention discussed - - -     Functional Status Survey: Is the patient deaf or have difficulty hearing?: No Does the patient have difficulty seeing, even when wearing glasses/contacts?: No Does the patient have difficulty concentrating, remembering, or making decisions?: No Does the patient have difficulty walking or climbing stairs?: No Does the patient have difficulty dressing or bathing?: No Does the patient have difficulty doing errands alone such as visiting a doctor's office or shopping?: No    Assessment & Plan  1. Dyslipidemia  - rosuvastatin (CRESTOR) 20 MG tablet; Take 1 tablet (20 mg total) by mouth at bedtime.  Dispense: 90 tablet; Refill: 1  2. Essential hypertension  - losartan-hydrochlorothiazide (HYZAAR) 50-12.5 MG tablet; Take 1 tablet by mouth daily.  Dispense: 90 tablet; Refill: 1  3. Vitamin D deficiency  Continue supplementation   4. Atherosclerosis of aorta ([facility])  - Lipid panel - rosuvastatin (CRESTOR) 20 MG tablet; Take 1 tablet (20 mg total) by mouth at bedtime.  Dispense: 90 tablet; Refill: 1  5. MGUS (monoclonal gammopathy of unknown significance)   6. Secondary hyperparathyroidism ([place])  Checked by nephrologist   7. Thrombocytopathia ([facility])  Stable  8. Stage 3b chronic  kidney disease ([place])  Keep follow up with [name]   9. B12 deficiency   10. Hyperglycemia  - Hemoglobin A1c  11. Vision changes

## 2021-04-19 LAB — LIPID PANEL
Cholesterol: 224 mg/dL — ABNORMAL HIGH (ref <200)
HDL: 50 mg/dL (ref >=50)
LDL Cholesterol (Calc): 147 mg/dL (calc) — ABNORMAL HIGH
Non-HDL Cholesterol (Calc): 174 mg/dL (calc) — ABNORMAL HIGH (ref <130)
Total CHOL/HDL Ratio: 4.5 (calc) (ref <5.0)
Triglycerides: 141 mg/dL (ref <150)

## 2021-04-19 LAB — HEMOGLOBIN A1C
Hgb A1c MFr Bld: 6 % of total Hgb — ABNORMAL HIGH (ref <5.7)
Mean Plasma Glucose: 126 mg/dL
eAG (mmol/L): 7 mmol/L

## 2021-04-24 DIAGNOSIS — Z0100 Encounter for examination of eyes and vision without abnormal findings: Secondary | ICD-10-CM | POA: Diagnosis not present

## 2021-04-24 DIAGNOSIS — H43813 Vitreous degeneration, bilateral: Secondary | ICD-10-CM | POA: Diagnosis not present

## 2021-04-27 ENCOUNTER — Other Ambulatory Visit: Payer: Self-pay

## 2021-04-27 DIAGNOSIS — E538 Deficiency of other specified B group vitamins: Secondary | ICD-10-CM

## 2021-04-28 ENCOUNTER — Other Ambulatory Visit: Payer: Self-pay

## 2021-04-28 ENCOUNTER — Inpatient Hospital Stay: Payer: Medicare PPO | Attending: Oncology

## 2021-04-28 DIAGNOSIS — E538 Deficiency of other specified B group vitamins: Secondary | ICD-10-CM | POA: Insufficient documentation

## 2021-04-28 DIAGNOSIS — D696 Thrombocytopenia, unspecified: Secondary | ICD-10-CM

## 2021-04-28 DIAGNOSIS — D631 Anemia in chronic kidney disease: Secondary | ICD-10-CM | POA: Diagnosis not present

## 2021-04-28 DIAGNOSIS — N1832 Chronic kidney disease, stage 3b: Secondary | ICD-10-CM | POA: Diagnosis not present

## 2021-04-28 LAB — VITAMIN B12: Vitamin B-12: 743 pg/mL (ref 180–914)

## 2021-04-29 LAB — ANTI-PARIETAL ANTIBODY: Parietal Cell Antibody-IgG: 2.8 Units (ref 0.0–20.0)

## 2021-04-29 LAB — INTRINSIC FACTOR ANTIBODIES: Intrinsic Factor: 1.1 AU/mL (ref 0.0–1.1)

## 2021-06-12 DIAGNOSIS — N2581 Secondary hyperparathyroidism of renal origin: Secondary | ICD-10-CM | POA: Diagnosis not present

## 2021-06-12 DIAGNOSIS — R809 Proteinuria, unspecified: Secondary | ICD-10-CM | POA: Diagnosis not present

## 2021-06-12 DIAGNOSIS — I10 Essential (primary) hypertension: Secondary | ICD-10-CM | POA: Diagnosis not present

## 2021-06-12 DIAGNOSIS — N1832 Chronic kidney disease, stage 3b: Secondary | ICD-10-CM | POA: Diagnosis not present

## 2021-07-13 DIAGNOSIS — R42 Dizziness and giddiness: Secondary | ICD-10-CM | POA: Diagnosis not present

## 2021-07-13 DIAGNOSIS — R9431 Abnormal electrocardiogram [ECG] [EKG]: Secondary | ICD-10-CM | POA: Diagnosis not present

## 2021-07-13 DIAGNOSIS — I10 Essential (primary) hypertension: Secondary | ICD-10-CM | POA: Diagnosis not present

## 2021-07-13 DIAGNOSIS — E782 Mixed hyperlipidemia: Secondary | ICD-10-CM | POA: Diagnosis not present

## 2021-07-13 DIAGNOSIS — N183 Chronic kidney disease, stage 3 unspecified: Secondary | ICD-10-CM | POA: Diagnosis not present

## 2021-07-13 DIAGNOSIS — J329 Chronic sinusitis, unspecified: Secondary | ICD-10-CM | POA: Diagnosis not present

## 2021-07-13 DIAGNOSIS — R002 Palpitations: Secondary | ICD-10-CM | POA: Diagnosis not present

## 2021-07-28 ENCOUNTER — Inpatient Hospital Stay: Payer: Medicare PPO | Attending: Oncology

## 2021-07-28 DIAGNOSIS — E538 Deficiency of other specified B group vitamins: Secondary | ICD-10-CM | POA: Insufficient documentation

## 2021-07-28 DIAGNOSIS — Z8049 Family history of malignant neoplasm of other genital organs: Secondary | ICD-10-CM | POA: Insufficient documentation

## 2021-07-28 DIAGNOSIS — D696 Thrombocytopenia, unspecified: Secondary | ICD-10-CM | POA: Diagnosis not present

## 2021-07-28 DIAGNOSIS — Z87891 Personal history of nicotine dependence: Secondary | ICD-10-CM | POA: Diagnosis not present

## 2021-07-28 DIAGNOSIS — D631 Anemia in chronic kidney disease: Secondary | ICD-10-CM | POA: Insufficient documentation

## 2021-07-28 DIAGNOSIS — Z800 Family history of malignant neoplasm of digestive organs: Secondary | ICD-10-CM | POA: Diagnosis not present

## 2021-07-28 DIAGNOSIS — R5383 Other fatigue: Secondary | ICD-10-CM | POA: Insufficient documentation

## 2021-07-28 DIAGNOSIS — I129 Hypertensive chronic kidney disease with stage 1 through stage 4 chronic kidney disease, or unspecified chronic kidney disease: Secondary | ICD-10-CM | POA: Insufficient documentation

## 2021-07-28 DIAGNOSIS — N1832 Chronic kidney disease, stage 3b: Secondary | ICD-10-CM | POA: Insufficient documentation

## 2021-07-28 LAB — FERRITIN: Ferritin: 23 ng/mL (ref 11–307)

## 2021-07-28 LAB — CBC WITH DIFFERENTIAL/PLATELET
Abs Immature Granulocytes: 0.02 10*3/uL (ref 0.00–0.07)
Basophils Absolute: 0 10*3/uL (ref 0.0–0.1)
Basophils Relative: 0 %
Eosinophils Absolute: 0.1 10*3/uL (ref 0.0–0.5)
Eosinophils Relative: 1 %
HCT: 40.3 % (ref 36.0–46.0)
Hemoglobin: 13.2 g/dL (ref 12.0–15.0)
Immature Granulocytes: 0 %
Lymphocytes Relative: 40 %
Lymphs Abs: 2.8 10*3/uL (ref 0.7–4.0)
MCH: 31.7 pg (ref 26.0–34.0)
MCHC: 32.8 g/dL (ref 30.0–36.0)
MCV: 96.6 fL (ref 80.0–100.0)
Monocytes Absolute: 0.7 10*3/uL (ref 0.1–1.0)
Monocytes Relative: 10 %
Neutro Abs: 3.4 10*3/uL (ref 1.7–7.7)
Neutrophils Relative %: 49 %
Platelets: 134 10*3/uL — ABNORMAL LOW (ref 150–400)
RBC: 4.17 MIL/uL (ref 3.87–5.11)
RDW: 13.1 % (ref 11.5–15.5)
WBC: 7 10*3/uL (ref 4.0–10.5)
nRBC: 0 % (ref 0.0–0.2)

## 2021-07-28 LAB — IRON AND TIBC
Iron: 91 ug/dL (ref 28–170)
Saturation Ratios: 28 % (ref 10.4–31.8)
TIBC: 326 ug/dL (ref 250–450)
UIBC: 235 ug/dL

## 2021-07-28 LAB — VITAMIN B12: Vitamin B-12: 611 pg/mL (ref 180–914)

## 2021-07-31 ENCOUNTER — Encounter: Payer: Self-pay | Admitting: Oncology

## 2021-07-31 ENCOUNTER — Inpatient Hospital Stay (HOSPITAL_BASED_OUTPATIENT_CLINIC_OR_DEPARTMENT_OTHER): Payer: Medicare PPO | Admitting: Oncology

## 2021-07-31 ENCOUNTER — Inpatient Hospital Stay: Payer: Medicare PPO

## 2021-07-31 VITALS — BP 140/117 | HR 55 | Temp 97.8°F | Resp 20 | Wt 169.7 lb

## 2021-07-31 DIAGNOSIS — D631 Anemia in chronic kidney disease: Secondary | ICD-10-CM

## 2021-07-31 DIAGNOSIS — R5383 Other fatigue: Secondary | ICD-10-CM | POA: Diagnosis not present

## 2021-07-31 DIAGNOSIS — E538 Deficiency of other specified B group vitamins: Secondary | ICD-10-CM | POA: Diagnosis not present

## 2021-07-31 DIAGNOSIS — D696 Thrombocytopenia, unspecified: Secondary | ICD-10-CM

## 2021-07-31 DIAGNOSIS — Z800 Family history of malignant neoplasm of digestive organs: Secondary | ICD-10-CM | POA: Diagnosis not present

## 2021-07-31 DIAGNOSIS — I129 Hypertensive chronic kidney disease with stage 1 through stage 4 chronic kidney disease, or unspecified chronic kidney disease: Secondary | ICD-10-CM | POA: Diagnosis not present

## 2021-07-31 DIAGNOSIS — N1832 Chronic kidney disease, stage 3b: Secondary | ICD-10-CM | POA: Diagnosis not present

## 2021-07-31 DIAGNOSIS — Z8049 Family history of malignant neoplasm of other genital organs: Secondary | ICD-10-CM | POA: Diagnosis not present

## 2021-07-31 DIAGNOSIS — Z87891 Personal history of nicotine dependence: Secondary | ICD-10-CM | POA: Diagnosis not present

## 2021-07-31 NOTE — Progress Notes (Signed)
Hematology/Oncology note [facility] Telephone:([phone] [phone]   Patient Care Team: Steele Sizer, MD as PCP - General (Family Medicine) Earlie Server, MD as Consulting Physician (Oncology) Anthonette Legato, MD (Nephrology) Yolonda Kida, MD as Consulting Physician (Cardiology)  REFERRING PROVIDER: Dr.Lateef REASON FOR VISIT Follow up for management of abnormal UPEP  HISTORY OF PRESENTING ILLNESS:  Morgan Livingston is a  75 y.o.  female with PMH listed below who was referred to me for evaluation of abnormal UPEP.   She has CKD and follows up with Dr.Lateef. History of HTN. Reports doing well, denies bone pain.  Fatigue:  Chronic onset, perisistent, no aggravating or improving factors, no associated symptoms.  Reviewed labs which was done at [facility] office.  03/05/2018 SPEP done at [place] showed negative for M Spike UPEP: M spike 16.4  # colonoscopy done on 12 02/05/2016 with findings of polyps which were resected and retrieved.  Pathology showed negative for high-grade dysplasia or malignancy.  + Tubular adenoma   INTERVAL HISTORY Morgan Livingston is a 75 y.o. female who has above history reviewed by me today presents for follow up visit for management of thrombocytopenia and vitamin B12 deficiency,  Patient has no new complaints today.   Review of Systems  Constitutional:  Positive for malaise/fatigue. Negative for chills, fever and weight loss.  HENT:  Negative for sore throat.   Eyes:  Negative for redness.  Respiratory:  Negative for cough, shortness of breath and wheezing.   Cardiovascular:  Negative for chest pain, palpitations and leg swelling.  Gastrointestinal:  Negative for abdominal pain, blood in stool, heartburn, nausea and vomiting.  Genitourinary:  Negative for dysuria.  Musculoskeletal:  Negative for myalgias.  Skin:  Negative for rash.  Neurological:  Negative for dizziness, tingling and  tremors. Seizures: . Endo/Heme/Allergies:  Does not bruise/bleed easily.  Psychiatric/Behavioral:  Negative for hallucinations.    MEDICAL HISTORY:  Past Medical History:  Diagnosis Date   Allergy    Cancer ([place])    Uterine Cancer   Hyperlipidemia    Hypertension    Kidney stones 06/21/2018    SURGICAL HISTORY: Past Surgical History:  Procedure Laterality Date   ABDOMINAL HYSTERECTOMY     BREAST BIOPSY Right 04/09/2006   negative   BREAST BIOPSY Left 12/04/2017   fibroadenmatoid change with coarse calcs   COLONOSCOPY WITH PROPOFOL N/A 10/07/2018   Procedure: COLONOSCOPY WITH PROPOFOL;  Surgeon: [name], MD;  Location: [facility] ENDOSCOPY;  Service: Gastroenterology;  Laterality: N/A;   LIPOMA EXCISION Left 04/15/2020   Procedure: EXCISION LIPOMA, left arm;  Surgeon: Fredirick Maudlin, MD;  Location: [facility] ORS;  Service: General;  Laterality: Left;    SOCIAL HISTORY: Social History   Socioeconomic History   Marital status: Married    Spouse name: Junior   Number of children: 2   Years of education: some college   Highest education level: 12th grade  Occupational History   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   Tobacco Use   Smoking status: Former    Packs/day: 1.00    Years: 2.00    Pack years: 2.00    Types: Cigarettes    Quit date: 1963    Years since quitting: 59.7   Smokeless tobacco: Never   Tobacco comments:    smoking cessation materials not required  Vaping Use   Vaping Use: Never used  Substance and Sexual Activity   Alcohol use: No    Alcohol/week:  0.0 standard drinks   Drug use: No   Sexual activity: Yes    Partners: Male    Birth control/protection: Post-menopausal  Other Topics Concern   Not on file  Social History Narrative   Married for 50 years    Social Determinants of Health   Financial Resource Strain: Low Risk    Difficulty of Paying Living Expenses: Not hard at all  Food Insecurity: No Food Insecurity   Worried About  Charity fundraiser in the Last Year: Never true   Arboriculturist in the Last Year: Never true  Transportation Needs: No Transportation Needs   Lack of Transportation (Medical): No   Lack of Transportation (Non-Medical): No  Physical Activity: Insufficiently Active   Days of Exercise per Week: 7 days   Minutes of Exercise per Session: 20 min  Stress: No Stress Concern Present   Feeling of Stress : Not at all  Social Connections: Socially Integrated   Frequency of Communication with Friends and Family: More than three times a week   Frequency of Social Gatherings with Friends and Family: More than three times a week   Attends Religious Services: More than 4 times per year   Active Member of [employer] or Organizations: Yes   Attends Music therapist: More than 4 times per year   Marital Status: Married  Human resources officer Violence: Not At Risk   Fear of Current or Ex-Partner: No   Emotionally Abused: No   Physically Abused: No   Sexually Abused: No    FAMILY HISTORY: Family History  Problem Relation Age of Onset   Congestive Heart Failure Mother 36   Heart attack Father 63   Sinusitis Sister    Stomach cancer Sister    Uterine cancer Sister 92   Liver cancer Sister    Pancreatic cancer Sister    Dementia Sister        44   Breast cancer Neg Hx     ALLERGIES:  has No Known Allergies.  MEDICATIONS:  Current Outpatient Medications  Medication Sig Dispense Refill   acetaminophen (TYLENOL) 500 MG tablet Take 2 tablets (1,000 mg total) by mouth every 6 (six) hours as needed for mild pain or headache. 30 tablet 0   aspirin 81 MG tablet Take 81 mg by mouth daily.     Biotin 5 MG CAPS Take by mouth daily.     carboxymethylcellulose (REFRESH PLUS) 0.5 % SOLN Place 1 drop into both eyes daily.     cholecalciferol (VITAMIN D) 1000 UNITS tablet Take 1,000 Units by mouth daily.      fexofenadine (ALLEGRA) 180 MG tablet Take 180 mg by mouth daily.      losartan-hydrochlorothiazide (HYZAAR) 50-12.5 MG tablet Take 1 tablet by mouth daily. 90 tablet 1   rosuvastatin (CRESTOR) 20 MG tablet Take 1 tablet (20 mg total) by mouth at bedtime. 90 tablet 1   vitamin B-12 (CYANOCOBALAMIN) 1000 MCG tablet Take 1 tablet (1,000 mcg total) by mouth daily. (Patient taking differently: Take 1,000 mcg by mouth once a week. Wednesday) 90 tablet 1   vitamin E 1000 UNIT capsule Take 1,000 Units by mouth 3 (three) times a week.     No current facility-administered medications for this visit.     PHYSICAL EXAMINATION: ECOG PERFORMANCE STATUS: 1 - Symptomatic but completely ambulatory Vitals:   07/31/21 0957  BP: (!) 140/117  Pulse: (!) 55  Resp: 20  Temp: 97.8 F (36.6 C)  SpO2: 100%  Filed Weights   07/31/21 0957  Weight: 169 lb 11.2 oz (77 kg)    Physical Exam Constitutional:      General: She is not in acute distress. HENT:     Head: Normocephalic and atraumatic.  Eyes:     General: No scleral icterus.    Conjunctiva/sclera: Conjunctivae normal.     Pupils: Pupils are equal, round, and reactive to light.  Cardiovascular:     Rate and Rhythm: Normal rate and regular rhythm.     Heart sounds: Normal heart sounds.  Pulmonary:     Effort: Pulmonary effort is normal. No respiratory distress.     Breath sounds: Normal breath sounds. No wheezing or rales.  Chest:     Chest wall: No tenderness.  Abdominal:     General: Bowel sounds are normal. There is no distension.     Palpations: Abdomen is soft. There is no mass.     Tenderness: There is no abdominal tenderness.  Musculoskeletal:        General: No deformity. Normal range of motion.     Cervical back: Normal range of motion and neck supple.  Lymphadenopathy:     Cervical: No cervical adenopathy.  Skin:    General: Skin is warm and dry.     Findings: No erythema or rash.  Neurological:     Mental Status: She is alert and oriented to person, place, and time.     Cranial Nerves: No  cranial nerve deficit.     Coordination: Coordination normal.  Psychiatric:        Behavior: Behavior normal.        Thought Content: Thought content normal.     LABORATORY DATA:  I have reviewed the data as listed Lab Results  Component Value Date   WBC 7.0 07/28/2021   HGB 13.2 07/28/2021   HCT 40.3 07/28/2021   MCV 96.6 07/28/2021   PLT 134 (L) 07/28/2021   Recent Labs    01/24/21 1105  NA 138  K 4.1  CL 103  CO2 28  GLUCOSE 94  BUN 17  CREATININE 1.56*  CALCIUM 9.5  GFRNONAA 35*  PROT 7.6  ALBUMIN 3.9  AST 23  ALT 15  ALKPHOS 51  BILITOT 0.7    Iron/TIBC/Ferritin/ %Sat    Component Value Date/Time   IRON 91 07/28/2021 1124   IRON 76 05/23/2015 0914   TIBC 326 07/28/2021 1124   TIBC 276 05/23/2015 0914   FERRITIN 23 07/28/2021 1124   FERRITIN 57 05/23/2015 0914   IRONPCTSAT 28 07/28/2021 1124   IRONPCTSAT 28 05/23/2015 0914    04/17/2018  SPEP no M spike. Normal serum free light chain ratio. UPEP, no M spike, elevated free kappa/lamda ratio at 20.75.   08/12/2018 UPEP no M spike  ASSESSMENT & PLAN:  1. Thrombocytopenia ([place])   2. B12 deficiency   3. Anemia in stage 3b chronic kidney disease (HCC)     History of vitamin B12 deficiency  Vitamin B12 has improved to 611. Recommend patient to take vitamin B12 every other day for maintenance. Antiparietal and intrinsic factor antibodies are both negative.   Chronic thrombocytopenia, platelet counts are stable.  Patient has had previous work-up done including negative hepatitis, HIV.  Platelet count has been stable today at 126.  Anemia in stage III CKD, iron panel has improved.  Hemoglobin has completely normalized to 13.2 today Continue Vitron C 1 tablet .  Marland Kitchen  Follow-up lab MD in 12 months-CBC, iron TIBC ferritin,  B12. Orders Placed This Encounter  Procedures   CBC with Differential/Platelet    Standing Status:   Future    Standing Expiration Date:   07/31/2022   Ferritin    Standing Status:    Future    Standing Expiration Date:   07/31/2022   Iron and TIBC    Standing Status:   Future    Standing Expiration Date:   07/31/2022   Vitamin B12    Standing Status:   Future    Standing Expiration Date:   07/31/2022    All questions were answered. The patient knows to call the clinic with any problems questions or concerns.   Earlie Server, MD, PhD 07/31/2021

## 2021-08-07 DIAGNOSIS — H40003 Preglaucoma, unspecified, bilateral: Secondary | ICD-10-CM | POA: Diagnosis not present

## 2021-08-09 ENCOUNTER — Ambulatory Visit (INDEPENDENT_AMBULATORY_CARE_PROVIDER_SITE_OTHER): Payer: Medicare PPO

## 2021-08-09 ENCOUNTER — Other Ambulatory Visit: Payer: Self-pay

## 2021-08-09 DIAGNOSIS — Z23 Encounter for immunization: Secondary | ICD-10-CM | POA: Diagnosis not present

## 2021-08-14 DIAGNOSIS — H40003 Preglaucoma, unspecified, bilateral: Secondary | ICD-10-CM | POA: Diagnosis not present

## 2021-08-24 ENCOUNTER — Encounter: Payer: Self-pay | Admitting: General Surgery

## 2021-08-31 ENCOUNTER — Encounter: Payer: Self-pay | Admitting: Oncology

## 2021-09-11 LAB — FECAL OCCULT BLOOD, IMMUNOCHEMICAL: IFOBT: NEGATIVE

## 2021-10-09 DIAGNOSIS — R809 Proteinuria, unspecified: Secondary | ICD-10-CM | POA: Diagnosis not present

## 2021-10-09 DIAGNOSIS — I10 Essential (primary) hypertension: Secondary | ICD-10-CM | POA: Diagnosis not present

## 2021-10-09 DIAGNOSIS — N1832 Chronic kidney disease, stage 3b: Secondary | ICD-10-CM | POA: Diagnosis not present

## 2021-10-09 DIAGNOSIS — N2581 Secondary hyperparathyroidism of renal origin: Secondary | ICD-10-CM | POA: Diagnosis not present

## 2021-10-17 NOTE — Progress Notes (Signed)
Name: Morgan Livingston   MRN: [id number]    DOB: [birth year]   Date:10/18/2021       Progress Note  Subjective  Chief Complaint  Follow Up  HPI  HTN: she is back on medication losartan/hctz but down to 50/12.5 , bp is at goal today, no chest pain , palpitation or sob . Dizziness resolved   B12 deficiency /Thrombocytopenia/MUGS  : under the care of [name], last B12 was low again. Under the care of hematologist, reviewed last labs    Atherosclerosis aorta/dyslipidemia: found on X-ray of lumbar spine, she does not have claudication. She will continue statin and aspirin daily , last LDL was above 144, explained that goal is below 100, we will add zetia    Proteinuria and CKI stage III: seeing [name], she had secondary hyperparathyroidism,and vitamin D deficiency. She denies cramping, pruritis or decrease in urine output  .Last GFR was stable CKI stage III - last GFR went up from 35 to 44 ( last checked Dec by nephrologist) She is on ARB   Positive for Sjogren's Anti SS-A and also ANA: on labs done 03/2018 by [name] , 03/2018 showed negative for M Spike UPEP spike and monitored by  [name]. She denies dry mouth , but states she has a long history of dry eyes and not changed - uses eye drops. No arthralgias.    Hyperglycemia: no family history of diabetes, A1C was as high at 6.4 % but last visit it was 6 %  , trying to eat healthy and avoiding sweets    Chronic back pain: doing well now    Patient Active Problem List   Diagnosis Date Noted   History of IBS 06/03/2020   Mass of arm, left    MGUS (monoclonal gammopathy of unknown significance) 09/01/2018   Secondary hyperparathyroidism ([place]) 09/01/2018   B12 deficiency 08/19/2018   Atherosclerosis of aorta ([place]) 07/25/2018   Thrombocytopenia ([place]) 05/09/2018   Proteinuria 05/09/2018   Positive ANA (antinuclear antibody) 04/02/2018   Bilateral dry eyes 04/02/2018   History of hysterectomy 05/23/2015   Chronic kidney disease, stage  III (moderate) ([place]) 05/23/2015   Benign hypertension 05/22/2015   Chronic constipation 05/22/2015   DDD (degenerative disc disease), lumbar 05/22/2015   DD (diverticular disease) 05/22/2015   Dyslipidemia 05/22/2015   History of cervical cancer 16/96/7893   Helicobacter pylori gastrointestinal tract infection 05/22/2015   Blood glucose elevated 05/22/2015   Overweight (BMI 25.0-29.9) 05/22/2015   Perennial allergic rhinitis with seasonal variation 05/22/2015   Vitamin D deficiency 05/22/2015   Impingement syndrome of shoulder 01/14/2015    Past Surgical History:  Procedure Laterality Date   ABDOMINAL HYSTERECTOMY     BREAST BIOPSY Right 04/09/2006   negative   BREAST BIOPSY Left 12/04/2017   fibroadenmatoid change with coarse calcs   COLONOSCOPY WITH PROPOFOL N/A 10/07/2018   Procedure: COLONOSCOPY WITH PROPOFOL;  Surgeon: [name], MD;  Location[facility] ENDOSCOPY;  Service: Gastroenterology;  Laterality: N/A;   LIPOMA EXCISION Left 04/15/2020   Procedure: EXCISION LIPOMA, left arm;  Surgeon: Fredirick Maudlin, MD;  Location: [facility] ORS;  Service: General;  Laterality: Left;    Family History  Problem Relation Age of Onset   Congestive Heart Failure Mother 49   Heart attack Father 70   Sinusitis Sister    Stomach cancer Sister    Uterine cancer Sister 48   Liver cancer Sister    Pancreatic cancer Sister    Dementia Sister  26   Breast cancer Neg Hx     Social History   Tobacco Use   Smoking status: Former    Packs/day: 1.00    Years: 2.00    Pack years: 2.00    Types: Cigarettes    Quit date: 1963    Years since quitting: 59.9   Smokeless tobacco: Never   Tobacco comments:    smoking cessation materials not required  Substance Use Topics   Alcohol use: No    Alcohol/week: 0.0 standard drinks     Current Outpatient Medications:    acetaminophen (TYLENOL) 500 MG tablet, Take 2 tablets (1,000 mg total) by mouth every 6 (six) hours as needed for mild pain  or headache., Disp: 30 tablet, Rfl: 0   aspirin 81 MG tablet, Take 81 mg by mouth daily., Disp: , Rfl:    Biotin 5 MG CAPS, Take by mouth daily., Disp: , Rfl:    carboxymethylcellulose (REFRESH PLUS) 0.5 % SOLN, Place 1 drop into both eyes daily., Disp: , Rfl:    cholecalciferol (VITAMIN D) 1000 UNITS tablet, Take 1,000 Units by mouth daily. , Disp: , Rfl:    ferrous sulfate 324 MG TBEC, Take 65 mg by mouth daily with breakfast., Disp: , Rfl:    fexofenadine (ALLEGRA) 180 MG tablet, Take 180 mg by mouth daily., Disp: , Rfl:    losartan-hydrochlorothiazide (HYZAAR) 50-12.5 MG tablet, Take 1 tablet by mouth daily., Disp: 90 tablet, Rfl: 1   rosuvastatin (CRESTOR) 20 MG tablet, Take 1 tablet (20 mg total) by mouth at bedtime., Disp: 90 tablet, Rfl: 1   vitamin B-12 (CYANOCOBALAMIN) 1000 MCG tablet, Take 1 tablet (1,000 mcg total) by mouth daily. (Patient taking differently: Take 1,000 mcg by mouth once a week. Wednesday), Disp: 90 tablet, Rfl: 1   vitamin E 1000 UNIT capsule, Take 1,000 Units by mouth 3 (three) times a week., Disp: , Rfl:   No Known Allergies  I personally reviewed active problem list, medication list, allergies, family history, social history, health maintenance with the patient/caregiver today.   ROS  Constitutional: Negative for fever or weight change.  Respiratory: Negative for cough and shortness of breath.   Cardiovascular: Negative for chest pain or palpitations.  Gastrointestinal: Negative for abdominal pain, no bowel changes.  Musculoskeletal: Negative for gait problem or joint swelling.  Skin: Negative for rash.  Neurological: Negative for dizziness or headache.  No other specific complaints in a complete review of systems (except as listed in HPI above).   Objective  Vitals:   10/18/21 0832  BP: 126/78  Pulse: 72  Resp: 16  Temp: 98.1 F (36.7 C)  TempSrc: Oral  SpO2: 99%  Weight: 169 lb 4.8 oz (76.8 kg)  Height: 5\' 5"  (1.651 m)    Body mass index  is 28.17 kg/m.  Physical Exam  Constitutional: Patient appears well-developed and well-nourished. Overweight.  No distress.  HEENT: head atraumatic, normocephalic, pupils equal and reactive to light, neck supple Cardiovascular: Normal rate, regular rhythm and normal heart sounds.  No murmur heard. No BLE edema. Pulmonary/Chest: Effort normal and breath sounds normal. No respiratory distress. Abdominal: Soft.  There is no tenderness. Psychiatric: Patient has a normal mood and affect. behavior is normal. Judgment and thought content normal.   Recent Results (from the past 2160 hour(s))  Vitamin B12     Status: None   Collection Time: 07/28/21 11:24 AM  Result Value Ref Range   Vitamin B-12 611 180 - 914 pg/mL  Comment: (NOTE) This assay is not validated for testing neonatal or myeloproliferative syndrome specimens for Vitamin B12 levels. Performed at [facility], [place] [address] [place], [place] [address]   Ferritin     Status: None   Collection Time: 07/28/21 11:24 AM  Result Value Ref Range   Ferritin 23 11 - 307 ng/mL    Comment: Performed at [facility], [address] [place], [place] [address]  Iron and TIBC     Status: None   Collection Time: 07/28/21 11:24 AM  Result Value Ref Range   Iron 91 28 - 170 ug/dL   TIBC 326 250 - 450 ug/dL   Saturation Ratios 28 10.4 - 31.8 %   UIBC 235 ug/dL    Comment: Performed at [facility], [address] [place], [place] [address]  CBC with Differential/Platelet     Status: Abnormal   Collection Time: 07/28/21 11:24 AM  Result Value Ref Range   WBC 7.0 4.0 - 10.5 K/uL   RBC 4.17 3.87 - 5.11 MIL/uL   Hemoglobin 13.2 12.0 - 15.0 g/dL   HCT 40.3 36.0 - 46.0 %   MCV 96.6 80.0 - 100.0 fL   MCH 31.7 26.0 - 34.0 pg   MCHC 32.8 30.0 - 36.0 g/dL   RDW 13.1 11.5 - 15.5 %   Platelets 134 (L) 150 - 400 K/uL   nRBC 0.0 0.0 - 0.2 %   Neutrophils Relative % 49 %   Neutro Abs 3.4 1.7 - 7.7 K/uL   Lymphocytes  Relative 40 %   Lymphs Abs 2.8 0.7 - 4.0 K/uL   Monocytes Relative 10 %   Monocytes Absolute 0.7 0.1 - 1.0 K/uL   Eosinophils Relative 1 %   Eosinophils Absolute 0.1 0.0 - 0.5 K/uL   Basophils Relative 0 %   Basophils Absolute 0.0 0.0 - 0.1 K/uL   Immature Granulocytes 0 %   Abs Immature Granulocytes 0.02 0.00 - 0.07 K/uL    Comment: Performed at [facility], [address] [place], [place] [address]  Fecal occult blood, imunochemical     Status: None   Collection Time: 09/05/21 12:00 AM  Result Value Ref Range   IFOBT Negative     Comment: [facility]    PHQ2/9: Depression screen [facility] 2/9 10/18/2021 04/18/2021 12/29/2020 10/11/2020 07/14/2020  Decreased Interest 0 0 0 0 0  Down, Depressed, Hopeless 0 0 0 0 0  PHQ - 2 Score 0 0 0 0 0  Altered sleeping - - - 0 -  Tired, decreased energy - - - 0 -  Change in appetite - - - 0 -  Feeling bad or failure about yourself  - - - 0 -  Trouble concentrating - - - 0 -  Moving slowly or fidgety/restless - - - 0 -  Suicidal thoughts - - - 0 -  PHQ-9 Score - - - 0 -  Difficult doing work/chores - - - - -  Some recent data might be hidden    phq 9 is negative   Fall Risk: Fall Risk  10/18/2021 04/18/2021 12/29/2020 10/11/2020 07/14/2020  Falls in the past year? 0 0 0 0 0  Number falls in past yr: - 0 0 0 0  Injury with Fall? - 0 0 0 0  Risk for fall due to : - - No Fall Risks - -  Follow up Falls prevention discussed - Falls prevention discussed - -  Functional Status Survey: Is the patient deaf or have difficulty hearing?: No Does the patient have difficulty seeing, even when wearing glasses/contacts?: No Does the patient have difficulty concentrating, remembering, or making decisions?: No Does the patient have difficulty walking or climbing stairs?: No Does the patient have difficulty dressing or bathing?: No Does the patient have difficulty doing errands alone such as visiting a doctor's office or shopping?:  No    Assessment & Plan  1. Atherosclerosis of aorta (HCC)  - rosuvastatin (CRESTOR) 20 MG tablet; Take 1 tablet (20 mg total) by mouth at bedtime.  Dispense: 90 tablet; Refill: 1 - ezetimibe (ZETIA) 10 MG tablet; Take 1 tablet (10 mg total) by mouth daily.  Dispense: 90 tablet; Refill: 1  2. Thrombocytopathia ([place])  Stablbe  3. Secondary hyperparathyroidism ([place])  Under the care of nephrologist   4. Stage 3b chronic kidney disease (HCC)  Stable   5. Colon cancer screening  - Ambulatory referral to Gastroenterology  6. B12 deficiency  Monitored by hematologist   7. Essential hypertension  - losartan-hydrochlorothiazide (HYZAAR) 50-12.5 MG tablet; Take 1 tablet by mouth daily.  Dispense: 90 tablet; Refill: 1  8. MGUS (monoclonal gammopathy of unknown significance)   9. Vitamin D deficiency   10. Dyslipidemia  - rosuvastatin (CRESTOR) 20 MG tablet; Take 1 tablet (20 mg total) by mouth at bedtime.  Dispense: 90 tablet; Refill: 1 - losartan-hydrochlorothiazide (HYZAAR) 50-12.5 MG tablet; Take 1 tablet by mouth daily.  Dispense: 90 tablet; Refill: 1  11. Hyperglycemia   12. Need for shingles vaccine  - Zoster Vaccine Adjuvanted [facility]) injection; Inject 0.5 mLs into the muscle once for 1 dose.  Dispense: 0.5 mL; Refill: 1   13. Encounter for screening mammogram for malignant neoplasm of breast  - MM 3D SCREEN BREAST BILATERAL; Future

## 2021-10-18 ENCOUNTER — Other Ambulatory Visit: Payer: Self-pay

## 2021-10-18 ENCOUNTER — Encounter: Payer: Self-pay | Admitting: Family Medicine

## 2021-10-18 ENCOUNTER — Ambulatory Visit: Payer: Medicare PPO | Admitting: Family Medicine

## 2021-10-18 VITALS — BP 126/78 | HR 72 | Temp 98.1°F | Resp 16 | Ht 65.0 in | Wt 169.3 lb

## 2021-10-18 DIAGNOSIS — Z1211 Encounter for screening for malignant neoplasm of colon: Secondary | ICD-10-CM | POA: Diagnosis not present

## 2021-10-18 DIAGNOSIS — N2581 Secondary hyperparathyroidism of renal origin: Secondary | ICD-10-CM

## 2021-10-18 DIAGNOSIS — Z23 Encounter for immunization: Secondary | ICD-10-CM

## 2021-10-18 DIAGNOSIS — I700 Atherosclerosis of aorta: Secondary | ICD-10-CM

## 2021-10-18 DIAGNOSIS — E559 Vitamin D deficiency, unspecified: Secondary | ICD-10-CM | POA: Diagnosis not present

## 2021-10-18 DIAGNOSIS — E785 Hyperlipidemia, unspecified: Secondary | ICD-10-CM

## 2021-10-18 DIAGNOSIS — E538 Deficiency of other specified B group vitamins: Secondary | ICD-10-CM

## 2021-10-18 DIAGNOSIS — D691 Qualitative platelet defects: Secondary | ICD-10-CM | POA: Diagnosis not present

## 2021-10-18 DIAGNOSIS — Z1231 Encounter for screening mammogram for malignant neoplasm of breast: Secondary | ICD-10-CM

## 2021-10-18 DIAGNOSIS — D472 Monoclonal gammopathy: Secondary | ICD-10-CM | POA: Diagnosis not present

## 2021-10-18 DIAGNOSIS — N1832 Chronic kidney disease, stage 3b: Secondary | ICD-10-CM

## 2021-10-18 DIAGNOSIS — I10 Essential (primary) hypertension: Secondary | ICD-10-CM

## 2021-10-18 DIAGNOSIS — R739 Hyperglycemia, unspecified: Secondary | ICD-10-CM

## 2021-10-18 MED ORDER — ROSUVASTATIN CALCIUM 20 MG PO TABS: 20.0000 mg | ORAL_TABLET | Freq: Every day | ORAL | 1 refills | Status: DC

## 2021-10-18 MED ORDER — LOSARTAN POTASSIUM-HCTZ 50-12.5 MG PO TABS: 1.0000 | ORAL_TABLET | Freq: Every day | ORAL | 1 refills | Status: DC

## 2021-10-18 MED ORDER — SHINGRIX 50 MCG/0.5ML IM SUSR: 0.5000 mL | Freq: Once | INTRAMUSCULAR | 1 refills | Status: AC

## 2021-10-18 MED ORDER — EZETIMIBE 10 MG PO TABS: 10.0000 mg | ORAL_TABLET | Freq: Every day | ORAL | 1 refills | Status: DC

## 2021-10-31 ENCOUNTER — Telehealth: Payer: Self-pay

## 2021-10-31 ENCOUNTER — Other Ambulatory Visit: Payer: Self-pay

## 2021-10-31 DIAGNOSIS — Z86010 Personal history of colonic polyps: Secondary | ICD-10-CM

## 2021-10-31 MED ORDER — PEG 3350-KCL-NA BICARB-NACL 420 G PO SOLR: 4000.0000 mL | Freq: Once | ORAL | 0 refills | Status: AC

## 2021-10-31 NOTE — Telephone Encounter (Signed)
CALLED PATIENT NO ANSWER LEFT VOICEMAIL FOR A CALL BACK ? ?

## 2021-10-31 NOTE — Progress Notes (Signed)
Gastroenterology Pre-Procedure Review  Request Date: 11/21/2021 Requesting Physician: Dr. Vicente Males  PATIENT REVIEW QUESTIONS: The patient responded to the following health history questions as indicated:    1. Are you having any GI issues? no 2. Do you have a personal history of Polyps? yes (last colonoscopy) 3. Do you have a family history of Colon Cancer or Polyps? no 4. Diabetes Mellitus? no 5. Joint replacements in the past 12 months?no 6. Major health problems in the past 3 months?no 7. Any artificial heart valves, MVP, or defibrillator?no    MEDICATIONS & ALLERGIES:    Patient reports the following regarding taking any anticoagulation/antiplatelet therapy:   Plavix, Coumadin, Eliquis, Xarelto, Lovenox, Pradaxa, Brilinta, or Effient? no Aspirin? yes (81mg )  Patient confirms/reports the following medications:  Current Outpatient Medications  Medication Sig Dispense Refill   acetaminophen (TYLENOL) 500 MG tablet Take 2 tablets (1,000 mg total) by mouth every 6 (six) hours as needed for mild pain or headache. 30 tablet 0   aspirin 81 MG tablet Take 81 mg by mouth daily.     Biotin 5 MG CAPS Take by mouth daily.     carboxymethylcellulose (REFRESH PLUS) 0.5 % SOLN Place 1 drop into both eyes daily.     cholecalciferol (VITAMIN D) 1000 UNITS tablet Take 1,000 Units by mouth daily.      ezetimibe (ZETIA) 10 MG tablet Take 1 tablet (10 mg total) by mouth daily. 90 tablet 1   ferrous sulfate 324 MG TBEC Take 65 mg by mouth daily with breakfast.     fexofenadine (ALLEGRA) 180 MG tablet Take 180 mg by mouth daily.     losartan-hydrochlorothiazide (HYZAAR) 50-12.5 MG tablet Take 1 tablet by mouth daily. 90 tablet 1   rosuvastatin (CRESTOR) 20 MG tablet Take 1 tablet (20 mg total) by mouth at bedtime. 90 tablet 1   vitamin B-12 (CYANOCOBALAMIN) 1000 MCG tablet Take 1 tablet (1,000 mcg total) by mouth daily. (Patient taking differently: Take 1,000 mcg by mouth once a week. Wednesday) 90 tablet 1    vitamin E 1000 UNIT capsule Take 1,000 Units by mouth 3 (three) times a week.     No current facility-administered medications for this visit.    Patient confirms/reports the following allergies:  No Known Allergies  No orders of the defined types were placed in this encounter.   AUTHORIZATION INFORMATION Primary Insurance: 1D#: Group #:  Secondary Insurance: 1D#: Group #:  SCHEDULE INFORMATION: Date: 11/21/2021 Time: Location: [facility]

## 2021-11-21 ENCOUNTER — Ambulatory Visit: Payer: Medicare PPO | Admitting: Anesthesiology

## 2021-11-21 ENCOUNTER — Encounter: Payer: Self-pay | Admitting: Gastroenterology

## 2021-11-21 ENCOUNTER — Ambulatory Visit
Admission: RE | Admit: 2021-11-21 | Discharge: 2021-11-21 | Disposition: A | Discharge status: Home / Self Care | Payer: Medicare PPO | Source: Ambulatory Visit | Attending: Gastroenterology | Admitting: Gastroenterology

## 2021-11-21 ENCOUNTER — Encounter
Admission: RE | Disposition: A | Discharge status: Home / Self Care | Payer: Self-pay | Source: Ambulatory Visit | Attending: Gastroenterology

## 2021-11-21 DIAGNOSIS — C55 Malignant neoplasm of uterus, part unspecified: Secondary | ICD-10-CM | POA: Diagnosis not present

## 2021-11-21 DIAGNOSIS — Z86010 Personal history of colonic polyps: Secondary | ICD-10-CM | POA: Insufficient documentation

## 2021-11-21 DIAGNOSIS — Z87442 Personal history of urinary calculi: Secondary | ICD-10-CM | POA: Insufficient documentation

## 2021-11-21 DIAGNOSIS — Z1211 Encounter for screening for malignant neoplasm of colon: Secondary | ICD-10-CM | POA: Insufficient documentation

## 2021-11-21 DIAGNOSIS — Z87891 Personal history of nicotine dependence: Secondary | ICD-10-CM | POA: Diagnosis not present

## 2021-11-21 DIAGNOSIS — E785 Hyperlipidemia, unspecified: Secondary | ICD-10-CM | POA: Insufficient documentation

## 2021-11-21 DIAGNOSIS — K5730 Diverticulosis of large intestine without perforation or abscess without bleeding: Secondary | ICD-10-CM | POA: Diagnosis not present

## 2021-11-21 DIAGNOSIS — I10 Essential (primary) hypertension: Secondary | ICD-10-CM | POA: Insufficient documentation

## 2021-11-21 HISTORY — PX: COLONOSCOPY WITH PROPOFOL: SHX5780

## 2021-11-21 SURGERY — COLONOSCOPY WITH PROPOFOL
Anesthesia: General

## 2021-11-21 MED ORDER — PROPOFOL 10 MG/ML IV BOLUS
INTRAVENOUS | Status: DC | PRN
Start: 2021-11-21 — End: 2021-11-21
  Administered 2021-11-21: 80 mg via INTRAVENOUS

## 2021-11-21 MED ORDER — LIDOCAINE 2% (20 MG/ML) 5 ML SYRINGE
INTRAMUSCULAR | Status: DC | PRN
  Administered 2021-11-21: 50 mg via INTRAVENOUS

## 2021-11-21 MED ORDER — PROPOFOL 500 MG/50ML IV EMUL
INTRAVENOUS | Status: DC | PRN
  Administered 2021-11-21: 200 ug/kg/min via INTRAVENOUS

## 2021-11-21 MED ORDER — PROPOFOL 500 MG/50ML IV EMUL
INTRAVENOUS | Status: AC
  Filled 2021-11-21: qty 50

## 2021-11-21 MED ORDER — SODIUM CHLORIDE 0.9 % IV SOLN: INTRAVENOUS | Status: DC

## 2021-11-21 NOTE — Transfer of Care (Signed)
Immediate Anesthesia Transfer of Care Note  Patient: Morgan Livingston  Procedure(s) Performed: COLONOSCOPY WITH PROPOFOL  Patient Location: Endoscopy Unit  Anesthesia Type:General  Level of Consciousness: sedated  Airway & Oxygen Therapy: Patient Spontanous Breathing  Post-op Assessment: Report given to RN and Post -op Vital signs reviewed and stable  Post vital signs: Reviewed and stable  Last Vitals:  Vitals Value Taken Time  BP 112/75 11/21/21 1047  Temp 36.4 C 11/21/21 1047  Pulse 62 11/21/21 1047  Resp 20 11/21/21 1047  SpO2 99 % 11/21/21 1047    Last Pain:  Vitals:   11/21/21 1047  TempSrc: Temporal  PainSc: Asleep         Complications: No notable events documented.

## 2021-11-21 NOTE — H&P (Signed)
[name], MD [address] [place], [place], [place], [address] [address], [place], [place], [place], [address] Phone: [phone]  Fax: [phone]  Primary Care Physician:  Steele Sizer, MD   Pre-Procedure History & Physical: HPI:  Morgan Livingston is a 76 y.o. female is here for an colonoscopy.   Past Medical History:  Diagnosis Date   Allergy    Cancer [facility] [place])    Uterine Cancer   Hyperlipidemia    Hypertension    Kidney stones 06/21/2018    Past Surgical History:  Procedure Laterality Date   ABDOMINAL HYSTERECTOMY     BREAST BIOPSY Right 04/09/2006   negative   BREAST BIOPSY Left 12/04/2017   fibroadenmatoid change with coarse calcs   COLONOSCOPY WITH PROPOFOL N/A 10/07/2018   Procedure: COLONOSCOPY WITH PROPOFOL;  Surgeon: [name], MD;  Location: [facility] ENDOSCOPY;  Service: Gastroenterology;  Laterality: N/A;   LIPOMA EXCISION Left 04/15/2020   Procedure: EXCISION LIPOMA, left arm;  Surgeon: Fredirick Maudlin, MD;  Location: [facility] ORS;  Service: General;  Laterality: Left;    Prior to Admission medications   Medication Sig Start Date End Date Taking? Authorizing Provider  Biotin 5 MG CAPS Take by mouth daily.   Yes [provider]  cholecalciferol (VITAMIN D) 1000 UNITS tablet Take 1,000 Units by mouth daily.    Yes [provider]  ferrous sulfate 324 MG TBEC Take 65 mg by mouth daily with breakfast.   Yes [provider]  losartan-hydrochlorothiazide (HYZAAR) 50-12.5 MG tablet Take 1 tablet by mouth daily. 10/18/21  Yes Sowles, Drue Stager, MD  rosuvastatin (CRESTOR) 20 MG tablet Take 1 tablet (20 mg total) by mouth at bedtime. 10/18/21  Yes Sowles, Drue Stager, MD  vitamin B-12 (CYANOCOBALAMIN) 1000 MCG tablet Take 1 tablet (1,000 mcg total) by mouth daily. Patient taking differently: Take 1,000 mcg by mouth once a week. Wednesday 03/23/19  Yes Earlie Server, MD  vitamin E 1000 UNIT capsule Take 1,000 Units by mouth 3 (three) times a week.   Yes  [provider]  acetaminophen (TYLENOL) 500 MG tablet Take 2 tablets (1,000 mg total) by mouth every 6 (six) hours as needed for mild pain or headache. 04/15/20   Fredirick Maudlin, MD  aspirin 81 MG tablet Take 81 mg by mouth daily.    [provider]  carboxymethylcellulose (REFRESH PLUS) 0.5 % SOLN Place 1 drop into both eyes daily.    [provider]  ezetimibe (ZETIA) 10 MG tablet Take 1 tablet (10 mg total) by mouth daily. 10/18/21   Steele Sizer, MD  fexofenadine (ALLEGRA) 180 MG tablet Take 180 mg by mouth daily.    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   Tobacco  Use   Smoking status: Former    Packs/day: 1.00    Years: 2.00    Pack years: 2.00    Types: Cigarettes    Quit date: 1963    Years since quitting: 60.0   Smokeless tobacco: Never   Tobacco comments:    smoking cessation materials not required  Vaping Use   Vaping Use: Never used  Substance and Sexual Activity   Alcohol use: No    Alcohol/week: 0.0 standard drinks   Drug use: No   Sexual activity: Yes    Partners: Male    Birth control/protection: Post-menopausal  Other Topics Concern   Not on file  Social History Narrative   Married for 50 years    Social Determinants of Health   Financial Resource Strain: Low Risk    Difficulty of Paying Living Expenses: Not hard at all   Food Insecurity: No Food Insecurity   Worried About Charity fundraiser in the Last Year: Never true   Arboriculturist in the Last Year: Never true  Transportation Needs: No Transportation Needs   Lack of Transportation (Medical): No   Lack of Transportation (Non-Medical): No  Physical Activity: Insufficiently Active   Days of Exercise per Week: 7 days   Minutes of Exercise per Session: 20 min  Stress: No Stress Concern Present   Feeling of Stress : Not at all  Social Connections: Socially Integrated   Frequency of Communication with Friends and Family: More than three times a week   Frequency of Social Gatherings with Friends and Family: More than three times a week   Attends Religious Services: More than 4 times per year   Active Member of [employer] or Organizations: Yes   Attends Music therapist: More than 4 times per year   Marital Status: Married  Human resources officer Violence: Not At Risk   Fear of Current or Ex-Partner: No   Emotionally Abused: No   Physically Abused: No   Sexually Abused: No    Review of Systems: See HPI, otherwise negative ROS  Physical Exam: Pulse (!) 57    Temp (!) 96.3 F (35.7 C) (Temporal)    Resp 16    Ht 5\' 5"  (1.651 m)    Wt 75.8 kg    SpO2 100%    BMI 27.79 kg/m  General:   Alert,  pleasant and cooperative in NAD Head:  Normocephalic and atraumatic. Neck:  Supple; no masses or thyromegaly. Lungs:  Clear throughout to auscultation, normal respiratory effort.    Heart:  +S1, +S2, Regular rate and rhythm, No edema. Abdomen:  Soft, nontender and nondistended. Normal bowel sounds, without guarding, and without rebound.   Neurologic:  Alert and  oriented x4;  grossly normal neurologically.  Impression/Plan: [name] is here for an colonoscopy to be performed for surveillance due to prior history of colon polyps   Risks, benefits, limitations, and alternatives regarding  colonoscopy have been reviewed with the patient.  Questions  have been answered.  All parties agreeable.   [name], MD  11/21/2021, 10:28 AM

## 2021-11-21 NOTE — Anesthesia Preprocedure Evaluation (Signed)
Anesthesia Evaluation  Patient identified by MRN, date of birth, ID band Patient awake    Reviewed: Allergy & Precautions, H&P , NPO status , Patient's Chart, lab work & pertinent test results, reviewed documented beta blocker date and time   History of Anesthesia Complications Negative for: history of anesthetic complications  Airway Mallampati: II  TM Distance: >3 FB Neck ROM: full    Dental  (+) Dental Advidsory Given, Caps, Partial Lower, Missing, Teeth Intact   Pulmonary neg pulmonary ROS, former smoker,           Cardiovascular Exercise Tolerance: Good hypertension, (-) angina(-) CAD, (-) Past MI, (-) Cardiac Stents and (-) CABG (-) dysrhythmias (-) Valvular Problems/Murmurs     Neuro/Psych negative neurological ROS  negative psych ROS   GI/Hepatic negative GI ROS, Neg liver ROS,   Endo/Other  negative endocrine ROS  Renal/GU Renal disease (kidney stones)  negative genitourinary   Musculoskeletal   Abdominal   Peds  Hematology negative hematology ROS (+)   Anesthesia Other Findings Past Medical History: No date: Cancer ([place])     Comment:  Uterine Cancer No date: Hyperlipidemia No date: Hypertension 06/21/2018: Kidney stones   Reproductive/Obstetrics negative OB ROS                             Anesthesia Physical  Anesthesia Plan  ASA: 2  Anesthesia Plan: General   Post-op Pain Management:    Induction: Intravenous  PONV Risk Score and Plan: 3 and Propofol infusion and TIVA  Airway Management Planned: Natural Airway and Nasal Cannula  Additional Equipment:   Intra-op Plan:   Post-operative Plan:   Informed Consent: I have reviewed the patients History and Physical, chart, labs and discussed the procedure including the risks, benefits and alternatives for the proposed anesthesia with the patient or authorized representative who has indicated his/her understanding and  acceptance.     Dental Advisory Given  Plan Discussed with: Anesthesiologist, CRNA and Surgeon  Anesthesia Plan Comments:         Anesthesia Quick Evaluation

## 2021-11-21 NOTE — Op Note (Signed)
[facility] Gastroenterology Patient Name: Morgan Livingston Procedure Date: 11/21/2021 10:30 AM MRN: [id number] Account #: [id number] Date of Birth: [birth year] Admit Type: Outpatient Age: 76 Room: [facility] ENDO ROOM 2 Gender: Female Note Status: Finalized Instrument Name: Jasper Riling [id number] Procedure:             Colonoscopy Indications:           Surveillance: Personal history of adenomatous polyps                         on last colonoscopy > 3 years ago Providers:             [name] MD, MD Referring MD:          Bethena Roys. Sowles, MD (Referring MD) Medicines:             Monitored Anesthesia Care Complications:         No immediate complications. Procedure:             Pre-Anesthesia Assessment:                        - Prior to the procedure, a History and Physical was                         performed, and patient medications, allergies and                         sensitivities were reviewed. The patient's tolerance                         of previous anesthesia was reviewed.                        - The risks and benefits of the procedure and the                         sedation options and risks were discussed with the                         patient. All questions were answered and informed                         consent was obtained.                        - ASA Grade Assessment: II - A patient with mild                         systemic disease.                        After obtaining informed consent, the colonoscope was                         passed under direct vision. Throughout the procedure,                         the patient's blood pressure, pulse, and oxygen  saturations were monitored continuously. The                         Colonoscope was introduced through the anus with the                         intention of advancing to the cecum. The scope was                         advanced to the descending colon before the  procedure                         was aborted. Medications were given. The colonoscopy                         was performed with ease. The patient tolerated the                         procedure well. The quality of the bowel preparation                         was poor. Findings:      The perianal and digital rectal examinations were normal.      A large amount of solid stool was found in the sigmoid colon and in the       descending colon, interfering with visualization. Impression:            - Preparation of the colon was poor.                        - Stool in the sigmoid colon and in the descending                         colon.                        - No specimens collected. Recommendation:        - Discharge patient to home (with escort).                        - Resume previous diet.                        - Continue present medications.                        - Repeat colonoscopy in 2 weeks because the bowel                         preparation was suboptimal. Procedure Code(s):     --- Professional ---                        [phone], 53, Colonoscopy, flexible; diagnostic,                         including collection of specimen(s) by brushing or                         washing, when performed (separate procedure) Diagnosis Code(s):     ---  Professional ---                        Z86.010, Personal history of colonic polyps CPT copyright 2019 American Medical Association. All rights reserved. The codes documented in this report are preliminary and upon coder review may  be revised to meet current compliance requirements. [name], MD [name] MD, MD 11/21/2021 10:43:30 AM This report has been signed electronically. Number of Addenda: 0 Note Initiated On: 11/21/2021 10:30 AM Total Procedure Duration: 0 hours 2 minutes 12 seconds  Estimated Blood Loss:  Estimated blood loss: none.      [facility]

## 2021-11-22 ENCOUNTER — Telehealth: Payer: Self-pay

## 2021-11-22 ENCOUNTER — Encounter: Payer: Self-pay | Admitting: Gastroenterology

## 2021-11-23 NOTE — Telephone Encounter (Signed)
error 

## 2021-11-27 ENCOUNTER — Other Ambulatory Visit: Payer: Self-pay | Admitting: Family Medicine

## 2021-11-27 NOTE — Anesthesia Postprocedure Evaluation (Signed)
Anesthesia Post Note  Patient: Morgan Livingston  Procedure(s) Performed: COLONOSCOPY WITH PROPOFOL  Patient location during evaluation: Endoscopy Anesthesia Type: General Level of consciousness: awake and alert Pain management: pain level controlled Vital Signs Assessment: post-procedure vital signs reviewed and stable Respiratory status: spontaneous breathing, nonlabored ventilation, respiratory function stable and patient connected to nasal cannula oxygen Cardiovascular status: blood pressure returned to baseline and stable Postop Assessment: no apparent nausea or vomiting Anesthetic complications: no   No notable events documented.   Last Vitals:  Vitals:   11/21/21 1057 11/21/21 1107  BP: 108/63 129/83  Pulse: 65 (!) 52  Resp: 18 19  Temp:    SpO2: 100% 99%    Last Pain:  Vitals:   11/21/21 1107  TempSrc:   PainSc: 0-No pain                 Martha Clan

## 2021-11-28 ENCOUNTER — Encounter
Admission: EM | Disposition: A | Discharge status: Home / Self Care | Payer: Self-pay | Source: Home / Self Care | Attending: Emergency Medicine

## 2021-11-28 ENCOUNTER — Emergency Department: Payer: Medicare PPO

## 2021-11-28 ENCOUNTER — Other Ambulatory Visit: Payer: Self-pay

## 2021-11-28 ENCOUNTER — Observation Stay
Admission: EM | Admit: 2021-11-28 | Discharge: 2021-11-29 | Disposition: A | Discharge status: Home / Self Care | Payer: Medicare PPO | Attending: General Surgery | Admitting: General Surgery

## 2021-11-28 ENCOUNTER — Observation Stay: Payer: Medicare PPO | Admitting: Anesthesiology

## 2021-11-28 ENCOUNTER — Encounter: Payer: Self-pay | Admitting: Emergency Medicine

## 2021-11-28 DIAGNOSIS — Z20822 Contact with and (suspected) exposure to covid-19: Secondary | ICD-10-CM | POA: Insufficient documentation

## 2021-11-28 DIAGNOSIS — K35201 Acute appendicitis with generalized peritonitis, with perforation, without abscess: Secondary | ICD-10-CM | POA: Diagnosis present

## 2021-11-28 DIAGNOSIS — K3533 Acute appendicitis with perforation and localized peritonitis, with abscess: Secondary | ICD-10-CM | POA: Diagnosis not present

## 2021-11-28 DIAGNOSIS — D472 Monoclonal gammopathy: Secondary | ICD-10-CM | POA: Diagnosis not present

## 2021-11-28 DIAGNOSIS — Z87891 Personal history of nicotine dependence: Secondary | ICD-10-CM | POA: Insufficient documentation

## 2021-11-28 DIAGNOSIS — K5790 Diverticulosis of intestine, part unspecified, without perforation or abscess without bleeding: Secondary | ICD-10-CM | POA: Diagnosis not present

## 2021-11-28 DIAGNOSIS — Z8542 Personal history of malignant neoplasm of other parts of uterus: Secondary | ICD-10-CM | POA: Insufficient documentation

## 2021-11-28 DIAGNOSIS — Z79899 Other long term (current) drug therapy: Secondary | ICD-10-CM | POA: Insufficient documentation

## 2021-11-28 DIAGNOSIS — R109 Unspecified abdominal pain: Secondary | ICD-10-CM | POA: Diagnosis not present

## 2021-11-28 DIAGNOSIS — I10 Essential (primary) hypertension: Secondary | ICD-10-CM | POA: Diagnosis not present

## 2021-11-28 DIAGNOSIS — K3532 Acute appendicitis with perforation and localized peritonitis, without abscess: Principal | ICD-10-CM | POA: Insufficient documentation

## 2021-11-28 DIAGNOSIS — K352 Acute appendicitis with generalized peritonitis, without abscess: Secondary | ICD-10-CM | POA: Diagnosis present

## 2021-11-28 DIAGNOSIS — I700 Atherosclerosis of aorta: Secondary | ICD-10-CM | POA: Diagnosis not present

## 2021-11-28 DIAGNOSIS — R1031 Right lower quadrant pain: Secondary | ICD-10-CM | POA: Diagnosis present

## 2021-11-28 HISTORY — PX: XI ROBOTIC LAPAROSCOPIC ASSISTED APPENDECTOMY: SHX6877

## 2021-11-28 LAB — URINALYSIS, ROUTINE W REFLEX MICROSCOPIC
Glucose, UA: NEGATIVE mg/dL
Hgb urine dipstick: NEGATIVE
Nitrite: NEGATIVE
Protein, ur: 100 mg/dL — AB
Specific Gravity, Urine: 1.025 (ref 1.005–1.030)
pH: 5 (ref 5.0–8.0)

## 2021-11-28 LAB — COMPREHENSIVE METABOLIC PANEL
ALT: 13 U/L (ref 0–44)
AST: 21 U/L (ref 15–41)
Albumin: 3.4 g/dL — ABNORMAL LOW (ref 3.5–5.0)
Alkaline Phosphatase: 37 U/L — ABNORMAL LOW (ref 38–126)
Anion gap: 10 (ref 5–15)
BUN: 22 mg/dL (ref 8–23)
CO2: 23 mmol/L (ref 22–32)
Calcium: 9.3 mg/dL (ref 8.9–10.3)
Chloride: 102 mmol/L (ref 98–111)
Creatinine, Ser: 1.44 mg/dL — ABNORMAL HIGH (ref 0.44–1.00)
GFR, Estimated: 38 mL/min — ABNORMAL LOW (ref >60)
Glucose, Bld: 135 mg/dL — ABNORMAL HIGH (ref 70–99)
Potassium: 3.5 mmol/L (ref 3.5–5.1)
Sodium: 135 mmol/L (ref 135–145)
Total Bilirubin: 0.7 mg/dL (ref 0.3–1.2)
Total Protein: 7.1 g/dL (ref 6.5–8.1)

## 2021-11-28 LAB — LACTIC ACID, PLASMA
Lactic Acid, Venous: 1.6 mmol/L (ref 0.5–1.9)
Lactic Acid, Venous: 2 mmol/L (ref 0.5–1.9)

## 2021-11-28 LAB — CBC
HCT: 35.5 % — ABNORMAL LOW (ref 36.0–46.0)
Hemoglobin: 12.5 g/dL (ref 12.0–15.0)
MCH: 32.6 pg (ref 26.0–34.0)
MCHC: 35.2 g/dL (ref 30.0–36.0)
MCV: 92.7 fL (ref 80.0–100.0)
Platelets: 116 10*3/uL — ABNORMAL LOW (ref 150–400)
RBC: 3.83 MIL/uL — ABNORMAL LOW (ref 3.87–5.11)
RDW: 13.2 % (ref 11.5–15.5)
WBC: 6.2 10*3/uL (ref 4.0–10.5)
nRBC: 0 % (ref 0.0–0.2)

## 2021-11-28 LAB — LIPASE, BLOOD: Lipase: 36 U/L (ref 11–51)

## 2021-11-28 LAB — RESP PANEL BY RT-PCR (FLU A&B, COVID) ARPGX2
Influenza A by PCR: NEGATIVE
Influenza B by PCR: NEGATIVE
SARS Coronavirus 2 by RT PCR: NEGATIVE

## 2021-11-28 LAB — URINALYSIS, MICROSCOPIC (REFLEX): Squamous Epithelial / HPF: >50 (ref 0–5)

## 2021-11-28 SURGERY — APPENDECTOMY, ROBOT-ASSISTED, LAPAROSCOPIC
Anesthesia: General

## 2021-11-28 MED ORDER — KETAMINE HCL 50 MG/5ML IJ SOSY
PREFILLED_SYRINGE | INTRAMUSCULAR | Status: AC
  Filled 2021-11-28: qty 5

## 2021-11-28 MED ORDER — LACTATED RINGERS IV SOLN: INTRAVENOUS | Status: DC

## 2021-11-28 MED ORDER — PIPERACILLIN-TAZOBACTAM 3.375 G IVPB
3.3750 g | Freq: Three times a day (TID) | INTRAVENOUS | Status: DC
  Administered 2021-11-28 – 2021-11-29 (×3): 3.375 g via INTRAVENOUS
  Filled 2021-11-28 (×3): qty 50

## 2021-11-28 MED ORDER — SODIUM CHLORIDE 0.9 % IV BOLUS
1000.0000 mL | Freq: Once | INTRAVENOUS | Status: AC
  Administered 2021-11-28: 08:00:00 1000 mL via INTRAVENOUS

## 2021-11-28 MED ORDER — LOSARTAN POTASSIUM-HCTZ 50-12.5 MG PO TABS: 1.0000 | ORAL_TABLET | Freq: Every day | ORAL | Status: DC

## 2021-11-28 MED ORDER — SODIUM CHLORIDE 0.9 % IV SOLN: INTRAVENOUS | Status: DC

## 2021-11-28 MED ORDER — CEFAZOLIN SODIUM-DEXTROSE 1-4 GM/50ML-% IV SOLN
INTRAVENOUS | Status: DC | PRN
  Administered 2021-11-28: 2 g via INTRAVENOUS

## 2021-11-28 MED ORDER — PHENYLEPHRINE HCL-NACL 20-0.9 MG/250ML-% IV SOLN
INTRAVENOUS | Status: DC | PRN
  Administered 2021-11-28: 20 ug/min via INTRAVENOUS

## 2021-11-28 MED ORDER — CEFAZOLIN SODIUM 1 G IJ SOLR
INTRAMUSCULAR | Status: AC
  Filled 2021-11-28: qty 10

## 2021-11-28 MED ORDER — DEXAMETHASONE SODIUM PHOSPHATE 10 MG/ML IJ SOLN
INTRAMUSCULAR | Status: DC | PRN
  Administered 2021-11-28: 5 mg via INTRAVENOUS

## 2021-11-28 MED ORDER — SUCCINYLCHOLINE CHLORIDE 200 MG/10ML IV SOSY
PREFILLED_SYRINGE | INTRAVENOUS | Status: DC | PRN
  Administered 2021-11-28: 100 mg via INTRAVENOUS

## 2021-11-28 MED ORDER — SUGAMMADEX SODIUM 200 MG/2ML IV SOLN
INTRAVENOUS | Status: DC | PRN
  Administered 2021-11-28: 200 mg via INTRAVENOUS

## 2021-11-28 MED ORDER — ENOXAPARIN SODIUM 40 MG/0.4ML IJ SOSY
40.0000 mg | PREFILLED_SYRINGE | INTRAMUSCULAR | Status: DC
  Administered 2021-11-29: 09:00:00 40 mg via SUBCUTANEOUS
  Filled 2021-11-28: qty 0.4

## 2021-11-28 MED ORDER — PROPOFOL 10 MG/ML IV BOLUS
INTRAVENOUS | Status: DC | PRN
  Administered 2021-11-28: 100 mg via INTRAVENOUS

## 2021-11-28 MED ORDER — FENTANYL CITRATE (PF) 100 MCG/2ML IJ SOLN: 25.0000 ug | INTRAMUSCULAR | Status: DC | PRN

## 2021-11-28 MED ORDER — ROCURONIUM BROMIDE 10 MG/ML (PF) SYRINGE
PREFILLED_SYRINGE | INTRAVENOUS | Status: AC
  Filled 2021-11-28: qty 10

## 2021-11-28 MED ORDER — DEXMEDETOMIDINE (PRECEDEX) IN NS 20 MCG/5ML (4 MCG/ML) IV SYRINGE
PREFILLED_SYRINGE | INTRAVENOUS | Status: AC
  Filled 2021-11-28: qty 5

## 2021-11-28 MED ORDER — FENTANYL CITRATE PF 50 MCG/ML IJ SOSY
50.0000 ug | PREFILLED_SYRINGE | Freq: Once | INTRAMUSCULAR | Status: AC
  Administered 2021-11-28: 12:00:00 50 ug via INTRAVENOUS

## 2021-11-28 MED ORDER — ONDANSETRON HCL 4 MG/2ML IJ SOLN: 4.0000 mg | Freq: Once | INTRAMUSCULAR | Status: DC | PRN

## 2021-11-28 MED ORDER — KETAMINE HCL 50 MG/ML IJ SOLN
INTRAMUSCULAR | Status: DC | PRN
  Administered 2021-11-28: 20 mg via INTRAMUSCULAR

## 2021-11-28 MED ORDER — HYDROCHLOROTHIAZIDE 12.5 MG PO TABS
12.5000 mg | ORAL_TABLET | Freq: Every day | ORAL | Status: DC
  Filled 2021-11-28 (×2): qty 1

## 2021-11-28 MED ORDER — ONDANSETRON HCL 4 MG/2ML IJ SOLN
4.0000 mg | Freq: Once | INTRAMUSCULAR | Status: AC
  Administered 2021-11-28: 08:00:00 4 mg via INTRAVENOUS
  Filled 2021-11-28: qty 2

## 2021-11-28 MED ORDER — OXYCODONE HCL 5 MG PO TABS: 5.0000 mg | ORAL_TABLET | Freq: Once | ORAL | Status: DC | PRN

## 2021-11-28 MED ORDER — PROPOFOL 10 MG/ML IV BOLUS
INTRAVENOUS | Status: AC
  Filled 2021-11-28: qty 20

## 2021-11-28 MED ORDER — OXYCODONE HCL 5 MG/5ML PO SOLN: 5.0000 mg | Freq: Once | ORAL | Status: DC | PRN

## 2021-11-28 MED ORDER — FENTANYL CITRATE PF 50 MCG/ML IJ SOSY
PREFILLED_SYRINGE | INTRAMUSCULAR | Status: AC
  Filled 2021-11-28: qty 1

## 2021-11-28 MED ORDER — BUPIVACAINE-EPINEPHRINE (PF) 0.5% -1:200000 IJ SOLN
INTRAMUSCULAR | Status: AC
  Filled 2021-11-28: qty 30

## 2021-11-28 MED ORDER — FENTANYL CITRATE (PF) 100 MCG/2ML IJ SOLN
INTRAMUSCULAR | Status: DC | PRN
  Administered 2021-11-28 (×4): 25 ug via INTRAVENOUS

## 2021-11-28 MED ORDER — DEXMEDETOMIDINE (PRECEDEX) IN NS 20 MCG/5ML (4 MCG/ML) IV SYRINGE
PREFILLED_SYRINGE | INTRAVENOUS | Status: DC | PRN
  Administered 2021-11-28 (×2): 6 ug via INTRAVENOUS

## 2021-11-28 MED ORDER — ONDANSETRON HCL 4 MG/2ML IJ SOLN
INTRAMUSCULAR | Status: AC
  Filled 2021-11-28: qty 2

## 2021-11-28 MED ORDER — ACETAMINOPHEN 325 MG PO TABS: 650.0000 mg | ORAL_TABLET | Freq: Four times a day (QID) | ORAL | Status: DC | PRN

## 2021-11-28 MED ORDER — PIPERACILLIN-TAZOBACTAM 3.375 G IVPB 30 MIN
3.3750 g | Freq: Once | INTRAVENOUS | Status: AC
Start: 2021-11-28 — End: 2021-11-28
  Administered 2021-11-28: 09:00:00 3.375 g via INTRAVENOUS
  Filled 2021-11-28: qty 50

## 2021-11-28 MED ORDER — ONDANSETRON HCL 4 MG/2ML IJ SOLN
4.0000 mg | Freq: Four times a day (QID) | INTRAMUSCULAR | Status: DC | PRN
  Administered 2021-11-28: 13:00:00 4 mg via INTRAVENOUS

## 2021-11-28 MED ORDER — ACETAMINOPHEN 10 MG/ML IV SOLN: 1000.0000 mg | Freq: Once | INTRAVENOUS | Status: DC | PRN

## 2021-11-28 MED ORDER — IOHEXOL 300 MG/ML  SOLN
100.0000 mL | Freq: Once | INTRAMUSCULAR | Status: AC | PRN
  Administered 2021-11-28: 08:00:00 100 mL via INTRAVENOUS
  Filled 2021-11-28: qty 100

## 2021-11-28 MED ORDER — ONDANSETRON 4 MG PO TBDP: 4.0000 mg | ORAL_TABLET | Freq: Four times a day (QID) | ORAL | Status: DC | PRN

## 2021-11-28 MED ORDER — HYDROCODONE-ACETAMINOPHEN 5-325 MG PO TABS: 1.0000 | ORAL_TABLET | ORAL | Status: DC | PRN

## 2021-11-28 MED ORDER — BUPIVACAINE-EPINEPHRINE (PF) 0.5% -1:200000 IJ SOLN
INTRAMUSCULAR | Status: DC | PRN
  Administered 2021-11-28: 30 mL

## 2021-11-28 MED ORDER — MORPHINE SULFATE (PF) 4 MG/ML IV SOLN: 4.0000 mg | INTRAVENOUS | Status: DC | PRN

## 2021-11-28 MED ORDER — LIDOCAINE HCL (CARDIAC) PF 100 MG/5ML IV SOSY
PREFILLED_SYRINGE | INTRAVENOUS | Status: DC | PRN
Start: 2021-11-28 — End: 2021-11-28
  Administered 2021-11-28: 60 mg via INTRAVENOUS

## 2021-11-28 MED ORDER — LOSARTAN POTASSIUM 50 MG PO TABS
50.0000 mg | ORAL_TABLET | Freq: Every day | ORAL | Status: DC
  Filled 2021-11-28 (×2): qty 1

## 2021-11-28 MED ORDER — FENTANYL CITRATE (PF) 100 MCG/2ML IJ SOLN
INTRAMUSCULAR | Status: AC
  Filled 2021-11-28: qty 2

## 2021-11-28 MED ORDER — 0.9 % SODIUM CHLORIDE (POUR BTL) OPTIME
TOPICAL | Status: DC | PRN
  Administered 2021-11-28: 13:00:00 500 mL

## 2021-11-28 MED ORDER — ROCURONIUM BROMIDE 100 MG/10ML IV SOLN
INTRAVENOUS | Status: DC | PRN
  Administered 2021-11-28: 50 mg via INTRAVENOUS

## 2021-11-28 MED ORDER — PANTOPRAZOLE SODIUM 40 MG IV SOLR
40.0000 mg | Freq: Every day | INTRAVENOUS | Status: DC
  Administered 2021-11-28: 21:00:00 40 mg via INTRAVENOUS
  Filled 2021-11-28: qty 40

## 2021-11-28 MED ORDER — FENTANYL CITRATE PF 50 MCG/ML IJ SOSY
50.0000 ug | PREFILLED_SYRINGE | Freq: Once | INTRAMUSCULAR | Status: AC
Start: 2021-11-28 — End: 2021-11-28
  Administered 2021-11-28: 08:00:00 50 ug via INTRAVENOUS
  Filled 2021-11-28: qty 1

## 2021-11-28 MED ORDER — ACETAMINOPHEN 650 MG RE SUPP: 650.0000 mg | Freq: Four times a day (QID) | RECTAL | Status: DC | PRN

## 2021-11-28 SURGICAL SUPPLY — 66 items
BAG INFUSER PRESSURE 100CC (MISCELLANEOUS) ×2 IMPLANT
BLADE SURG SZ11 CARB STEEL (BLADE) ×3 IMPLANT
CANNULA REDUC XI 12-8 STAPL (CANNULA) ×1
CANNULA REDUC XI 12-8MM STAPL (CANNULA) ×1
CANNULA REDUCER 12-8 DVNC XI (CANNULA) ×1 IMPLANT
COVER TIP SHEARS 8 DVNC (MISCELLANEOUS) ×1 IMPLANT
COVER TIP SHEARS 8MM DA VINCI (MISCELLANEOUS) ×2
DERMABOND ADVANCED (GAUZE/BANDAGES/DRESSINGS) ×2
DERMABOND ADVANCED .7 DNX12 (GAUZE/BANDAGES/DRESSINGS) ×1 IMPLANT
DRAPE ARM DVNC X/XI (DISPOSABLE) ×4 IMPLANT
DRAPE COLUMN DVNC XI (DISPOSABLE) ×1 IMPLANT
DRAPE DA VINCI XI ARM (DISPOSABLE) ×8
DRAPE DA VINCI XI COLUMN (DISPOSABLE) ×2
ELECT REM PT RETURN 9FT ADLT (ELECTROSURGICAL) ×3
ELECTRODE REM PT RTRN 9FT ADLT (ELECTROSURGICAL) ×1 IMPLANT
GLOVE SURG SYN 6.5 ES PF (GLOVE) ×6 IMPLANT
GLOVE SURG SYN 6.5 PF PI (GLOVE) ×2 IMPLANT
GLOVE SURG UNDER POLY LF SZ6.5 (GLOVE) ×6 IMPLANT
GOWN STRL REUS W/ TWL LRG LVL3 (GOWN DISPOSABLE) ×3 IMPLANT
GOWN STRL REUS W/TWL LRG LVL3 (GOWN DISPOSABLE) ×6
GRASPER SUT TROCAR 14GX15 (MISCELLANEOUS) IMPLANT
IRRIGATOR SUCT 8 DISP DVNC XI (IRRIGATION / IRRIGATOR) IMPLANT
IRRIGATOR SUCTION 8MM XI DISP (IRRIGATION / IRRIGATOR) ×2
IV NS 1000ML (IV SOLUTION) ×2
IV NS 1000ML BAXH (IV SOLUTION) IMPLANT
KIT PINK PAD W/HEAD ARE REST (MISCELLANEOUS) ×3 IMPLANT
KIT PINK PAD W/HEAD ARM REST (MISCELLANEOUS) ×1 IMPLANT
LABEL OR SOLS (LABEL) IMPLANT
MANIFOLD NEPTUNE II (INSTRUMENTS) ×3 IMPLANT
NDL INSUFFLATION 14GA 120MM (NEEDLE) ×1 IMPLANT
NEEDLE HYPO 22GX1.5 SAFETY (NEEDLE) ×3 IMPLANT
NEEDLE INSUFFLATION 14GA 120MM (NEEDLE) ×3 IMPLANT
OBTURATOR OPTICAL STANDARD 8MM (TROCAR) ×2
OBTURATOR OPTICAL STND 8 DVNC (TROCAR) ×1
OBTURATOR OPTICALSTD 8 DVNC (TROCAR) ×1 IMPLANT
PACK LAP CHOLECYSTECTOMY (MISCELLANEOUS) ×3 IMPLANT
POUCH SPECIMEN RETRIEVAL 10MM (ENDOMECHANICALS) ×3 IMPLANT
RELOAD STAPLE 45 2.5 WHT DVNC (STAPLE) IMPLANT
RELOAD STAPLE 45 3.5 BLU DVNC (STAPLE) ×1 IMPLANT
RELOAD STAPLER 2.5X45 WHT DVNC (STAPLE) IMPLANT
RELOAD STAPLER 3.5X45 BLU DVNC (STAPLE) ×1 IMPLANT
SEAL CANN UNIV 5-8 DVNC XI (MISCELLANEOUS) ×3 IMPLANT
SEAL XI 5MM-8MM UNIVERSAL (MISCELLANEOUS) ×6
SEALER VESSEL DA VINCI XI (MISCELLANEOUS) ×2
SEALER VESSEL EXT DVNC XI (MISCELLANEOUS) IMPLANT
SET TUBE SMOKE EVAC HIGH FLOW (TUBING) ×3 IMPLANT
SOLUTION ELECTROLUBE (MISCELLANEOUS) ×3 IMPLANT
SPONGE T-LAP 4X18 ~~LOC~~+RFID (SPONGE) ×3 IMPLANT
STAPLER 45 DA VINCI SURE FORM (STAPLE) ×2
STAPLER 45 SUREFORM DVNC (STAPLE) ×1 IMPLANT
STAPLER CANNULA SEAL DVNC XI (STAPLE) ×1 IMPLANT
STAPLER CANNULA SEAL XI (STAPLE) ×2
STAPLER RELOAD 2.5X45 WHITE (STAPLE)
STAPLER RELOAD 2.5X45 WHT DVNC (STAPLE)
STAPLER RELOAD 3.5X45 BLU DVNC (STAPLE) ×1
STAPLER RELOAD 3.5X45 BLUE (STAPLE) ×2
SUT MNCRL AB 4-0 PS2 18 (SUTURE) ×3 IMPLANT
SUT VIC AB 2-0 SH 27 (SUTURE) ×2
SUT VIC AB 2-0 SH 27XBRD (SUTURE) ×1 IMPLANT
SUT VICRYL 0 AB UR-6 (SUTURE) ×3 IMPLANT
SUT VLOC 90 6 CV-15 VIOLET (SUTURE) ×2 IMPLANT
SUT VLOC 90 6" CV-15 VIOLET (SUTURE) ×1
SUT VLOC 90 S/L VL9 GS22 (SUTURE) ×2 IMPLANT
SYR 30ML LL (SYRINGE) ×3 IMPLANT
TRAY FOLEY MTR SLVR 16FR STAT (SET/KITS/TRAYS/PACK) ×3 IMPLANT
WATER STERILE IRR 500ML POUR (IV SOLUTION) ×1 IMPLANT

## 2021-11-28 NOTE — ED Notes (Signed)
Pt back from CT

## 2021-11-28 NOTE — ED Notes (Signed)
See triage note presents with lower abd pain  states she developed some dry heaves and abd discomfort on Sunday after eating  states she has not been able ot eat  and pain is localized in lower abd this am

## 2021-11-28 NOTE — ED Notes (Signed)
Pt transported to CT ?

## 2021-11-28 NOTE — Anesthesia Procedure Notes (Signed)
Procedure Name: Intubation Date/Time: 11/28/2021 11:59 AM Performed by: Bea Graff, RN Pre-anesthesia Checklist: Patient identified, Emergency Drugs available, Suction available and Patient being monitored Patient Re-evaluated:Patient Re-evaluated prior to induction Oxygen Delivery Method: Circle system utilized Preoxygenation: Pre-oxygenation with 100% oxygen Induction Type: IV induction and Rapid sequence Laryngoscope Size: Glidescope and 3 Grade View: Grade I Tube type: Oral Tube size: 7.0 mm Number of attempts: 1 Airway Equipment and Method: Stylet and Oral airway Placement Confirmation: ETT inserted through vocal cords under direct vision, positive ETCO2 and breath sounds checked- equal and bilateral Secured at: 21 cm Tube secured with: Tape Dental Injury: Teeth and Oropharynx as per pre-operative assessment

## 2021-11-28 NOTE — ED Notes (Addendum)
General surgeon at bedside. Rn will obtain consent

## 2021-11-28 NOTE — Op Note (Signed)
Pre-op Diagnosis: Acute appendicitis   Post op Diagnosis: Acute purulent appenditicis  Procedure: Robotic assisted laparoscopic appendectomy.  Anesthesia: GETA  Surgeon: Herbert Pun, MD, FACS  Wound Classification: Contaminated  Specimen: Appendix  Complications: None  Estimated Blood Loss: 3 mL   Indications: Patient is a 76 y.o. female  presented with above right lower quadrant pain. CT scan shows acute perforated appendicitis.     FIndings: 1.  Perforated appendicitis with purulent peritonitis 2.  Abundant amount right lower quadrant and pelvic pus  3. Normal anatomy 4. Adequate hemostasis.   Page 1: Right lower quadrant Page 2: Purulent peritonitis Page 2: Perforated appendix        Description of procedure: The patient was placed on the operating table in the supine position. General anesthesia was induced. A time-out was completed verifying correct patient, procedure, site, positioning, and implant(s) and/or special equipment prior to beginning this procedure. The abdomen was prepped and draped in the usual sterile fashion.   Palmer's point located and Veress needle was inserted.  After confirming 2 clicks and a positive saline drop test, gas insufflation was initiated until the abdominal pressure was measured at 15 mmHg.  Afterwards, the Veress needle was removed and a 8 mm port was placed in left upper quadrant area using Optiview technique.  After local was infused, 3 additional incision on the left abdominal wall were made 5 cm apart.  An 12 mm port and two other 8 mm ports were placed under direct visualization.  No injuries from trocar placements were noted.  The table was placed in the Trendelenburg position with the right side elevated.  With the use of Tip up grasper, Fenestrated Bipolar and Vessel sealer, an perforated appendix with purulent peritonitis was identified and elevated.  Window created at base of appendix in the mesentery.    The mesentery  of the appendix was divided with Vesel sealer and the base of the appendix was ligated with a purse string with 2-0 Vloc. The appendix was divided with scissors and the stump was the closed in a second layer with the 2-0 Vloc.   The appendiceal stump was examined and hemostasis noted. No other pathology was identified within pelvis. Right lower quadrant and pelvic abscess was aspirated with suction device. The 12 mm trocar removed, appendix removed in bag and port site closed with PMI using 0 vicryl under direct vision. Remaining trocars were removed under direct vision. No bleeding was noted.The abdomen was allowed to collapse.  All skin incisions then closed with subcuticular sutures Monocryl 4-0.  Wounds then dressed with dermabond.  The patient tolerated the procedure well, awakened from anesthesia and was taken to the postanesthesia care unit in satisfactory condition.  Sponge count and instrument count correct at the end of the procedure.

## 2021-11-28 NOTE — Consult Note (Signed)
[facility] Medical Consultation  Morgan Livingston VFI:[id number] DOB: 07-10-1946 DOA: 11/28/2021 PCP: Steele Sizer, MD   Requesting physician: Dr Windell Moment Date of consultation: 11/28/21 Reason for consultation: Management of medical problems  Impression/Recommendations Principal Problem:   Benign hypertension Active Problems:   DD (diverticular disease)   MGUS (monoclonal gammopathy of unknown significance)   Acute appendicitis with perforation and generalized peritonitis    Hypertension with stage III chronic kidney disease Hold losartan/hydrochlorothiazide since patient is normotensive Renal function appears stable Judicious IV fluid hydration    2.  Dyslipidemia Hold statins while patient is n.p.o. Will resume once appropriate   3.  Perforated appendicitis Further treatment plan per surgery    I will followup again tomorrow. Please contact me if I can be of assistance in the meanwhile. Thank you for this consultation.  Chief Complaint: Abdominal pain  HPI:  Patient is a 76 year old African-American female with a past medical history significant for hypertension with stage III chronic kidney disease and dyslipidemia who presents to the ER for evaluation of a 3-day history of abdominal pain mostly in the right lower quadrant associated with dry heaving and poor oral intake.  She denies having any emesis.  She presented to the ER due to increased intensity of her pain which she rated an 8 x 10 in intensity at its worst.  She denied having any changes in her bowel habits and denied having any fever or chills.  She denied feeling dizzy or lightheaded and has had no urinary symptoms. She had a CT scan of abdomen and pelvis which showed inflammatory changes in the right lower quadrant centered around the appendix and terminal ileum.  Findings are most compatible with a perforated appendicitis.  Surgery was consulted by ER physician and medical consult was  requested.  Review of Systems:  As per HPI otherwise all other systems reviewed and negative.      Past Medical History:  Diagnosis Date   Allergy    Cancer [facility])    Uterine Cancer   Hyperlipidemia    Hypertension    Kidney stones 06/21/2018   Past Surgical History:  Procedure Laterality Date   ABDOMINAL HYSTERECTOMY     BREAST BIOPSY Right 04/09/2006   negative   BREAST BIOPSY Left 12/04/2017   fibroadenmatoid change with coarse calcs   COLONOSCOPY WITH PROPOFOL N/A 10/07/2018   Procedure: COLONOSCOPY WITH PROPOFOL;  Surgeon: [name], MD;  Location: [facility] ENDOSCOPY;  Service: Gastroenterology;  Laterality: N/A;   COLONOSCOPY WITH PROPOFOL N/A 11/21/2021   Procedure: COLONOSCOPY WITH PROPOFOL;  Surgeon: [name], MD;  Location: [facility] ENDOSCOPY;  Service: Gastroenterology;  Laterality: N/A;   LIPOMA EXCISION Left 04/15/2020   Procedure: EXCISION LIPOMA, left arm;  Surgeon: Fredirick Maudlin, MD;  Location: [facility] ORS;  Service: General;  Laterality: Left;   Social History:  reports that she quit smoking about 60 years ago. Her smoking use included cigarettes. She has a 2.00 pack-year smoking history. She has never used smokeless tobacco. She reports that she does not drink alcohol and does not use drugs.  No Known Allergies Family History  Problem Relation Age of Onset   Congestive Heart Failure Mother 56   Heart attack Father 69   Sinusitis Sister    Stomach cancer Sister    Uterine cancer Sister 39   Liver cancer Sister    Pancreatic cancer Sister    Dementia Sister        9   Breast cancer Neg Hx  Prior to Admission medications   Medication Sig Start Date End Date Taking? Authorizing Provider  acetaminophen (TYLENOL) 500 MG tablet Take 2 tablets (1,000 mg total) by mouth every 6 (six) hours as needed for mild pain or headache. 04/15/20  Yes Fredirick Maudlin, MD  aspirin 81 MG tablet Take 81 mg by mouth daily.    [provider]  Bartolo Darter COVID-19 AG HOME  TEST KIT See admin instructions. 11/02/21   [provider]  Biotin 5 MG CAPS Take by mouth daily.    [provider]  carboxymethylcellulose (REFRESH PLUS) 0.5 % SOLN Place 1 drop into both eyes daily.    [provider]  cholecalciferol (VITAMIN D) 1000 UNITS tablet Take 1,000 Units by mouth daily.     [provider]  ezetimibe (ZETIA) 10 MG tablet Take 1 tablet (10 mg total) by mouth daily. 10/18/21   Steele Sizer, MD  ferrous sulfate 324 MG TBEC Take 65 mg by mouth daily with breakfast.    [provider]  fexofenadine (ALLEGRA) 180 MG tablet Take 180 mg by mouth daily.    [provider]  losartan-hydrochlorothiazide (HYZAAR) 50-12.5 MG tablet Take 1 tablet by mouth daily. 10/18/21   Steele Sizer, MD  rosuvastatin (CRESTOR) 20 MG tablet Take 1 tablet (20 mg total) by mouth at bedtime. 10/18/21   Steele Sizer, MD  vitamin B-12 (CYANOCOBALAMIN) 1000 MCG tablet Take 1 tablet (1,000 mcg total) by mouth daily. Patient taking differently: Take 1,000 mcg by mouth once a week. Wednesday 03/23/19   Earlie Server, MD  vitamin E 1000 UNIT capsule Take 1,000 Units by mouth 3 (three) times a week.    [provider]   Physical Exam: Blood pressure 93/64, pulse 84, temperature 98.9 F (37.2 C), temperature source Oral, resp. rate 18, height 5' 5"  (1.651 m), weight 76.7 kg, SpO2 99 %. Vitals:   11/28/21 0711  BP: 93/64  Pulse: 84  Resp: 18  Temp: 98.9 F (37.2 C)  SpO2: 99%    General: Middle-aged female lying in bed in no obvious distress Eyes: Pale conjunctiva ENT: Within normal limits Neck: Supple, no JVD Cardiovascular: RRR S1,S2 Respiratory: Bilateral air entry Abdomen: Bowel sounds are hypoactive, diffusely tender but localized to the right lower quadrant Skin: Warm and dry Musculoskeletal: Normal range of motion Psychiatric: Normal mood and affect Neurologic: Able to move all extremities  Labs on Admission:  Basic  Metabolic Panel: Recent Labs  Lab 11/28/21 0713  NA 135  K 3.5  CL 102  CO2 23  GLUCOSE 135*  BUN 22  CREATININE 1.44*  CALCIUM 9.3   Liver Function Tests: Recent Labs  Lab 11/28/21 0713  AST 21  ALT 13  ALKPHOS 37*  BILITOT 0.7  PROT 7.1  ALBUMIN 3.4*   Recent Labs  Lab 11/28/21 0713  LIPASE 36   No results for input(s): AMMONIA in the last 168 hours. CBC: Recent Labs  Lab 11/28/21 0713  WBC 6.2  HGB 12.5  HCT 35.5*  MCV 92.7  PLT 116*   Cardiac Enzymes: No results for input(s): CKTOTAL, CKMB, CKMBINDEX, TROPONINI in the last 168 hours. BNP: Invalid input(s): POCBNP CBG: No results for input(s): GLUCAP in the last 168 hours.  Radiological Exams on Admission: CT ABDOMEN PELVIS W CONTRAST  Result Date: 11/28/2021 CLINICAL DATA:  Abdominal pain, acute, nonlocalized. EXAM: CT ABDOMEN AND PELVIS WITH CONTRAST TECHNIQUE: Multidetector CT imaging of the abdomen and pelvis was performed using the standard protocol following bolus administration  of intravenous contrast. RADIATION DOSE REDUCTION: This exam was performed according to the departmental dose-optimization program which includes automated exposure control, adjustment of the mA and/or kV according to patient size and/or use of iterative reconstruction technique. CONTRAST:  137m OMNIPAQUE IOHEXOL 300 MG/ML  SOLN COMPARISON:  09/30/2018 FINDINGS: Lower chest: Lung bases are clear. Hepatobiliary: Normal appearance of the liver, gallbladder and portal venous system. Pancreas: Unremarkable. No pancreatic ductal dilatation or surrounding inflammatory changes. Spleen: Normal in size without focal abnormality. Adrenals/Urinary Tract: Normal adrenal glands. Mild dilatation of the renal pelvis in both kidneys appears chronic. No hydronephrosis. No suspicious renal lesions. Normal appearance of the urinary bladder. Stomach/Bowel: Prominent diverticula in the sigmoid colon. Inflammatory changes in the right lower quadrant  centered around the appendix and terminal ileum. Inflammatory changes around the appendix are best seen on the sagittal images, sequence 3 image 54 with a small appendicolith and wall thickening near the tip of the appendix. Findings are suggestive for acute appendicitis with perforation. Mild wall thickening involving the terminal ileum best the on sequence 4, image 59. Tiny pockets of free air in the right lower quadrant best seen on sequence 4, image 56. Additional areas of small bowel thickening in the lower abdomen and pelvis. Small hiatal hernia. No evidence for bowel dilatation or obstruction. Vascular/Lymphatic: Distal abdominal aorta measures 2.5 cm. Atherosclerotic calcifications in the aorta and iliac arteries. No lymph node enlargement in the abdomen or pelvis. Reproductive: Status post hysterectomy. No adnexal masses. Other: Small amount of free fluid in the pelvis and right lower quadrant. Trace fluid in the left paracolic gutter region. Musculoskeletal: Grade 1 anterolisthesis of L4 on L5 secondary to facet arthropathy. No suspicious bone findings. IMPRESSION: 1. Inflammatory changes in right lower quadrant centered around the appendix and terminal ileum. Findings are most compatible with a perforated appendicitis. Wall thickening involving the distal appendix with a small appendicolith and tiny pockets of gas in the right lower quadrant. There is also a small amount of free fluid in the abdomen and pelvis. 2. Scattered areas of small bowel wall thickening, particularly the terminal ileum. Suspect that the small bowel thickening is secondary to the appendicitis. 3. Aortic Atherosclerosis (ICD10-I70.0). These results were called by telephone at the time of interpretation on 11/28/2021 at 8:27 am to [facility], who verbally acknowledged these results. Electronically Signed   By: AMarkus DaftM.D.   On: 11/28/2021 08:29    EKG: Independently reviewed.   Time spent: 50 minutes  Sanye Ledesma [facility] Pager [phone] If 7PM-7AM, please contact night-coverage [email or web address] Password [facility]1/24/2023, 9:45 AM

## 2021-11-28 NOTE — ED Notes (Signed)
Report given to surgery via phone

## 2021-11-28 NOTE — ED Notes (Signed)
Admission MD at bedside.  

## 2021-11-28 NOTE — H&P (Signed)
SURGICAL CONSULTATION NOTE   HISTORY OF PRESENT ILLNESS (HPI):  76 y.o. female presented to [facility] ED for evaluation of abdominal pain since 2 days ago. Patient reports started feeling abdominal pain in the periumbilical area 2 days ago.pains have radiated to the right upper quadrant.  Pain intensified and she decided to come to the ED.  There has been no alleviating or aggravating factors.  Patient denies fevers.  At the ED she was found with a leukocytosis.  CT scan abdominal pelvis shows severe stranding on the right lower quadrant.  The appendix looks dilated with appendicolith and there is a bubble of free air.  There are free fluid.  Surgery is consulted by Dr. Jari Pigg in this context for evaluation and management of perforated appendicitis.  PAST MEDICAL HISTORY (PMH):  Past Medical History:  Diagnosis Date   Allergy    Cancer ([place])    Uterine Cancer   Hyperlipidemia    Hypertension    Kidney stones 06/21/2018     PAST SURGICAL HISTORY ([place]):  Past Surgical History:  Procedure Laterality Date   ABDOMINAL HYSTERECTOMY     BREAST BIOPSY Right 04/09/2006   negative   BREAST BIOPSY Left 12/04/2017   fibroadenmatoid change with coarse calcs   COLONOSCOPY WITH PROPOFOL N/A 10/07/2018   Procedure: COLONOSCOPY WITH PROPOFOL;  Surgeon: [name], MD;  Location: [facility] ENDOSCOPY;  Service: Gastroenterology;  Laterality: N/A;   COLONOSCOPY WITH PROPOFOL N/A 11/21/2021   Procedure: COLONOSCOPY WITH PROPOFOL;  Surgeon: [name], MD;  Location: [facility] ENDOSCOPY;  Service: Gastroenterology;  Laterality: N/A;   LIPOMA EXCISION Left 04/15/2020   Procedure: EXCISION LIPOMA, left arm;  Surgeon: Fredirick Maudlin, MD;  Location: [facility] ORS;  Service: General;  Laterality: Left;     MEDICATIONS:  Prior to Admission medications   Medication Sig Start Date End Date Taking? Authorizing Provider  acetaminophen (TYLENOL) 500 MG tablet Take 2 tablets (1,000 mg total) by mouth every 6 (six) hours as needed  for mild pain or headache. 04/15/20   Fredirick Maudlin, MD  aspirin 81 MG tablet Take 81 mg by mouth daily.    [provider]  Bartolo Darter COVID-19 AG HOME TEST KIT See admin instructions. 11/02/21   [provider]  Biotin 5 MG CAPS Take by mouth daily.    [provider]  carboxymethylcellulose (REFRESH PLUS) 0.5 % SOLN Place 1 drop into both eyes daily.    [provider]  cholecalciferol (VITAMIN D) 1000 UNITS tablet Take 1,000 Units by mouth daily.     [provider]  ezetimibe (ZETIA) 10 MG tablet Take 1 tablet (10 mg total) by mouth daily. 10/18/21   Steele Sizer, MD  ferrous sulfate 324 MG TBEC Take 65 mg by mouth daily with breakfast.    [provider]  fexofenadine (ALLEGRA) 180 MG tablet Take 180 mg by mouth daily.    [provider]  losartan-hydrochlorothiazide (HYZAAR) 50-12.5 MG tablet Take 1 tablet by mouth daily. 10/18/21   Steele Sizer, MD  rosuvastatin (CRESTOR) 20 MG tablet Take 1 tablet (20 mg total) by mouth at bedtime. 10/18/21   Steele Sizer, MD  vitamin B-12 (CYANOCOBALAMIN) 1000 MCG tablet Take 1 tablet (1,000 mcg total) by mouth daily. Patient taking differently: Take 1,000 mcg by mouth once a week. Wednesday 03/23/19   Earlie Server, MD  vitamin E 1000 UNIT capsule Take 1,000 Units by mouth 3 (three) times a week.    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   Tobacco Use   Smoking status: Former    Packs/day: 1.00    Years: 2.00    Pack years: 2.00    Types: Cigarettes    Quit date: 1963    Years since quitting: 60.1   Smokeless tobacco: Never   Tobacco comments:    smoking cessation  materials not required  Vaping Use   Vaping Use: Never used  Substance and Sexual Activity   Alcohol use: No    Alcohol/week: 0.0 standard drinks   Drug use: No   Sexual activity: Yes    Partners: Male    Birth control/protection: Post-menopausal  Other Topics Concern   Not on file  Social History Narrative   Married for 50 years    Social Determinants of Health   Financial Resource Strain: Low Risk    Difficulty of Paying Living Expenses: Not hard at all  Food Insecurity: No Food Insecurity   Worried About Charity fundraiser in the Last Year: Never true   Arboriculturist in the Last Year: Never true  Transportation Needs: No Transportation Needs   Lack of Transportation (Medical): No   Lack of Transportation (Non-Medical): No  Physical Activity: Insufficiently Active   Days of Exercise per Week: 7 days   Minutes of Exercise per Session: 20 min  Stress: No Stress Concern Present   Feeling of Stress : Not at all  Social Connections: Socially Integrated   Frequency of Communication with Friends and Family: More than three times a week   Frequency of Social Gatherings with Friends and Family: More than three times a week   Attends Religious Services: More than 4 times per year   Active Member of [employer] or Organizations: Yes   Attends Music therapist: More than 4 times per year   Marital Status: Married  Human resources officer Violence: Not At Risk   Fear of Current or Ex-Partner: No   Emotionally Abused: No   Physically Abused: No   Sexually Abused: No      FAMILY HISTORY:  Family History  Problem Relation Age of Onset   Congestive Heart Failure Mother 65   Heart attack Father 12   Sinusitis Sister    Stomach cancer Sister    Uterine cancer Sister 15   Liver cancer Sister    Pancreatic cancer Sister    Dementia Sister        73   Breast cancer Neg Hx      REVIEW OF SYSTEMS:  Constitutional: denies weight loss, fever, chills, or sweats  Eyes: denies  any other vision changes, history of eye injury  ENT: denies sore throat, hearing problems  Respiratory: denies shortness of breath, wheezing  Cardiovascular: denies chest pain, palpitations  Gastrointestinal: positive abdominal pain, nausea and vomiting Genitourinary: denies burning with urination or urinary frequency Musculoskeletal: denies any other joint pains or cramps  Skin: denies any other rashes or skin discolorations  Neurological: denies any other headache, dizziness, weakness  Psychiatric: denies any other depression, anxiety   All other review of systems were negative   VITAL SIGNS:  Temp:  [98.9 F (37.2 C)] 98.9 F (  37.2 C) (01/24 0711) Pulse Rate:  [84] 84 (01/24 0711) Resp:  [18] 18 (01/24 0711) BP: (93)/(64) 93/64 (01/24 0711) SpO2:  [99 %] 99 % (01/24 0711) Weight:  [76.7 kg] 76.7 kg (01/24 0710)     Height: 5' 5"  (165.1 cm) Weight: 76.7 kg BMI (Calculated): 28.12   INTAKE/OUTPUT:  This shift: No intake/output data recorded.  Last 2 shifts: @IOLAST2SHIFTS @   PHYSICAL EXAM:  Constitutional:  -- Normal body habitus  -- Awake, alert, and oriented x3  Eyes:  -- Pupils equally round and reactive to light  -- No scleral icterus  Ear, nose, and throat:  -- No jugular venous distension  Pulmonary:  -- No crackles  -- Equal breath sounds bilaterally -- Breathing non-labored at rest Cardiovascular:  -- S1, S2 present  -- No pericardial rubs Gastrointestinal:  -- Abdomen soft, tender in the right lower quadrant -- No abdominal masses appreciated, pulsatile or otherwise  Musculoskeletal and Integumentary:  -- Wounds or skin discoloration: None appreciated -- Extremities: B/L UE and LE FROM, hands and feet warm, no edema  Neurologic:  -- Motor function: intact and symmetric -- Sensation: intact and symmetric   Labs:  CBC Latest Ref Rng & Units 11/28/2021 07/28/2021 01/24/2021  WBC 4.0 - 10.5 K/uL 6.2 7.0 5.9  Hemoglobin 12.0 - 15.0 g/dL 12.5 13.2 11.5(L)   Hematocrit 36.0 - 46.0 % 35.5(L) 40.3 35.5(L)  Platelets 150 - 400 K/uL 116(L) 134(L) 141(L)   CMP Latest Ref Rng & Units 11/28/2021 01/24/2021 06/22/2020  Glucose 70 - 99 mg/dL 135(H) 94 108(H)  BUN 8 - 23 mg/dL 22 17 14   Creatinine 0.44 - 1.00 mg/dL 1.44(H) 1.56(H) 1.72(H)  Sodium 135 - 145 mmol/L 135 138 141  Potassium 3.5 - 5.1 mmol/L 3.5 4.1 3.6  Chloride 98 - 111 mmol/L 102 103 100  CO2 22 - 32 mmol/L 23 28 32  Calcium 8.9 - 10.3 mg/dL 9.3 9.5 10.1  Total Protein 6.5 - 8.1 g/dL 7.1 7.6 7.0  Total Bilirubin 0.3 - 1.2 mg/dL 0.7 0.7 0.4  Alkaline Phos 38 - 126 U/L 37(L) 51 -  AST 15 - 41 U/L 21 23 21   ALT 0 - 44 U/L 13 15 13     Imaging studies:  CLINICAL DATA:  Abdominal pain, acute, nonlocalized.   EXAM: CT ABDOMEN AND PELVIS WITH CONTRAST   TECHNIQUE: Multidetector CT imaging of the abdomen and pelvis was performed using the standard protocol following bolus administration of intravenous contrast.   RADIATION DOSE REDUCTION: This exam was performed according to the departmental dose-optimization program which includes automated exposure control, adjustment of the mA and/or kV according to patient size and/or use of iterative reconstruction technique.   CONTRAST:  175m OMNIPAQUE IOHEXOL 300 MG/ML  SOLN   COMPARISON:  09/30/2018   FINDINGS: Lower chest: Lung bases are clear.   Hepatobiliary: Normal appearance of the liver, gallbladder and portal venous system.   Pancreas: Unremarkable. No pancreatic ductal dilatation or surrounding inflammatory changes.   Spleen: Normal in size without focal abnormality.   Adrenals/Urinary Tract: Normal adrenal glands. Mild dilatation of the renal pelvis in both kidneys appears chronic. No hydronephrosis. No suspicious renal lesions. Normal appearance of the urinary bladder.   Stomach/Bowel: Prominent diverticula in the sigmoid colon. Inflammatory changes in the right lower quadrant centered around the appendix and terminal  ileum. Inflammatory changes around the appendix are best seen on the sagittal images, sequence 3 image 54 with a small appendicolith and wall thickening near the tip  of the appendix. Findings are suggestive for acute appendicitis with perforation. Mild wall thickening involving the terminal ileum best the on sequence 4, image 59. Tiny pockets of free air in the right lower quadrant best seen on sequence 4, image 56. Additional areas of small bowel thickening in the lower abdomen and pelvis. Small hiatal hernia. No evidence for bowel dilatation or obstruction.   Vascular/Lymphatic: Distal abdominal aorta measures 2.5 cm. Atherosclerotic calcifications in the aorta and iliac arteries. No lymph node enlargement in the abdomen or pelvis.   Reproductive: Status post hysterectomy. No adnexal masses.   Other: Small amount of free fluid in the pelvis and right lower quadrant. Trace fluid in the left paracolic gutter region.   Musculoskeletal: Grade 1 anterolisthesis of L4 on L5 secondary to facet arthropathy. No suspicious bone findings.   IMPRESSION: 1. Inflammatory changes in right lower quadrant centered around the appendix and terminal ileum. Findings are most compatible with a perforated appendicitis. Wall thickening involving the distal appendix with a small appendicolith and tiny pockets of gas in the right lower quadrant. There is also a small amount of free fluid in the abdomen and pelvis. 2. Scattered areas of small bowel wall thickening, particularly the terminal ileum. Suspect that the small bowel thickening is secondary to the appendicitis. 3. Aortic Atherosclerosis (ICD10-I70.0).   These results were called by telephone at the time of interpretation on 11/28/2021 at 8:27 am to provider [facility] , who verbally acknowledged these results.     Electronically Signed   By: Markus Daft M.D.   On: 11/28/2021 08:29  Assessment/Plan:  76 y.o. female with perforated  appendicitis, complicated by pertinent comorbidities including chronic kidney disease, hypertension.  Patient with history, physical exam and images consistent with acute appendicitis.  Due to the amount of ambulation around the right lower quadrant differential diagnosis could also be terminal ileitis but having an appendicolith and inflammation concentrating more around the appendix, appendicitis is most likely diagnosis.  Patient oriented about diagnosis and surgical management as treatment. Patient oriented about goals of surgery and its risk including: bowel injury, infection, abscess, bleeding, leak from cecum, intestinal adhesions, bowel obstruction, fistula, injury to the ureter among others.  Patient understood and agreed to proceed with surgery. Will admit patient, already started on antibiotic therapy, will give IV hydration since patient is NPO and schedule to OR.   Arnold Long, MD

## 2021-11-28 NOTE — ED Triage Notes (Addendum)
Pt here with lower abd pain that started Sun night. Pt states that she was having pain in the lower part of her abd that is constant. Pt also having dry heaves and nausea but denies diarrhea. Pt states she had a colonoscopy last week that was incomplete because she was not cleared out enough.

## 2021-11-28 NOTE — ED Notes (Signed)
Patient transported to CT 

## 2021-11-28 NOTE — ED Provider Notes (Signed)
[facility] Provider Note    Event Date/Time   First MD Initiated Contact with Patient 11/28/21 [phone]     (approximate)   History   Abdominal Pain   HPI  ALEGRIA DOMINIQUE is a 76 y.o. female with hypertension, hyperlipidemia, CKD who comes in with concern for abdominal pain.  Patient reports having some dry heaving 3 days ago.  She was not able to actually vomit.  And she started developing some abdominal pain yesterday.  She has had worsening pain today.  The pain is in the lower abdomen mostly, severe.  She denies taking anything for it.  She denies any diarrhea other sick contacts, dysuria.  Patient does report a history of her uterus being removed due to uterine cancer but that was years ago.  Denies any other abdominal issues previously.  She denies any chest pain or shortness of breath.  I reviewed a note from 11/21/2021 where patient had an attempted colonoscopy with GI.  That prep was inadequate and patient is planned to have a repeat colonoscopy in 2 weeks.      Physical Exam   Triage Vital Signs: ED Triage Vitals  Enc Vitals Group     BP 11/28/21 0711 93/64     Pulse Rate 11/28/21 0711 84     Resp 11/28/21 0711 18     Temp 11/28/21 0711 98.9 F (37.2 C)     Temp Source 11/28/21 0711 Oral     SpO2 11/28/21 0711 99 %     Weight 11/28/21 0710 169 lb (76.7 kg)     Height 11/28/21 0710 5\' 5"  (1.651 m)     Head Circumference --      Peak Flow --      Pain Score 11/28/21 0710 8     Pain Loc --      Pain Edu? --      Excl. in [place]? --     Most recent vital signs: Vitals:   11/28/21 0711  BP: 93/64  Pulse: 84  Resp: 18  Temp: 98.9 F (37.2 C)  SpO2: 99%     General: Awake, laying in bed with her eyes closed.  Appears uncomfortable CV:  Good peripheral perfusion.  Resp:  Normal effort.  Clear lungs Abd:  No distention.  Tender with rebound and gaurding to her lower abdomen Other:  No swelling in her legs   ED Results / Procedures /  Treatments   Labs (all labs ordered are listed, but only abnormal results are displayed) Labs Reviewed  COMPREHENSIVE METABOLIC PANEL - Abnormal; Notable for the following components:      Result Value   Glucose, Bld 135 (*)    Creatinine, Ser 1.44 (*)    Albumin 3.4 (*)    Alkaline Phosphatase 37 (*)    GFR, Estimated 38 (*)    All other components within normal limits  CBC - Abnormal; Notable for the following components:   RBC 3.83 (*)    HCT 35.5 (*)    Platelets 116 (*)    All other components within normal limits  RESP PANEL BY RT-PCR (FLU A&B, COVID) ARPGX2  LIPASE, BLOOD  LACTIC ACID, PLASMA  URINALYSIS, ROUTINE W REFLEX MICROSCOPIC  LACTIC ACID, PLASMA    RADIOLOGY I have reviewed the ct  personally and agree with radiology read with perforated appendicitis   PROCEDURES:  Critical Care performed: No  Procedures   MEDICATIONS ORDERED IN ED: Medications - No data to display  IMPRESSION / MDM / ASSESSMENT AND PLAN / ED COURSE  I reviewed the triage vital signs and the nursing notes.  Patient with a history of recent colonoscopy comes in with abdominal pain Differential diagnosis includes, but is not limited to, perforation, appendicitis, diverticulitis, abscess, mass or other acute pathology therefore patient will need to proceed with CT imaging to further evaluate.  Patient slightly hypotensive in triage.  Patient was given 1 L of fluid IV fentanyl and IV Zofran to help with symptoms.  CBC normal white count-plts chronically low Lactate normal  CMP-kidney function elevated but at baseline     8:25 AM discussed with radiology and patient has a perforated appendicitis.  We will start patient on Zosyn.  D/w Dr. Peyton Najjar he will admit patient but is requesting a consult to the hospitalist to help with her comorbidities.  I have placed this consult.      FINAL CLINICAL IMPRESSION(S) / ED DIAGNOSES   Final diagnoses:  Acute perforated appendicitis      Rx / DC Orders   ED Discharge Orders     None        Note:  This document was prepared using Dragon voice recognition software and may include unintentional dictation errors.   Vanessa Courtenay, MD 11/28/21 [phone]

## 2021-11-28 NOTE — Transfer of Care (Signed)
Immediate Anesthesia Transfer of Care Note  Patient: Morgan Livingston  Procedure(s) Performed: XI ROBOTIC LAPAROSCOPIC ASSISTED APPENDECTOMY  Patient Location: PACU  Anesthesia Type:General  Level of Consciousness: drowsy  Airway & Oxygen Therapy: Patient Spontanous Breathing and Patient connected to face mask oxygen  Post-op Assessment: Report given to RN and Post -op Vital signs reviewed and stable  Post vital signs: Reviewed and stable  Last Vitals:  Vitals Value Taken Time  BP 110/64 11/28/21 1339  Temp    Pulse 62 11/28/21 1340  Resp 16 11/28/21 1343  SpO2 100 % 11/28/21 1340  Vitals shown include unvalidated device data.  Last Pain:  Vitals:   11/28/21 1117  TempSrc: Oral  PainSc: 5          Complications: No notable events documented.

## 2021-11-28 NOTE — Anesthesia Preprocedure Evaluation (Addendum)
Anesthesia Evaluation  Patient identified by MRN, date of birth, ID band Patient awake    Reviewed: Allergy & Precautions, NPO status , Patient's Chart, lab work & pertinent test results  History of Anesthesia Complications Negative for: history of anesthetic complications  Airway Mallampati: IV   Neck ROM: Full    Dental  (+) Lower Dentures, Upper Dentures   Pulmonary former smoker (quit 1963),    Pulmonary exam normal breath sounds clear to auscultation       Cardiovascular hypertension, Normal cardiovascular exam Rhythm:Regular Rate:Normal     Neuro/Psych negative neurological ROS     GI/Hepatic Perforated appendicitis   Endo/Other  negative endocrine ROS  Renal/GU Renal disease (nephrolithiasis, stage III CKD)     Musculoskeletal   Abdominal   Peds  Hematology  (+) Blood dyscrasia (chronic thrombocytopenia), ,   Anesthesia Other Findings Reviewed 07/13/21 cardiology note.  Reproductive/Obstetrics Uterine CA                            Anesthesia Physical Anesthesia Plan  ASA: 2 and emergent  Anesthesia Plan: General   Post-op Pain Management:    Induction: Intravenous  PONV Risk Score and Plan: 3 and Ondansetron, Dexamethasone and Treatment may vary due to age or medical condition  Airway Management Planned: Oral ETT  Additional Equipment:   Intra-op Plan:   Post-operative Plan: Extubation in OR  Informed Consent: I have reviewed the patients History and Physical, chart, labs and discussed the procedure including the risks, benefits and alternatives for the proposed anesthesia with the patient or authorized representative who has indicated his/her understanding and acceptance.     Dental advisory given  Plan Discussed with: CRNA  Anesthesia Plan Comments: (Patient consented for risks of anesthesia including but not limited to:  - adverse reactions to medications -  damage to eyes, teeth, lips or other oral mucosa - nerve damage due to positioning  - sore throat or hoarseness - damage to heart, brain, nerves, lungs, other parts of body or loss of life  Informed patient about role of CRNA in peri- and intra-operative care.  Patient voiced understanding.)        Anesthesia Quick Evaluation

## 2021-11-29 ENCOUNTER — Encounter: Payer: Self-pay | Admitting: General Surgery

## 2021-11-29 ENCOUNTER — Encounter: Payer: Self-pay | Admitting: Oncology

## 2021-11-29 DIAGNOSIS — K3532 Acute appendicitis with perforation and localized peritonitis, without abscess: Secondary | ICD-10-CM | POA: Diagnosis not present

## 2021-11-29 DIAGNOSIS — I10 Essential (primary) hypertension: Secondary | ICD-10-CM | POA: Diagnosis not present

## 2021-11-29 LAB — BASIC METABOLIC PANEL
Anion gap: 6 (ref 5–15)
BUN: 28 mg/dL — ABNORMAL HIGH (ref 8–23)
CO2: 26 mmol/L (ref 22–32)
Calcium: 8.5 mg/dL — ABNORMAL LOW (ref 8.9–10.3)
Chloride: 104 mmol/L (ref 98–111)
Creatinine, Ser: 1.78 mg/dL — ABNORMAL HIGH (ref 0.44–1.00)
GFR, Estimated: 30 mL/min — ABNORMAL LOW (ref >60)
Glucose, Bld: 118 mg/dL — ABNORMAL HIGH (ref 70–99)
Potassium: 4.1 mmol/L (ref 3.5–5.1)
Sodium: 136 mmol/L (ref 135–145)

## 2021-11-29 LAB — CBC
HCT: 34.5 % — ABNORMAL LOW (ref 36.0–46.0)
Hemoglobin: 11.7 g/dL — ABNORMAL LOW (ref 12.0–15.0)
MCH: 32.2 pg (ref 26.0–34.0)
MCHC: 33.9 g/dL (ref 30.0–36.0)
MCV: 95 fL (ref 80.0–100.0)
Platelets: 100 10*3/uL — ABNORMAL LOW (ref 150–400)
RBC: 3.63 MIL/uL — ABNORMAL LOW (ref 3.87–5.11)
RDW: 13.8 % (ref 11.5–15.5)
WBC: 14 10*3/uL — ABNORMAL HIGH (ref 4.0–10.5)
nRBC: 0 % (ref 0.0–0.2)

## 2021-11-29 LAB — SURGICAL PATHOLOGY

## 2021-11-29 MED ORDER — ONDANSETRON HCL 4 MG PO TABS: 4.0000 mg | ORAL_TABLET | Freq: Every day | ORAL | 1 refills | Status: DC | PRN

## 2021-11-29 MED ORDER — HYDROCODONE-ACETAMINOPHEN 5-325 MG PO TABS: 1.0000 | ORAL_TABLET | ORAL | 0 refills | Status: AC | PRN

## 2021-11-29 NOTE — Discharge Summary (Signed)
Patient ID: Morgan Livingston MRN: [id number] DOB/AGE: 08-Sep-1946 76 y.o.  Admit date: 11/28/2021 Discharge date: 11/29/2021   Discharge Diagnoses:  Principal Problem:   Benign hypertension Active Problems:   DD (diverticular disease)   MGUS (monoclonal gammopathy of unknown significance)   Acute appendicitis with perforation and generalized peritonitis   Procedures: Robotic assisted laparoscopic appendectomy  Hospital Course: Patient admitted with perforated appendicitis.  She underwent robotic assisted laparoscopic appendectomy.  She tolerated the procedure well.  The appendix was perforated with right lower quadrant and pelvic purulent peritonitis.  The was able to be completely aspirated.  Today the patient without fever.  Vital signs are adequate.  Pain is controlled.  Patient tolerating diet.  Patient ambulating.  Incisions dry and clean.  Physical Exam HENT:     Head: Normocephalic.  Cardiovascular:     Rate and Rhythm: Normal rate and regular rhythm.  Pulmonary:     Effort: Pulmonary effort is normal.     Breath sounds: Normal breath sounds.  Abdominal:     General: Abdomen is flat. Bowel sounds are normal.     Palpations: Abdomen is soft.  Skin:    Capillary Refill: Capillary refill takes less than 2 seconds.  Neurological:     General: No focal deficit present.     Mental Status: She is alert and oriented to person, place, and time.     Consults: Hospitalist  Disposition: Discharge disposition: 01-Home or Self Care       Discharge Instructions     Diet - low sodium heart healthy   Complete by: As directed    Increase activity slowly   Complete by: As directed       Allergies as of 11/29/2021   No Known Allergies      Medication List     TAKE these medications    acetaminophen 500 MG tablet Commonly known as: TYLENOL Take 2 tablets (1,000 mg total) by mouth every 6 (six) hours as needed for mild pain or headache.   aspirin 81 MG  tablet Take 81 mg by mouth daily.   Biotin 5 MG Caps Take by mouth daily.   carboxymethylcellulose 0.5 % Soln Commonly known as: REFRESH PLUS Place 1 drop into both eyes daily.   cholecalciferol 1000 units tablet Commonly known as: VITAMIN D Take 1,000 Units by mouth daily.   ezetimibe 10 MG tablet Commonly known as: Zetia Take 1 tablet (10 mg total) by mouth daily.   ferrous sulfate 324 MG Tbec Take 65 mg by mouth daily with breakfast.   fexofenadine 180 MG tablet Commonly known as: ALLEGRA Take 180 mg by mouth daily.   HYDROcodone-acetaminophen 5-325 MG tablet Commonly known as: Norco Take 1 tablet by mouth every 4 (four) hours as needed for up to 3 days for moderate pain.   losartan-hydrochlorothiazide 50-12.5 MG tablet Commonly known as: HYZAAR Take 1 tablet by mouth daily.   ondansetron 4 MG tablet Commonly known as: Zofran Take 1 tablet (4 mg total) by mouth daily as needed for nausea or vomiting.   rosuvastatin 20 MG tablet Commonly known as: CRESTOR Take 1 tablet (20 mg total) by mouth at bedtime.   vitamin B-12 1000 MCG tablet Commonly known as: CYANOCOBALAMIN Take 1 tablet (1,000 mcg total) by mouth daily. What changed:  when to take this additional instructions   vitamin E 1000 UNIT capsule Take 1,000 Units by mouth daily.        Follow-up Information     Herbert Pun,  MD Follow up in 2 week(s).   Specialty: General Surgery Why: Follow up after appendectomy Contact information: [address] [address] [phone]

## 2021-11-29 NOTE — Progress Notes (Signed)
PROGRESS NOTE    Morgan Livingston  UYQ:[id number] DOB: 21-Mar-1946 DOA: 11/28/2021 PCP: Steele Sizer, MD    Assessment & Plan:   Principal Problem:   Benign hypertension Active Problems:   DD (diverticular disease)   MGUS (monoclonal gammopathy of unknown significance)   Acute appendicitis with perforation and generalized peritonitis   HTN: continue to hold losartan, HCTZ as BP is WNL  CKDIIIa: Cr is labile. Avoid nephrotoxic meds   HLD: continue to hold statin while pt is NPO  Perforated appendicitis: continue on IV zosyn as per general surg. Norco, morphine prn pain   Leukocytosis: likely secondary to appendicitis. Continue on IV abxs   Thrombocytopenia: etiology unclear. Will continue to monitor   DVT prophylaxis: lovenox  Code Status: full  Family Communication: discussed pt's care w/ pt's husband at bedside and answered his questions  Disposition Plan: as per general surg recs   Level of care: Med-Surg  Status is: Observation  The patient remains OBS appropriate and will d/c before 2 midnights.   Consultants:    Procedures:  Antimicrobials: zosyn    Subjective: Pt c/o malaise   Objective: Vitals:   11/28/21 1629 11/28/21 2034 11/29/21 0329 11/29/21 0739  BP: 109/62 (!) 100/56 96/60 100/74  Pulse: 64 70 63 65  Resp: 18 16 18 18   Temp: (!) 97.3 F (36.3 C) 98.3 F (36.8 C) 97.9 F (36.6 C) 98.2 F (36.8 C)  TempSrc:  Oral  Oral  SpO2: 96% 97% 97% 99%  Weight:      Height:        Intake/Output Summary (Last 24 hours) at 11/29/2021 1316 Last data filed at 11/29/2021 1038 Gross per 24 hour  Intake 1507.91 ml  Output --  Net 1507.91 ml   Filed Weights   11/28/21 0710 11/28/21 1117  Weight: 76.7 kg 75.8 kg    Examination:  General exam: Appears calm and comfortable  Respiratory system: Clear to auscultation. Respiratory effort normal. Cardiovascular system: S1 & S2+. No rubs, gallops or clicks.  Gastrointestinal system: Abdomen is  nondistended, soft and nontender. Hypoactive bowel sounds heard. Central nervous system: Alert and oriented. Moves all extremities  Psychiatry: Judgement and insight appear normal. Mood & affect appropriate.     Data Reviewed: I have personally reviewed following labs and imaging studies  CBC: Recent Labs  Lab 11/28/21 0713 11/29/21 0829  WBC 6.2 14.0*  HGB 12.5 11.7*  HCT 35.5* 34.5*  MCV 92.7 95.0  PLT 116* 638*   Basic Metabolic Panel: Recent Labs  Lab 11/28/21 0713 11/29/21 0829  NA 135 136  K 3.5 4.1  CL 102 104  CO2 23 26  GLUCOSE 135* 118*  BUN 22 28*  CREATININE 1.44* 1.78*  CALCIUM 9.3 8.5*   GFR: Estimated Creatinine Clearance: 28.2 mL/min (A) (by C-G formula based on SCr of 1.78 mg/dL (H)). Liver Function Tests: Recent Labs  Lab 11/28/21 0713  AST 21  ALT 13  ALKPHOS 37*  BILITOT 0.7  PROT 7.1  ALBUMIN 3.4*   Recent Labs  Lab 11/28/21 0713  LIPASE 36   No results for input(s): AMMONIA in the last 168 hours. Coagulation Profile: No results for input(s): INR, PROTIME in the last 168 hours. Cardiac Enzymes: No results for input(s): CKTOTAL, CKMB, CKMBINDEX, TROPONINI in the last 168 hours. BNP (last 3 results) No results for input(s): PROBNP in the last 8760 hours. HbA1C: No results for input(s): HGBA1C in the last 72 hours. CBG: No results for input(s): GLUCAP in  the last 168 hours. Lipid Profile: No results for input(s): CHOL, HDL, LDLCALC, TRIG, CHOLHDL, LDLDIRECT in the last 72 hours. Thyroid Function Tests: No results for input(s): TSH, T4TOTAL, FREET4, T3FREE, THYROIDAB in the last 72 hours. Anemia Panel: No results for input(s): VITAMINB12, FOLATE, FERRITIN, TIBC, IRON, RETICCTPCT in the last 72 hours. Sepsis Labs: Recent Labs  Lab 11/28/21 0741 11/28/21 1940  LATICACIDVEN 1.6 2.0*    Recent Results (from the past 240 hour(s))  Resp Panel by RT-PCR (Flu A&B, Covid) Nasopharyngeal Swab     Status: None   Collection Time:  11/28/21  8:13 AM   Specimen: Nasopharyngeal Swab; Nasopharyngeal(NP) swabs in vial transport medium  Result Value Ref Range Status   SARS Coronavirus 2 by RT PCR NEGATIVE NEGATIVE Final    Comment: (NOTE) SARS-CoV-2 target nucleic acids are NOT DETECTED.  The SARS-CoV-2 RNA is generally detectable in upper respiratory specimens during the acute phase of infection. The lowest concentration of SARS-CoV-2 viral copies this assay can detect is 138 copies/mL. A negative result does not preclude SARS-Cov-2 infection and should not be used as the sole basis for treatment or other patient management decisions. A negative result may occur with  improper specimen collection/handling, submission of specimen other than nasopharyngeal swab, presence of viral mutation(s) within the areas targeted by this assay, and inadequate number of viral copies(<138 copies/mL). A negative result must be combined with clinical observations, patient history, and epidemiological information. The expected result is Negative.  Fact Sheet for Patients:  EntrepreneurPulse.com.au  Fact Sheet for Healthcare Providers:  IncredibleEmployment.be  This test is no t yet approved or cleared by the [place] FDA and  has been authorized for detection and/or diagnosis of SARS-CoV-2 by FDA under an Emergency Use Authorization (EUA). This EUA will remain  in effect (meaning this test can be used) for the duration of the COVID-19 declaration under Section 564(b)(1) of the Act, 21 U.S.C.section 360bbb-3(b)(1), unless the authorization is terminated  or revoked sooner.       Influenza A by PCR NEGATIVE NEGATIVE Final   Influenza B by PCR NEGATIVE NEGATIVE Final    Comment: (NOTE) The Xpert Xpress SARS-CoV-2/FLU/RSV plus assay is intended as an aid in the diagnosis of influenza from Nasopharyngeal swab specimens and should not be used as a sole basis for treatment. Nasal washings  and aspirates are unacceptable for Xpert Xpress SARS-CoV-2/FLU/RSV testing.  Fact Sheet for Patients: EntrepreneurPulse.com.au  Fact Sheet for Healthcare Providers: IncredibleEmployment.be  This test is not yet approved or cleared by the [place] FDA and has been authorized for detection and/or diagnosis of SARS-CoV-2 by FDA under an Emergency Use Authorization (EUA). This EUA will remain in effect (meaning this test can be used) for the duration of the COVID-19 declaration under Section 564(b)(1) of the Act, 21 U.S.C. section 360bbb-3(b)(1), unless the authorization is terminated or revoked.  Performed at [facility], [address], [place],  [address]          Radiology Studies: CT ABDOMEN PELVIS W CONTRAST  Result Date: 11/28/2021 CLINICAL DATA:  Abdominal pain, acute, nonlocalized. EXAM: CT ABDOMEN AND PELVIS WITH CONTRAST TECHNIQUE: Multidetector CT imaging of the abdomen and pelvis was performed using the standard protocol following bolus administration of intravenous contrast. RADIATION DOSE REDUCTION: This exam was performed according to the departmental dose-optimization program which includes automated exposure control, adjustment of the mA and/or kV according to patient size and/or use of iterative reconstruction technique. CONTRAST:  142mL OMNIPAQUE IOHEXOL 300 MG/ML  SOLN COMPARISON:  09/30/2018 FINDINGS: Lower chest: Lung bases are clear. Hepatobiliary: Normal appearance of the liver, gallbladder and portal venous system. Pancreas: Unremarkable. No pancreatic ductal dilatation or surrounding inflammatory changes. Spleen: Normal in size without focal abnormality. Adrenals/Urinary Tract: Normal adrenal glands. Mild dilatation of the renal pelvis in both kidneys appears chronic. No hydronephrosis. No suspicious renal lesions. Normal appearance of the urinary bladder. Stomach/Bowel: Prominent diverticula in the sigmoid  colon. Inflammatory changes in the right lower quadrant centered around the appendix and terminal ileum. Inflammatory changes around the appendix are best seen on the sagittal images, sequence 3 image 54 with a small appendicolith and wall thickening near the tip of the appendix. Findings are suggestive for acute appendicitis with perforation. Mild wall thickening involving the terminal ileum best the on sequence 4, image 59. Tiny pockets of free air in the right lower quadrant best seen on sequence 4, image 56. Additional areas of small bowel thickening in the lower abdomen and pelvis. Small hiatal hernia. No evidence for bowel dilatation or obstruction. Vascular/Lymphatic: Distal abdominal aorta measures 2.5 cm. Atherosclerotic calcifications in the aorta and iliac arteries. No lymph node enlargement in the abdomen or pelvis. Reproductive: Status post hysterectomy. No adnexal masses. Other: Small amount of free fluid in the pelvis and right lower quadrant. Trace fluid in the left paracolic gutter region. Musculoskeletal: Grade 1 anterolisthesis of L4 on L5 secondary to facet arthropathy. No suspicious bone findings. IMPRESSION: 1. Inflammatory changes in right lower quadrant centered around the appendix and terminal ileum. Findings are most compatible with a perforated appendicitis. Wall thickening involving the distal appendix with a small appendicolith and tiny pockets of gas in the right lower quadrant. There is also a small amount of free fluid in the abdomen and pelvis. 2. Scattered areas of small bowel wall thickening, particularly the terminal ileum. Suspect that the small bowel thickening is secondary to the appendicitis. 3. Aortic Atherosclerosis (ICD10-I70.0). These results were called by telephone at the time of interpretation on 11/28/2021 at 8:27 am to provider [facility] , who verbally acknowledged these results. Electronically Signed   By: Markus Daft M.D.   On: 11/28/2021 08:29        Scheduled  Meds:  enoxaparin (LOVENOX) injection  40 mg Subcutaneous Q24H   losartan  50 mg Oral Daily   And   hydrochlorothiazide  12.5 mg Oral Daily   pantoprazole (PROTONIX) IV  40 mg Intravenous QHS   Continuous Infusions:  sodium chloride 125 mL/hr at 11/29/21 0434   piperacillin-tazobactam (ZOSYN)  IV 3.375 g (11/29/21 0711)     LOS: 0 days    Time spent: 32 mins     Wyvonnia Dusky, MD [facility] Pager [phone]  If 7PM-7AM, please contact night-coverage 11/29/2021, 1:16 PM

## 2021-11-29 NOTE — Discharge Instructions (Signed)

## 2021-11-29 NOTE — TOC Initial Note (Signed)
Transition of Care [facility]) - Initial/Assessment Note    Patient Details  Name: Morgan Livingston MRN: [id number] Date of Birth: 11/05/1946  Transition of Care [facility]) CM/SW Contact:    Beverly Sessions, RN Phone Number: 11/29/2021, 1:33 PM  Clinical Narrative:                  Transition of Care [facility]) Screening Note   Patient Details  Name: Morgan Livingston Date of Birth: 12-21-1945   Transition of Care [facility]) CM/SW Contact:    Beverly Sessions, RN Phone Number: 11/29/2021, 1:33 PM    Transition of Care [facility]) has reviewed patient and no TOC needs have been identified at this time. We will continue to monitor patient advancement through interdisciplinary progression rounds. If new patient transition needs arise, please place a TOC consult.          Patient Goals and CMS Choice        Expected Discharge Plan and Services           Expected Discharge Date: 11/29/21                                    Prior Living Arrangements/Services                       Activities of Daily Living Home Assistive Devices/Equipment: None ADL Screening (condition at time of admission) Patient's cognitive ability adequate to safely complete daily activities?: Yes Is the patient deaf or have difficulty hearing?: No Does the patient have difficulty seeing, even when wearing glasses/contacts?: No Does the patient have difficulty concentrating, remembering, or making decisions?: No Patient able to express need for assistance with ADLs?: Yes Does the patient have difficulty dressing or bathing?: No Independently performs ADLs?: Yes (appropriate for developmental age) Does the patient have difficulty walking or climbing stairs?: No Weakness of Legs: None Weakness of Arms/Hands: None  Permission Sought/Granted                  Emotional Assessment              Admission diagnosis:  Acute perforated appendicitis [K35.32] Acute appendicitis with  perforation and generalized peritonitis [K35.20] Patient Active Problem List   Diagnosis Date Noted   Acute appendicitis with perforation and generalized peritonitis 11/28/2021   History of IBS 06/03/2020   Mass of arm, left    MGUS (monoclonal gammopathy of unknown significance) 09/01/2018   Secondary hyperparathyroidism ([place]) 09/01/2018   B12 deficiency 08/19/2018   Atherosclerosis of aorta ([place]) 07/25/2018   Thrombocytopenia ([place]) 05/09/2018   Proteinuria 05/09/2018   Positive ANA (antinuclear antibody) 04/02/2018   Bilateral dry eyes 04/02/2018   History of hysterectomy 05/23/2015   Chronic kidney disease, stage III (moderate) ([place]) 05/23/2015   Benign hypertension 05/22/2015   Chronic constipation 05/22/2015   DDD (degenerative disc disease), lumbar 05/22/2015   DD (diverticular disease) 05/22/2015   Dyslipidemia 05/22/2015   History of cervical cancer 80/32/1224   Helicobacter pylori gastrointestinal tract infection 05/22/2015   Blood glucose elevated 05/22/2015   Overweight (BMI 25.0-29.9) 05/22/2015   Perennial allergic rhinitis with seasonal variation 05/22/2015   Vitamin D deficiency 05/22/2015   Impingement syndrome of shoulder 01/14/2015   PCP:  Steele Sizer, MD Pharmacy:   [id number] - Closed - [place], [place] - [address] [place] Phone: [phone] Fax: [phone]  [id number] - [place], [place] - [address] [place] [address] Phone: [phone] Fax: [phone]     Social Determinants of Health (SDOH) Interventions    Readmission Risk Interventions No flowsheet data found.

## 2021-11-29 NOTE — Anesthesia Postprocedure Evaluation (Signed)
Anesthesia Post Note  Patient: Morgan Livingston  Procedure(s) Performed: XI ROBOTIC LAPAROSCOPIC ASSISTED APPENDECTOMY  Patient location during evaluation: PACU Anesthesia Type: General Level of consciousness: awake and alert Pain management: pain level controlled Vital Signs Assessment: post-procedure vital signs reviewed and stable Respiratory status: spontaneous breathing, nonlabored ventilation, respiratory function stable and patient connected to nasal cannula oxygen Cardiovascular status: blood pressure returned to baseline and stable Postop Assessment: no apparent nausea or vomiting Anesthetic complications: no   No notable events documented.   Last Vitals:  Vitals:   11/29/21 0329 11/29/21 0739  BP: 96/60 100/74  Pulse: 63 65  Resp: 18 18  Temp: 36.6 C 36.8 C  SpO2: 97% 99%    Last Pain:  Vitals:   11/29/21 0739  TempSrc: Oral  PainSc:                  Precious Haws Elinda Bunten

## 2021-11-29 NOTE — Care Management Obs Status (Signed)
[facility] NOTIFICATION   Patient Details  Name: Morgan Livingston MRN: [id number] Date of Birth: 04-16-1946   Medicare Observation Status Notification Given:  Yes    Beverly Sessions, RN 11/29/2021, 1:33 PM

## 2021-12-20 ENCOUNTER — Other Ambulatory Visit: Payer: Self-pay

## 2021-12-20 ENCOUNTER — Ambulatory Visit
Admission: RE | Admit: 2021-12-20 | Discharge: 2021-12-20 | Disposition: A | Discharge status: Home / Self Care | Payer: Medicare PPO | Source: Ambulatory Visit | Attending: Family Medicine | Admitting: Family Medicine

## 2021-12-20 DIAGNOSIS — Z1231 Encounter for screening mammogram for malignant neoplasm of breast: Secondary | ICD-10-CM | POA: Insufficient documentation

## 2022-01-02 ENCOUNTER — Ambulatory Visit (INDEPENDENT_AMBULATORY_CARE_PROVIDER_SITE_OTHER): Payer: Medicare PPO

## 2022-01-02 ENCOUNTER — Other Ambulatory Visit: Payer: Self-pay

## 2022-01-02 VITALS — BP 136/82 | HR 56 | Temp 97.9°F | Resp 16 | Ht 65.0 in | Wt 163.3 lb

## 2022-01-02 DIAGNOSIS — Z0000 Encounter for general adult medical examination without abnormal findings: Secondary | ICD-10-CM | POA: Diagnosis not present

## 2022-01-02 NOTE — Patient Instructions (Signed)
Morgan Livingston , Thank you for taking time to come for your Medicare Wellness Visit. I appreciate your ongoing commitment to your health goals. Please review the following plan we discussed and let me know if I can assist you in the future.   Screening recommendations/referrals: Colonoscopy: done 11/21/21. Please contact [facility] to schedule your repeat screening colonoscopy at [phone].  Mammogram: done 12/20/21 Bone Density: done 01/11/20 Recommended yearly ophthalmology/optometry visit for glaucoma screening and checkup Recommended yearly dental visit for hygiene and checkup  Vaccinations: Influenza vaccine: done 08/09/21 Pneumococcal vaccine: done 12/28/14 Tdap vaccine: done 05/14/17 Shingles vaccine: done 11/02/21; due for second dose   Covid-19:done 12/16/19, 01/13/20, 09/05/20 & 11/23/21  Advanced directives: Please bring a copy of your health care power of attorney and living will to the office at your convenience once you have completed that paperwork.   Conditions/risks identified: Keep up the great work!  Next appointment: Follow up in one year for your annual wellness visit    Preventive Care 65 Years and Older, Female Preventive care refers to lifestyle choices and visits with your health care provider that can promote health and wellness. What does preventive care include? A yearly physical exam. This is also called an annual well check. Dental exams once or twice a year. Routine eye exams. Ask your health care provider how often you should have your eyes checked. Personal lifestyle choices, including: Daily care of your teeth and gums. Regular physical activity. Eating a healthy diet. Avoiding tobacco and drug use. Limiting alcohol use. Practicing safe sex. Taking low-dose aspirin every day. Taking vitamin and mineral supplements as recommended by your health care provider. What happens during an annual well check? The services and screenings done by your  health care provider during your annual well check will depend on your age, overall health, lifestyle risk factors, and family history of disease. Counseling  Your health care provider may ask you questions about your: Alcohol use. Tobacco use. Drug use. Emotional well-being. Home and relationship well-being. Sexual activity. Eating habits. History of falls. Memory and ability to understand (cognition). Work and work Statistician. Reproductive health. Screening  You may have the following tests or measurements: Height, weight, and BMI. Blood pressure. Lipid and cholesterol levels. These may be checked every 5 years, or more frequently if you are over 87 years old. Skin check. Lung cancer screening. You may have this screening every year starting at age 83 if you have a 30-pack-year history of smoking and currently smoke or have quit within the past 15 years. Fecal occult blood test (FOBT) of the stool. You may have this test every year starting at age 14. Flexible sigmoidoscopy or colonoscopy. You may have a sigmoidoscopy every 5 years or a colonoscopy every 10 years starting at age 81. Hepatitis C blood test. Hepatitis B blood test. Sexually transmitted disease (STD) testing. Diabetes screening. This is done by checking your blood sugar (glucose) after you have not eaten for a while (fasting). You may have this done every 1-3 years. Bone density scan. This is done to screen for osteoporosis. You may have this done starting at age 81. Mammogram. This may be done every 1-2 years. Talk to your health care provider about how often you should have regular mammograms. Talk with your health care provider about your test results, treatment options, and if necessary, the need for more tests. Vaccines  Your health care provider may recommend certain vaccines, such as: Influenza vaccine. This is recommended every year. Tetanus, diphtheria,  and acellular pertussis (Tdap, Td) vaccine. You may  need a Td booster every 10 years. Zoster vaccine. You may need this after age 88. Pneumococcal 13-valent conjugate (PCV13) vaccine. One dose is recommended after age 39. Pneumococcal polysaccharide (PPSV23) vaccine. One dose is recommended after age 79. Talk to your health care provider about which screenings and vaccines you need and how often you need them. This information is not intended to replace advice given to you by your health care provider. Make sure you discuss any questions you have with your health care provider. Document Released: 11/18/2015 Document Revised: 07/11/2016 Document Reviewed: 08/23/2015 Elsevier Interactive Patient Education  2017 [place] Prevention in the Home Falls can cause injuries. They can happen to people of all ages. There are many things you can do to make your home safe and to help prevent falls. What can I do on the outside of my home? Regularly fix the edges of walkways and driveways and fix any cracks. Remove anything that might make you trip as you walk through a door, such as a raised step or threshold. Trim any bushes or trees on the path to your home. Use bright outdoor lighting. Clear any walking paths of anything that might make someone trip, such as rocks or tools. Regularly check to see if handrails are loose or broken. Make sure that both sides of any steps have handrails. Any raised decks and porches should have guardrails on the edges. Have any leaves, snow, or ice cleared regularly. Use sand or salt on walking paths during winter. Clean up any spills in your garage right away. This includes oil or grease spills. What can I do in the bathroom? Use night lights. Install grab bars by the toilet and in the tub and shower. Do not use towel bars as grab bars. Use non-skid mats or decals in the tub or shower. If you need to sit down in the shower, use a plastic, non-slip stool. Keep the floor dry. Clean up any water that spills on  the floor as soon as it happens. Remove soap buildup in the tub or shower regularly. Attach bath mats securely with double-sided non-slip rug tape. Do not have throw rugs and other things on the floor that can make you trip. What can I do in the bedroom? Use night lights. Make sure that you have a light by your bed that is easy to reach. Do not use any sheets or blankets that are too big for your bed. They should not hang down onto the floor. Have a firm chair that has side arms. You can use this for support while you get dressed. Do not have throw rugs and other things on the floor that can make you trip. What can I do in the kitchen? Clean up any spills right away. Avoid walking on wet floors. Keep items that you use a lot in easy-to-reach places. If you need to reach something above you, use a strong step stool that has a grab bar. Keep electrical cords out of the way. Do not use floor polish or wax that makes floors slippery. If you must use wax, use non-skid floor wax. Do not have throw rugs and other things on the floor that can make you trip. What can I do with my stairs? Do not leave any items on the stairs. Make sure that there are handrails on both sides of the stairs and use them. Fix handrails that are broken or loose. Make sure  that handrails are as long as the stairways. Check any carpeting to make sure that it is firmly attached to the stairs. Fix any carpet that is loose or worn. Avoid having throw rugs at the top or bottom of the stairs. If you do have throw rugs, attach them to the floor with carpet tape. Make sure that you have a light switch at the top of the stairs and the bottom of the stairs. If you do not have them, ask someone to add them for you. What else can I do to help prevent falls? Wear shoes that: Do not have high heels. Have rubber bottoms. Are comfortable and fit you well. Are closed at the toe. Do not wear sandals. If you use a stepladder: Make sure  that it is fully opened. Do not climb a closed stepladder. Make sure that both sides of the stepladder are locked into place. Ask someone to hold it for you, if possible. Clearly mark and make sure that you can see: Any grab bars or handrails. First and last steps. Where the edge of each step is. Use tools that help you move around (mobility aids) if they are needed. These include: Canes. Walkers. Scooters. Crutches. Turn on the lights when you go into a dark area. Replace any light bulbs as soon as they burn out. Set up your furniture so you have a clear path. Avoid moving your furniture around. If any of your floors are uneven, fix them. If there are any pets around you, be aware of where they are. Review your medicines with your doctor. Some medicines can make you feel dizzy. This can increase your chance of falling. Ask your doctor what other things that you can do to help prevent falls. This information is not intended to replace advice given to you by your health care provider. Make sure you discuss any questions you have with your health care provider. Document Released: 08/18/2009 Document Revised: 03/29/2016 Document Reviewed: 11/26/2014 Elsevier Interactive Patient Education  2017 [employer].

## 2022-01-02 NOTE — Progress Notes (Signed)
Subjective:   Morgan Livingston is a 76 y.o. female who presents for Medicare Annual (Subsequent) preventive examination.  Review of Systems     Cardiac Risk Factors include: advanced age (>32men, >34 women);dyslipidemia;hypertension     Objective:    Today's Vitals   01/02/22 0928  BP: 136/82  Pulse: (!) 56  Resp: 16  Temp: 97.9 F (36.6 C)  TempSrc: Oral  SpO2: 99%  Weight: 163 lb 4.8 oz (74.1 kg)  Height: 5\' 5"  (1.651 m)   Body mass index is 27.17 kg/m.  Advanced Directives 01/02/2022 11/28/2021 11/28/2021 11/28/2021 11/21/2021 07/31/2021 12/29/2020  Does Patient Have a Medical Advance Directive? No No No No No No No  Would patient like information on creating a medical advance directive? No - Patient declined No - Patient declined No - Patient declined No - Patient declined - No - Patient declined Yes (MAU/Ambulatory/Procedural Areas - Information given)    Current Medications (verified) Outpatient Encounter Medications as of 01/02/2022  Medication Sig   acetaminophen (TYLENOL) 500 MG tablet Take 2 tablets (1,000 mg total) by mouth every 6 (six) hours as needed for mild pain or headache.   aspirin 81 MG tablet Take 81 mg by mouth daily.   Biotin 5 MG CAPS Take by mouth daily.   carboxymethylcellulose (REFRESH PLUS) 0.5 % SOLN Place 1 drop into both eyes daily.   cholecalciferol (VITAMIN D) 1000 UNITS tablet Take 1,000 Units by mouth daily.    ezetimibe (ZETIA) 10 MG tablet Take 1 tablet (10 mg total) by mouth daily.   ferrous sulfate 324 MG TBEC Take 65 mg by mouth daily with breakfast.   fexofenadine (ALLEGRA) 180 MG tablet Take 180 mg by mouth daily.   losartan-hydrochlorothiazide (HYZAAR) 50-12.5 MG tablet Take 1 tablet by mouth daily.   rosuvastatin (CRESTOR) 20 MG tablet Take 1 tablet (20 mg total) by mouth at bedtime.   vitamin B-12 (CYANOCOBALAMIN) 1000 MCG tablet Take 1 tablet (1,000 mcg total) by mouth daily. (Patient taking differently: Take 1,000 mcg by mouth once a  week. Wednesday)   vitamin E 1000 UNIT capsule Take 1,000 Units by mouth daily.   [DISCONTINUED] ondansetron (ZOFRAN) 4 MG tablet Take 1 tablet (4 mg total) by mouth daily as needed for nausea or vomiting.   No facility-administered encounter medications on file as of 01/02/2022.    Allergies (verified) Patient has no known allergies.   History: Past Medical History:  Diagnosis Date   Allergy    Cancer ([place])    Uterine Cancer   Hyperlipidemia    Hypertension    Kidney stones 06/21/2018   Past Surgical History:  Procedure Laterality Date   ABDOMINAL HYSTERECTOMY     BREAST BIOPSY Right 04/09/2006   negative   BREAST BIOPSY Left 12/04/2017   fibroadenmatoid change with coarse calcs   COLONOSCOPY WITH PROPOFOL N/A 10/07/2018   Procedure: COLONOSCOPY WITH PROPOFOL;  Surgeon: [name], MD;  Location[facility] ENDOSCOPY;  Service: Gastroenterology;  Laterality: N/A;   COLONOSCOPY WITH PROPOFOL N/A 11/21/2021   Procedure: COLONOSCOPY WITH PROPOFOL;  Surgeon: [name], MD;  Location: [facility] ENDOSCOPY;  Service: Gastroenterology;  Laterality: N/A;   LIPOMA EXCISION Left 04/15/2020   Procedure: EXCISION LIPOMA, left arm;  Surgeon: Fredirick Maudlin, MD;  Location: [facility] ORS;  Service: General;  Laterality: Left;   XI ROBOTIC LAPAROSCOPIC ASSISTED APPENDECTOMY N/A 11/28/2021   Procedure: XI ROBOTIC LAPAROSCOPIC ASSISTED APPENDECTOMY;  Surgeon: Herbert Pun, MD;  Location: [facility] ORS;  Service: General;  Laterality: N/A;  Family History  Problem Relation Age of Onset   Congestive Heart Failure Mother 62   Heart attack Father 59   Sinusitis Sister    Stomach cancer Sister    Uterine cancer Sister 38   Liver cancer Sister    Pancreatic cancer Sister    Dementia Sister        37   Breast cancer Neg Hx    Social History   Socioeconomic History   Marital status: Married    Spouse name: Junior   Number of children: 2   Years of education: some college   Highest education level:  12th grade  Occupational History   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   Tobacco Use   Smoking status: Former    Packs/day: 1.00    Years: 2.00    Pack years: 2.00    Types: Cigarettes    Quit date: 1963    Years since quitting: 60.2   Smokeless tobacco: Never   Tobacco comments:    smoking cessation materials not required  Vaping Use   Vaping Use: Never used  Substance and Sexual Activity   Alcohol use: No    Alcohol/week: 0.0 standard drinks   Drug use: No   Sexual activity: Yes    Partners: Male    Birth control/protection: Post-menopausal  Other Topics Concern   Not on file  Social History Narrative   Married for 50 years    Social Determinants of Health   Financial Resource Strain: Low Risk    Difficulty of Paying Living Expenses: Not hard at all  Food Insecurity: No Food Insecurity   Worried About Charity fundraiser in the Last Year: Never true   Arboriculturist in the Last Year: Never true  Transportation Needs: No Transportation Needs   Lack of Transportation (Medical): No   Lack of Transportation (Non-Medical): No  Physical Activity: Insufficiently Active   Days of Exercise per Week: 7 days   Minutes of Exercise per Session: 20 min  Stress: No Stress Concern Present   Feeling of Stress : Not at all  Social Connections: Socially Integrated   Frequency of Communication with Friends and Family: More than three times a week   Frequency of Social Gatherings with Friends and Family: More than three times a week   Attends Religious Services: More than 4 times per year   Active Member of [employer] or Organizations: Yes   Attends Music therapist: More than 4 times per year   Marital Status: Married    Tobacco Counseling Counseling given: Not Answered Tobacco comments: smoking cessation materials not required   Clinical Intake:  Pre-visit preparation completed: Yes  Pain : No/denies pain     BMI - recorded:  27.17 Nutritional Status: BMI 25 -29 Overweight Nutritional Risks: None Diabetes: No  How often do you need to have someone help you when you read instructions, pamphlets, or other written materials from your doctor or pharmacy?: 1 - Never    Interpreter Needed?: No  Information entered by :: Clemetine Marker LPN   Activities of Daily Living In your present state of health, do you have any difficulty performing the following activities: 01/02/2022 11/28/2021  Hearing? N N  Vision? N N  Difficulty concentrating or making decisions? N N  Walking or climbing stairs? N N  Dressing or bathing? N N  Doing errands, shopping? N N  Preparing Food and eating ? N -  Using the  Toilet? N -  In the past six months, have you accidently leaked urine? N -  Do you have problems with loss of bowel control? N -  Managing your Medications? N -  Managing your Finances? N -  Housekeeping or managing your Housekeeping? N -  Some recent data might be hidden    Patient Care Team: Steele Sizer, MD as PCP - General (Family Medicine) Earlie Server, MD as Consulting Physician (Oncology) Anthonette Legato, MD (Nephrology) Yolonda Kida, MD as Consulting Physician (Cardiology) [name], MD as Consulting Physician (Gastroenterology)  Indicate any recent Medical Services you may have received from other than [facility] providers in the past year (date may be approximate).     Assessment:   This is a routine wellness examination for [place] [place].  Hearing/Vision screen Hearing Screening - Comments:: Pt denies hearing difficulty Vision Screening - Comments:: Annual vision screenings done at [place] issues and exercise activities discussed: Current Exercise Habits: Home exercise routine, Type of exercise: walking, Time (Minutes): 20, Frequency (Times/Week): 7, Weekly Exercise (Minutes/Week): 140, Intensity: Mild, Exercise limited by: None identified   Goals Addressed   None    Depression  Screen PHQ 2/9 Scores 01/02/2022 10/18/2021 04/18/2021 12/29/2020 10/11/2020 07/14/2020 06/22/2020  PHQ - 2 Score 0 0 0 0 0 0 0  PHQ- 9 Score - - - - 0 - 4    Fall Risk Fall Risk  01/02/2022 10/18/2021 04/18/2021 12/29/2020 10/11/2020  Falls in the past year? 0 0 0 0 0  Number falls in past yr: 0 - 0 0 0  Injury with Fall? 0 - 0 0 0  Risk for fall due to : No Fall Risks - - No Fall Risks -  Follow up Falls prevention discussed Falls prevention discussed - Falls prevention discussed -    FALL RISK PREVENTION PERTAINING TO THE HOME:  Any stairs in or around the home? No  If so, are there any without handrails? No  Home free of loose throw rugs in walkways, pet beds, electrical cords, etc? Yes  Adequate lighting in your home to reduce risk of falls? Yes   ASSISTIVE DEVICES UTILIZED TO PREVENT FALLS:  Life alert? No  Use of a cane, walker or w/c? No  Grab bars in the bathroom? No  Shower chair or bench in shower? No  Elevated toilet seat or a handicapped toilet? No   TIMED UP AND GO:  Was the test performed? Yes .  Length of time to ambulate 10 feet: 5 sec.   Gait steady and fast without use of assistive device  Cognitive Function:     6CIT Screen 12/29/2019 12/23/2018 12/20/2017  What Year? 0 points 0 points 0 points  What month? 0 points 0 points 0 points  What time? 0 points 0 points 0 points  Count back from 20 0 points 0 points 0 points  Months in reverse 0 points 0 points 0 points  Repeat phrase 2 points 0 points 0 points  Total Score 2 0 0    Immunizations Immunization History  Administered Date(s) Administered   Fluad Quad(high Dose 65+) 08/28/2019, 07/14/2020   Influenza, High Dose Seasonal PF 09/23/2015, 08/22/2016, 08/14/2017, 07/25/2018, 08/09/2021   Influenza-Unspecified 09/20/2014   Moderna Covid-19 Vaccine Bivalent Booster 7yrs & up 11/23/2021   Moderna Sars-Covid-2 Vaccination 12/16/2019, 01/13/2020, 09/05/2020   Pneumococcal Conjugate-13 01/12/2014    Pneumococcal Polysaccharide-23 12/28/2014   Tdap 05/14/2017   Tetanus 02/25/2007   Zoster Recombinat (Shingrix) 11/02/2021  Zoster, Live 08/11/2012    TDAP status: Up to date  Flu Vaccine status: Up to date  Pneumococcal vaccine status: Up to date  Covid-19 vaccine status: Completed vaccines  Qualifies for Shingles Vaccine? Yes   Zostavax completed Yes   Shingrix Completed?: Yes - due for second dose  Screening Tests Health Maintenance  Topic Date Due   Zoster Vaccines- Shingrix (2 of 2) 12/28/2021   MAMMOGRAM  12/20/2022   COLONOSCOPY (Pts 45-29yrs Insurance coverage will need to be confirmed)  11/21/2024   TETANUS/TDAP  05/15/2027   Pneumonia Vaccine 60+ Years old  Completed   INFLUENZA VACCINE  Completed   DEXA SCAN  Completed   COVID-19 Vaccine  Completed   Hepatitis C Screening  Completed   HPV VACCINES  Aged Out    Health Maintenance  Health Maintenance Due  Topic Date Due   Zoster Vaccines- Shingrix (2 of 2) 12/28/2021    Colorectal cancer screening: Type of screening: Colonoscopy. Completed 11/21/21. Repeat every 3 years. Pt to repeat now due to poor bowel prep; phone number provided to reschedule  Mammogram status: Completed 12/20/21. Repeat every year  Bone Density status: Completed 01/11/20. Results reflect: Bone density results: NORMAL. Repeat every 2-3 years.  Lung Cancer Screening: (Low Dose CT Chest recommended if Age 23-80 years, 30 pack-year currently smoking OR have quit w/in 15years.) does not qualify.   Additional Screening:  Hepatitis C Screening: does qualify; Completed 08/15/18  Vision Screening: Recommended annual ophthalmology exams for early detection of glaucoma and other disorders of the eye. Is the patient up to date with their annual eye exam?  Yes  Who is the provider or what is the name of the office in which the patient attends annual eye exams? [facility].   Dental Screening: Recommended annual dental exams for proper  oral hygiene  Community Resource Referral / Chronic Care Management: CRR required this visit?  No   CCM required this visit?  No      Plan:     I have personally reviewed and noted the following in the patients chart:   Medical and social history Use of alcohol, tobacco or illicit drugs  Current medications and supplements including opioid prescriptions.  Functional ability and status Nutritional status Physical activity Advanced directives List of other physicians Hospitalizations, surgeries, and ER visits in previous 12 months Vitals Screenings to include cognitive, depression, and falls Referrals and appointments  In addition, I have reviewed and discussed with patient certain preventive protocols, quality metrics, and best practice recommendations. A written personalized care plan for preventive services as well as general preventive health recommendations were provided to patient.     Clemetine Marker, LPN   0/16/0109   Nurse Notes: none

## 2022-02-13 DIAGNOSIS — R809 Proteinuria, unspecified: Secondary | ICD-10-CM | POA: Diagnosis not present

## 2022-02-13 DIAGNOSIS — H40003 Preglaucoma, unspecified, bilateral: Secondary | ICD-10-CM | POA: Diagnosis not present

## 2022-02-13 DIAGNOSIS — N1832 Chronic kidney disease, stage 3b: Secondary | ICD-10-CM | POA: Diagnosis not present

## 2022-02-13 DIAGNOSIS — H43813 Vitreous degeneration, bilateral: Secondary | ICD-10-CM | POA: Diagnosis not present

## 2022-02-13 DIAGNOSIS — Z0100 Encounter for examination of eyes and vision without abnormal findings: Secondary | ICD-10-CM | POA: Diagnosis not present

## 2022-02-13 DIAGNOSIS — H2513 Age-related nuclear cataract, bilateral: Secondary | ICD-10-CM | POA: Diagnosis not present

## 2022-02-13 DIAGNOSIS — I10 Essential (primary) hypertension: Secondary | ICD-10-CM | POA: Diagnosis not present

## 2022-02-13 DIAGNOSIS — N2581 Secondary hyperparathyroidism of renal origin: Secondary | ICD-10-CM | POA: Diagnosis not present

## 2022-04-01 ENCOUNTER — Other Ambulatory Visit: Payer: Self-pay | Admitting: Family Medicine

## 2022-04-01 DIAGNOSIS — I700 Atherosclerosis of aorta: Secondary | ICD-10-CM

## 2022-04-03 ENCOUNTER — Other Ambulatory Visit: Payer: Self-pay

## 2022-04-03 DIAGNOSIS — I700 Atherosclerosis of aorta: Secondary | ICD-10-CM

## 2022-04-03 MED ORDER — EZETIMIBE 10 MG PO TABS: 10.0000 mg | ORAL_TABLET | Freq: Every day | ORAL | 0 refills | Status: DC

## 2022-04-11 ENCOUNTER — Other Ambulatory Visit: Payer: Self-pay | Admitting: Family Medicine

## 2022-04-11 DIAGNOSIS — I700 Atherosclerosis of aorta: Secondary | ICD-10-CM

## 2022-04-11 DIAGNOSIS — E785 Hyperlipidemia, unspecified: Secondary | ICD-10-CM

## 2022-04-19 NOTE — Progress Notes (Signed)
Name: Morgan Livingston   MRN: [id number]    DOB: October 22, 1946   Date:04/20/2022       Progress Note  Subjective  Chief Complaint  Follow Up  HPI  HTN: she is back on medication losartan/hctz but down to 50/12.5 , bp is towards low end of normal, it was 140/90 during her last visit with [name], she states at home bp 116-120's/70-80's. She denies  chest pain , palpitation, SOB or orthostatic changes   History of appendectomy: done in January 2023, at the time kidney function dropped, white count was up and HCT low, she was seen by [name] and labs improved   B12 deficiency /Thrombocytopenia/MUGS  : under the care of [name], Under the care of hematologist, she will go back in September    Atherosclerosis aorta/dyslipidemia: found on X-ray of lumbar spine, she does not have claudication. She will continue statin and aspirin daily , last LDL was above 144, and we added Zetia, we will recheck labs today    Proteinuria and CKI stage III: seeing [name], she had secondary hyperparathyroidism,and vitamin D deficiency. She denies cramping, pruritis or decrease in urine output  .Last GFR was stable CKI stage III - last GFR was 41 at Dr. [name] office  She is on ARB   Positive for Sjogren's Anti SS-A and also ANA: on labs done 03/2018 by [name] , 03/2018 showed negative for M Spike UPEP spike and monitored by  [name]. She denies dry mouth , but states she has a long history of dry eyes and not changed - uses eye drops. No joint aches    Hyperglycemia: no family history of diabetes, A1C was as high at 6.4 % but last visit it was 6 %  she is eating healthy, losing weight and since she is 83 we will monitor just glucose    Patient Active Problem List   Diagnosis Date Noted   Acute appendicitis with perforation and generalized peritonitis 11/28/2021   History of IBS 06/03/2020   Mass of arm, left    MGUS (monoclonal gammopathy of unknown significance) 09/01/2018   Secondary  hyperparathyroidism ([name]) 09/01/2018   B12 deficiency 08/19/2018   Atherosclerosis of aorta ([place]) 07/25/2018   Thrombocytopenia ([place]) 05/09/2018   Proteinuria 05/09/2018   Positive ANA (antinuclear antibody) 04/02/2018   Bilateral dry eyes 04/02/2018   History of hysterectomy 05/23/2015   Chronic kidney disease, stage III (moderate) ([place]) 05/23/2015   Benign hypertension 05/22/2015   Chronic constipation 05/22/2015   DDD (degenerative disc disease), lumbar 05/22/2015   DD (diverticular disease) 05/22/2015   Dyslipidemia 05/22/2015   History of cervical cancer 75/08/2584   Helicobacter pylori gastrointestinal tract infection 05/22/2015   Blood glucose elevated 05/22/2015   Overweight (BMI 25.0-29.9) 05/22/2015   Perennial allergic rhinitis with seasonal variation 05/22/2015   Vitamin D deficiency 05/22/2015   Impingement syndrome of shoulder 01/14/2015    Past Surgical History:  Procedure Laterality Date   ABDOMINAL HYSTERECTOMY     BREAST BIOPSY Right 04/09/2006   negative   BREAST BIOPSY Left 12/04/2017   fibroadenmatoid change with coarse calcs   COLONOSCOPY WITH PROPOFOL N/A 10/07/2018   Procedure: COLONOSCOPY WITH PROPOFOL;  Surgeon: [name], MD;  Location: [facility] ENDOSCOPY;  Service: Gastroenterology;  Laterality: N/A;   COLONOSCOPY WITH PROPOFOL N/A 11/21/2021   Procedure: COLONOSCOPY WITH PROPOFOL;  Surgeon: [name], MD;  Location: [facility] ENDOSCOPY;  Service: Gastroenterology;  Laterality: N/A;   LIPOMA EXCISION Left 04/15/2020  Procedure: EXCISION LIPOMA, left arm;  Surgeon: Fredirick Maudlin, MD;  Location: [facility] ORS;  Service: General;  Laterality: Left;   XI ROBOTIC LAPAROSCOPIC ASSISTED APPENDECTOMY N/A 11/28/2021   Procedure: XI ROBOTIC LAPAROSCOPIC ASSISTED APPENDECTOMY;  Surgeon: Herbert Pun, MD;  Location: [facility] ORS;  Service: General;  Laterality: N/A;    Family History  Problem Relation Age of Onset   Congestive Heart Failure Mother 82   Heart  attack Father 94   Sinusitis Sister    Stomach cancer Sister    Uterine cancer Sister 86   Liver cancer Sister    Pancreatic cancer Sister    Dementia Sister        38   Breast cancer Neg Hx     Social History   Tobacco Use   Smoking status: Former    Packs/day: 1.00    Years: 2.00    Total pack years: 2.00    Types: Cigarettes    Quit date: 1963    Years since quitting: 60.4   Smokeless tobacco: Never   Tobacco comments:    smoking cessation materials not required  Substance Use Topics   Alcohol use: No    Alcohol/week: 0.0 standard drinks of alcohol     Current Outpatient Medications:    acetaminophen (TYLENOL) 500 MG tablet, Take 2 tablets (1,000 mg total) by mouth every 6 (six) hours as needed for mild pain or headache., Disp: 30 tablet, Rfl: 0   aspirin 81 MG tablet, Take 81 mg by mouth daily., Disp: , Rfl:    Biotin 5 MG CAPS, Take by mouth daily., Disp: , Rfl:    carboxymethylcellulose (REFRESH PLUS) 0.5 % SOLN, Place 1 drop into both eyes daily., Disp: , Rfl:    cholecalciferol (VITAMIN D) 1000 UNITS tablet, Take 1,000 Units by mouth daily. , Disp: , Rfl:    ezetimibe (ZETIA) 10 MG tablet, Take 1 tablet (10 mg total) by mouth daily., Disp: 30 tablet, Rfl: 0   ferrous sulfate 324 MG TBEC, Take 65 mg by mouth daily with breakfast., Disp: , Rfl:    fexofenadine (ALLEGRA) 180 MG tablet, Take 180 mg by mouth daily., Disp: , Rfl:    losartan-hydrochlorothiazide (HYZAAR) 50-12.5 MG tablet, Take 1 tablet by mouth daily., Disp: 90 tablet, Rfl: 1   rosuvastatin (CRESTOR) 20 MG tablet, TAKE 1 TABLET BY MOUTH EVERYDAY AT BEDTIME, Disp: 90 tablet, Rfl: 3   vitamin B-12 (CYANOCOBALAMIN) 1000 MCG tablet, Take 1 tablet (1,000 mcg total) by mouth daily. (Patient taking differently: Take 1,000 mcg by mouth once a week. Wednesday), Disp: 90 tablet, Rfl: 1   vitamin E 1000 UNIT capsule, Take 1,000 Units by mouth daily., Disp: , Rfl:   No Known Allergies  I personally reviewed active  problem list, medication list, allergies, family history, social history, health maintenance with the patient/caregiver today.   ROS  Constitutional: Negative for fever or weight change.  Respiratory: Negative for cough and shortness of breath.   Cardiovascular: Negative for chest pain or palpitations.  Gastrointestinal: Negative for abdominal pain, no bowel changes.  Musculoskeletal: Negative for gait problem or joint swelling.  Skin: Negative for rash.  Neurological: Negative for dizziness or headache.  No other specific complaints in a complete review of systems (except as listed in HPI above).   Objective  Vitals:   04/20/22 0811  BP: 112/68  Pulse: 78  Resp: 16  SpO2: 99%  Weight: 162 lb (73.5 kg)  Height: '5\' 5"'$  (1.651 m)    Body  mass index is 26.96 kg/m.  Physical Exam  Constitutional: Patient appears well-developed and well-nourished. Overweight  No distress.  HEENT: head atraumatic, normocephalic, pupils equal and reactive to light, neck supple Cardiovascular: Normal rate, regular rhythm and normal heart sounds.  No murmur heard. No BLE edema. Pulmonary/Chest: Effort normal and breath sounds normal. No respiratory distress. Abdominal: Soft.  There is no tenderness. Psychiatric: Patient has a normal mood and affect. behavior is normal. Judgment and thought content normal.   PHQ2/9:    04/20/2022    8:10 AM 01/02/2022    9:34 AM 10/18/2021    8:31 AM 04/18/2021    8:26 AM 12/29/2020    9:30 AM  Depression screen PHQ 2/9  Decreased Interest 0 0 0 0 0  Down, Depressed, Hopeless 0 0 0 0 0  PHQ - 2 Score 0 0 0 0 0  Altered sleeping 0      Tired, decreased energy 0      Change in appetite 0      Feeling bad or failure about yourself  0      Trouble concentrating 0      Moving slowly or fidgety/restless 0      Suicidal thoughts 0      PHQ-9 Score 0        phq 9 is negative   Fall Risk:    04/20/2022    8:10 AM 01/02/2022    9:37 AM 10/18/2021    8:31 AM  04/18/2021    8:26 AM 12/29/2020    9:36 AM  Fall Risk   Falls in the past year? 0 0 0 0 0  Number falls in past yr: 0 0  0 0  Injury with Fall? 0 0  0 0  Risk for fall due to : No Fall Risks No Fall Risks   No Fall Risks  Follow up Falls prevention discussed Falls prevention discussed Falls prevention discussed  Falls prevention discussed      Functional Status Survey: Is the patient deaf or have difficulty hearing?: No Does the patient have difficulty seeing, even when wearing glasses/contacts?: No Does the patient have difficulty concentrating, remembering, or making decisions?: No Does the patient have difficulty walking or climbing stairs?: No Does the patient have difficulty dressing or bathing?: No Does the patient have difficulty doing errands alone such as visiting a doctor's office or shopping?: No    Assessment & Plan  1. Atherosclerosis of aorta (HCC)  - ezetimibe (ZETIA) 10 MG tablet; Take 1 tablet (10 mg total) by mouth daily.  Dispense: 90 tablet; Refill: 1  2. Thrombocytopathia ([place])  Under the care of [name]  3. Stage 3b chronic kidney disease ([place])   4. Secondary hyperparathyroidism ([place])  Monitored by [name]   5. Essential hypertension  - COMPLETE METABOLIC PANEL WITH GFR - losartan-hydrochlorothiazide (HYZAAR) 50-12.5 MG tablet; Take 1 tablet by mouth daily.  Dispense: 90 tablet; Refill: 1  6. B12 deficiency  Monitored by [name]   7. Vitamin D deficiency  - VITAMIN D 25 Hydroxy (Vit-D Deficiency, Fractures)  8. Dyslipidemia  - Lipid panel - losartan-hydrochlorothiazide (HYZAAR) 50-12.5 MG tablet; Take 1 tablet by mouth daily.  Dispense: 90 tablet; Refill: 1  9. MGUS (monoclonal gammopathy of unknown significance)

## 2022-04-20 ENCOUNTER — Ambulatory Visit: Payer: Medicare PPO | Admitting: Family Medicine

## 2022-04-20 ENCOUNTER — Encounter: Payer: Self-pay | Admitting: Family Medicine

## 2022-04-20 VITALS — BP 112/68 | HR 78 | Resp 16 | Ht 65.0 in | Wt 162.0 lb

## 2022-04-20 DIAGNOSIS — E538 Deficiency of other specified B group vitamins: Secondary | ICD-10-CM

## 2022-04-20 DIAGNOSIS — D691 Qualitative platelet defects: Secondary | ICD-10-CM | POA: Diagnosis not present

## 2022-04-20 DIAGNOSIS — E785 Hyperlipidemia, unspecified: Secondary | ICD-10-CM

## 2022-04-20 DIAGNOSIS — N2581 Secondary hyperparathyroidism of renal origin: Secondary | ICD-10-CM | POA: Diagnosis not present

## 2022-04-20 DIAGNOSIS — D472 Monoclonal gammopathy: Secondary | ICD-10-CM | POA: Diagnosis not present

## 2022-04-20 DIAGNOSIS — E559 Vitamin D deficiency, unspecified: Secondary | ICD-10-CM

## 2022-04-20 DIAGNOSIS — N1832 Chronic kidney disease, stage 3b: Secondary | ICD-10-CM | POA: Diagnosis not present

## 2022-04-20 DIAGNOSIS — I700 Atherosclerosis of aorta: Secondary | ICD-10-CM | POA: Diagnosis not present

## 2022-04-20 DIAGNOSIS — D696 Thrombocytopenia, unspecified: Secondary | ICD-10-CM | POA: Insufficient documentation

## 2022-04-20 DIAGNOSIS — I10 Essential (primary) hypertension: Secondary | ICD-10-CM

## 2022-04-20 MED ORDER — LOSARTAN POTASSIUM-HCTZ 50-12.5 MG PO TABS: 1.0000 | ORAL_TABLET | Freq: Every day | ORAL | 1 refills | Status: DC

## 2022-04-20 MED ORDER — EZETIMIBE 10 MG PO TABS: 10.0000 mg | ORAL_TABLET | Freq: Every day | ORAL | 1 refills | Status: DC

## 2022-04-21 LAB — COMPLETE METABOLIC PANEL WITH GFR
AG Ratio: 1.2 (calc) (ref 1.0–2.5)
ALT: 20 U/L (ref 6–29)
AST: 26 U/L (ref 10–35)
Albumin: 4.1 g/dL (ref 3.6–5.1)
Alkaline phosphatase (APISO): 52 U/L (ref 37–153)
BUN/Creatinine Ratio: 13 (calc) (ref 6–22)
BUN: 18 mg/dL (ref 7–25)
CO2: 29 mmol/L (ref 20–32)
Calcium: 10.1 mg/dL (ref 8.6–10.4)
Chloride: 103 mmol/L (ref 98–110)
Creat: 1.43 mg/dL — ABNORMAL HIGH (ref 0.60–1.00)
Globulin: 3.3 g/dL (calc) (ref 1.9–3.7)
Glucose, Bld: 83 mg/dL (ref 65–99)
Potassium: 4.2 mmol/L (ref 3.5–5.3)
Sodium: 140 mmol/L (ref 135–146)
Total Bilirubin: 0.5 mg/dL (ref 0.2–1.2)
Total Protein: 7.4 g/dL (ref 6.1–8.1)
eGFR: 38 mL/min/{1.73_m2} — ABNORMAL LOW (ref >=60)

## 2022-04-21 LAB — VITAMIN D 25 HYDROXY (VIT D DEFICIENCY, FRACTURES): Vit D, 25-Hydroxy: 40 ng/mL (ref 30–100)

## 2022-04-21 LAB — LIPID PANEL
Cholesterol: 204 mg/dL — ABNORMAL HIGH (ref <200)
HDL: 45 mg/dL — ABNORMAL LOW (ref >=50)
LDL Cholesterol (Calc): 134 mg/dL (calc) — ABNORMAL HIGH
Non-HDL Cholesterol (Calc): 159 mg/dL (calc) — ABNORMAL HIGH (ref <130)
Total CHOL/HDL Ratio: 4.5 (calc) (ref <5.0)
Triglycerides: 137 mg/dL (ref <150)

## 2022-06-25 DIAGNOSIS — I10 Essential (primary) hypertension: Secondary | ICD-10-CM | POA: Diagnosis not present

## 2022-06-25 DIAGNOSIS — N1832 Chronic kidney disease, stage 3b: Secondary | ICD-10-CM | POA: Diagnosis not present

## 2022-06-25 DIAGNOSIS — R809 Proteinuria, unspecified: Secondary | ICD-10-CM | POA: Diagnosis not present

## 2022-06-25 DIAGNOSIS — N2581 Secondary hyperparathyroidism of renal origin: Secondary | ICD-10-CM | POA: Diagnosis not present

## 2022-07-18 DIAGNOSIS — R9431 Abnormal electrocardiogram [ECG] [EKG]: Secondary | ICD-10-CM | POA: Diagnosis not present

## 2022-07-18 DIAGNOSIS — R42 Dizziness and giddiness: Secondary | ICD-10-CM | POA: Diagnosis not present

## 2022-07-18 DIAGNOSIS — N183 Chronic kidney disease, stage 3 unspecified: Secondary | ICD-10-CM | POA: Diagnosis not present

## 2022-07-18 DIAGNOSIS — J329 Chronic sinusitis, unspecified: Secondary | ICD-10-CM | POA: Diagnosis not present

## 2022-07-18 DIAGNOSIS — I10 Essential (primary) hypertension: Secondary | ICD-10-CM | POA: Diagnosis not present

## 2022-07-18 DIAGNOSIS — E782 Mixed hyperlipidemia: Secondary | ICD-10-CM | POA: Diagnosis not present

## 2022-07-18 DIAGNOSIS — R002 Palpitations: Secondary | ICD-10-CM | POA: Diagnosis not present

## 2022-07-31 ENCOUNTER — Inpatient Hospital Stay: Payer: Medicare PPO | Attending: Oncology

## 2022-07-31 DIAGNOSIS — D696 Thrombocytopenia, unspecified: Secondary | ICD-10-CM

## 2022-07-31 DIAGNOSIS — D631 Anemia in chronic kidney disease: Secondary | ICD-10-CM | POA: Insufficient documentation

## 2022-07-31 DIAGNOSIS — E538 Deficiency of other specified B group vitamins: Secondary | ICD-10-CM | POA: Insufficient documentation

## 2022-07-31 DIAGNOSIS — N1832 Chronic kidney disease, stage 3b: Secondary | ICD-10-CM | POA: Diagnosis not present

## 2022-07-31 LAB — FERRITIN: Ferritin: 37 ng/mL (ref 11–307)

## 2022-07-31 LAB — CBC WITH DIFFERENTIAL/PLATELET
Abs Immature Granulocytes: 0.01 10*3/uL (ref 0.00–0.07)
Basophils Absolute: 0 10*3/uL (ref 0.0–0.1)
Basophils Relative: 1 %
Eosinophils Absolute: 0.1 10*3/uL (ref 0.0–0.5)
Eosinophils Relative: 1 %
HCT: 39.2 % (ref 36.0–46.0)
Hemoglobin: 13.1 g/dL (ref 12.0–15.0)
Immature Granulocytes: 0 %
Lymphocytes Relative: 44 %
Lymphs Abs: 2.9 10*3/uL (ref 0.7–4.0)
MCH: 31 pg (ref 26.0–34.0)
MCHC: 33.4 g/dL (ref 30.0–36.0)
MCV: 92.7 fL (ref 80.0–100.0)
Monocytes Absolute: 0.7 10*3/uL (ref 0.1–1.0)
Monocytes Relative: 11 %
Neutro Abs: 2.8 10*3/uL (ref 1.7–7.7)
Neutrophils Relative %: 43 %
Platelets: 145 10*3/uL — ABNORMAL LOW (ref 150–400)
RBC: 4.23 MIL/uL (ref 3.87–5.11)
RDW: 13.3 % (ref 11.5–15.5)
WBC: 6.6 10*3/uL (ref 4.0–10.5)
nRBC: 0 % (ref 0.0–0.2)

## 2022-07-31 LAB — IRON AND TIBC
Iron: 96 ug/dL (ref 28–170)
Saturation Ratios: 30 % (ref 10.4–31.8)
TIBC: 321 ug/dL (ref 250–450)
UIBC: 225 ug/dL

## 2022-07-31 LAB — VITAMIN B12: Vitamin B-12: 193 pg/mL (ref 180–914)

## 2022-08-02 ENCOUNTER — Inpatient Hospital Stay: Payer: Medicare PPO | Admitting: Oncology

## 2022-08-02 ENCOUNTER — Encounter: Payer: Self-pay | Admitting: Oncology

## 2022-08-02 ENCOUNTER — Inpatient Hospital Stay: Payer: Medicare PPO

## 2022-08-02 VITALS — BP 148/83 | HR 58 | Temp 97.8°F | Resp 20 | Wt 168.7 lb

## 2022-08-02 DIAGNOSIS — D696 Thrombocytopenia, unspecified: Secondary | ICD-10-CM | POA: Diagnosis not present

## 2022-08-02 DIAGNOSIS — D691 Qualitative platelet defects: Secondary | ICD-10-CM

## 2022-08-02 DIAGNOSIS — E538 Deficiency of other specified B group vitamins: Secondary | ICD-10-CM | POA: Diagnosis not present

## 2022-08-02 DIAGNOSIS — D631 Anemia in chronic kidney disease: Secondary | ICD-10-CM

## 2022-08-02 DIAGNOSIS — N1832 Chronic kidney disease, stage 3b: Secondary | ICD-10-CM | POA: Diagnosis not present

## 2022-08-02 MED ORDER — CYANOCOBALAMIN 1000 MCG/ML IJ SOLN
1000.0000 ug | Freq: Once | INTRAMUSCULAR | Status: AC
  Administered 2022-08-02: 1000 ug via INTRAMUSCULAR
  Filled 2022-08-02: qty 1

## 2022-08-02 NOTE — Assessment & Plan Note (Signed)
Antiparietal and intrinsic factor antibodies are both negative. Recommend to switch oral B12 to B12 IM injection 106mg monthly.

## 2022-08-02 NOTE — Assessment & Plan Note (Signed)
Stable hemoglobin.  Continue Vitron C 1 tablet

## 2022-08-02 NOTE — Progress Notes (Signed)
Hematology/Oncology Progress note [phone] [phone]      Patient Care Team: Steele Sizer, MD as PCP - General (Family Medicine) Earlie Server, MD as Consulting Physician (Oncology) Anthonette Legato, MD (Nephrology) Yolonda Kida, MD as Consulting Physician (Cardiology) [name], MD as Consulting Physician (Gastroenterology)  ASSESSMENT & PLAN:   B12 deficiency Antiparietal and intrinsic factor antibodies are both negative. Recommend to switch oral B12 to B12 IM injection 1029mg monthly.    Anemia in stage 3b chronic kidney disease (HCC) Stable hemoglobin.  Continue Vitron C 1 tablet  Thrombocytopathia (HCC) platelet counts are stable.  Patient has had previous work-up done including negative hepatitis, HIV.  Monitor counts.    Orders Placed This Encounter  Procedures   CBC with Differential/Platelet    Standing Status:   Future    Standing Expiration Date:   08/03/2023   Comprehensive metabolic panel    Standing Status:   Future    Standing Expiration Date:   08/03/2023   Vitamin B12    Standing Status:   Future    Standing Expiration Date:   08/03/2023   Iron and TIBC([facility])    Standing Status:   Future    Standing Expiration Date:   08/03/2023   Ferritin    Standing Status:   Future    Standing Expiration Date:   08/03/2023   Vitamin B12    Standing Status:   Future    Standing Expiration Date:   08/03/2023   Follow up  Lab B12 level in 6 months.  1 year lab prior to MD cbc cmp b12, iron tibc ferritin   All questions were answered. The patient knows to call the clinic with any problems, questions or concerns.  ZEarlie Server MD, PhD [facility] Oncology 08/02/2022   REASON FOR VISIT Follow up for management of abnormal UPEP  HISTORY OF PRESENTING ILLNESS:  Morgan CRESWELLis a  76y.o.  female with PMH listed below who was referred to me for evaluation of abnormal UPEP.   She has CKD and follows up with  Dr.Lateef. History of HTN. Reports doing well, denies bone pain.  Fatigue:  Chronic onset, perisistent, no aggravating or improving factors, no associated symptoms.  [facility]  03/05/2018 [facility]P done [facility] for M Spike UPEP: M spike 16.4  # colonoscopy done on 12 02/05/2016 with findings of polyps which were resected and retrieved.  Pathology showed negative for high-grade dysplasia or malignancy.  + Tubular adenoma   INTERVAL HISTORY Morgan MCKENNYis a 76y.o. female who has above history reviewed by me today presents for follow up visit for management of thrombocytopenia and vitamin B12 deficiency,  Patient has no new complaints today. She takes oral vitamin B12 daily and oral iron supplementation daily.     Review of Systems  Constitutional:  Positive for malaise/fatigue. Negative for chills, fever and weight loss.  HENT:  Negative for sore throat.   Eyes:  Negative for redness.  Respiratory:  Negative for cough, shortness of breath and wheezing.   Cardiovascular:  Negative for chest pain, palpitations and leg swelling.  Gastrointestinal:  Negative for abdominal pain, blood in stool, heartburn, nausea and vomiting.  Genitourinary:  Negative for dysuria.  Musculoskeletal:  Negative for myalgias.  Skin:  Negative for rash.  Neurological:  Negative for dizziness, tingling and tremors. Seizures: . Endo/Heme/Allergies:  Does not bruise/bleed easily.  Psychiatric/Behavioral:  Negative  for hallucinations.     MEDICAL HISTORY:  Past Medical History:  Diagnosis Date   Allergy    Cancer ([place])    Uterine Cancer   Hyperlipidemia    Hypertension    Kidney stones 06/21/2018    SURGICAL HISTORY: Past Surgical History:  Procedure Laterality Date   ABDOMINAL HYSTERECTOMY     BREAST BIOPSY Right 04/09/2006   negative   BREAST BIOPSY Left 12/04/2017   fibroadenmatoid change with coarse calcs    COLONOSCOPY WITH PROPOFOL N/A 10/07/2018   Procedure: COLONOSCOPY WITH PROPOFOL;  Surgeon: [name], MD;  Location: [facility] ENDOSCOPY;  Service: Gastroenterology;  Laterality: N/A;   COLONOSCOPY WITH PROPOFOL N/A 11/21/2021   Procedure: COLONOSCOPY WITH PROPOFOL;  Surgeon: [name], MD;  Location: [facility] ENDOSCOPY;  Service: Gastroenterology;  Laterality: N/A;   LIPOMA EXCISION Left 04/15/2020   Procedure: EXCISION LIPOMA, left arm;  Surgeon: Fredirick Maudlin, MD;  Location: [facility] ORS;  Service: General;  Laterality: Left;   XI ROBOTIC LAPAROSCOPIC ASSISTED APPENDECTOMY N/A 11/28/2021   Procedure: XI ROBOTIC LAPAROSCOPIC ASSISTED APPENDECTOMY;  Surgeon: Herbert Pun, MD;  Location: [facility] ORS;  Service: General;  Laterality: N/A;    SOCIAL HISTORY: Social History   Socioeconomic History   Marital status: Married    Spouse name: Junior   Number of children: 2   Years of education: some college   Highest education level: 12th grade  Occupational History   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   Tobacco Use   Smoking status: Former    Packs/day: 1.00    Years: 2.00    Total pack years: 2.00    Types: Cigarettes    Quit date: 1963    Years since quitting: 60.7   Smokeless tobacco: Never   Tobacco comments:    smoking cessation materials not required  Vaping Use   Vaping Use: Never used  Substance and Sexual Activity   Alcohol use: No    Alcohol/week: 0.0 standard drinks of alcohol   Drug use: No   Sexual activity: Yes    Partners: Male    Birth control/protection: Post-menopausal  Other Topics Concern   Not on file  Social History Narrative   Married for 50 years    Social Determinants of Health   Financial Resource Strain: Low Risk  (01/02/2022)   Overall Financial Resource Strain (CARDIA)    Difficulty of Paying Living Expenses: Not hard at all  Food Insecurity: No Food Insecurity (01/02/2022)   Hunger Vital Sign    Worried About Running Out of Food  in the Last Year: Never true    [place] in the Last Year: Never true  Transportation Needs: No Transportation Needs (01/02/2022)   PRAPARE - Hydrologist (Medical): No    Lack of Transportation (Non-Medical): No  Physical Activity: Insufficiently Active (01/02/2022)   Exercise Vital Sign    Days of Exercise per Week: 7 days    Minutes of Exercise per Session: 20 min  Stress: No Stress Concern Present (01/02/2022)   [place]    Feeling of Stress : Not at all  Social Connections: [place] (01/02/2022)   Social Connection and Isolation Panel [NHANES]    Frequency of Communication with Friends and Family: More than three times a week    Frequency of Social Gatherings with Friends and Family: More than three times a week    Attends Religious Services: More  than 4 times per year    Active Member of Clubs or Organizations: Yes    Attends Archivist Meetings: More than 4 times per year    Marital Status: Married  Human resources officer Violence: Not At Risk (01/02/2022)   Humiliation, Afraid, Rape, and Kick questionnaire    Fear of Current or Ex-Partner: No    Emotionally Abused: No    Physically Abused: No    Sexually Abused: No    FAMILY HISTORY: Family History  Problem Relation Age of Onset   Congestive Heart Failure Mother 46   Heart attack Father 40   Sinusitis Sister    Stomach cancer Sister    Uterine cancer Sister 39   Liver cancer Sister    Pancreatic cancer Sister    Dementia Sister        94   Breast cancer Neg Hx     ALLERGIES:  has No Known Allergies.  MEDICATIONS:  Current Outpatient Medications  Medication Sig Dispense Refill   acetaminophen (TYLENOL) 500 MG tablet Take 2 tablets (1,000 mg total) by mouth every 6 (six) hours as needed for mild pain or headache. 30 tablet 0   aspirin 81 MG tablet Take 81 mg by mouth daily.     Biotin 5 MG CAPS  Take by mouth daily.     carboxymethylcellulose (REFRESH PLUS) 0.5 % SOLN Place 1 drop into both eyes daily.     cholecalciferol (VITAMIN D) 1000 UNITS tablet Take 1,000 Units by mouth daily.      ezetimibe (ZETIA) 10 MG tablet Take 1 tablet (10 mg total) by mouth daily. 90 tablet 1   ferrous sulfate 324 MG TBEC Take 65 mg by mouth daily with breakfast.     fexofenadine (ALLEGRA) 180 MG tablet Take 180 mg by mouth daily.     losartan-hydrochlorothiazide (HYZAAR) 50-12.5 MG tablet Take 1 tablet by mouth daily. 90 tablet 1   rosuvastatin (CRESTOR) 20 MG tablet TAKE 1 TABLET BY MOUTH EVERYDAY AT BEDTIME 90 tablet 3   vitamin B-12 (CYANOCOBALAMIN) 1000 MCG tablet Take 1 tablet (1,000 mcg total) by mouth daily. (Patient taking differently: Take 1,000 mcg by mouth once a week. Wednesday) 90 tablet 1   vitamin E 1000 UNIT capsule Take 1,000 Units by mouth daily.     No current facility-administered medications for this visit.     PHYSICAL EXAMINATION: ECOG PERFORMANCE STATUS: 1 - Symptomatic but completely ambulatory Vitals:   08/02/22 0951  BP: (!) 148/83  Pulse: (!) 58  Resp: 20  Temp: 97.8 F (36.6 C)  SpO2: 100%   Filed Weights   08/02/22 0951  Weight: 168 lb 11.2 oz (76.5 kg)    Physical Exam Constitutional:      General: She is not in acute distress. HENT:     Head: Normocephalic and atraumatic.  Eyes:     General: No scleral icterus.    Conjunctiva/sclera: Conjunctivae normal.     Pupils: Pupils are equal, round, and reactive to light.  Cardiovascular:     Rate and Rhythm: Normal rate and regular rhythm.     Heart sounds: Normal heart sounds.  Pulmonary:     Effort: Pulmonary effort is normal. No respiratory distress.     Breath sounds: Normal breath sounds. No wheezing or rales.  Chest:     Chest wall: No tenderness.  Abdominal:     General: Bowel sounds are normal. There is no distension.     Palpations: Abdomen is soft. There is  no mass.     Tenderness: There is  no abdominal tenderness.  Musculoskeletal:        General: No deformity. Normal range of motion.     Cervical back: Normal range of motion and neck supple.  Lymphadenopathy:     Cervical: No cervical adenopathy.  Skin:    General: Skin is warm and dry.     Findings: No erythema or rash.  Neurological:     Mental Status: She is alert and oriented to person, place, and time.     Cranial Nerves: No cranial nerve deficit.     Coordination: Coordination normal.  Psychiatric:        Behavior: Behavior normal.        Thought Content: Thought content normal.      LABORATORY DATA:  I have reviewed the data as listed Lab Results  Component Value Date   WBC 6.6 07/31/2022   HGB 13.1 07/31/2022   HCT 39.2 07/31/2022   MCV 92.7 07/31/2022   PLT 145 (L) 07/31/2022   Recent Labs    11/28/21 0713 11/29/21 0829 04/20/22 0847  NA 135 136 140  K 3.5 4.1 4.2  CL 102 104 103  CO2 '23 26 29  '$ GLUCOSE 135* 118* 83  BUN 22 28* 18  CREATININE 1.44* 1.78* 1.43*  CALCIUM 9.3 8.5* 10.1  GFRNONAA 38* 30*  --   PROT 7.1  --  7.4  ALBUMIN 3.4*  --   --   AST 21  --  26  ALT 13  --  20  ALKPHOS 37*  --   --   BILITOT 0.7  --  0.5    Iron/TIBC/Ferritin/ %Sat    Component Value Date/Time   IRON 96 07/31/2022 1002   IRON 76 05/23/2015 0914   TIBC 321 07/31/2022 1002   TIBC 276 05/23/2015 0914   FERRITIN 37 07/31/2022 1002   FERRITIN 57 05/23/2015 0914   IRONPCTSAT 30 07/31/2022 1002   IRONPCTSAT 28 05/23/2015 0914    04/17/2018  SPEP no M spike. Normal serum free light chain ratio. UPEP, no M spike, elevated free kappa/lamda ratio at 20.75.   08/12/2018 UPEP no M spike

## 2022-08-02 NOTE — Assessment & Plan Note (Signed)
platelet counts are stable.  Patient has had previous work-up done including negative hepatitis, HIV.  Monitor counts.

## 2022-08-09 ENCOUNTER — Ambulatory Visit: Payer: Medicare PPO | Admitting: Oncology

## 2022-08-13 ENCOUNTER — Ambulatory Visit (INDEPENDENT_AMBULATORY_CARE_PROVIDER_SITE_OTHER): Payer: Medicare PPO

## 2022-08-13 DIAGNOSIS — H40003 Preglaucoma, unspecified, bilateral: Secondary | ICD-10-CM | POA: Diagnosis not present

## 2022-08-13 DIAGNOSIS — Z23 Encounter for immunization: Secondary | ICD-10-CM

## 2022-08-31 ENCOUNTER — Inpatient Hospital Stay: Payer: Medicare PPO | Attending: Oncology

## 2022-08-31 DIAGNOSIS — E538 Deficiency of other specified B group vitamins: Secondary | ICD-10-CM

## 2022-08-31 MED ORDER — CYANOCOBALAMIN 1000 MCG/ML IJ SOLN
1000.0000 ug | Freq: Once | INTRAMUSCULAR | Status: AC
  Administered 2022-08-31: 1000 ug via INTRAMUSCULAR
  Filled 2022-08-31: qty 1

## 2022-10-01 ENCOUNTER — Inpatient Hospital Stay: Payer: Medicare PPO | Attending: Oncology

## 2022-10-01 DIAGNOSIS — E538 Deficiency of other specified B group vitamins: Secondary | ICD-10-CM | POA: Diagnosis not present

## 2022-10-01 MED ORDER — CYANOCOBALAMIN 1000 MCG/ML IJ SOLN
1000.0000 ug | Freq: Once | INTRAMUSCULAR | Status: AC
  Administered 2022-10-01: 1000 ug via INTRAMUSCULAR
  Filled 2022-10-01: qty 1

## 2022-10-13 ENCOUNTER — Other Ambulatory Visit: Payer: Self-pay | Admitting: Family Medicine

## 2022-10-13 DIAGNOSIS — I700 Atherosclerosis of aorta: Secondary | ICD-10-CM

## 2022-10-18 NOTE — Progress Notes (Signed)
Name: Morgan Livingston   MRN: [id number]    DOB: 1946/02/22   Date:10/19/2022       Progress Note  Subjective  Chief Complaint  Follow Up  HPI  HTN: she is back on medication losartan/hctz but down to 50/12.5 ,bp has been above goal lately, we will adjust dose to 100/12.5 mg and keep follow up with nephrologist She denies  chest pain , palpitation, SOB or orthostatic changes   B12 deficiency /Thrombocytopenia/MUGS  : under the care of [name], Under the care of hematologist,  stable    Atherosclerosis aorta/dyslipidemia: found on X-ray of lumbar spine, she does not have claudication. She will continue statin and aspirin daily , last LDL was above 144, and we added Zetia, we will recheck labs today    Proteinuria and CKI stage III b : seeing [name], she had secondary hyperparathyroidism,and vitamin D deficiency. She denies cramping, pruritis or decrease in urine output  .We will recheck labs, she is on ARB and we will increase dose of Losartan since bp is still above 130 and she denies dizziness  Positive for Sjogren's Anti SS-A and also ANA: on labs done 03/2018 by [name] , 03/2018 showed negative for M Spike UPEP spike and monitored by  [name]. She denies dry mouth , but states she has a long history of dry eyes and not changed - uses eye drops. She denies vaginal dryness    Hyperglycemia: no family history of diabetes, A1C was as high at 6.4 % but last visit it was 6 %  she is eating healthy, losing weight another 5 lbs since last visit  Patient Active Problem List   Diagnosis Date Noted   Anemia in stage 3b chronic kidney disease ([place]) 08/02/2022   Thrombocytopenia ([place]) 08/02/2022   Thrombocytopathia ([place]) 04/20/2022   History of IBS 06/03/2020   Mass of arm, left    MGUS (monoclonal gammopathy of unknown significance) 09/01/2018   Secondary hyperparathyroidism ([place]) 09/01/2018   B12 deficiency 08/19/2018   Atherosclerosis of aorta ([place]) 07/25/2018   Proteinuria 05/09/2018    Positive ANA (antinuclear antibody) 04/02/2018   Bilateral dry eyes 04/02/2018   History of hysterectomy 05/23/2015   Stage 3b chronic kidney disease ([place]) 05/23/2015   Essential hypertension 05/22/2015   Chronic constipation 05/22/2015   DDD (degenerative disc disease), lumbar 05/22/2015   DD (diverticular disease) 05/22/2015   Dyslipidemia 05/22/2015   History of cervical cancer 99/83/3825   Helicobacter pylori gastrointestinal tract infection 05/22/2015   Blood glucose elevated 05/22/2015   Overweight (BMI 25.0-29.9) 05/22/2015   Perennial allergic rhinitis with seasonal variation 05/22/2015   Vitamin D deficiency 05/22/2015   Impingement syndrome of shoulder 01/14/2015    Past Surgical History:  Procedure Laterality Date   ABDOMINAL HYSTERECTOMY     BREAST BIOPSY Right 04/09/2006   negative   BREAST BIOPSY Left 12/04/2017   fibroadenmatoid change with coarse calcs   COLONOSCOPY WITH PROPOFOL N/A 10/07/2018   Procedure: COLONOSCOPY WITH PROPOFOL;  Surgeon: [name], MD;  Location[facility] ENDOSCOPY;  Service: Gastroenterology;  Laterality: N/A;   COLONOSCOPY WITH PROPOFOL N/A 11/21/2021   Procedure: COLONOSCOPY WITH PROPOFOL;  Surgeon: [name], MD;  Location: [facility] ENDOSCOPY;  Service: Gastroenterology;  Laterality: N/A;   LIPOMA EXCISION Left 04/15/2020   Procedure: EXCISION LIPOMA, left arm;  Surgeon: Fredirick Maudlin, MD;  Location: [facility] ORS;  Service: General;  Laterality: Left;   XI ROBOTIC LAPAROSCOPIC ASSISTED APPENDECTOMY N/A 11/28/2021   Procedure: XI ROBOTIC LAPAROSCOPIC  ASSISTED APPENDECTOMY;  Surgeon: Herbert Pun, MD;  Location: [facility] ORS;  Service: General;  Laterality: N/A;    Family History  Problem Relation Age of Onset   Congestive Heart Failure Mother 97   Heart attack Father 87   Sinusitis Sister    Stomach cancer Sister    Uterine cancer Sister 66   Liver cancer Sister    Pancreatic cancer Sister    Dementia Sister        21   Breast  cancer Neg Hx     Social History   Tobacco Use   Smoking status: Former    Packs/day: 1.00    Years: 2.00    Total pack years: 2.00    Types: Cigarettes    Quit date: 1963    Years since quitting: 60.9   Smokeless tobacco: Never   Tobacco comments:    smoking cessation materials not required  Substance Use Topics   Alcohol use: No    Alcohol/week: 0.0 standard drinks of alcohol     Current Outpatient Medications:    acetaminophen (TYLENOL) 500 MG tablet, Take 2 tablets (1,000 mg total) by mouth every 6 (six) hours as needed for mild pain or headache., Disp: 30 tablet, Rfl: 0   aspirin 81 MG tablet, Take 81 mg by mouth daily., Disp: , Rfl:    Biotin 5 MG CAPS, Take by mouth daily., Disp: , Rfl:    carboxymethylcellulose (REFRESH PLUS) 0.5 % SOLN, Place 1 drop into both eyes daily., Disp: , Rfl:    cholecalciferol (VITAMIN D) 1000 UNITS tablet, Take 1,000 Units by mouth daily. , Disp: , Rfl:    ezetimibe (ZETIA) 10 MG tablet, TAKE 1 TABLET BY MOUTH EVERY DAY, Disp: 90 tablet, Rfl: 0   ferrous sulfate 324 MG TBEC, Take 65 mg by mouth daily with breakfast., Disp: , Rfl:    fexofenadine (ALLEGRA) 180 MG tablet, Take 180 mg by mouth daily., Disp: , Rfl:    losartan-hydrochlorothiazide (HYZAAR) 50-12.5 MG tablet, Take 1 tablet by mouth daily., Disp: 90 tablet, Rfl: 1   rosuvastatin (CRESTOR) 20 MG tablet, TAKE 1 TABLET BY MOUTH EVERYDAY AT BEDTIME, Disp: 90 tablet, Rfl: 3   vitamin B-12 (CYANOCOBALAMIN) 1000 MCG tablet, Take 1 tablet (1,000 mcg total) by mouth daily. (Patient taking differently: Take 1,000 mcg by mouth once a week. Wednesday), Disp: 90 tablet, Rfl: 1   vitamin E 1000 UNIT capsule, Take 1,000 Units by mouth daily., Disp: , Rfl:   No Known Allergies  I personally reviewed active problem list, medication list, allergies, family history, social history, health maintenance with the patient/caregiver today.   ROS  Constitutional: Negative for fever , positive weight  change.  Respiratory: Negative for cough and shortness of breath.   Cardiovascular: Negative for chest pain or palpitations.  Gastrointestinal: Negative for abdominal pain, no bowel changes.  Musculoskeletal: Negative for gait problem or joint swelling.  Skin: Negative for rash.  Neurological: Negative for dizziness or headache.  No other specific complaints in a complete review of systems (except as listed in HPI above).   Objective  Vitals:   10/19/22 0923  BP: 132/80  Pulse: 63  Resp: 16  Temp: 97.8 F (36.6 C)  TempSrc: Oral  SpO2: 96%  Weight: 164 lb 12.8 oz (74.8 kg)  Height: '5\' 5"'$  (1.651 m)    Body mass index is 27.42 kg/m.  Physical Exam  Constitutional: Patient appears well-developed and well-nourished. No distress.  HEENT: head atraumatic, normocephalic, pupils equal and  reactive to light, neck supple Cardiovascular: Normal rate, regular rhythm and normal heart sounds.  No murmur heard. No BLE edema. Pulmonary/Chest: Effort normal and breath sounds normal. No respiratory distress. Abdominal: Soft.  There is no tenderness. Psychiatric: Patient has a normal mood and affect. behavior is normal. Judgment and thought content normal.   Recent Results (from the past 2160 hour(s))  Vitamin B12     Status: None   Collection Time: 07/31/22 10:02 AM  Result Value Ref Range   Vitamin B-12 193 180 - 914 pg/mL    Comment: (NOTE) This assay is not validated for testing neonatal or myeloproliferative syndrome specimens for Vitamin B12 levels. Performed at [facility], [place] [address] [place], [place] [address]   Iron and TIBC     Status: None   Collection Time: 07/31/22 10:02 AM  Result Value Ref Range   Iron 96 28 - 170 ug/dL   TIBC 321 250 - 450 ug/dL   Saturation Ratios 30 10.4 - 31.8 %   UIBC 225 ug/dL    Comment: Performed at [facility], [address] [place], [place] [address]  Ferritin     Status: None   Collection Time: 07/31/22 10:02 AM   Result Value Ref Range   Ferritin 37 11 - 307 ng/mL    Comment: Performed at [facility], [address] [place], [place] [address]  CBC with Differential/Platelet     Status: Abnormal   Collection Time: 07/31/22 10:02 AM  Result Value Ref Range   WBC 6.6 4.0 - 10.5 K/uL   RBC 4.23 3.87 - 5.11 MIL/uL   Hemoglobin 13.1 12.0 - 15.0 g/dL   HCT 39.2 36.0 - 46.0 %   MCV 92.7 80.0 - 100.0 fL   MCH 31.0 26.0 - 34.0 pg   MCHC 33.4 30.0 - 36.0 g/dL   RDW 13.3 11.5 - 15.5 %   Platelets 145 (L) 150 - 400 K/uL   nRBC 0.0 0.0 - 0.2 %   Neutrophils Relative % 43 %   Neutro Abs 2.8 1.7 - 7.7 K/uL   Lymphocytes Relative 44 %   Lymphs Abs 2.9 0.7 - 4.0 K/uL   Monocytes Relative 11 %   Monocytes Absolute 0.7 0.1 - 1.0 K/uL   Eosinophils Relative 1 %   Eosinophils Absolute 0.1 0.0 - 0.5 K/uL   Basophils Relative 1 %   Basophils Absolute 0.0 0.0 - 0.1 K/uL   Immature Granulocytes 0 %   Abs Immature Granulocytes 0.01 0.00 - 0.07 K/uL    Comment: Performed at [facility], [address] [place], [place] [address]    PHQ2/9:    10/19/2022    9:25 AM 04/20/2022    8:10 AM 01/02/2022    9:34 AM 10/18/2021    8:31 AM 04/18/2021    8:26 AM  Depression screen PHQ 2/9  Decreased Interest 0 0 0 0 0  Down, Depressed, Hopeless 0 0 0 0 0  PHQ - 2 Score 0 0 0 0 0  Altered sleeping 0 0     Tired, decreased energy 0 0     Change in appetite 0 0     Feeling bad or failure about yourself  0 0     Trouble concentrating 0 0     Moving slowly or fidgety/restless 0 0     Suicidal thoughts 0 0     PHQ-9 Score 0 0       phq 9 is  negative   Fall Risk:    10/19/2022    9:25 AM 04/20/2022    8:10 AM 01/02/2022    9:37 AM 10/18/2021    8:31 AM 04/18/2021    8:26 AM  Fall Risk   Falls in the past year? 0 0 0 0 0  Number falls in past yr:  0 0  0  Injury with Fall?  0 0  0  Risk for fall due to : No Fall Risks No Fall Risks No Fall Risks    Follow up Falls prevention  discussed;Falls evaluation completed;Education provided Falls prevention discussed Falls prevention discussed Falls prevention discussed       Functional Status Survey: Is the patient deaf or have difficulty hearing?: No Does the patient have difficulty seeing, even when wearing glasses/contacts?: No Does the patient have difficulty concentrating, remembering, or making decisions?: No Does the patient have difficulty walking or climbing stairs?: No Does the patient have difficulty dressing or bathing?: No Does the patient have difficulty doing errands alone such as visiting a doctor's office or shopping?: No    Assessment & Plan  1. Atherosclerosis of aorta (HCC)  - Lipid panel - COMPLETE METABOLIC PANEL WITH GFR - ezetimibe (ZETIA) 10 MG tablet; Take 1 tablet (10 mg total) by mouth daily.  Dispense: 90 tablet; Refill: 3  2. Secondary hyperparathyroidism ([place])  Under the care of nephrologist   3. Stage 3b chronic kidney disease (HCC)  - COMPLETE METABOLIC PANEL WITH GFR - losartan-hydrochlorothiazide (HYZAAR) 100-12.5 MG tablet; Take 1 tablet by mouth daily.  Dispense: 90 tablet; Refill: 1  4. Essential hypertension  - COMPLETE METABOLIC PANEL WITH GFR - losartan-hydrochlorothiazide (HYZAAR) 100-12.5 MG tablet; Take 1 tablet by mouth daily.  Dispense: 90 tablet; Refill: 1  5. Dyslipidemia  - Lipid panel  6. B12 deficiency  Continue B12 injection from hematologist   7. Vitamin D deficiency  Continues supplementation  8. MGUS (monoclonal gammopathy of unknown significance)  Compliant with follow ups with hematologist   9. Thrombocytopathia ([place])    Monitored by hematologist

## 2022-10-19 ENCOUNTER — Encounter: Payer: Self-pay | Admitting: Family Medicine

## 2022-10-19 ENCOUNTER — Ambulatory Visit: Payer: Medicare PPO | Admitting: Family Medicine

## 2022-10-19 VITALS — BP 132/80 | HR 63 | Temp 97.8°F | Resp 16 | Ht 65.0 in | Wt 164.8 lb

## 2022-10-19 DIAGNOSIS — D472 Monoclonal gammopathy: Secondary | ICD-10-CM

## 2022-10-19 DIAGNOSIS — E538 Deficiency of other specified B group vitamins: Secondary | ICD-10-CM | POA: Diagnosis not present

## 2022-10-19 DIAGNOSIS — D691 Qualitative platelet defects: Secondary | ICD-10-CM | POA: Diagnosis not present

## 2022-10-19 DIAGNOSIS — N1832 Chronic kidney disease, stage 3b: Secondary | ICD-10-CM | POA: Diagnosis not present

## 2022-10-19 DIAGNOSIS — I10 Essential (primary) hypertension: Secondary | ICD-10-CM | POA: Diagnosis not present

## 2022-10-19 DIAGNOSIS — I700 Atherosclerosis of aorta: Secondary | ICD-10-CM | POA: Diagnosis not present

## 2022-10-19 DIAGNOSIS — E785 Hyperlipidemia, unspecified: Secondary | ICD-10-CM | POA: Diagnosis not present

## 2022-10-19 DIAGNOSIS — N2581 Secondary hyperparathyroidism of renal origin: Secondary | ICD-10-CM | POA: Diagnosis not present

## 2022-10-19 DIAGNOSIS — E559 Vitamin D deficiency, unspecified: Secondary | ICD-10-CM | POA: Diagnosis not present

## 2022-10-19 MED ORDER — EZETIMIBE 10 MG PO TABS: 10.0000 mg | ORAL_TABLET | Freq: Every day | ORAL | 3 refills | Status: DC

## 2022-10-19 MED ORDER — LOSARTAN POTASSIUM-HCTZ 100-12.5 MG PO TABS: 1.0000 | ORAL_TABLET | Freq: Every day | ORAL | 1 refills | Status: DC

## 2022-10-20 LAB — COMPLETE METABOLIC PANEL WITH GFR
AG Ratio: 1.3 (calc) (ref 1.0–2.5)
ALT: 13 U/L (ref 6–29)
AST: 21 U/L (ref 10–35)
Albumin: 4 g/dL (ref 3.6–5.1)
Alkaline phosphatase (APISO): 49 U/L (ref 37–153)
BUN/Creatinine Ratio: 16 (calc) (ref 6–22)
BUN: 23 mg/dL (ref 7–25)
CO2: 30 mmol/L (ref 20–32)
Calcium: 10 mg/dL (ref 8.6–10.4)
Chloride: 103 mmol/L (ref 98–110)
Creat: 1.41 mg/dL — ABNORMAL HIGH (ref 0.60–1.00)
Globulin: 3.1 g/dL (calc) (ref 1.9–3.7)
Glucose, Bld: 93 mg/dL (ref 65–99)
Potassium: 4.3 mmol/L (ref 3.5–5.3)
Sodium: 140 mmol/L (ref 135–146)
Total Bilirubin: 0.5 mg/dL (ref 0.2–1.2)
Total Protein: 7.1 g/dL (ref 6.1–8.1)
eGFR: 39 mL/min/{1.73_m2} — ABNORMAL LOW (ref >=60)

## 2022-10-20 LAB — LIPID PANEL
Cholesterol: 204 mg/dL — ABNORMAL HIGH (ref <200)
HDL: 50 mg/dL (ref >=50)
LDL Cholesterol (Calc): 131 mg/dL (calc) — ABNORMAL HIGH
Non-HDL Cholesterol (Calc): 154 mg/dL (calc) — ABNORMAL HIGH (ref <130)
Total CHOL/HDL Ratio: 4.1 (calc) (ref <5.0)
Triglycerides: 122 mg/dL (ref <150)

## 2022-10-24 ENCOUNTER — Ambulatory Visit: Payer: Self-pay

## 2022-10-24 DIAGNOSIS — N1832 Chronic kidney disease, stage 3b: Secondary | ICD-10-CM | POA: Diagnosis not present

## 2022-10-24 NOTE — Telephone Encounter (Signed)
Chief Complaint: Abdominal pain Symptoms: Rate 7-8/10, nausea/vomiting 25 mins after eating, no pain at present time Frequency: Onset Sunday and happens after eating Pertinent Negatives: Patient denies pain at present, other symptoms Disposition: '[]'$ ED /'[]'$ Urgent Care (no appt availability in office) / '[x]'$ Appointment(In office/virtual)/ '[]'$  [facility]/ '[]'$ Home Care/ '[]'$ Refused Recommended Disposition /'[]'$ [facility] [facility]/ '[]'$  Follow-up with PCP Additional Notes: No availability with PCP, scheduled with Dr. Rosana Berger tomorrow.   Reason for Disposition  [1] MODERATE pain (e.g., interferes with normal activities) AND [2] pain comes and goes (cramps) AND [3] present > 24 hours  (Exception: Pain with Vomiting or Diarrhea - see that Guideline.)  Answer Assessment - Initial Assessment Questions 1. LOCATION: "Where does it hurt?"      Middle and lower abdomen 2. RADIATION: "Does the pain shoot anywhere else?" (e.g., chest, back)     No 3. ONSET: "When did the pain begin?" (e.g., minutes, hours or days ago)      Sunday 4. SUDDEN: "Gradual or sudden onset?"     Gradual 5. PATTERN "Does the pain come and go, or is it constant?"    - If it comes and goes: "How long does it last?" "Do you have pain now?"     (Note: Comes and goes means the pain is intermittent. It goes away completely between bouts.)    - If constant: "Is it getting better, staying the same, or getting worse?"      (Note: Constant means the pain never goes away completely; most serious pain is constant and gets worse.)      Eat get nauseated and vomits, then pain goes away 6. SEVERITY: "How bad is the pain?"  (e.g., Scale 1-10; mild, moderate, or severe)    - MILD (1-3): Doesn't interfere with normal activities, abdomen soft and not tender to touch.     - MODERATE (4-7): Interferes with normal activities or awakens from sleep, abdomen tender to touch.     - SEVERE (8-10): Excruciating pain, doubled over, unable to do any  normal activities.       7-8, no pain at this time 7. RECURRENT SYMPTOM: "Have you ever had this type of stomach pain before?" If Yes, ask: "When was the last time?" and "What happened that time?"      Yes, has diverticulitis 8. CAUSE: "What do you think is causing the stomach pain?"     Diverticulitis 9. RELIEVING/AGGRAVATING FACTORS: "What makes it better or worse?" (e.g., antacids, bending or twisting motion, bowel movement)     Eating causes the pain 10. OTHER SYMPTOMS: "Do you have any other symptoms?" (e.g., back pain, diarrhea, fever, urination pain, vomiting)       Vomiting after eating  Protocols used: Abdominal Pain - [facility]

## 2022-10-25 ENCOUNTER — Ambulatory Visit: Payer: Medicare PPO | Admitting: Internal Medicine

## 2022-10-25 ENCOUNTER — Encounter: Payer: Self-pay | Admitting: Internal Medicine

## 2022-10-25 VITALS — BP 122/64 | HR 80 | Temp 98.3°F | Resp 18 | Ht 65.0 in | Wt 162.9 lb

## 2022-10-25 DIAGNOSIS — R1084 Generalized abdominal pain: Secondary | ICD-10-CM

## 2022-10-25 DIAGNOSIS — R112 Nausea with vomiting, unspecified: Secondary | ICD-10-CM

## 2022-10-25 DIAGNOSIS — N1832 Chronic kidney disease, stage 3b: Secondary | ICD-10-CM | POA: Diagnosis not present

## 2022-10-25 DIAGNOSIS — I10 Essential (primary) hypertension: Secondary | ICD-10-CM | POA: Diagnosis not present

## 2022-10-25 DIAGNOSIS — R809 Proteinuria, unspecified: Secondary | ICD-10-CM | POA: Diagnosis not present

## 2022-10-25 DIAGNOSIS — N2581 Secondary hyperparathyroidism of renal origin: Secondary | ICD-10-CM | POA: Diagnosis not present

## 2022-10-25 MED ORDER — ONDANSETRON HCL 4 MG PO TABS: 4.0000 mg | ORAL_TABLET | Freq: Three times a day (TID) | ORAL | 0 refills | Status: DC | PRN

## 2022-10-25 MED ORDER — AMOXICILLIN-POT CLAVULANATE 875-125 MG PO TABS: 1.0000 | ORAL_TABLET | Freq: Three times a day (TID) | ORAL | 0 refills | Status: AC

## 2022-10-25 NOTE — Progress Notes (Signed)
Acute Office Visit  Subjective:     Patient ID: Morgan Livingston, female    DOB: 25-Nov-1945, 76 y.o.   MRN: [id number]  Chief Complaint  Patient presents with   Abdominal Pain    Abdominal cramping w/ nausea and vomiting since sunday    HPI Patient is in today for abdominal pain. She does have a history of diverticulitis, last flare almost 2 years ago.  Abdominal Pain: -Duration: 5 days  -Onset: gradual -Severity: moderate -Quality: cramping -Location:  epigastric  Episode duration:  -Radiation: no -Frequency: intermittent -Alleviating factors: Vomiting  -Aggravating factors: Eating  -Status: better -Treatments attempted: none -Fever: no -Nausea: yes -Vomiting: yes -Weight loss: no -Decreased appetite: no -Diarrhea: no -Constipation: no -Blood in stool: no -Heartburn: no -Dysuria/urinary frequency: no -Hematuria: no -Recurrent NSAID use: no   Review of Systems  Constitutional:  Negative for chills and fever.  Respiratory:  Negative for shortness of breath.   Cardiovascular:  Negative for chest pain.  Gastrointestinal:  Positive for abdominal pain, nausea and vomiting. Negative for blood in stool, constipation, diarrhea and melena.  Genitourinary:  Negative for dysuria and hematuria.        Objective:    BP 122/64   Pulse 80   Temp 98.3 F (36.8 C)   Resp 18   Ht '5\' 5"'$  (1.651 m)   Wt 162 lb 14.4 oz (73.9 kg)   SpO2 98%   BMI 27.11 kg/m  BP Readings from Last 3 Encounters:  10/25/22 122/64  10/19/22 132/80  08/02/22 (!) 148/83   Wt Readings from Last 3 Encounters:  10/25/22 162 lb 14.4 oz (73.9 kg)  10/19/22 164 lb 12.8 oz (74.8 kg)  08/02/22 168 lb 11.2 oz (76.5 kg)      Physical Exam Constitutional:      Appearance: Normal appearance. She is well-developed.  HENT:     Head: Normocephalic and atraumatic.  Eyes:     Conjunctiva/sclera: Conjunctivae normal.  Cardiovascular:     Rate and Rhythm: Normal rate and regular rhythm.   Pulmonary:     Effort: Pulmonary effort is normal.     Breath sounds: Normal breath sounds.  Abdominal:     General: Bowel sounds are normal. There is no distension.     Palpations: There is no mass.     Tenderness: There is abdominal tenderness. There is no right CVA tenderness, left CVA tenderness, guarding or rebound.     Hernia: No hernia is present.     Comments: Epigastric pain to palpation and mild LLQ pain to palpation  Musculoskeletal:     Right lower leg: No edema.     Left lower leg: No edema.  Skin:    General: Skin is warm and dry.  Neurological:     General: No focal deficit present.     Mental Status: She is alert. Mental status is at baseline.  Psychiatric:        Mood and Affect: Mood normal.        Behavior: Behavior normal.     No results found for any visits on 10/25/22.      Assessment & Plan:   1. Generalized abdominal pain/Nausea and vomiting, unspecified vomiting type: At this point nausea and vomiting symptoms improving, consistent with viral illness but she does have a history of diverticulitis and had mild pain in the LLQ on exam. Will send Zofran to use as needed for nausea, rest and eat bland foods but a course of Augmentin  was sent to pharmacy as well if symptoms worsen over the holiday.   - amoxicillin-clavulanate (AUGMENTIN) 875-125 MG tablet; Take 1 tablet by mouth in the morning, at noon, and at bedtime for 5 days.  Dispense: 15 tablet; Refill: 0 - ondansetron (ZOFRAN) 4 MG tablet; Take 1 tablet (4 mg total) by mouth every 8 (eight) hours as needed for nausea or vomiting.  Dispense: 20 tablet; Refill: 0   Return if symptoms worsen or fail to improve.  Teodora Medici, DO

## 2022-10-25 NOTE — Patient Instructions (Signed)
It was great seeing you today!  Plan discussed at today's visit: -Zofran sent to pharmacy -Augmentin sent as well to use only if symptoms get worse  Follow up in: as needed  Take care and let us know if you have any questions or concerns prior to your next visit.  Dr. Rosana Berger

## 2022-10-26 ENCOUNTER — Telehealth: Payer: Self-pay | Admitting: Family Medicine

## 2022-10-26 NOTE — Telephone Encounter (Signed)
Jasmine with [facility] calling to verify Augmentin prescription. Verbalizes understanding.

## 2022-10-31 ENCOUNTER — Inpatient Hospital Stay: Payer: Medicare PPO | Attending: Oncology

## 2022-10-31 DIAGNOSIS — E538 Deficiency of other specified B group vitamins: Secondary | ICD-10-CM | POA: Insufficient documentation

## 2022-10-31 MED ORDER — CYANOCOBALAMIN 1000 MCG/ML IJ SOLN
1000.0000 ug | Freq: Once | INTRAMUSCULAR | Status: AC
  Administered 2022-10-31: 1000 ug via INTRAMUSCULAR
  Filled 2022-10-31: qty 1

## 2022-11-14 ENCOUNTER — Other Ambulatory Visit: Payer: Self-pay | Admitting: Family Medicine

## 2022-11-14 DIAGNOSIS — Z1231 Encounter for screening mammogram for malignant neoplasm of breast: Secondary | ICD-10-CM

## 2022-11-27 NOTE — Progress Notes (Unsigned)
Name: Morgan Livingston   MRN: [id number]    DOB: 12-11-1945   Date:11/28/2022       Progress Note  Subjective  Chief Complaint  Vaginal Bump  HPI  Patient cooked all afternoon on Saturday , picking up heavy dishes, squatting. At night when she went to the bathroom to void she felt something going down from her vagina, she asked her husband to take a look and he said there was something sticking out . She took a showed and rested with her legs elevated, she states feels some discomfort when she voids but no longer can feel the bulging. She is status post hysterectomy , denies dysuria or hematuria. She has normal bowel movements twice daily    Patient Active Problem List   Diagnosis Date Noted   Anemia in stage 3b chronic kidney disease ([place]) 08/02/2022   Thrombocytopenia ([place]) 08/02/2022   Thrombocytopathia ([place]) 04/20/2022   History of IBS 06/03/2020   Mass of arm, left    MGUS (monoclonal gammopathy of unknown significance) 09/01/2018   Secondary hyperparathyroidism ([place]) 09/01/2018   B12 deficiency 08/19/2018   Atherosclerosis of aorta ([place]) 07/25/2018   Proteinuria 05/09/2018   Positive ANA (antinuclear antibody) 04/02/2018   Bilateral dry eyes 04/02/2018   History of hysterectomy 05/23/2015   Stage 3b chronic kidney disease ([place]) 05/23/2015   Essential hypertension 05/22/2015   Chronic constipation 05/22/2015   DDD (degenerative disc disease), lumbar 05/22/2015   DD (diverticular disease) 05/22/2015   Dyslipidemia 05/22/2015   History of cervical cancer 48/18/5631   Helicobacter pylori gastrointestinal tract infection 05/22/2015   Blood glucose elevated 05/22/2015   Overweight (BMI 25.0-29.9) 05/22/2015   Perennial allergic rhinitis with seasonal variation 05/22/2015   Vitamin D deficiency 05/22/2015   Impingement syndrome of shoulder 01/14/2015    Past Surgical History:  Procedure Laterality Date   ABDOMINAL HYSTERECTOMY     BREAST BIOPSY Right 04/09/2006   negative    BREAST BIOPSY Left 12/04/2017   fibroadenmatoid change with coarse calcs   COLONOSCOPY WITH PROPOFOL N/A 10/07/2018   Procedure: COLONOSCOPY WITH PROPOFOL;  Surgeon: [name], MD;  Location: [facility] ENDOSCOPY;  Service: Gastroenterology;  Laterality: N/A;   COLONOSCOPY WITH PROPOFOL N/A 11/21/2021   Procedure: COLONOSCOPY WITH PROPOFOL;  Surgeon: [name], MD;  Location: [facility] ENDOSCOPY;  Service: Gastroenterology;  Laterality: N/A;   LIPOMA EXCISION Left 04/15/2020   Procedure: EXCISION LIPOMA, left arm;  Surgeon: Fredirick Maudlin, MD;  Location: [facility] ORS;  Service: General;  Laterality: Left;   XI ROBOTIC LAPAROSCOPIC ASSISTED APPENDECTOMY N/A 11/28/2021   Procedure: XI ROBOTIC LAPAROSCOPIC ASSISTED APPENDECTOMY;  Surgeon: Herbert Pun, MD;  Location: [facility] ORS;  Service: General;  Laterality: N/A;    Family History  Problem Relation Age of Onset   Congestive Heart Failure Mother 4   Heart attack Father 55   Sinusitis Sister    Stomach cancer Sister    Uterine cancer Sister 34   Liver cancer Sister    Pancreatic cancer Sister    Dementia Sister        95   Heart attack Son    Breast cancer Neg Hx     Social History   Tobacco Use   Smoking status: Former    Packs/day: 1.00    Years: 2.00    Total pack years: 2.00    Types: Cigarettes    Quit date: 1963    Years since quitting: 61.1   Smokeless tobacco: Never   Tobacco comments:  smoking cessation materials not required  Substance Use Topics   Alcohol use: No    Alcohol/week: 0.0 standard drinks of alcohol     Current Outpatient Medications:    acetaminophen (TYLENOL) 500 MG tablet, Take 2 tablets (1,000 mg total) by mouth every 6 (six) hours as needed for mild pain or headache., Disp: 30 tablet, Rfl: 0   aspirin 81 MG tablet, Take 81 mg by mouth daily., Disp: , Rfl:    Biotin 5 MG CAPS, Take by mouth daily., Disp: , Rfl:    carboxymethylcellulose (REFRESH PLUS) 0.5 % SOLN, Place 1 drop into both eyes  daily., Disp: , Rfl:    cholecalciferol (VITAMIN D) 1000 UNITS tablet, Take 1,000 Units by mouth daily. , Disp: , Rfl:    ezetimibe (ZETIA) 10 MG tablet, Take 1 tablet (10 mg total) by mouth daily., Disp: 90 tablet, Rfl: 3   ferrous sulfate 324 MG TBEC, Take 65 mg by mouth daily with breakfast., Disp: , Rfl:    fexofenadine (ALLEGRA) 180 MG tablet, Take 180 mg by mouth daily., Disp: , Rfl:    losartan-hydrochlorothiazide (HYZAAR) 100-12.5 MG tablet, Take 1 tablet by mouth daily., Disp: 90 tablet, Rfl: 1   ondansetron (ZOFRAN) 4 MG tablet, Take 1 tablet (4 mg total) by mouth every 8 (eight) hours as needed for nausea or vomiting., Disp: 20 tablet, Rfl: 0   rosuvastatin (CRESTOR) 20 MG tablet, TAKE 1 TABLET BY MOUTH EVERYDAY AT BEDTIME, Disp: 90 tablet, Rfl: 3   vitamin B-12 (CYANOCOBALAMIN) 1000 MCG tablet, Take 1 tablet (1,000 mcg total) by mouth daily. (Patient taking differently: Take 1,000 mcg by mouth once a week. Wednesday), Disp: 90 tablet, Rfl: 1   vitamin E 1000 UNIT capsule, Take 1,000 Units by mouth daily., Disp: , Rfl:   No Known Allergies  I personally reviewed active problem list, medication list, allergies, family history, social history, health maintenance with the patient/caregiver today.   ROS  Ten systems reviewed and is negative except as mentioned in HPI   Objective  Vitals:   11/28/22 1427  BP: 118/72  Pulse: 62  Resp: 14  Temp: 97.8 F (36.6 C)  TempSrc: Oral  SpO2: 99%  Weight: 165 lb 9.6 oz (75.1 kg)  Height: '5\' 5"'$  (1.651 m)    Body mass index is 27.56 kg/m.  Physical Exam  Constitutional: Patient appears well-developed and well-nourished.  No distress.  HEENT: head atraumatic, normocephalic, pupils equal and reactive to light, neck supple Cardiovascular: Normal rate, regular rhythm and normal heart sounds.  No murmur heard. No BLE edema. Pulmonary/Chest: Effort normal and breath sounds normal. No respiratory distress. Abdominal: Soft.  There is no  tenderness. Pelvic: mild urethral prolapse, cystocele and rectocele present, cervix absent , normal external genitalia, skin tag on anus  Psychiatric: Patient has a normal mood and affect. behavior is normal. Judgment and thought content normal.   Recent Results (from the past 2160 hour(s))  Lipid panel     Status: Abnormal   Collection Time: 10/19/22 10:30 AM  Result Value Ref Range   Cholesterol 204 (H) <200 mg/dL   HDL 50 > OR = 50 mg/dL   Triglycerides 122 <150 mg/dL   LDL Cholesterol (Calc) 131 (H) mg/dL (calc)    Comment: Reference range: <100 . Desirable range <100 mg/dL for primary prevention;   <70 mg/dL for patients with CHD or diabetic patients  with > or = 2 CHD risk factors. Marland Kitchen LDL-C is now calculated using the Martin-Hopkins  calculation,  which is a validated novel method providing  better accuracy than the Friedewald equation in the  estimation of LDL-C.  Cresenciano Genre et al. Annamaria Helling. 4259;563(87): 2061-2068  (http://education.[facility].com/faq/FAQ164)    Total CHOL/HDL Ratio 4.1 <5.0 (calc)   Non-HDL Cholesterol (Calc) 154 (H) <130 mg/dL (calc)    Comment: For patients with diabetes plus 1 major ASCVD risk  factor, treating to a non-HDL-C goal of <100 mg/dL  (LDL-C of <70 mg/dL) is considered a therapeutic  option.   COMPLETE METABOLIC PANEL WITH GFR     Status: Abnormal   Collection Time: 10/19/22 10:30 AM  Result Value Ref Range   Glucose, Bld 93 65 - 99 mg/dL    Comment: .            Fasting reference interval .    BUN 23 7 - 25 mg/dL   Creat 1.41 (H) 0.60 - 1.00 mg/dL   eGFR 39 (L) > OR = 60 mL/min/1.88m   BUN/Creatinine Ratio 16 6 - 22 (calc)   Sodium 140 135 - 146 mmol/L   Potassium 4.3 3.5 - 5.3 mmol/L   Chloride 103 98 - 110 mmol/L   CO2 30 20 - 32 mmol/L   Calcium 10.0 8.6 - 10.4 mg/dL   Total Protein 7.1 6.1 - 8.1 g/dL   Albumin 4.0 3.6 - 5.1 g/dL   Globulin 3.1 1.9 - 3.7 g/dL (calc)   AG Ratio 1.3 1.0 - 2.5 (calc)   Total Bilirubin 0.5  0.2 - 1.2 mg/dL   Alkaline phosphatase (APISO) 49 37 - 153 U/L   AST 21 10 - 35 U/L   ALT 13 6 - 29 U/L    PHQ2/9:    11/28/2022    2:29 PM 10/25/2022    2:55 PM 10/19/2022    9:25 AM 04/20/2022    8:10 AM 01/02/2022    9:34 AM  Depression screen PHQ 2/9  Decreased Interest 0 0 0 0 0  Down, Depressed, Hopeless 0 0 0 0 0  PHQ - 2 Score 0 0 0 0 0  Altered sleeping 0 0 0 0   Tired, decreased energy 0 0 0 0   Change in appetite 0 0 0 0   Feeling bad or failure about yourself  0 0 0 0   Trouble concentrating 0 0 0 0   Moving slowly or fidgety/restless 0 0 0 0   Suicidal thoughts 0 0 0 0   PHQ-9 Score 0 0 0 0   Difficult doing work/chores  Not difficult at all       phq 9 is negative   Fall Risk:    11/28/2022    2:29 PM 10/25/2022    2:50 PM 10/19/2022    9:25 AM 04/20/2022    8:10 AM 01/02/2022    9:37 AM  Fall Risk   Falls in the past year? 0 0 0 0 0  Number falls in past yr:  0  0 0  Injury with Fall?  0  0 0  Risk for fall due to : No Fall Risks  No Fall Risks No Fall Risks No Fall Risks  Follow up Falls prevention discussed  Falls prevention discussed;Falls evaluation completed;Education provided Falls prevention discussed Falls prevention discussed      Functional Status Survey: Is the patient deaf or have difficulty hearing?: No Does the patient have difficulty seeing, even when wearing glasses/contacts?: No Does the patient have difficulty concentrating, remembering, or making decisions?: No Does the patient have  difficulty walking or climbing stairs?: No Does the patient have difficulty dressing or bathing?: No Does the patient have difficulty doing errands alone such as visiting a doctor's office or shopping?: No    Assessment & Plan  1. Cystocele with rectocele  Seems like episode was triggered by activity, explained treatment is surgical and we can make a referral if recurrence or if she starts to have symptoms of urinary incontinence and or difficulty  having bowel movements

## 2022-11-28 ENCOUNTER — Ambulatory Visit: Payer: Medicare PPO | Admitting: Family Medicine

## 2022-11-28 ENCOUNTER — Encounter: Payer: Self-pay | Admitting: Family Medicine

## 2022-11-28 VITALS — BP 118/72 | HR 62 | Temp 97.8°F | Resp 14 | Ht 65.0 in | Wt 165.6 lb

## 2022-11-28 DIAGNOSIS — N816 Rectocele: Secondary | ICD-10-CM

## 2022-11-28 DIAGNOSIS — N8110 Cystocele, unspecified: Secondary | ICD-10-CM | POA: Diagnosis not present

## 2022-11-30 ENCOUNTER — Inpatient Hospital Stay: Payer: Medicare PPO | Attending: Oncology

## 2022-11-30 DIAGNOSIS — E538 Deficiency of other specified B group vitamins: Secondary | ICD-10-CM | POA: Diagnosis not present

## 2022-11-30 MED ORDER — CYANOCOBALAMIN 1000 MCG/ML IJ SOLN
1000.0000 ug | Freq: Once | INTRAMUSCULAR | Status: AC
  Administered 2022-11-30: 1000 ug via INTRAMUSCULAR
  Filled 2022-11-30: qty 1

## 2022-12-21 ENCOUNTER — Ambulatory Visit
Admission: RE | Admit: 2022-12-21 | Discharge: 2022-12-21 | Disposition: A | Discharge status: Home / Self Care | Payer: Medicare PPO | Source: Ambulatory Visit | Attending: Family Medicine | Admitting: Family Medicine

## 2022-12-21 DIAGNOSIS — Z1231 Encounter for screening mammogram for malignant neoplasm of breast: Secondary | ICD-10-CM | POA: Diagnosis not present

## 2023-01-01 ENCOUNTER — Inpatient Hospital Stay: Payer: Medicare PPO | Attending: Oncology

## 2023-01-01 DIAGNOSIS — E538 Deficiency of other specified B group vitamins: Secondary | ICD-10-CM | POA: Diagnosis not present

## 2023-01-01 MED ORDER — CYANOCOBALAMIN 1000 MCG/ML IJ SOLN
1000.0000 ug | Freq: Once | INTRAMUSCULAR | Status: AC
  Administered 2023-01-01: 1000 ug via INTRAMUSCULAR
  Filled 2023-01-01: qty 1

## 2023-01-03 ENCOUNTER — Ambulatory Visit (INDEPENDENT_AMBULATORY_CARE_PROVIDER_SITE_OTHER): Payer: Medicare PPO

## 2023-01-03 VITALS — BP 137/79 | Ht 65.0 in | Wt 165.0 lb

## 2023-01-03 DIAGNOSIS — Z0000 Encounter for general adult medical examination without abnormal findings: Secondary | ICD-10-CM | POA: Diagnosis not present

## 2023-01-03 NOTE — Patient Instructions (Signed)
Morgan Livingston , Thank you for taking time to come for your Medicare Wellness Visit. I appreciate your ongoing commitment to your health goals. Please review the following plan we discussed and let me know if I can assist you in the future.   These are the goals we discussed:  Goals      DIET - INCREASE WATER INTAKE     Recommend to drink at least 6-8 8oz glasses of water per day.     Weight (lb) < 165 lb (74.8 kg)     Pt states she would like to lose 8 lbs over the next few months        This is a list of the screening recommended for you and due dates:  Health Maintenance  Topic Date Due   COVID-19 Vaccine (6 - 2023-24 season) 10/16/2022   Mammogram  12/22/2023   Medicare Annual Wellness Visit  01/03/2024   Colon Cancer Screening  11/21/2024   DTaP/Tdap/Td vaccine (2 - Td or Tdap) 05/15/2027   Pneumonia Vaccine  Completed   Flu Shot  Completed   DEXA scan (bone density measurement)  Completed   Hepatitis C Screening: USPSTF Recommendation to screen - Ages 63-79 yo.  Completed   Zoster (Shingles) Vaccine  Completed   HPV Vaccine  Aged Out    Advanced directives: no  Conditions/risks identified: non  Next appointment: Follow up in one year for your annual wellness visit 01/09/2024 @ 9:15am telephone   Preventive Care 65 Years and Older, Female Preventive care refers to lifestyle choices and visits with your health care provider that can promote health and wellness. What does preventive care include? A yearly physical exam. This is also called an annual well check. Dental exams once or twice a year. Routine eye exams. Ask your health care provider how often you should have your eyes checked. Personal lifestyle choices, including: Daily care of your teeth and gums. Regular physical activity. Eating a healthy diet. Avoiding tobacco and drug use. Limiting alcohol use. Practicing safe sex. Taking low-dose aspirin every day. Taking vitamin and mineral supplements as  recommended by your health care provider. What happens during an annual well check? The services and screenings done by your health care provider during your annual well check will depend on your age, overall health, lifestyle risk factors, and family history of disease. Counseling  Your health care provider may ask you questions about your: Alcohol use. Tobacco use. Drug use. Emotional well-being. Home and relationship well-being. Sexual activity. Eating habits. History of falls. Memory and ability to understand (cognition). Work and work Statistician. Reproductive health. Screening  You may have the following tests or measurements: Height, weight, and BMI. Blood pressure. Lipid and cholesterol levels. These may be checked every 5 years, or more frequently if you are over 34 years old. Skin check. Lung cancer screening. You may have this screening every year starting at age 32 if you have a 30-pack-year history of smoking and currently smoke or have quit within the past 15 years. Fecal occult blood test (FOBT) of the stool. You may have this test every year starting at age 24. Flexible sigmoidoscopy or colonoscopy. You may have a sigmoidoscopy every 5 years or a colonoscopy every 10 years starting at age 41. Hepatitis C blood test. Hepatitis B blood test. Sexually transmitted disease (STD) testing. Diabetes screening. This is done by checking your blood sugar (glucose) after you have not eaten for a while (fasting). You may have this done every 1-3 years.  Bone density scan. This is done to screen for osteoporosis. You may have this done starting at age 29. Mammogram. This may be done every 1-2 years. Talk to your health care provider about how often you should have regular mammograms. Talk with your health care provider about your test results, treatment options, and if necessary, the need for more tests. Vaccines  Your health care provider may recommend certain vaccines, such  as: Influenza vaccine. This is recommended every year. Tetanus, diphtheria, and acellular pertussis (Tdap, Td) vaccine. You may need a Td booster every 10 years. Zoster vaccine. You may need this after age 51. Pneumococcal 13-valent conjugate (PCV13) vaccine. One dose is recommended after age 83. Pneumococcal polysaccharide (PPSV23) vaccine. One dose is recommended after age 34. Talk to your health care provider about which screenings and vaccines you need and how often you need them. This information is not intended to replace advice given to you by your health care provider. Make sure you discuss any questions you have with your health care provider. Document Released: 11/18/2015 Document Revised: 07/11/2016 Document Reviewed: 08/23/2015 Elsevier Interactive Patient Education  2017 [place] Prevention in the Home Falls can cause injuries. They can happen to people of all ages. There are many things you can do to make your home safe and to help prevent falls. What can I do on the outside of my home? Regularly fix the edges of walkways and driveways and fix any cracks. Remove anything that might make you trip as you walk through a door, such as a raised step or threshold. Trim any bushes or trees on the path to your home. Use bright outdoor lighting. Clear any walking paths of anything that might make someone trip, such as rocks or tools. Regularly check to see if handrails are loose or broken. Make sure that both sides of any steps have handrails. Any raised decks and porches should have guardrails on the edges. Have any leaves, snow, or ice cleared regularly. Use sand or salt on walking paths during winter. Clean up any spills in your garage right away. This includes oil or grease spills. What can I do in the bathroom? Use night lights. Install grab bars by the toilet and in the tub and shower. Do not use towel bars as grab bars. Use non-skid mats or decals in the tub or  shower. If you need to sit down in the shower, use a plastic, non-slip stool. Keep the floor dry. Clean up any water that spills on the floor as soon as it happens. Remove soap buildup in the tub or shower regularly. Attach bath mats securely with double-sided non-slip rug tape. Do not have throw rugs and other things on the floor that can make you trip. What can I do in the bedroom? Use night lights. Make sure that you have a light by your bed that is easy to reach. Do not use any sheets or blankets that are too big for your bed. They should not hang down onto the floor. Have a firm chair that has side arms. You can use this for support while you get dressed. Do not have throw rugs and other things on the floor that can make you trip. What can I do in the kitchen? Clean up any spills right away. Avoid walking on wet floors. Keep items that you use a lot in easy-to-reach places. If you need to reach something above you, use a strong step stool that has a grab bar.  Keep electrical cords out of the way. Do not use floor polish or wax that makes floors slippery. If you must use wax, use non-skid floor wax. Do not have throw rugs and other things on the floor that can make you trip. What can I do with my stairs? Do not leave any items on the stairs. Make sure that there are handrails on both sides of the stairs and use them. Fix handrails that are broken or loose. Make sure that handrails are as long as the stairways. Check any carpeting to make sure that it is firmly attached to the stairs. Fix any carpet that is loose or worn. Avoid having throw rugs at the top or bottom of the stairs. If you do have throw rugs, attach them to the floor with carpet tape. Make sure that you have a light switch at the top of the stairs and the bottom of the stairs. If you do not have them, ask someone to add them for you. What else can I do to help prevent falls? Wear shoes that: Do not have high heels. Have  rubber bottoms. Are comfortable and fit you well. Are closed at the toe. Do not wear sandals. If you use a stepladder: Make sure that it is fully opened. Do not climb a closed stepladder. Make sure that both sides of the stepladder are locked into place. Ask someone to hold it for you, if possible. Clearly mark and make sure that you can see: Any grab bars or handrails. First and last steps. Where the edge of each step is. Use tools that help you move around (mobility aids) if they are needed. These include: Canes. Walkers. Scooters. Crutches. Turn on the lights when you go into a dark area. Replace any light bulbs as soon as they burn out. Set up your furniture so you have a clear path. Avoid moving your furniture around. If any of your floors are uneven, fix them. If there are any pets around you, be aware of where they are. Review your medicines with your doctor. Some medicines can make you feel dizzy. This can increase your chance of falling. Ask your doctor what other things that you can do to help prevent falls. This information is not intended to replace advice given to you by your health care provider. Make sure you discuss any questions you have with your health care provider. Document Released: 08/18/2009 Document Revised: 03/29/2016 Document Reviewed: 11/26/2014 Elsevier Interactive Patient Education  2017 [employer].

## 2023-01-03 NOTE — Progress Notes (Signed)
I connected with  [name] on 01/03/23 by a audio enabled telemedicine application and verified that I am speaking with the correct person using two identifiers.  Patient Location: Home  Provider Location: Office/Clinic  I discussed the limitations of evaluation and management by telemedicine. The patient expressed understanding and agreed to proceed.  Subjective:   Morgan Livingston is a 77 y.o. female who presents for Medicare Annual (Subsequent) preventive examination.  Review of Systems     Cardiac Risk Factors include: advanced age (>74mn, >>57women);dyslipidemia;hypertension;sedentary lifestyle     Objective:    Today's Vitals   01/03/23 0906  BP: 137/79  Weight: 165 lb (74.8 kg)  Height: '5\' 5"'$  (1.651 m)   Body mass index is 27.46 kg/m.     01/03/2023    9:16 AM 08/02/2022    9:53 AM 01/02/2022    9:35 AM 11/28/2021    5:00 PM 11/28/2021   11:21 AM 11/28/2021    7:12 AM 11/21/2021   10:01 AM  Advanced Directives  Does Patient Have a Medical Advance Directive? No No No No No No No  Would patient like information on creating a medical advance directive? Yes (ED - Information included in AVS)  No - Patient declined No - Patient declined No - Patient declined No - Patient declined     Current Medications (verified) Outpatient Encounter Medications as of 01/03/2023  Medication Sig   acetaminophen (TYLENOL) 500 MG tablet Take 2 tablets (1,000 mg total) by mouth every 6 (six) hours as needed for mild pain or headache.   aspirin 81 MG tablet Take 81 mg by mouth daily.   Biotin 5 MG CAPS Take by mouth daily.   carboxymethylcellulose (REFRESH PLUS) 0.5 % SOLN Place 1 drop into both eyes daily.   cholecalciferol (VITAMIN D) 1000 UNITS tablet Take 1,000 Units by mouth daily.    ezetimibe (ZETIA) 10 MG tablet Take 1 tablet (10 mg total) by mouth daily.   ferrous sulfate 324 MG TBEC Take 65 mg by mouth daily with breakfast.   fexofenadine (ALLEGRA) 180 MG tablet Take 180 mg by  mouth daily.   losartan-hydrochlorothiazide (HYZAAR) 100-12.5 MG tablet Take 1 tablet by mouth daily.   rosuvastatin (CRESTOR) 20 MG tablet TAKE 1 TABLET BY MOUTH EVERYDAY AT BEDTIME   vitamin B-12 (CYANOCOBALAMIN) 1000 MCG tablet Take 1 tablet (1,000 mcg total) by mouth daily. (Patient taking differently: Take 1,000 mcg by mouth once a week. Wednesday)   vitamin E 1000 UNIT capsule Take 1,000 Units by mouth daily.   ondansetron (ZOFRAN) 4 MG tablet Take 1 tablet (4 mg total) by mouth every 8 (eight) hours as needed for nausea or vomiting. (Patient not taking: Reported on 01/03/2023)   No facility-administered encounter medications on file as of 01/03/2023.    Allergies (verified) Patient has no known allergies.   History: Past Medical History:  Diagnosis Date   Allergy    [place]    Uterine Cancer   Hyperlipidemia    Hypertension    Kidney stones 06/21/2018   Past Surgical History:  Procedure Laterality Date   ABDOMINAL HYSTERECTOMY     BREAST BIOPSY Right 04/09/2006   negative   BREAST BIOPSY Left 12/04/2017   fibroadenmatoid change with coarse calcs   COLONOSCOPY WITH PROPOFOL N/A 10/07/2018   Procedure: COLONOSCOPY WITH PROPOFOL;  Surgeon: AJonathon Bellows MD;  [facility];  Service: Gastroenterology;  Laterality: N/A;   COLONOSCOPY WITH PROPOFOL N/A 11/21/2021   Procedure: COLONOSCOPY WITH  PROPOFOL;  Surgeon: [name], MD;  [facility];  Service: Gastroenterology;  Laterality: N/A;   LIPOMA EXCISION Left 04/15/2020   Procedure: EXCISION LIPOMA, left arm;  Surgeon: Fredirick Maudlin, MD;  Location: [facility] ORS;  Service: General;  Laterality: Left;   XI ROBOTIC LAPAROSCOPIC ASSISTED APPENDECTOMY N/A 11/28/2021   Procedure: XI ROBOTIC LAPAROSCOPIC ASSISTED APPENDECTOMY;  Surgeon: Herbert Pun, MD;  Location: [facility] ORS;  Service: General;  Laterality: N/A;   Family History  Problem Relation Age of Onset   Congestive Heart Failure Mother 46    Heart attack Father 53   Sinusitis Sister    Stomach cancer Sister    Uterine cancer Sister 19   Liver cancer Sister    Pancreatic cancer Sister    Dementia Sister        44   Heart attack Son    Breast cancer Neg Hx    Social History   Socioeconomic History   Marital status: Married    Spouse name: Junior   Number of children: 2   Years of education: some college   Highest education level: 12th grade  Occupational History   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   Tobacco Use   Smoking status: Former    Packs/day: 1.00    Years: 2.00    Total pack years: 2.00    Types: Cigarettes    Quit date: 1963    Years since quitting: 61.2   Smokeless tobacco: Never   Tobacco comments:    smoking cessation materials not required  Vaping Use   Vaping Use: Never used  Substance and Sexual Activity   Alcohol use: No    Alcohol/week: 0.0 standard drinks of alcohol   Drug use: No   Sexual activity: Yes    Partners: Male    Birth control/protection: Post-menopausal  Other Topics Concern   Not on file  Social History Narrative   Married for 50 years    Social Determinants of Health   Financial Resource Strain: Low Risk  (01/03/2023)   Overall Financial Resource Strain (CARDIA)    Difficulty of Paying Living Expenses: Not hard at all  Food Insecurity: No Food Insecurity (01/03/2023)   Hunger Vital Sign    Worried About Running Out of Food in the Last Year: Never true    Ran Out of Food in the Last Year: Never true  Transportation Needs: No Transportation Needs (01/03/2023)   PRAPARE - Hydrologist (Medical): No    Lack of Transportation (Non-Medical): No  Physical Activity: Inactive (01/03/2023)   Exercise Vital Sign    Days of Exercise per Week: 0 days    Minutes of Exercise per Session: 0 min  Stress: No Stress Concern Present (01/03/2023)   [place]    Feeling of  Stress : Not at all  Social Connections: [place] (01/03/2023)   Social Connection and Isolation Panel [NHANES]    Frequency of Communication with Friends and Family: More than three times a week    Frequency of Social Gatherings with Friends and Family: More than three times a week    Attends Religious Services: More than 4 times per year    Active Member of [employer] or Organizations: Yes    Attends Music therapist: More than 4 times per year    Marital Status: Married    Tobacco Counseling Counseling given: Not Answered Tobacco comments: smoking cessation  materials not required   Clinical Intake:  Pre-visit preparation completed: No  Pain : No/denies pain     BMI - recorded: 27.46 Nutritional Status: BMI 25 -29 Overweight Nutritional Risks: None Diabetes: No  How often do you need to have someone help you when you read instructions, pamphlets, or other written materials from your doctor or pharmacy?: 1 - Never  Diabetic?no  Interpreter Needed?: No  Information entered by :: B.Rucha Wissinger,LPN   Activities of Daily Living    01/03/2023    9:17 AM 11/28/2022    2:30 PM  In your present state of health, do you have any difficulty performing the following activities:  Hearing? 0 0  Vision? 0 0  Difficulty concentrating or making decisions? 0 0  Walking or climbing stairs? 0 0  Dressing or bathing? 0 0  Doing errands, shopping? 0 0  Preparing Food and eating ? N   Using the Toilet? N   In the past six months, have you accidently leaked urine? N   Do you have problems with loss of bowel control? N   Managing your Medications? N   Managing your Finances? N   Housekeeping or managing your Housekeeping? N     Patient Care Team: Steele Sizer, MD as PCP - General (Family Medicine) Earlie Server, MD as Consulting Physician (Oncology) Anthonette Legato, MD (Nephrology) Yolonda Kida, MD as Consulting Physician (Cardiology) [name], MD as  Consulting Physician (Gastroenterology)  Indicate any recent Medical Services you may have received from other than [facility] providers in the past year (date may be approximate).     Assessment:   This is a routine wellness examination for [place].  Hearing/Vision screen Hearing Screening - Comments:: Adequate hearing Vision Screening - Comments:: Adequate vision w/glasses [facility]  Dietary issues and exercise activities discussed: Exercise limited by: neurologic condition(s)   Goals Addressed             This Visit's Progress    DIET - INCREASE WATER INTAKE   On track    Recommend to drink at least 6-8 8oz glasses of water per day.       Depression Screen    01/03/2023    9:12 AM 11/28/2022    2:29 PM 10/25/2022    2:55 PM 10/19/2022    9:25 AM 04/20/2022    8:10 AM 01/02/2022    9:34 AM 10/18/2021    8:31 AM  PHQ 2/9 Scores  PHQ - 2 Score 0 0 0 0 0 0 0  PHQ- 9 Score 0 0 0 0 0      Fall Risk    01/03/2023    9:09 AM 11/28/2022    2:29 PM 10/25/2022    2:50 PM 10/19/2022    9:25 AM 04/20/2022    8:10 AM  Fall Risk   Falls in the past year? 0 0 0 0 0  Number falls in past yr: 0  0  0  Injury with Fall? 0  0  0  Risk for fall due to : No Fall Risks No Fall Risks  No Fall Risks No Fall Risks  Follow up Education provided;Falls prevention discussed Falls prevention discussed  Falls prevention discussed;Falls evaluation completed;Education provided Falls prevention discussed    FALL RISK PREVENTION PERTAINING TO THE HOME:  Any stairs in or around the home? No  If so, are there any without handrails? No  Home free of loose throw rugs in walkways, pet beds, electrical cords, etc? Yes  Adequate lighting in your home to reduce risk of falls? Yes   ASSISTIVE DEVICES UTILIZED TO PREVENT FALLS:  Life alert? No  Use of a cane, walker or w/c? No  Grab bars in the bathroom? Yes  Shower chair or bench in shower? No  Elevated toilet seat or a handicapped toilet? No     Cognitive Function:        01/03/2023    9:20 AM 12/29/2019    9:34 AM 12/23/2018    9:46 AM 12/20/2017   10:44 AM  6CIT Screen  What Year? 0 points 0 points 0 points 0 points  What month? 0 points 0 points 0 points 0 points  What time? 0 points 0 points 0 points 0 points  Count back from 20 0 points 0 points 0 points 0 points  Months in reverse 0 points 0 points 0 points 0 points  Repeat phrase 0 points 2 points 0 points 0 points  Total Score 0 points 2 points 0 points 0 points    Immunizations Immunization History  Administered Date(s) Administered   Fluad Quad(high Dose 65+) 08/28/2019, 07/14/2020, 08/13/2022   Influenza, High Dose Seasonal PF 09/23/2015, 08/22/2016, 08/14/2017, 07/25/2018, 08/09/2021   Influenza-Unspecified 09/20/2014   Moderna Covid-19 Vaccine Bivalent Booster 66yr & up 11/23/2021, 08/21/2022   Moderna Sars-Covid-2 Vaccination 12/16/2019, 01/13/2020, 09/05/2020   Pneumococcal Conjugate-13 01/12/2014   Pneumococcal Polysaccharide-23 12/28/2014   Respiratory Syncytial Virus Vaccine,Recomb Aduvanted(Arexvy) 10/31/2022   Tdap 05/14/2017   Tetanus 02/25/2007   Zoster Recombinat (Shingrix) 11/02/2021, 02/28/2022   Zoster, Live 08/11/2012    TDAP status: Up to date  Flu Vaccine status: Up to date  Pneumococcal vaccine status: Up to date  Covid-19 vaccine status: Completed vaccines  Qualifies for Shingles Vaccine? Yes   Zostavax completed Yes   Shingrix Completed?: Yes  Screening Tests Health Maintenance  Topic Date Due   COVID-19 Vaccine (6 - 2023-24 season) 10/16/2022   MAMMOGRAM  12/22/2023   [place]ic[place] Annual Wellness ([place]  01/03/2024   COLONOSCOPY (Pts 45-437yrInsurance coverage will need to be confirmed)  11/21/2024   DTaP/Tdap/Td (2 - Td or Tdap) 05/15/2027   Pneumonia Vaccine 6538Years old  Completed   INFLUENZA VACCINE  Completed   DEXA SCAN  Completed   Hepatitis C Screening  Completed   Zoster Vaccines- Shingrix  Completed   HPV  VACCINES  Aged Out    Health Maintenance  Health Maintenance Due  Topic Date Due   COVID-19 Vaccine (6 - 2023-24 season) 10/16/2022    Colorectal cancer screening: Type of screening: Colonoscopy. Completed yes. Repeat every 5 years  Mammogram status: Completed yes. Repeat every year  Bone Density status: Completed yes. Results reflect: Bone density results: NORMAL. Repeat every 5 years.  Lung Cancer Screening: (Low Dose CT Chest recommended if Age 76-80ears, 30 pack-year currently smoking OR have quit w/in 15years.) does not qualify.   Lung Cancer Screening Referral: no  Additional Screening:  Hepatitis C Screening: does not qualify; Completed yes in past  Vision Screening: Recommended annual ophthalmology exams for early detection of glaucoma and other disorders of the eye. Is the patient up to date with their annual eye exam?  Yes  Who is the provider or what is the name of the office in which the patient attends annual eye exams? [place] is not established with a provider, would they like to be referred to a provider to establish care? No .   Dental Screening: Recommended annual dental  exams for proper oral hygiene  Community Resource Referral / Chronic Care Management: CRR required this visit?  No   CCM required this visit?  No      Plan:     I have personally reviewed and noted the following in the patient's chart:   Medical and social history Use of alcohol, tobacco or illicit drugs  Current medications and supplements including opioid prescriptions. Patient is not currently taking opioid prescriptions. Functional ability and status Nutritional status Physical activity Advanced directives List of other physicians Hospitalizations, surgeries, and ER visits in previous 12 months Vitals Screenings to include cognitive, depression, and falls Referrals and appointments  In addition, I have reviewed and discussed with patient certain preventive  protocols, quality metrics, and best practice recommendations. A written personalized care plan for preventive services as well as general preventive health recommendations were provided to patient.     Roger Shelter, LPN   [id number]   Nurse Notes: pt is doing well:has no concerns or questions during this visit.

## 2023-01-09 NOTE — Progress Notes (Unsigned)
Name: Morgan Livingston   MRN: [id number]    DOB: [birth year]   Date:01/10/2023       Progress Note  Subjective  Chief Complaint  Follow Up  HPI  Cystocele/Rectocele: patient was seen in January due to acute onset of fullness on her vagina after working in her kitchen all day. On exam she had rectocele and cystocele and at the time symptoms had improved and she opted not to see urologist, she was doing well until this past weekend, and now she feels a fullness inside her vagina constantly and is bothering her, she states she feels a bulge when she wipes after voiding and after a bowel movement. She no bowel or bladder problems No vulva irritation    Patient Active Problem List   Diagnosis Date Noted   Anemia in stage 3b chronic kidney disease ([place]) 08/02/2022   Thrombocytopenia ([place]) 08/02/2022   Thrombocytopathia ([place]) 04/20/2022   History of IBS 06/03/2020   Mass of arm, left    MGUS (monoclonal gammopathy of unknown significance) 09/01/2018   Secondary hyperparathyroidism ([place]) 09/01/2018   B12 deficiency 08/19/2018   Atherosclerosis of aorta ([place]) 07/25/2018   Proteinuria 05/09/2018   Positive ANA (antinuclear antibody) 04/02/2018   Bilateral dry eyes 04/02/2018   History of hysterectomy 05/23/2015   Stage 3b chronic kidney disease ([place]) 05/23/2015   Essential hypertension 05/22/2015   Chronic constipation 05/22/2015   DDD (degenerative disc disease), lumbar 05/22/2015   DD (diverticular disease) 05/22/2015   Dyslipidemia 05/22/2015   History of cervical cancer [id number]   Helicobacter pylori gastrointestinal tract infection 05/22/2015   Blood glucose elevated 05/22/2015   Overweight (BMI 25.0-29.9) 05/22/2015   Perennial allergic rhinitis with seasonal variation 05/22/2015   Vitamin D deficiency 05/22/2015   Impingement syndrome of shoulder 01/14/2015    Past Surgical History:  Procedure Laterality Date   ABDOMINAL HYSTERECTOMY     BREAST BIOPSY Right 04/09/2006    negative   BREAST BIOPSY Left 12/04/2017   fibroadenmatoid change with coarse calcs   COLONOSCOPY WITH PROPOFOL N/A 10/07/2018   Procedure: COLONOSCOPY WITH PROPOFOL;  Surgeon: [name], MD;  Location: [facility] ENDOSCOPY;  Service: Gastroenterology;  Laterality: N/A;   COLONOSCOPY WITH PROPOFOL N/A 11/21/2021   Procedure: COLONOSCOPY WITH PROPOFOL;  Surgeon: [name], MD;  Location: [facility] ENDOSCOPY;  Service: Gastroenterology;  Laterality: N/A;   LIPOMA EXCISION Left 04/15/2020   Procedure: EXCISION LIPOMA, left arm;  Surgeon: Fredirick Maudlin, MD;  Location: [facility] ORS;  Service: General;  Laterality: Left;   XI ROBOTIC LAPAROSCOPIC ASSISTED APPENDECTOMY N/A 11/28/2021   Procedure: XI ROBOTIC LAPAROSCOPIC ASSISTED APPENDECTOMY;  Surgeon: Herbert Pun, MD;  Location: [facility] ORS;  Service: General;  Laterality: N/A;    Family History  Problem Relation Age of Onset   Congestive Heart Failure Mother 79   Heart attack Father 53   Sinusitis Sister    Stomach cancer Sister    Uterine cancer Sister 64   Liver cancer Sister    Pancreatic cancer Sister    Dementia Sister        68   Heart attack Son    Breast cancer Neg Hx     Social History   Tobacco Use   Smoking status: Former    Packs/day: 1.00    Years: 2.00    Total pack years: 2.00    Types: Cigarettes    Quit date: 1963    Years since quitting: 61.2   Smokeless tobacco: Never   Tobacco comments:  smoking cessation materials not required  Substance Use Topics   Alcohol use: No    Alcohol/week: 0.0 standard drinks of alcohol     Current Outpatient Medications:    acetaminophen (TYLENOL) 500 MG tablet, Take 2 tablets (1,000 mg total) by mouth every 6 (six) hours as needed for mild pain or headache., Disp: 30 tablet, Rfl: 0   aspirin 81 MG tablet, Take 81 mg by mouth daily., Disp: , Rfl:    Biotin 5 MG CAPS, Take by mouth daily., Disp: , Rfl:    carboxymethylcellulose (REFRESH PLUS) 0.5 % SOLN, Place 1 drop into  both eyes daily., Disp: , Rfl:    cholecalciferol (VITAMIN D) 1000 UNITS tablet, Take 1,000 Units by mouth daily. , Disp: , Rfl:    ezetimibe (ZETIA) 10 MG tablet, Take 1 tablet (10 mg total) by mouth daily., Disp: 90 tablet, Rfl: 3   ferrous sulfate 324 MG TBEC, Take 65 mg by mouth daily with breakfast., Disp: , Rfl:    fexofenadine (ALLEGRA) 180 MG tablet, Take 180 mg by mouth daily., Disp: , Rfl:    losartan-hydrochlorothiazide (HYZAAR) 100-12.5 MG tablet, Take 1 tablet by mouth daily., Disp: 90 tablet, Rfl: 1   rosuvastatin (CRESTOR) 20 MG tablet, TAKE 1 TABLET BY MOUTH EVERYDAY AT BEDTIME, Disp: 90 tablet, Rfl: 3   vitamin B-12 (CYANOCOBALAMIN) 1000 MCG tablet, Take 1 tablet (1,000 mcg total) by mouth daily. (Patient taking differently: Take 1,000 mcg by mouth once a week. Wednesday), Disp: 90 tablet, Rfl: 1   vitamin E 1000 UNIT capsule, Take 1,000 Units by mouth daily., Disp: , Rfl:    ondansetron (ZOFRAN) 4 MG tablet, Take 1 tablet (4 mg total) by mouth every 8 (eight) hours as needed for nausea or vomiting. (Patient not taking: Reported on 01/03/2023), Disp: 20 tablet, Rfl: 0  No Known Allergies  I personally reviewed active problem list, medication list, allergies, family history, social history, health maintenance with the patient/caregiver today.   ROS  Ten systems reviewed and is negative except as mentioned in HPI  Objective  Vitals:   01/10/23 1037  BP: 118/68  Pulse: 83  Resp: 16  SpO2: 98%  Weight: 165 lb (74.8 kg)  Height: '5\' 5"'$  (1.651 m)    Body mass index is 27.46 kg/m.  Physical Exam  Constitutional: Patient appears well-developed and well-nourished. No distress.  HEENT: head atraumatic, normocephalic, pupils equal and reactive to light, neck supple Cardiovascular: Normal rate, regular rhythm and normal heart sounds.  No murmur heard. No BLE edema. Pulmonary/Chest: Effort normal and breath sounds normal. No respiratory distress. Abdominal: Soft.  There is  no tenderness. FEMALE GENITALIA:  External genitalia normal External urethra mild prolapse  Vaginal walls weak with cystocele and rectocele present Cervix not present  Bimanual exam normal without masses RECTAL: not done  Psychiatric: Patient has a normal mood and affect. behavior is normal. Judgment and thought content normal.   Recent Results (from the past 2160 hour(s))  Lipid panel     Status: Abnormal   Collection Time: 10/19/22 10:30 AM  Result Value Ref Range   Cholesterol 204 (H) <200 mg/dL   HDL 50 > OR = 50 mg/dL   Triglycerides 122 <150 mg/dL   LDL Cholesterol (Calc) 131 (H) mg/dL (calc)    Comment: Reference range: <100 . Desirable range <100 mg/dL for primary prevention;   <70 mg/dL for patients with CHD or diabetic patients  with > or = 2 CHD risk factors. Marland Kitchen LDL-C is now  calculated using the Martin-Hopkins  calculation, which is a validated novel method providing  better accuracy than the Friedewald equation in the  estimation of LDL-C.  Cresenciano Genre et al. Annamaria Helling. [id number]): 2061-2068  (http://education.[employer].com/faq/FAQ164)    Total CHOL/HDL Ratio 4.1 <5.0 (calc)   Non-HDL Cholesterol (Calc) 154 (H) <130 mg/dL (calc)    Comment: For patients with diabetes plus 1 major ASCVD risk  factor, treating to a non-HDL-C goal of <100 mg/dL  (LDL-C of <70 mg/dL) is considered a therapeutic  option.   COMPLETE METABOLIC PANEL WITH GFR     Status: Abnormal   Collection Time: 10/19/22 10:30 AM  Result Value Ref Range   Glucose, Bld 93 65 - 99 mg/dL    Comment: .            Fasting reference interval .    BUN 23 7 - 25 mg/dL   Creat 1.41 (H) 0.60 - 1.00 mg/dL   eGFR 39 (L) > OR = 60 mL/min/1.4m   BUN/Creatinine Ratio 16 6 - 22 (calc)   Sodium 140 135 - 146 mmol/L   Potassium 4.3 3.5 - 5.3 mmol/L   Chloride 103 98 - 110 mmol/L   CO2 30 20 - 32 mmol/L   Calcium 10.0 8.6 - 10.4 mg/dL   Total Protein 7.1 6.1 - 8.1 g/dL   Albumin 4.0 3.6 - 5.1 g/dL    Globulin 3.1 1.9 - 3.7 g/dL (calc)   AG Ratio 1.3 1.0 - 2.5 (calc)   Total Bilirubin 0.5 0.2 - 1.2 mg/dL   Alkaline phosphatase (APISO) 49 37 - 153 U/L   AST 21 10 - 35 U/L   ALT 13 6 - 29 U/L    PHQ2/9:    01/10/2023   10:36 AM 01/03/2023    9:12 AM 11/28/2022    2:29 PM 10/25/2022    2:55 PM 10/19/2022    9:25 AM  Depression screen PHQ 2/9  Decreased Interest 0 0 0 0 0  Down, Depressed, Hopeless 0 0 0 0 0  PHQ - 2 Score 0 0 0 0 0  Altered sleeping 0 0 0 0 0  Tired, decreased energy 0 0 0 0 0  Change in appetite 0 0 0 0 0  Feeling bad or failure about yourself  0 0 0 0 0  Trouble concentrating 0 0 0 0 0  Moving slowly or fidgety/restless 0 0 0 0 0  Suicidal thoughts 0 0 0 0 0  PHQ-9 Score 0 0 0 0 0  Difficult doing work/chores    Not difficult at all     phq 9 is negative   Fall Risk:    01/10/2023   10:36 AM 01/03/2023    9:09 AM 11/28/2022    2:29 PM 10/25/2022    2:50 PM 10/19/2022    9:25 AM  Fall Risk   Falls in the past year? 0 0 0 0 0  Number falls in past yr: 0 0  0   Injury with Fall? 0 0  0   Risk for fall due to : No Fall Risks No Fall Risks No Fall Risks  No Fall Risks  Follow up Falls prevention discussed Education provided;Falls prevention discussed Falls prevention discussed  Falls prevention discussed;Falls evaluation completed;Education provided      Functional Status Survey: Is the patient deaf or have difficulty hearing?: No Does the patient have difficulty seeing, even when wearing glasses/contacts?: No Does the patient have difficulty concentrating, remembering, or making decisions?:  No Does the patient have difficulty walking or climbing stairs?: No Does the patient have difficulty dressing or bathing?: No Does the patient have difficulty doing errands alone such as visiting a doctor's office or shopping?: No    Assessment & Plan  1. Cystocele with rectocele  - Ambulatory referral to Urology   I also discussed PT

## 2023-01-10 ENCOUNTER — Ambulatory Visit: Payer: Medicare PPO | Admitting: Family Medicine

## 2023-01-10 ENCOUNTER — Encounter: Payer: Self-pay | Admitting: Family Medicine

## 2023-01-10 VITALS — BP 118/68 | HR 83 | Resp 16 | Ht 65.0 in | Wt 165.0 lb

## 2023-01-10 DIAGNOSIS — N8110 Cystocele, unspecified: Secondary | ICD-10-CM

## 2023-01-10 DIAGNOSIS — N816 Rectocele: Secondary | ICD-10-CM

## 2023-01-21 ENCOUNTER — Other Ambulatory Visit: Payer: Self-pay

## 2023-01-21 ENCOUNTER — Ambulatory Visit: Payer: Medicare PPO | Admitting: Urology

## 2023-01-21 ENCOUNTER — Telehealth: Payer: Self-pay

## 2023-01-21 DIAGNOSIS — N816 Rectocele: Secondary | ICD-10-CM

## 2023-01-21 NOTE — Telephone Encounter (Signed)
Copied from [place] [phone]. Topic: General - Inquiry >> Jan 21, 2023  9:24 AM Marcellus Scott wrote: Reason for CRM: Pt stated Dr.Sowles referred her to a urologist, and she had an appointment this afternoon. Stated they reached out to her and advised her she needed to see a gynecologist instead.   Please advise. >> Jan 21, 2023  9:29 AM Suanne Marker B wrote: Please advise    Changed referral to GYN

## 2023-01-30 ENCOUNTER — Inpatient Hospital Stay: Payer: Medicare PPO | Attending: Oncology

## 2023-01-30 DIAGNOSIS — E538 Deficiency of other specified B group vitamins: Secondary | ICD-10-CM | POA: Insufficient documentation

## 2023-01-30 MED ORDER — CYANOCOBALAMIN 1000 MCG/ML IJ SOLN
1000.0000 ug | Freq: Once | INTRAMUSCULAR | Status: AC
  Administered 2023-01-30: 1000 ug via INTRAMUSCULAR
  Filled 2023-01-30: qty 1

## 2023-01-31 ENCOUNTER — Inpatient Hospital Stay: Payer: Medicare PPO

## 2023-01-31 DIAGNOSIS — D696 Thrombocytopenia, unspecified: Secondary | ICD-10-CM

## 2023-01-31 DIAGNOSIS — E538 Deficiency of other specified B group vitamins: Secondary | ICD-10-CM | POA: Diagnosis not present

## 2023-01-31 LAB — VITAMIN B12: Vitamin B-12: 7131 pg/mL — ABNORMAL HIGH (ref 180–914)

## 2023-02-11 DIAGNOSIS — H40003 Preglaucoma, unspecified, bilateral: Secondary | ICD-10-CM | POA: Diagnosis not present

## 2023-02-12 DIAGNOSIS — N1832 Chronic kidney disease, stage 3b: Secondary | ICD-10-CM | POA: Diagnosis not present

## 2023-02-14 DIAGNOSIS — I10 Essential (primary) hypertension: Secondary | ICD-10-CM | POA: Diagnosis not present

## 2023-02-14 DIAGNOSIS — N2581 Secondary hyperparathyroidism of renal origin: Secondary | ICD-10-CM | POA: Diagnosis not present

## 2023-02-14 DIAGNOSIS — N1831 Chronic kidney disease, stage 3a: Secondary | ICD-10-CM | POA: Diagnosis not present

## 2023-02-15 DIAGNOSIS — H43813 Vitreous degeneration, bilateral: Secondary | ICD-10-CM | POA: Diagnosis not present

## 2023-02-15 DIAGNOSIS — Z0100 Encounter for examination of eyes and vision without abnormal findings: Secondary | ICD-10-CM | POA: Diagnosis not present

## 2023-02-15 DIAGNOSIS — H40003 Preglaucoma, unspecified, bilateral: Secondary | ICD-10-CM | POA: Diagnosis not present

## 2023-02-15 DIAGNOSIS — H2513 Age-related nuclear cataract, bilateral: Secondary | ICD-10-CM | POA: Diagnosis not present

## 2023-02-15 NOTE — Progress Notes (Unsigned)
Name: Morgan Livingston   MRN: [id number]    DOB: 12/05/1945   Date:02/18/2023       Progress Note  Subjective  Chief Complaint  Follow Up  HPI  HTN: she is on Losartan hctz  100 /12.5  She denies  chest pain , palpitation, SOB or orthostatic changes .   B12 deficiency /Thrombocytopenia/MUGS  : under the care of Dr. Cathie Hoops, Under the care of hematologist,  stable , she gets monthly B12 injections    Atherosclerosis aorta/dyslipidemia: found on X-ray of lumbar spine, she does not have claudication. She will continue statin and aspirin daily , last LDL was above goal, she is now on zetia and rosuvastatin    Proteinuria and CKI stage III a : seeing Dr. Cherylann Ratel,  She denies cramping, pruritis or decrease in urine output  .Reviewed labs done this month and GFR has improved, up to 45, she still has secondary hyperparathyroidism he is on ARB and bp is at goal   Positive for Sjogren's Anti SS-A and also ANA: on labs done 03/2018 by Dr. Cherylann Ratel , 03/2018 showed negative for M Spike UPEP spike and monitored by  Dr. Cathie Hoops. She denies dry mouth , but states she has a long history of dry eyes and not changed - uses eye drops. She states only mild vaginal dryness  Cystocele and rectocele: she has an appointment scheduled with gyn, still has a bulging sensation on vulva after standing most of the day , sometimes also present when she stands up in am's but resolves when goes to bed    Hyperglycemia: no family history of diabetes, A1C was as high at 6.4 % but last visit it was 6 %  she is eating healthy,   Patient Active Problem List   Diagnosis Date Noted   Anemia in stage 3b chronic kidney disease 08/02/2022   Thrombocytopenia 08/02/2022   Thrombocytopathia 04/20/2022   History of IBS 06/03/2020   Mass of arm, left    MGUS (monoclonal gammopathy of unknown significance) 09/01/2018   Secondary hyperparathyroidism 09/01/2018   B12 deficiency 08/19/2018   Atherosclerosis of aorta 07/25/2018   Proteinuria  05/09/2018   Positive ANA (antinuclear antibody) 04/02/2018   Bilateral dry eyes 04/02/2018   History of hysterectomy 05/23/2015   Stage 3b chronic kidney disease ([facility]) 05/23/2015   Essential hypertension 05/22/2015   Chronic constipation 05/22/2015   DDD (degenerative disc disease), lumbar 05/22/2015   DD (diverticular disease) 05/22/2015   Dyslipidemia 05/22/2015   History of cervical cancer 05/22/2015   Helicobacter pylori gastrointestinal tract infection 05/22/2015   Blood glucose elevated 05/22/2015   Overweight (BMI 25.0-29.9) 05/22/2015   Perennial allergic rhinitis with seasonal variation 05/22/2015   Vitamin D deficiency 05/22/2015   Impingement syndrome of shoulder 01/14/2015    Past Surgical History:  Procedure Laterality Date   ABDOMINAL HYSTERECTOMY     BREAST BIOPSY Right 04/09/2006   negative   BREAST BIOPSY Left 12/04/2017   fibroadenmatoid change with coarse calcs   COLONOSCOPY WITH PROPOFOL N/A 10/07/2018   Procedure: COLONOSCOPY WITH PROPOFOL;  Surgeon: Wyline Mood, MD;  Location: [facility] ENDOSCOPY;  Service: Gastroenterology;  Laterality: N/A;   COLONOSCOPY WITH PROPOFOL N/A 11/21/2021   Procedure: COLONOSCOPY WITH PROPOFOL;  Surgeon: Wyline Mood, MD;  Location[facility] ENDOSCOPY;  Service: Gastroenterology;  Laterality: N/A;   LIPOMA EXCISION Left 04/15/2020   Procedure: EXCISION LIPOMA, left arm;  Surgeon: Duanne Guess, MD;  Location: [facility] ORS;  Service: General;  Laterality: Left;   XI ROBOTIC  LAPAROSCOPIC ASSISTED APPENDECTOMY N/A 11/28/2021   Procedure: XI ROBOTIC LAPAROSCOPIC ASSISTED APPENDECTOMY;  Surgeon: Carolan Shiver, MD;  Location: [facility] ORS;  Service: General;  Laterality: N/A;    Family History  Problem Relation Age of Onset   Congestive Heart Failure Mother 25   Heart attack Father 65   Sinusitis Sister    Stomach cancer Sister    Uterine cancer Sister 58   Liver cancer Sister    Pancreatic cancer Sister    Dementia Sister        14    Heart attack Son    Breast cancer Neg Hx     Social History   Tobacco Use   Smoking status: Former    Packs/day: 1.00    Years: 2.00    Additional pack years: 0.00    Total pack years: 2.00    Types: Cigarettes    Quit date: 1963    Years since quitting: 61.3   Smokeless tobacco: Never   Tobacco comments:    smoking cessation materials not required  Substance Use Topics   Alcohol use: No    Alcohol/week: 0.0 standard drinks of alcohol     Current Outpatient Medications:    acetaminophen (TYLENOL) 500 MG tablet, Take 2 tablets (1,000 mg total) by mouth every 6 (six) hours as needed for mild pain or headache., Disp: 30 tablet, Rfl: 0   aspirin 81 MG tablet, Take 81 mg by mouth daily., Disp: , Rfl:    Biotin 5 MG CAPS, Take by mouth daily., Disp: , Rfl:    carboxymethylcellulose (REFRESH PLUS) 0.5 % SOLN, Place 1 drop into both eyes daily., Disp: , Rfl:    cholecalciferol (VITAMIN D) 1000 UNITS tablet, Take 1,000 Units by mouth daily. , Disp: , Rfl:    ezetimibe (ZETIA) 10 MG tablet, Take 1 tablet (10 mg total) by mouth daily., Disp: 90 tablet, Rfl: 3   ferrous sulfate 324 MG TBEC, Take 65 mg by mouth daily with breakfast., Disp: , Rfl:    fexofenadine (ALLEGRA) 180 MG tablet, Take 180 mg by mouth daily., Disp: , Rfl:    losartan-hydrochlorothiazide (HYZAAR) 100-12.5 MG tablet, Take 1 tablet by mouth daily., Disp: 90 tablet, Rfl: 1   rosuvastatin (CRESTOR) 20 MG tablet, TAKE 1 TABLET BY MOUTH EVERYDAY AT BEDTIME, Disp: 90 tablet, Rfl: 3   vitamin B-12 (CYANOCOBALAMIN) 1000 MCG tablet, Take 1 tablet (1,000 mcg total) by mouth daily. (Patient taking differently: Take 1,000 mcg by mouth once a week. Wednesday), Disp: 90 tablet, Rfl: 1   vitamin E 1000 UNIT capsule, Take 1,000 Units by mouth daily., Disp: , Rfl:   No Known Allergies  I personally reviewed active problem list, medication list, allergies, family history, social history, health maintenance with the patient/caregiver  today.   ROS  Ten systems reviewed and is negative except as mentioned in HPI   Objective  Vitals:   02/18/23 0939  BP: 126/70  Pulse: 63  Resp: 16  SpO2: 98%  Weight: 166 lb (75.3 kg)  Height:  (1.651 m)    Body mass index is 27.62 kg/m.  Physical Exam  Constitutional: Patient appears well-developed and well-nourished.  No distress.  HEENT: head atraumatic, normocephalic, pupils equal and reactive to light, neck supple Cardiovascular: Normal rate, regular rhythm and normal heart sounds.  No murmur heard. No BLE edema. Pulmonary/Chest: Effort normal and breath sounds normal. No respiratory distress. Abdominal: Soft.  There is no tenderness. Psychiatric: Patient has a normal mood and affect.  behavior is normal. Judgment and thought content normal.   Recent Results (from the past 2160 hour(s))  Vitamin B12     Status: Abnormal   Collection Time: 01/31/23 10:18 AM  Result Value Ref Range   Vitamin B-12 7,131 (H) 180 - 914 pg/mL    Comment: (NOTE) This assay is not validated for testing neonatal or myeloproliferative syndrome specimens for Vitamin B12 levels. Performed at [facility], [address], [place], [place] [address]     PHQ2/9:    02/18/2023    9:32 AM 01/10/2023   10:36 AM 01/03/2023    9:12 AM 11/28/2022    2:29 PM 10/25/2022    2:55 PM  Depression screen PHQ 2/9  Decreased Interest 0 0 0 0 0  Down, Depressed, Hopeless 0 0 0 0 0  PHQ - 2 Score 0 0 0 0 0  Altered sleeping 0 0 0 0 0  Tired, decreased energy 0 0 0 0 0  Change in appetite 0 0 0 0 0  Feeling bad or failure about yourself  0 0 0 0 0  Trouble concentrating 0 0 0 0 0  Moving slowly or fidgety/restless 0 0 0 0 0  Suicidal thoughts 0 0 0 0 0  PHQ-9 Score 0 0 0 0 0  Difficult doing work/chores     Not difficult at all    phq 9 is negative   Fall Risk:    02/18/2023    9:39 AM 01/10/2023   10:36 AM 01/03/2023    9:09 AM 11/28/2022    2:29 PM 10/25/2022    2:50 PM  Fall Risk    Falls in the past year? 0 0 0 0 0  Number falls in past yr: 0 0 0  0  Injury with Fall? 0 0 0  0  Risk for fall due to : No Fall Risks No Fall Risks No Fall Risks No Fall Risks   Follow up Falls prevention discussed Falls prevention discussed Education provided;Falls prevention discussed Falls prevention discussed       Functional Status Survey: Is the patient deaf or have difficulty hearing?: No Does the patient have difficulty seeing, even when wearing glasses/contacts?: No Does the patient have difficulty concentrating, remembering, or making decisions?: No Does the patient have difficulty walking or climbing stairs?: No Does the patient have difficulty dressing or bathing?: No Does the patient have difficulty doing errands alone such as visiting a doctor's office or shopping?: No    Assessment & Plan  1. Atherosclerosis of aorta  On statin therapy and zetia   2. Secondary hyperparathyroidism  Stable and monitored by nephrologist   3. Thrombocytopathia  Under the care of Dr. Cathie Hoops  4. Chronic kidney disease, stage 3a  improved  5. Essential hypertension  At goal   6. Vitamin D deficiency   7. Dyslipidemia  - Lipid Profile - Hepatic function panel  8. Cystocele with rectocele  She has a visit with gyn coming up   9. B12 deficiency  She gest at hematologist   10. Hyperglycemia  - Hemoglobin A1c

## 2023-02-18 ENCOUNTER — Encounter: Payer: Self-pay | Admitting: Family Medicine

## 2023-02-18 ENCOUNTER — Ambulatory Visit: Payer: Medicare PPO | Admitting: Family Medicine

## 2023-02-18 VITALS — BP 126/70 | HR 63 | Resp 16 | Ht 65.0 in | Wt 166.0 lb

## 2023-02-18 DIAGNOSIS — N1831 Chronic kidney disease, stage 3a: Secondary | ICD-10-CM

## 2023-02-18 DIAGNOSIS — N2581 Secondary hyperparathyroidism of renal origin: Secondary | ICD-10-CM

## 2023-02-18 DIAGNOSIS — E538 Deficiency of other specified B group vitamins: Secondary | ICD-10-CM

## 2023-02-18 DIAGNOSIS — E785 Hyperlipidemia, unspecified: Secondary | ICD-10-CM | POA: Diagnosis not present

## 2023-02-18 DIAGNOSIS — N8110 Cystocele, unspecified: Secondary | ICD-10-CM

## 2023-02-18 DIAGNOSIS — I700 Atherosclerosis of aorta: Secondary | ICD-10-CM | POA: Diagnosis not present

## 2023-02-18 DIAGNOSIS — I10 Essential (primary) hypertension: Secondary | ICD-10-CM | POA: Diagnosis not present

## 2023-02-18 DIAGNOSIS — R739 Hyperglycemia, unspecified: Secondary | ICD-10-CM | POA: Diagnosis not present

## 2023-02-18 DIAGNOSIS — D691 Qualitative platelet defects: Secondary | ICD-10-CM | POA: Diagnosis not present

## 2023-02-18 DIAGNOSIS — E559 Vitamin D deficiency, unspecified: Secondary | ICD-10-CM

## 2023-02-18 DIAGNOSIS — N816 Rectocele: Secondary | ICD-10-CM

## 2023-02-18 NOTE — Addendum Note (Signed)
Addended by: Alba Cory F on: 02/18/2023 10:21 AM   Modules accepted: Orders

## 2023-02-19 ENCOUNTER — Other Ambulatory Visit: Payer: Self-pay | Admitting: Family Medicine

## 2023-02-19 DIAGNOSIS — E785 Hyperlipidemia, unspecified: Secondary | ICD-10-CM

## 2023-02-19 DIAGNOSIS — I700 Atherosclerosis of aorta: Secondary | ICD-10-CM

## 2023-02-19 LAB — HEPATIC FUNCTION PANEL
AG Ratio: 1.3 (calc) (ref 1.0–2.5)
ALT: 15 U/L (ref 6–29)
AST: 22 U/L (ref 10–35)
Albumin: 4 g/dL (ref 3.6–5.1)
Alkaline phosphatase (APISO): 46 U/L (ref 37–153)
Bilirubin, Direct: 0 mg/dL (ref 0.0–0.2)
Globulin: 3.1 g/dL (calc) (ref 1.9–3.7)
Indirect Bilirubin: 0.3 mg/dL (calc) (ref 0.2–1.2)
Total Bilirubin: 0.3 mg/dL (ref 0.2–1.2)
Total Protein: 7.1 g/dL (ref 6.1–8.1)

## 2023-02-19 LAB — LIPID PANEL
Cholesterol: 191 mg/dL (ref <200)
HDL: 48 mg/dL — ABNORMAL LOW (ref >=50)
LDL Cholesterol (Calc): 121 mg/dL (calc) — ABNORMAL HIGH
Non-HDL Cholesterol (Calc): 143 mg/dL (calc) — ABNORMAL HIGH (ref <130)
Total CHOL/HDL Ratio: 4 (calc) (ref <5.0)
Triglycerides: 112 mg/dL (ref <150)

## 2023-02-19 LAB — HEMOGLOBIN A1C
Hgb A1c MFr Bld: 6.3 % of total Hgb — ABNORMAL HIGH (ref <5.7)
Mean Plasma Glucose: 134 mg/dL
eAG (mmol/L): 7.4 mmol/L

## 2023-02-19 MED ORDER — ROSUVASTATIN CALCIUM 40 MG PO TABS
40.0000 mg | ORAL_TABLET | Freq: Every day | ORAL | 1 refills | Status: DC
Start: 2023-02-19 — End: 2023-06-21

## 2023-02-20 ENCOUNTER — Ambulatory Visit: Payer: Medicare PPO | Admitting: Obstetrics and Gynecology

## 2023-02-20 ENCOUNTER — Encounter: Payer: Self-pay | Admitting: Obstetrics and Gynecology

## 2023-02-20 VITALS — BP 145/83 | HR 58 | Ht 65.0 in | Wt 168.4 lb

## 2023-02-20 DIAGNOSIS — Z7689 Persons encountering health services in other specified circumstances: Secondary | ICD-10-CM

## 2023-02-20 DIAGNOSIS — N8189 Other female genital prolapse: Secondary | ICD-10-CM

## 2023-02-20 NOTE — Progress Notes (Signed)
HPI:      [name] is a 77 y.o. G3P2 who LMP was No LMP recorded. Patient has had a hysterectomy.  Subjective:   She presents today stating that over the last several months she has noticed something bulging from the vagina.  It makes her uncomfortable.  Prior to this bulging she was noticing some urine loss but since this has happened her urine loss has slightly improved. She is uncomfortable enough that she desires surgery. She has not yet sure if she would like to continue to have intercourse or not and will inform us at a later time.  She is not currently having intercourse.  She does report that she does a lot of fairly heavy lifting with her cooking and working at [employer]. Of significant note patient has had a previous hysterectomy.    Hx: The following portions of the patient's history were reviewed and updated as appropriate:             She  has a past medical history of Allergy, Cancer, Hyperlipidemia, Hypertension, and Kidney stones (06/21/2018). She does not have any pertinent problems on file. She  has a past surgical history that includes Abdominal hysterectomy; Colonoscopy with propofol (N/A, 10/07/2018); Lipoma excision (Left, 04/15/2020); Breast biopsy (Right, 04/09/2006); Breast biopsy (Left, 12/04/2017); Colonoscopy with propofol (N/A, 11/21/2021); Xi robotic laparoscopic assisted appendectomy (N/A, 11/28/2021); and Appendectomy. Her family history includes Congestive Heart Failure (age of onset: 72) in her mother; Dementia in her sister; Heart attack in her son; Heart attack (age of onset: 91) in her father; Liver cancer in her sister; Pancreatic cancer in her sister; Sinusitis in her sister; Stomach cancer in her sister; Uterine cancer (age of onset: 28) in her sister. She  reports that she quit smoking about 61 years ago. Her smoking use included cigarettes. She has a 2.00 pack-year smoking history. She has never used smokeless tobacco. She reports that she does not  drink alcohol and does not use drugs. She has a current medication list which includes the following prescription(s): acetaminophen, aspirin, biotin, carboxymethylcellulose, cholecalciferol, ezetimibe, ferrous sulfate, fexofenadine, losartan-hydrochlorothiazide, rosuvastatin, cyanocobalamin, and vitamin e. She has No Known Allergies.       Review of Systems:  Review of Systems  Constitutional: Denied constitutional symptoms, night sweats, recent illness, fatigue, fever, insomnia and weight loss.  Eyes: Denied eye symptoms, eye pain, photophobia, vision change and visual disturbance.  Ears/Nose/Throat/Neck: Denied ear, nose, throat or neck symptoms, hearing loss, nasal discharge, sinus congestion and sore throat.  Cardiovascular: Denied cardiovascular symptoms, arrhythmia, chest pain/pressure, edema, exercise intolerance, orthopnea and palpitations.  Respiratory: Denied pulmonary symptoms, asthma, pleuritic pain, productive sputum, cough, dyspnea and wheezing.  Gastrointestinal: Denied, gastro-esophageal reflux, melena, nausea and vomiting.  Genitourinary: See HPI for additional information.  Musculoskeletal: Denied musculoskeletal symptoms, stiffness, swelling, muscle weakness and myalgia.  Dermatologic: Denied dermatology symptoms, rash and scar.  Neurologic: Denied neurology symptoms, dizziness, headache, neck pain and syncope.  Psychiatric: Denied psychiatric symptoms, anxiety and depression.  Endocrine: Denied endocrine symptoms including hot flashes and night sweats.   Meds:   Current Outpatient Medications on File Prior to Visit  Medication Sig Dispense Refill   acetaminophen (TYLENOL) 500 MG tablet Take 2 tablets (1,000 mg total) by mouth every 6 (six) hours as needed for mild pain or headache. 30 tablet 0   aspirin 81 MG tablet Take 81 mg by mouth daily.     Biotin 5 MG CAPS Take by mouth daily.  carboxymethylcellulose (REFRESH PLUS) 0.5 % SOLN Place 1 drop into both eyes daily.      cholecalciferol (VITAMIN D) 1000 UNITS tablet Take 1,000 Units by mouth daily.      ezetimibe (ZETIA) 10 MG tablet Take 1 tablet (10 mg total) by mouth daily. 90 tablet 3   ferrous sulfate 324 MG TBEC Take 65 mg by mouth daily with breakfast.     fexofenadine (ALLEGRA) 180 MG tablet Take 180 mg by mouth daily.     losartan-hydrochlorothiazide (HYZAAR) 100-12.5 MG tablet Take 1 tablet by mouth daily. 90 tablet 1   rosuvastatin (CRESTOR) 40 MG tablet Take 1 tablet (40 mg total) by mouth daily. 90 tablet 1   vitamin B-12 (CYANOCOBALAMIN) 1000 MCG tablet Take 1 tablet (1,000 mcg total) by mouth daily. (Patient taking differently: Take 1,000 mcg by mouth once a week. Wednesday) 90 tablet 1   vitamin E 1000 UNIT capsule Take 1,000 Units by mouth daily.     No current facility-administered medications on file prior to visit.      Objective:     Vitals:   02/20/23 0331 02/20/23 1526  BP: 128/83 (!) 145/83  Pulse:  (!) 58   Filed Weights   02/20/23 1526  Weight: 168 lb 6.4 oz (76.4 kg)              Physical examination   Pelvic:   Vulva: Normal appearance.  No lesions.  Vagina: Third-degree cystocele second-degree apical prolapse second-degree rectocele  Support: Normal pelvic support.  Urethra No masses tenderness or scarring.  Meatus Normal size without lesions or prolapse.  Cervix: Surgically absent  Anus: Normal exam.  No lesions.  Perineum: Normal exam.  No lesions.        Bimanual   Uterus: Surgically absent  Adnexae: No masses.  Non-tender to palpation.  Cul-de-sac: Negative for abnormality.             Assessment:    G3P2 Patient Active Problem List   Diagnosis Date Noted   Anemia in stage 3b chronic kidney disease 08/02/2022   Thrombocytopenia 08/02/2022   Thrombocytopathia 04/20/2022   History of IBS 06/03/2020   Mass of arm, left    MGUS (monoclonal gammopathy of unknown significance) 09/01/2018   Secondary hyperparathyroidism 09/01/2018   B12  deficiency 08/19/2018   Atherosclerosis of aorta 07/25/2018   Proteinuria 05/09/2018   Positive ANA (antinuclear antibody) 04/02/2018   Bilateral dry eyes 04/02/2018   History of hysterectomy 05/23/2015   Stage 3b chronic kidney disease (HCC) 05/23/2015   Essential hypertension 05/22/2015   Chronic constipation 05/22/2015   DDD (degenerative disc disease), lumbar 05/22/2015   DD (diverticular disease) 05/22/2015   Dyslipidemia 05/22/2015   History of cervical cancer 05/22/2015   Helicobacter pylori gastrointestinal tract infection 05/22/2015   Blood glucose elevated 05/22/2015   Overweight (BMI 25.0-29.9) 05/22/2015   Perennial allergic rhinitis with seasonal variation 05/22/2015   Vitamin D deficiency 05/22/2015   Impingement syndrome of shoulder 01/14/2015     1. Establishing care with new doctor, encounter for   2. Pelvic relaxation disorder        Plan:            1.  Discussed pelvic relaxation in detail.  Specific to her condition.  She has expressed her interest in surgery.  Would recommend A&P repair with TOT.  She plans to return in the near future for preop appointment.   Orders No orders of the defined types were placed in this  encounter.   No orders of the defined types were placed in this encounter.     F/U  Return in about 2 weeks (around 03/06/2023). I spent 34 minutes involved in the care of this patient preparing to see the patient by obtaining and reviewing her medical history (including labs, imaging tests and prior procedures), documenting clinical information in the electronic health record (EHR), counseling and coordinating care plans, writing and sending prescriptions, ordering tests or procedures and in direct communicating with the patient and medical staff discussing pertinent items from her history and physical exam.  Elonda Husky, M.D. 02/20/2023 4:03 PM

## 2023-02-20 NOTE — Progress Notes (Signed)
Patient presents today due to possible cystocele or rectocele. She states constantly feeling a bulge but does not experience any pain, trouble with incontinence or BM's.History of hysterectomy in the 1960's.

## 2023-03-01 ENCOUNTER — Inpatient Hospital Stay: Payer: Medicare PPO | Attending: Oncology

## 2023-03-07 ENCOUNTER — Encounter: Payer: Self-pay | Admitting: Obstetrics and Gynecology

## 2023-03-07 ENCOUNTER — Ambulatory Visit: Payer: Medicare PPO | Admitting: Obstetrics and Gynecology

## 2023-03-07 VITALS — BP 149/86 | HR 59 | Ht 65.0 in | Wt 167.7 lb

## 2023-03-07 DIAGNOSIS — N8189 Other female genital prolapse: Secondary | ICD-10-CM | POA: Diagnosis not present

## 2023-03-07 DIAGNOSIS — Z01818 Encounter for other preprocedural examination: Secondary | ICD-10-CM

## 2023-03-07 MED ORDER — METRONIDAZOLE 500 MG PO TABS
500.0000 mg | ORAL_TABLET | Freq: Two times a day (BID) | ORAL | 0 refills | Status: DC
Start: 2023-03-07 — End: 2023-04-08

## 2023-03-07 MED ORDER — ESTRADIOL 0.1 MG/GM VA CREA
0.2500 | TOPICAL_CREAM | Freq: Every day | VAGINAL | 3 refills | Status: AC
Start: 2023-03-07 — End: 2024-03-06

## 2023-03-07 NOTE — Progress Notes (Signed)
PRE-OPERATIVE HISTORY AND PHYSICAL EXAM  PCP:  Alba Cory, MD Subjective:   HPI:  Morgan Livingston is a 77 y.o. G3P2.  No LMP recorded. Patient has had a hysterectomy.  She presents today for a pre-op discussion and PE.  She has the following symptoms: Pelvic pressure-vaginal prolapse  Review of Systems:   Constitutional: Denied constitutional symptoms, night sweats, recent illness, fatigue, fever, insomnia and weight loss.  Eyes: Denied eye symptoms, eye pain, photophobia, vision change and visual disturbance.  Ears/Nose/Throat/Neck: Denied ear, nose, throat or neck symptoms, hearing loss, nasal discharge, sinus congestion and sore throat.  Cardiovascular: Denied cardiovascular symptoms, arrhythmia, chest pain/pressure, edema, exercise intolerance, orthopnea and palpitations.  Respiratory: Denied pulmonary symptoms, asthma, pleuritic pain, productive sputum, cough, dyspnea and wheezing.  Gastrointestinal: Denied, gastro-esophageal reflux, melena, nausea and vomiting.  Genitourinary: Denied genitourinary symptoms including symptomatic vaginal discharge, pelvic relaxation issues, and urinary complaints.  Musculoskeletal: Denied musculoskeletal symptoms, stiffness, swelling, muscle weakness and myalgia.  Dermatologic: Denied dermatology symptoms, rash and scar.  Neurologic: Denied neurology symptoms, dizziness, headache, neck pain and syncope.  Psychiatric: Denied psychiatric symptoms, anxiety and depression.  Endocrine: Denied endocrine symptoms including hot flashes and night sweats.   OB History  Gravida Para Term Preterm AB Living  3 2          SAB IAB Ectopic Multiple Live Births               # Outcome Date GA Lbr Len/2nd Weight Sex Delivery Anes PTL Lv  3 Gravida           2 Para           1 Para             Past Medical History:  Diagnosis Date   Allergy    Cancer (HCC)    Uterine Cancer   Hyperlipidemia    Hypertension    Kidney stones 06/21/2018     Past Surgical History:  Procedure Laterality Date   ABDOMINAL HYSTERECTOMY     APPENDECTOMY     BREAST BIOPSY Right 04/09/2006   negative   BREAST BIOPSY Left 12/04/2017   fibroadenmatoid change with coarse calcs   COLONOSCOPY WITH PROPOFOL N/A 10/07/2018   Procedure: COLONOSCOPY WITH PROPOFOL;  Surgeon: Wyline Mood, MD;  Location: [facility] ENDOSCOPY;  Service: Gastroenterology;  Laterality: N/A;   COLONOSCOPY WITH PROPOFOL N/A 11/21/2021   Procedure: COLONOSCOPY WITH PROPOFOL;  Surgeon: Wyline Mood, MD;  Location: [facility] ENDOSCOPY;  Service: Gastroenterology;  Laterality: N/A;   LIPOMA EXCISION Left 04/15/2020   Procedure: EXCISION LIPOMA, left arm;  Surgeon: Duanne Guess, MD;  Location: [facility] ORS;  Service: General;  Laterality: Left;   XI ROBOTIC LAPAROSCOPIC ASSISTED APPENDECTOMY N/A 11/28/2021   Procedure: XI ROBOTIC LAPAROSCOPIC ASSISTED APPENDECTOMY;  Surgeon: Carolan Shiver, MD;  Location: [facility] ORS;  Service: General;  Laterality: N/A;      SOCIAL HISTORY:  Social History   Tobacco Use  Smoking Status Former   Packs/day: 1.00   Years: 2.00   Additional pack years: 0.00   Total pack years: 2.00   Types: Cigarettes   Quit date: 1963   Years since quitting: 61.3  Smokeless Tobacco Never  Tobacco Comments   smoking cessation materials not required   Social History   Substance and Sexual Activity  Alcohol Use No   Alcohol/week: 0.0 standard drinks of alcohol    Social History   Substance and Sexual Activity  Drug Use No  Family History  Problem Relation Age of Onset   Congestive Heart Failure Mother 27   Heart attack Father 44   Sinusitis Sister    Stomach cancer Sister    Uterine cancer Sister 3   Liver cancer Sister    Pancreatic cancer Sister    Dementia Sister        41   Heart attack Son    Breast cancer Neg Hx     ALLERGIES:  Patient has no known allergies.  MEDS:   Current Outpatient Medications on File Prior to Visit   Medication Sig Dispense Refill   acetaminophen (TYLENOL) 500 MG tablet Take 2 tablets (1,000 mg total) by mouth every 6 (six) hours as needed for mild pain or headache. 30 tablet 0   aspirin 81 MG tablet Take 81 mg by mouth daily.     Biotin 5 MG CAPS Take by mouth daily.     carboxymethylcellulose (REFRESH PLUS) 0.5 % SOLN Place 1 drop into both eyes daily.     cholecalciferol (VITAMIN D) 1000 UNITS tablet Take 1,000 Units by mouth daily.      ezetimibe (ZETIA) 10 MG tablet Take 1 tablet (10 mg total) by mouth daily. 90 tablet 3   ferrous sulfate 324 MG TBEC Take 65 mg by mouth daily with breakfast.     fexofenadine (ALLEGRA) 180 MG tablet Take 180 mg by mouth daily.     losartan-hydrochlorothiazide (HYZAAR) 100-12.5 MG tablet Take 1 tablet by mouth daily. 90 tablet 1   rosuvastatin (CRESTOR) 40 MG tablet Take 1 tablet (40 mg total) by mouth daily. 90 tablet 1   vitamin B-12 (CYANOCOBALAMIN) 1000 MCG tablet Take 1 tablet (1,000 mcg total) by mouth daily. (Patient taking differently: Take 1,000 mcg by mouth once a week. Wednesday) 90 tablet 1   vitamin E 1000 UNIT capsule Take 1,000 Units by mouth daily.     No current facility-administered medications on file prior to visit.    Meds ordered this encounter  Medications   metroNIDAZOLE (FLAGYL) 500 MG tablet    Sig: Take 1 tablet (500 mg total) by mouth 2 (two) times daily. Begin 5 days prior to scheduled surgery as directed.    Dispense:  10 tablet    Refill:  0   estradiol (ESTRACE) 0.1 MG/GM vaginal cream    Sig: Place 0.25 Applicatorfuls vaginally at bedtime.    Dispense:  90 g    Refill:  3     Physical examination BP (!) 149/86   Pulse (!) 59   Ht 5\' 5"  (1.651 m)   Wt 167 lb 11.2 oz (76.1 kg)   BMI 27.91 kg/m   General NAD, Conversant  HEENT Atraumatic; Op clear with mmm.  Normo-cephalic. Pupils reactive. Anicteric sclerae  Thyroid/Neck Smooth without nodularity or enlargement. Normal ROM.  Neck Supple.  Skin No rashes,  lesions or ulceration. Normal palpated skin turgor. No nodularity.  Breasts: No masses or discharge.  Symmetric.  No axillary adenopathy.  Lungs: Clear to auscultation.No rales or wheezes. Normal Respiratory effort, no retractions.  Heart: NSR.  No murmurs or rubs appreciated. No peripheral edema  Abdomen: Soft.  Non-tender.  No masses.  No HSM. No hernia  Extremities: Moves all appropriately.  Normal ROM for age. No lymphadenopathy.  Neuro: Oriented to PPT.  Normal mood. Normal affect.   Pelvic:    Vulva: Normal appearance.  No lesions.   Vagina: Third-degree cystocele second-degree apical prolapse second-degree rectocele   Support: Normal pelvic support.  Urethra No masses tenderness or scarring.   Meatus Normal size without lesions or prolapse.   Cervix: Surgically absent   Anus: Normal exam.  No lesions.   Perineum: Normal exam.  No lesions.         Bimanual    Uterus: Surgically absent    Adnexae: No masses.  Non-tender to palpation.    Cul-de-sac: Negative for abnormality.     Assessment:   G3P2 Patient Active Problem List   Diagnosis Date Noted   Anemia in stage 3b chronic kidney disease (HCC) 08/02/2022   Thrombocytopenia (HCC) 08/02/2022   Thrombocytopathia (HCC) 04/20/2022   History of IBS 06/03/2020   Mass of arm, left    MGUS (monoclonal gammopathy of unknown significance) 09/01/2018   Secondary hyperparathyroidism (HCC) 09/01/2018   B12 deficiency 08/19/2018   Atherosclerosis of aorta (HCC) 07/25/2018   Proteinuria 05/09/2018   Positive ANA (antinuclear antibody) 04/02/2018   Bilateral dry eyes 04/02/2018   History of hysterectomy 05/23/2015   Stage 3b chronic kidney disease (HCC) 05/23/2015   Essential hypertension 05/22/2015   Chronic constipation 05/22/2015   DDD (degenerative disc disease), lumbar 05/22/2015   DD (diverticular disease) 05/22/2015   Dyslipidemia 05/22/2015   History of cervical cancer 05/22/2015   Helicobacter pylori gastrointestinal  tract infection 05/22/2015   Blood glucose elevated 05/22/2015   Overweight (BMI 25.0-29.9) 05/22/2015   Perennial allergic rhinitis with seasonal variation 05/22/2015   Vitamin D deficiency 05/22/2015   Impingement syndrome of shoulder 01/14/2015    1. Pre-op exam   2. Pelvic relaxation disorder      Plan:   Orders: Meds ordered this encounter  Medications   metroNIDAZOLE (FLAGYL) 500 MG tablet    Sig: Take 1 tablet (500 mg total) by mouth 2 (two) times daily. Begin 5 days prior to scheduled surgery as directed.    Dispense:  10 tablet    Refill:  0   estradiol (ESTRACE) 0.1 MG/GM vaginal cream    Sig: Place 0.25 Applicatorfuls vaginally at bedtime.    Dispense:  90 g    Refill:  3     1.  A&P repair TOT  Pre-op discussions regarding Risks and Benefits of her scheduled surgery.  Anterior Repair I have discussed the procedure of anterior repair and Kelly placation.  I have informed the patient that this procedure often corrects or improves stress urinary incontinence, but that there is certainly no guarantee of her improvement.  The procedure itself was discussed.  The possible damage to the bowel, ureters or urethra was also discussed.  We have reviewed the repositioning of the bladder that often takes place at anterior repair and I have informed her that although unlikely, it is possible that a worsening of her incontinence could occur after this procedure.  I have also discussed with her the necessity of decreased lifting and physical activity following the procedure as well as the possibility that as she gets older, her stress urinary incontinence could slowly return.  The use of vaginal Estrogen or oral Estrogen as well as other medications in the role of both stress and bladder dysynergia incontinence were discussed.  I have discussed the complication of inability to void immediately following the procedure.  The patient is aware that she may go home using a Foley catheter or  may be taught the technique of self-catheterization should a complication develop.  All of her questions have been answered and I believe that she has an informed understanding of anterior repair/Kelly plication. Posterior  Repair Posterior repair was discussed with the patient.  The risks were reviewed and include:  possible damage to rectum and bowel, bleeding, infection and anesthesia.  The benefits were also discussed.  Post-op recovery with special attention to hospital stay and return to sexual function were specifically reviewed.  All her questions were answered, and I believe that she has an informed understanding of Posterior repair.  TOT I have discussed the procedure of tension free vaginal tape using the trans-obturator approach. (TOT).  I have informed the patient that this procedure often corrects or improves stress urinary incontinence.  For patients without urinary incontinence-this procedure is often performed to lower the risk of iatragenic urinary incontinence at the time of cystocele repair.The patient has been made aware that there is no guarantee of her improvement or the length of time her improvement will last.  The procedure itself was discussed in detail including possible damage to bowel, ureters, urethra and bladder.  I have informed her that the mesh is permanent.  The risk of extrusion of the mesh has also been reviewed.  The management of this complication has been discussed.  The risks of bleeding and infection were also reviewed.  I have specifically discussed the complication of inability to void following the procedure and the patient is aware that she may go home using a Foley catheter or using a self-catheterization technique.  In addition, I have discussed with the patient the use of cystoscopy to diagnose bladder injury should there be any question of this.    Regarding the polypropylene mesh and mid-urethral slings:  All of the patient's questions have been answered and  I believe she has an informed understanding of TOT and cystoscopy. I spent 35 minutes involved in the care of this patient preparing to see the patient by obtaining and reviewing her medical history (including labs, imaging tests and prior procedures), documenting clinical information in the electronic health record (EHR), counseling and coordinating care plans, writing and sending prescriptions, ordering tests or procedures and in direct communicating with the patient and medical staff discussing pertinent items from her history and physical exam.   Elonda Husky, M.D. 03/07/2023 10:26 AM

## 2023-03-07 NOTE — H&P (Signed)
PRE-OPERATIVE HISTORY AND PHYSICAL EXAM  PCP:  Alba Cory, MD Subjective:   HPI:  Morgan Livingston is a 77 y.o. G3P2.  No LMP recorded. Patient has had a hysterectomy.  She presents today for a pre-op discussion and PE.  She has the following symptoms: Pelvic pressure-vaginal prolapse  Review of Systems:   Constitutional: Denied constitutional symptoms, night sweats, recent illness, fatigue, fever, insomnia and weight loss.  Eyes: Denied eye symptoms, eye pain, photophobia, vision change and visual disturbance.  Ears/Nose/Throat/Neck: Denied ear, nose, throat or neck symptoms, hearing loss, nasal discharge, sinus congestion and sore throat.  Cardiovascular: Denied cardiovascular symptoms, arrhythmia, chest pain/pressure, edema, exercise intolerance, orthopnea and palpitations.  Respiratory: Denied pulmonary symptoms, asthma, pleuritic pain, productive sputum, cough, dyspnea and wheezing.  Gastrointestinal: Denied, gastro-esophageal reflux, melena, nausea and vomiting.  Genitourinary: Denied genitourinary symptoms including symptomatic vaginal discharge, pelvic relaxation issues, and urinary complaints.  Musculoskeletal: Denied musculoskeletal symptoms, stiffness, swelling, muscle weakness and myalgia.  Dermatologic: Denied dermatology symptoms, rash and scar.  Neurologic: Denied neurology symptoms, dizziness, headache, neck pain and syncope.  Psychiatric: Denied psychiatric symptoms, anxiety and depression.  Endocrine: Denied endocrine symptoms including hot flashes and night sweats.   OB History  Gravida Para Term Preterm AB Living  3 2          SAB IAB Ectopic Multiple Live Births               # Outcome Date GA Lbr Len/2nd Weight Sex Delivery Anes PTL Lv  3 Gravida           2 Para           1 Para             Past Medical History:  Diagnosis Date   Allergy    Cancer (HCC)    Uterine Cancer   Hyperlipidemia    Hypertension    Kidney stones 06/21/2018     Past Surgical History:  Procedure Laterality Date   ABDOMINAL HYSTERECTOMY     APPENDECTOMY     BREAST BIOPSY Right 04/09/2006   negative   BREAST BIOPSY Left 12/04/2017   fibroadenmatoid change with coarse calcs   COLONOSCOPY WITH PROPOFOL N/A 10/07/2018   Procedure: COLONOSCOPY WITH PROPOFOL;  Surgeon: Wyline Mood, MD;  Location: [facility] ENDOSCOPY;  Service: Gastroenterology;  Laterality: N/A;   COLONOSCOPY WITH PROPOFOL N/A 11/21/2021   Procedure: COLONOSCOPY WITH PROPOFOL;  Surgeon: Wyline Mood, MD;  Location: [facility] ENDOSCOPY;  Service: Gastroenterology;  Laterality: N/A;   LIPOMA EXCISION Left 04/15/2020   Procedure: EXCISION LIPOMA, left arm;  Surgeon: Duanne Guess, MD;  Location: [facility] ORS;  Service: General;  Laterality: Left;   XI ROBOTIC LAPAROSCOPIC ASSISTED APPENDECTOMY N/A 11/28/2021   Procedure: XI ROBOTIC LAPAROSCOPIC ASSISTED APPENDECTOMY;  Surgeon: Carolan Shiver, MD;  Location: [facility] ORS;  Service: General;  Laterality: N/A;      SOCIAL HISTORY:  Social History   Tobacco Use  Smoking Status Former   Packs/day: 1.00   Years: 2.00   Additional pack years: 0.00   Total pack years: 2.00   Types: Cigarettes   Quit date: 1963   Years since quitting: 61.3  Smokeless Tobacco Never  Tobacco Comments   smoking cessation materials not required   Social History   Substance and Sexual Activity  Alcohol Use No   Alcohol/week: 0.0 standard drinks of alcohol    Social History   Substance and Sexual Activity  Drug Use No  Family History  Problem Relation Age of Onset   Congestive Heart Failure Mother 74   Heart attack Father 72   Sinusitis Sister    Stomach cancer Sister    Uterine cancer Sister 47   Liver cancer Sister    Pancreatic cancer Sister    Dementia Sister        19   Heart attack Son    Breast cancer Neg Hx     ALLERGIES:  Patient has no known allergies.  MEDS:   Current Outpatient Medications on File Prior to Visit   Medication Sig Dispense Refill   acetaminophen (TYLENOL) 500 MG tablet Take 2 tablets (1,000 mg total) by mouth every 6 (six) hours as needed for mild pain or headache. 30 tablet 0   aspirin 81 MG tablet Take 81 mg by mouth daily.     Biotin 5 MG CAPS Take by mouth daily.     carboxymethylcellulose (REFRESH PLUS) 0.5 % SOLN Place 1 drop into both eyes daily.     cholecalciferol (VITAMIN D) 1000 UNITS tablet Take 1,000 Units by mouth daily.      ezetimibe (ZETIA) 10 MG tablet Take 1 tablet (10 mg total) by mouth daily. 90 tablet 3   ferrous sulfate 324 MG TBEC Take 65 mg by mouth daily with breakfast.     fexofenadine (ALLEGRA) 180 MG tablet Take 180 mg by mouth daily.     losartan-hydrochlorothiazide (HYZAAR) 100-12.5 MG tablet Take 1 tablet by mouth daily. 90 tablet 1   rosuvastatin (CRESTOR) 40 MG tablet Take 1 tablet (40 mg total) by mouth daily. 90 tablet 1   vitamin B-12 (CYANOCOBALAMIN) 1000 MCG tablet Take 1 tablet (1,000 mcg total) by mouth daily. (Patient taking differently: Take 1,000 mcg by mouth once a week. Wednesday) 90 tablet 1   vitamin E 1000 UNIT capsule Take 1,000 Units by mouth daily.     No current facility-administered medications on file prior to visit.    No orders of the defined types were placed in this encounter.    Physical examination BP (!) 149/86   Pulse (!) 59   Ht 5\' 5"  (1.651 m)   Wt 167 lb 11.2 oz (76.1 kg)   BMI 27.91 kg/m   General NAD, Conversant  HEENT Atraumatic; Op clear with mmm.  Normo-cephalic. Pupils reactive. Anicteric sclerae  Thyroid/Neck Smooth without nodularity or enlargement. Normal ROM.  Neck Supple.  Skin No rashes, lesions or ulceration. Normal palpated skin turgor. No nodularity.  Breasts: No masses or discharge.  Symmetric.  No axillary adenopathy.  Lungs: Clear to auscultation.No rales or wheezes. Normal Respiratory effort, no retractions.  Heart: NSR.  No murmurs or rubs appreciated. No peripheral edema  Abdomen: Soft.   Non-tender.  No masses.  No HSM. No hernia  Extremities: Moves all appropriately.  Normal ROM for age. No lymphadenopathy.  Neuro: Oriented to PPT.  Normal mood. Normal affect.   Pelvic:    Vulva: Normal appearance.  No lesions.   Vagina: Third-degree cystocele second-degree apical prolapse second-degree rectocele   Support: Normal pelvic support.   Urethra No masses tenderness or scarring.   Meatus Normal size without lesions or prolapse.   Cervix: Surgically absent   Anus: Normal exam.  No lesions.   Perineum: Normal exam.  No lesions.         Bimanual    Uterus: Surgically absent    Adnexae: No masses.  Non-tender to palpation.    Cul-de-sac: Negative for abnormality.  Assessment:   G3P2 Patient Active Problem List   Diagnosis Date Noted   Anemia in stage 3b chronic kidney disease (HCC) 08/02/2022   Thrombocytopenia (HCC) 08/02/2022   Thrombocytopathia (HCC) 04/20/2022   History of IBS 06/03/2020   Mass of arm, left    MGUS (monoclonal gammopathy of unknown significance) 09/01/2018   Secondary hyperparathyroidism (HCC) 09/01/2018   B12 deficiency 08/19/2018   Atherosclerosis of aorta (HCC) 07/25/2018   Proteinuria 05/09/2018   Positive ANA (antinuclear antibody) 04/02/2018   Bilateral dry eyes 04/02/2018   History of hysterectomy 05/23/2015   Stage 3b chronic kidney disease (HCC) 05/23/2015   Essential hypertension 05/22/2015   Chronic constipation 05/22/2015   DDD (degenerative disc disease), lumbar 05/22/2015   DD (diverticular disease) 05/22/2015   Dyslipidemia 05/22/2015   History of cervical cancer 05/22/2015   Helicobacter pylori gastrointestinal tract infection 05/22/2015   Blood glucose elevated 05/22/2015   Overweight (BMI 25.0-29.9) 05/22/2015   Perennial allergic rhinitis with seasonal variation 05/22/2015   Vitamin D deficiency 05/22/2015   Impingement syndrome of shoulder 01/14/2015    1. Pre-op exam   2. Pelvic relaxation disorder      Plan:    Orders: No orders of the defined types were placed in this encounter.    1.  A&P repair TOT

## 2023-03-07 NOTE — Progress Notes (Signed)
Patient presents today for a pre-op exam prior to A&P repair with TOT. She states no additional concerns today.

## 2023-03-29 ENCOUNTER — Encounter
Admission: RE | Admit: 2023-03-29 | Discharge: 2023-03-29 | Disposition: A | Discharge status: Home / Self Care | Payer: Medicare PPO | Source: Ambulatory Visit | Attending: Obstetrics and Gynecology | Admitting: Obstetrics and Gynecology

## 2023-03-29 DIAGNOSIS — I10 Essential (primary) hypertension: Secondary | ICD-10-CM

## 2023-03-29 DIAGNOSIS — Z01810 Encounter for preprocedural cardiovascular examination: Secondary | ICD-10-CM

## 2023-03-29 DIAGNOSIS — Z01812 Encounter for preprocedural laboratory examination: Secondary | ICD-10-CM

## 2023-03-29 HISTORY — DX: Gastro-esophageal reflux disease without esophagitis: K21.9

## 2023-03-29 HISTORY — DX: Personal history of other diseases of the digestive system: Z87.19

## 2023-03-29 HISTORY — DX: Other specified abnormal immunological findings in serum: R76.8

## 2023-03-29 HISTORY — DX: Atherosclerosis of aorta: I70.0

## 2023-03-29 HISTORY — DX: Anemia, unspecified: D64.9

## 2023-03-29 HISTORY — DX: Secondary hyperparathyroidism of renal origin: N25.81

## 2023-03-29 HISTORY — DX: Other intervertebral disc degeneration, lumbar region: M51.36

## 2023-03-29 HISTORY — DX: Chronic kidney disease, stage 3 unspecified: N18.30

## 2023-03-29 HISTORY — DX: Thrombocytopenia, unspecified: D69.6

## 2023-03-29 HISTORY — DX: Other intervertebral disc degeneration, lumbar region without mention of lumbar back pain or lower extremity pain: M51.369

## 2023-03-29 HISTORY — DX: Other specified bacterial intestinal infections: A04.8

## 2023-03-29 NOTE — Patient Instructions (Signed)
Your procedure is scheduled on:04-08-23 Monday  Report to the Registration Desk on the 1st floor of the [facility].Then proceed to the 2nd floor Surgery Desk To find out your arrival time, please call [phone] between 1PM - 3PM on:04-05-23 Friday If your arrival time is 6:00 am, do not arrive before that time as the [facility] entrance doors do not open until 6:00 am.  REMEMBER: Instructions that are not followed completely may result in serious medical risk, up to and including death; or upon the discretion of your surgeon and anesthesiologist your surgery may need to be rescheduled.  Do not eat food OR drink any liquids after midnight the night before surgery.  No gum chewing or hard candies.  One week prior to surgery:Last dose will be on 03-31-23 Stop Anti-inflammatories (NSAIDS) such as Advil, Aleve, Ibuprofen, Motrin, Naproxen, Naprosyn and Aspirin based products such as Excedrin, Goody's Powder, BC Powder.You may however, take Tylenol if needed for pain up until the day of surgery. Stop ANY OVER THE COUNTER supplements/vitamins 7 days prior to surgery (Biotin, Ferrous Sulfate, Vitamin E and D)   Continue taking all prescribed medications with the exception of the following: -Stop your 81 mg 7 days prior to surgery-Last dose will be on 03-31-23 Sunday  TAKE ONLY THESE MEDICATIONS THE MORNING OF SURGERY WITH A SIP OF WATER: -fexofenadine (ALLEGRA)   Continue your losartan-hydrochlorothiazide (HYZAAR) up until the day prior to surgery-Do NOT take the day of surgery  No Alcohol for 24 hours before or after surgery.  No Smoking including e-cigarettes for 24 hours before surgery.  No chewable tobacco products for at least 6 hours before surgery.  No nicotine patches on the day of surgery.  Do not use any "recreational" drugs for at least a week (preferably 2 weeks) before your surgery.  Please be advised that the combination of cocaine and anesthesia may have negative outcomes,  up to and including death. If you test positive for cocaine, your surgery will be cancelled.  On the morning of surgery brush your teeth with toothpaste and water, you may rinse your mouth with mouthwash if you wish. Do not swallow any toothpaste or mouthwash.  Do not wear jewelry, make-up, hairpins, clips or nail polish.  Do not wear lotions, powders, or perfumes.   Do not shave body hair from the neck down 48 hours before surgery.  Contact lenses, hearing aids and dentures may not be worn into surgery.  Do not bring valuables to the hospital. [facility] is not responsible for any missing/lost belongings or valuables.    Notify your doctor if there is any change in your medical condition (cold, fever, infection).  Wear comfortable clothing (specific to your surgery type) to the hospital.  After surgery, you can help prevent lung complications by doing breathing exercises.  Take deep breaths and cough every 1-2 hours. Your doctor may order a device called an Incentive Spirometer to help you take deep breaths. When coughing or sneezing, hold a pillow firmly against your incision with both hands. This is called "splinting." Doing this helps protect your incision. It also decreases belly discomfort.  If you are being admitted to the hospital overnight, leave your suitcase in the car. After surgery it may be brought to your room.  In case of increased patient census, it may be necessary for you, the patient, to continue your postoperative care in the Same Day Surgery department.  If you are being discharged the day of surgery, you will  not be allowed to drive home. You will need a responsible individual to drive you home and stay with you for 24 hours after surgery.   If you are taking public transportation, you will need to have a responsible individual with you.  Please call the Pre-admissions Testing Dept. at [phone] if you have any questions about these  instructions.  Surgery Visitation Policy:  Patients having surgery or a procedure may have two visitors.  Children under the age of 74 must have an adult with them who is not the patient.

## 2023-04-02 ENCOUNTER — Inpatient Hospital Stay: Payer: Medicare PPO | Attending: Oncology

## 2023-04-02 ENCOUNTER — Encounter
Admission: RE | Admit: 2023-04-02 | Discharge: 2023-04-02 | Disposition: A | Discharge status: Home / Self Care | Payer: Medicare PPO | Source: Ambulatory Visit | Attending: Obstetrics and Gynecology | Admitting: Obstetrics and Gynecology

## 2023-04-02 DIAGNOSIS — N8189 Other female genital prolapse: Secondary | ICD-10-CM | POA: Insufficient documentation

## 2023-04-02 DIAGNOSIS — E538 Deficiency of other specified B group vitamins: Secondary | ICD-10-CM

## 2023-04-02 DIAGNOSIS — Z01818 Encounter for other preprocedural examination: Secondary | ICD-10-CM | POA: Diagnosis not present

## 2023-04-02 DIAGNOSIS — Z01812 Encounter for preprocedural laboratory examination: Secondary | ICD-10-CM

## 2023-04-02 DIAGNOSIS — I10 Essential (primary) hypertension: Secondary | ICD-10-CM | POA: Insufficient documentation

## 2023-04-02 DIAGNOSIS — Z01810 Encounter for preprocedural cardiovascular examination: Secondary | ICD-10-CM

## 2023-04-02 LAB — TYPE AND SCREEN
ABO/RH(D): A POS
Antibody Screen: NEGATIVE

## 2023-04-02 LAB — BASIC METABOLIC PANEL
Anion gap: 7 (ref 5–15)
BUN: 15 mg/dL (ref 8–23)
CO2: 27 mmol/L (ref 22–32)
Calcium: 9.2 mg/dL (ref 8.9–10.3)
Chloride: 106 mmol/L (ref 98–111)
Creatinine, Ser: 1.4 mg/dL — ABNORMAL HIGH (ref 0.44–1.00)
GFR, Estimated: 39 mL/min — ABNORMAL LOW (ref >60)
Glucose, Bld: 107 mg/dL — ABNORMAL HIGH (ref 70–99)
Potassium: 3.5 mmol/L (ref 3.5–5.1)
Sodium: 140 mmol/L (ref 135–145)

## 2023-04-02 MED ORDER — CYANOCOBALAMIN 1000 MCG/ML IJ SOLN
1000.0000 ug | Freq: Once | INTRAMUSCULAR | Status: AC
  Administered 2023-04-02: 1000 ug via INTRAMUSCULAR
  Filled 2023-04-02: qty 1

## 2023-04-08 ENCOUNTER — Other Ambulatory Visit: Payer: Self-pay

## 2023-04-08 ENCOUNTER — Encounter
Admission: RE | Disposition: A | Discharge status: Home / Self Care | Payer: Self-pay | Source: Home / Self Care | Attending: Obstetrics and Gynecology

## 2023-04-08 ENCOUNTER — Ambulatory Visit: Payer: Medicare PPO | Admitting: Urgent Care

## 2023-04-08 ENCOUNTER — Encounter: Payer: Self-pay | Admitting: Obstetrics and Gynecology

## 2023-04-08 ENCOUNTER — Ambulatory Visit
Admission: RE | Admit: 2023-04-08 | Discharge: 2023-04-08 | Disposition: A | Discharge status: Home / Self Care | Payer: Medicare PPO | Attending: Obstetrics and Gynecology | Admitting: Obstetrics and Gynecology

## 2023-04-08 DIAGNOSIS — I129 Hypertensive chronic kidney disease with stage 1 through stage 4 chronic kidney disease, or unspecified chronic kidney disease: Secondary | ICD-10-CM | POA: Insufficient documentation

## 2023-04-08 DIAGNOSIS — Z79899 Other long term (current) drug therapy: Secondary | ICD-10-CM | POA: Insufficient documentation

## 2023-04-08 DIAGNOSIS — N183 Chronic kidney disease, stage 3 unspecified: Secondary | ICD-10-CM | POA: Diagnosis not present

## 2023-04-08 DIAGNOSIS — E785 Hyperlipidemia, unspecified: Secondary | ICD-10-CM | POA: Diagnosis not present

## 2023-04-08 DIAGNOSIS — N8189 Other female genital prolapse: Secondary | ICD-10-CM | POA: Diagnosis not present

## 2023-04-08 DIAGNOSIS — Z87891 Personal history of nicotine dependence: Secondary | ICD-10-CM | POA: Insufficient documentation

## 2023-04-08 DIAGNOSIS — M1990 Unspecified osteoarthritis, unspecified site: Secondary | ICD-10-CM | POA: Insufficient documentation

## 2023-04-08 DIAGNOSIS — K219 Gastro-esophageal reflux disease without esophagitis: Secondary | ICD-10-CM | POA: Diagnosis not present

## 2023-04-08 DIAGNOSIS — N813 Complete uterovaginal prolapse: Secondary | ICD-10-CM | POA: Diagnosis not present

## 2023-04-08 DIAGNOSIS — I700 Atherosclerosis of aorta: Secondary | ICD-10-CM | POA: Diagnosis not present

## 2023-04-08 HISTORY — PX: ANTERIOR AND POSTERIOR REPAIR: SHX5121

## 2023-04-08 HISTORY — PX: BLADDER SUSPENSION: SHX72

## 2023-04-08 LAB — ABO/RH: ABO/RH(D): A POS

## 2023-04-08 SURGERY — ANTERIOR (CYSTOCELE) AND POSTERIOR REPAIR (RECTOCELE)
Anesthesia: General | Site: Cervix

## 2023-04-08 MED ORDER — LIDOCAINE HCL (PF) 2 % IJ SOLN
INTRAMUSCULAR | Status: AC
  Filled 2023-04-08: qty 5

## 2023-04-08 MED ORDER — VASOPRESSIN 20 UNIT/ML IV SOLN
INTRAVENOUS | Status: DC | PRN
  Administered 2023-04-08: 26 mL via INTRAMUSCULAR

## 2023-04-08 MED ORDER — PROPOFOL 10 MG/ML IV BOLUS
INTRAVENOUS | Status: AC
  Filled 2023-04-08: qty 40

## 2023-04-08 MED ORDER — SODIUM CHLORIDE (PF) 0.9 % IJ SOLN
INTRAMUSCULAR | Status: AC
  Filled 2023-04-08: qty 50

## 2023-04-08 MED ORDER — ACETAMINOPHEN 10 MG/ML IV SOLN: 1000.0000 mg | Freq: Once | INTRAVENOUS | Status: DC | PRN

## 2023-04-08 MED ORDER — DEXTROSE IN LACTATED RINGERS 5 % IV SOLN: INTRAVENOUS | Status: DC

## 2023-04-08 MED ORDER — CEFAZOLIN SODIUM-DEXTROSE 2-4 GM/100ML-% IV SOLN
2.0000 g | INTRAVENOUS | Status: AC
  Administered 2023-04-08: 2 g via INTRAVENOUS

## 2023-04-08 MED ORDER — CHLORHEXIDINE GLUCONATE 0.12 % MT SOLN
15.0000 mL | Freq: Once | OROMUCOSAL | Status: AC
  Administered 2023-04-08: 15 mL via OROMUCOSAL

## 2023-04-08 MED ORDER — CHLORHEXIDINE GLUCONATE 0.12 % MT SOLN
OROMUCOSAL | Status: AC
  Filled 2023-04-08: qty 15

## 2023-04-08 MED ORDER — LIDOCAINE HCL (CARDIAC) PF 100 MG/5ML IV SOSY
PREFILLED_SYRINGE | INTRAVENOUS | Status: DC | PRN
  Administered 2023-04-08: 60 mg via INTRAVENOUS

## 2023-04-08 MED ORDER — EPHEDRINE SULFATE (PRESSORS) 50 MG/ML IJ SOLN
INTRAMUSCULAR | Status: AC
  Filled 2023-04-08: qty 1

## 2023-04-08 MED ORDER — POVIDONE-IODINE 10 % EX SWAB
2.0000 | Freq: Once | CUTANEOUS | Status: AC
  Administered 2023-04-08: 2 via TOPICAL

## 2023-04-08 MED ORDER — OXYCODONE HCL 5 MG PO TABS: 5.0000 mg | ORAL_TABLET | Freq: Once | ORAL | Status: DC | PRN

## 2023-04-08 MED ORDER — VASOPRESSIN 20 UNIT/ML IV SOLN
INTRAVENOUS | Status: AC
  Filled 2023-04-08: qty 1

## 2023-04-08 MED ORDER — ACETAMINOPHEN 10 MG/ML IV SOLN
INTRAVENOUS | Status: AC
  Filled 2023-04-08: qty 100

## 2023-04-08 MED ORDER — FENTANYL CITRATE (PF) 100 MCG/2ML IJ SOLN: 25.0000 ug | INTRAMUSCULAR | Status: DC | PRN

## 2023-04-08 MED ORDER — HYDROCODONE-ACETAMINOPHEN 5-325 MG PO TABS
1.0000 | ORAL_TABLET | Freq: Four times a day (QID) | ORAL | 0 refills | Status: DC | PRN
Start: 2023-04-08 — End: 2023-04-15

## 2023-04-08 MED ORDER — ONDANSETRON HCL 4 MG/2ML IJ SOLN
INTRAMUSCULAR | Status: DC | PRN
  Administered 2023-04-08: 4 mg via INTRAVENOUS

## 2023-04-08 MED ORDER — OXYCODONE-ACETAMINOPHEN 5-325 MG PO TABS
ORAL_TABLET | ORAL | Status: AC
  Filled 2023-04-08: qty 1

## 2023-04-08 MED ORDER — PROPOFOL 10 MG/ML IV BOLUS
INTRAVENOUS | Status: DC | PRN
  Administered 2023-04-08: 110 mg via INTRAVENOUS

## 2023-04-08 MED ORDER — DEXAMETHASONE SODIUM PHOSPHATE 10 MG/ML IJ SOLN
INTRAMUSCULAR | Status: AC
  Filled 2023-04-08: qty 1

## 2023-04-08 MED ORDER — LACTATED RINGERS IV SOLN: INTRAVENOUS | Status: DC

## 2023-04-08 MED ORDER — FENTANYL CITRATE (PF) 100 MCG/2ML IJ SOLN
INTRAMUSCULAR | Status: DC | PRN
  Administered 2023-04-08 (×4): 25 ug via INTRAVENOUS

## 2023-04-08 MED ORDER — OXYCODONE-ACETAMINOPHEN 5-325 MG PO TABS
1.0000 | ORAL_TABLET | ORAL | Status: DC | PRN
  Administered 2023-04-08 (×2): 1 via ORAL

## 2023-04-08 MED ORDER — MIDAZOLAM HCL 2 MG/2ML IJ SOLN
INTRAMUSCULAR | Status: AC
  Filled 2023-04-08: qty 2

## 2023-04-08 MED ORDER — FENTANYL CITRATE (PF) 100 MCG/2ML IJ SOLN
INTRAMUSCULAR | Status: AC
  Filled 2023-04-08: qty 2

## 2023-04-08 MED ORDER — IBUPROFEN 600 MG PO TABS
600.0000 mg | ORAL_TABLET | Freq: Four times a day (QID) | ORAL | Status: DC
  Administered 2023-04-08: 600 mg via ORAL

## 2023-04-08 MED ORDER — EPHEDRINE SULFATE (PRESSORS) 50 MG/ML IJ SOLN
INTRAMUSCULAR | Status: DC | PRN
  Administered 2023-04-08: 5 mg via INTRAVENOUS

## 2023-04-08 MED ORDER — MIDAZOLAM HCL 2 MG/2ML IJ SOLN
INTRAMUSCULAR | Status: DC | PRN
  Administered 2023-04-08: 2 mg via INTRAVENOUS

## 2023-04-08 MED ORDER — EPHEDRINE 5 MG/ML INJ
INTRAVENOUS | Status: AC
  Filled 2023-04-08: qty 5

## 2023-04-08 MED ORDER — LIDOCAINE-EPINEPHRINE 1 %-1:100000 IJ SOLN
INTRAMUSCULAR | Status: AC
  Filled 2023-04-08: qty 1

## 2023-04-08 MED ORDER — FAMOTIDINE 20 MG PO TABS
ORAL_TABLET | ORAL | Status: AC
  Filled 2023-04-08: qty 1

## 2023-04-08 MED ORDER — OXYCODONE HCL 5 MG/5ML PO SOLN: 5.0000 mg | Freq: Once | ORAL | Status: DC | PRN

## 2023-04-08 MED ORDER — SIMETHICONE 80 MG PO CHEW: 80.0000 mg | CHEWABLE_TABLET | Freq: Four times a day (QID) | ORAL | Status: DC | PRN

## 2023-04-08 MED ORDER — ONDANSETRON HCL 4 MG/2ML IJ SOLN
INTRAMUSCULAR | Status: AC
  Filled 2023-04-08: qty 2

## 2023-04-08 MED ORDER — ESTROGENS CONJUGATED 0.625 MG/GM VA CREA
TOPICAL_CREAM | VAGINAL | Status: AC
  Filled 2023-04-08: qty 30

## 2023-04-08 MED ORDER — PROPOFOL 500 MG/50ML IV EMUL
INTRAVENOUS | Status: DC | PRN
  Administered 2023-04-08: 55 ug/kg/min via INTRAVENOUS

## 2023-04-08 MED ORDER — DEXAMETHASONE SODIUM PHOSPHATE 10 MG/ML IJ SOLN
INTRAMUSCULAR | Status: DC | PRN
  Administered 2023-04-08: 8 mg via INTRAVENOUS

## 2023-04-08 MED ORDER — ACETAMINOPHEN 10 MG/ML IV SOLN
INTRAVENOUS | Status: DC | PRN
  Administered 2023-04-08: 1000 mg via INTRAVENOUS

## 2023-04-08 MED ORDER — CEFAZOLIN SODIUM-DEXTROSE 2-4 GM/100ML-% IV SOLN
INTRAVENOUS | Status: AC
  Filled 2023-04-08: qty 100

## 2023-04-08 MED ORDER — IBUPROFEN 600 MG PO TABS
ORAL_TABLET | ORAL | Status: AC
  Filled 2023-04-08: qty 1

## 2023-04-08 MED ORDER — ORAL CARE MOUTH RINSE: 15.0000 mL | Freq: Once | OROMUCOSAL | Status: AC

## 2023-04-08 MED ORDER — 0.9 % SODIUM CHLORIDE (POUR BTL) OPTIME
TOPICAL | Status: DC | PRN
  Administered 2023-04-08: 500 mL

## 2023-04-08 MED ORDER — FAMOTIDINE 20 MG PO TABS
20.0000 mg | ORAL_TABLET | Freq: Once | ORAL | Status: AC
  Administered 2023-04-08: 20 mg via ORAL

## 2023-04-08 MED ORDER — ONDANSETRON HCL 4 MG/2ML IJ SOLN
4.0000 mg | Freq: Once | INTRAMUSCULAR | Status: AC | PRN
  Administered 2023-04-08: 4 mg via INTRAVENOUS

## 2023-04-08 SURGICAL SUPPLY — 66 items
ADH LQ OCL WTPRF AMP STRL LF (MISCELLANEOUS)
ADH SKN CLS APL DERMABOND .7 (GAUZE/BANDAGES/DRESSINGS) ×2
ADHESIVE MASTISOL STRL (MISCELLANEOUS) IMPLANT
BAG DECANTER FOR FLEXI CONT (MISCELLANEOUS) ×2 IMPLANT
BAG DRN 6X6 URO DRBLE ADPR (MISCELLANEOUS) ×2
BAG DRN RND TRDRP ANRFLXCHMBR (UROLOGICAL SUPPLIES) ×2
BAG URINE DRAIN 2000ML AR STRL (UROLOGICAL SUPPLIES) ×2 IMPLANT
BAG URINE DRAIN UROCATCH STRL (MISCELLANEOUS) ×2 IMPLANT
BLADE SURG 15 STRL LF DISP TIS (BLADE) ×2 IMPLANT
BLADE SURG 15 STRL SS (BLADE) ×2
BLADE SURG SZ10 CARB STEEL (BLADE) ×2 IMPLANT
BNDG GAUZE DERMACEA FLUFF 4 (GAUZE/BANDAGES/DRESSINGS) IMPLANT
BNDG GZE DERMACEA 4 6PLY (GAUZE/BANDAGES/DRESSINGS)
CATH FOLEY 2WAY  5CC 16FR (CATHETERS)
CATH FOLEY 2WAY 5CC 16FR (CATHETERS)
CATH FOLEY 2WAY SIL 16X30 (CATHETERS) ×2 IMPLANT
CATH URTH 16FR FL 2W BLN LF (CATHETERS) IMPLANT
CLEANER CAUTERY TIP 5X5 PAD (MISCELLANEOUS) IMPLANT
DERMABOND ADVANCED .7 DNX12 (GAUZE/BANDAGES/DRESSINGS) ×2 IMPLANT
DRAPE PERI LITHO V/GYN (MISCELLANEOUS) ×2 IMPLANT
DRAPE UNDER BUTTOCK W/FLU (DRAPES) ×2 IMPLANT
DRAPE UTILITY 15X26 TOWEL STRL (DRAPES) ×2 IMPLANT
ELECT CAUTERY BLADE 6.4 (BLADE) ×2 IMPLANT
ELECT REM PT RETURN 9FT ADLT (ELECTROSURGICAL) ×2
ELECTRODE REM PT RTRN 9FT ADLT (ELECTROSURGICAL) ×2 IMPLANT
GAUZE 4X4 16PLY ~~LOC~~+RFID DBL (SPONGE) ×8 IMPLANT
GAUZE PACK 2X3YD (PACKING) ×2 IMPLANT
GLOVE BIO SURGEON STRL SZ 6.5 (GLOVE) ×2 IMPLANT
GLOVE BIO SURGEON STRL SZ8 (GLOVE) ×2 IMPLANT
GLOVE INDICATOR 7.0 STRL GRN (GLOVE) ×2 IMPLANT
GLOVE PI ORTHO PRO STRL 7.5 (GLOVE) ×4 IMPLANT
GOWN STRL REUS W/ TWL LRG LVL3 (GOWN DISPOSABLE) ×4 IMPLANT
GOWN STRL REUS W/ TWL XL LVL3 (GOWN DISPOSABLE) ×2 IMPLANT
GOWN STRL REUS W/TWL LRG LVL3 (GOWN DISPOSABLE) ×4
GOWN STRL REUS W/TWL XL LVL3 (GOWN DISPOSABLE) ×2
IV LACTATED RINGERS 1000ML (IV SOLUTION) IMPLANT
MANIFOLD NEPTUNE II (INSTRUMENTS) ×2 IMPLANT
NDL HYPO 22X1.5 SAFETY MO (MISCELLANEOUS) ×2 IMPLANT
NDL SAFETY ECLIP 18X1.5 (MISCELLANEOUS) ×2 IMPLANT
NDL SPNL 22GX3.5 QUINCKE BK (NEEDLE) ×2 IMPLANT
NEEDLE HYPO 22X1.5 SAFETY MO (MISCELLANEOUS) ×2 IMPLANT
NEEDLE SPNL 22GX3.5 QUINCKE BK (NEEDLE) ×2 IMPLANT
NS IRRIG 500ML POUR BTL (IV SOLUTION) ×2 IMPLANT
PACK BASIN MINOR ARMC (MISCELLANEOUS) ×2 IMPLANT
PAD OB MATERNITY 4.3X12.25 (PERSONAL CARE ITEMS) ×2 IMPLANT
PAD PREP OB/GYN DISP 24X41 (PERSONAL CARE ITEMS) ×2 IMPLANT
RETRACTOR PHONTONGUIDE ADAPT (ADAPTER) IMPLANT
SCRUB CHG 4% DYNA-HEX 4OZ (MISCELLANEOUS) ×2 IMPLANT
SET CYSTO W/LG BORE CLAMP LF (SET/KITS/TRAYS/PACK) IMPLANT
SLING TRANSOBTURATOR OBTRYX (Sling) ×2 IMPLANT
STRAP SAFETY 5IN WIDE (MISCELLANEOUS) ×2 IMPLANT
STRIP CLOSURE SKIN 1/2X4 (GAUZE/BANDAGES/DRESSINGS) ×2 IMPLANT
SURGILUBE 2OZ TUBE FLIPTOP (MISCELLANEOUS) ×2 IMPLANT
SUT VIC AB 0 CT1 27 (SUTURE) ×4
SUT VIC AB 0 CT1 27XCR 8 STRN (SUTURE) ×4 IMPLANT
SUT VIC AB 0 CT1 36 (SUTURE) ×2 IMPLANT
SUT VIC AB 2-0 CT1 (SUTURE) ×4 IMPLANT
SUT VICRYL 0 UR6 27IN ABS (SUTURE) ×4 IMPLANT
SUT VICRYL+ 3-0 36IN CT-1 (SUTURE) ×2 IMPLANT
SYR 10ML LL (SYRINGE) ×4 IMPLANT
SYR CONTROL 10ML LL (SYRINGE) ×2 IMPLANT
SYR TOOMEY IRRIG 70ML (MISCELLANEOUS) ×2
SYRINGE TOOMEY IRRIG 70ML (MISCELLANEOUS) ×2 IMPLANT
TOWEL OR 17X26 4PK STRL BLUE (TOWEL DISPOSABLE) ×2 IMPLANT
TRAP FLUID SMOKE EVACUATOR (MISCELLANEOUS) ×2 IMPLANT
WATER STERILE IRR 500ML POUR (IV SOLUTION) ×2 IMPLANT

## 2023-04-08 NOTE — Transfer of Care (Signed)
Immediate Anesthesia Transfer of Care Note  Patient: Morgan Livingston  Procedure(s) Performed: ANTERIOR (CYSTOCELE) AND POSTERIOR REPAIR (RECTOCELE) (Cervix) TOT (Bilateral: Cervix)  Patient Location: PACU  Anesthesia Type:General  Level of Consciousness: sedated and drowsy  Airway & Oxygen Therapy: Patient Spontanous Breathing and Patient connected to face mask oxygen  Post-op Assessment: Report given to RN and Post -op Vital signs reviewed and stable  Post vital signs: stable  Last Vitals:  Vitals Value Taken Time  BP 141/73 04/08/23 0927  Temp 36.3 C 04/08/23 0916  Pulse 70 04/08/23 0929  Resp 14 04/08/23 0929  SpO2 99 % 04/08/23 0929  Vitals shown include unvalidated device data.  Last Pain:  Vitals:   04/08/23 0916  TempSrc:   PainSc: Asleep      Patients Stated Pain Goal: 0 (04/08/23 0631)  Complications: No notable events documented.

## 2023-04-08 NOTE — H&P (Signed)
ned                                                     PRE-OPERATIVE HISTORY AND PHYSICAL EXAM   PCP:  Alba Cory, MD Subjective:    HPI:  Morgan Livingston is a 77 y.o. G3P2.  No LMP recorded. Patient has had a hysterectomy.  She presents today for a pre-op discussion and PE.   She has the following symptoms: Pelvic pressure-vaginal prolapse   Review of Systems:    Constitutional: Denied constitutional symptoms, night sweats, recent illness, fatigue, fever, insomnia and weight loss.  Eyes: Denied eye symptoms, eye pain, photophobia, vision change and visual disturbance.  Ears/Nose/Throat/Neck: Denied ear, nose, throat or neck symptoms, hearing loss, nasal discharge, sinus congestion and sore throat.  Cardiovascular: Denied cardiovascular symptoms, arrhythmia, chest pain/pressure, edema, exercise intolerance, orthopnea and palpitations.  Respiratory: Denied pulmonary symptoms, asthma, pleuritic pain, productive sputum, cough, dyspnea and wheezing.  Gastrointestinal: Denied, gastro-esophageal reflux, melena, nausea and vomiting.  Genitourinary: Denied genitourinary symptoms including symptomatic vaginal discharge, pelvic relaxation issues, and urinary complaints.  Musculoskeletal: Denied musculoskeletal symptoms, stiffness, swelling, muscle weakness and myalgia.  Dermatologic: Denied dermatology symptoms, rash and scar.  Neurologic: Denied neurology symptoms, dizziness, headache, neck pain and syncope.  Psychiatric: Denied psychiatric symptoms, anxiety and depression.  Endocrine: Denied endocrine symptoms including hot flashes and night sweats.                    OB History  Gravida Para Term Preterm AB Living  3 2          SAB IAB Ectopic Multiple Live Births                      # Outcome Date GA Lbr Len/2nd Weight Sex Delivery Anes PTL Lv  3 Gravida                    2 Para                    1 Para                            Past Medical History:  Diagnosis Date    Allergy     Cancer (HCC)      Uterine Cancer   Hyperlipidemia     Hypertension     Kidney stones 06/21/2018           Past Surgical History:  Procedure Laterality Date   ABDOMINAL HYSTERECTOMY       APPENDECTOMY       BREAST BIOPSY Right 04/09/2006    negative   BREAST BIOPSY Left 12/04/2017    fibroadenmatoid change with coarse calcs   COLONOSCOPY WITH PROPOFOL N/A 10/07/2018    Procedure: COLONOSCOPY WITH PROPOFOL;  Surgeon: Wyline Mood, MD;  Location: [facility] ENDOSCOPY;  Service: Gastroenterology;  Laterality: N/A;   COLONOSCOPY WITH PROPOFOL N/A 11/21/2021    Procedure: COLONOSCOPY WITH PROPOFOL;  Surgeon: Wyline Mood, MD;  Location: [facility] ENDOSCOPY;  Service: Gastroenterology;  Laterality: N/A;   LIPOMA EXCISION Left 04/15/2020    Procedure: EXCISION LIPOMA, left arm;  Surgeon: Duanne Guess, MD;  Location: [facility] ORS;  Service: General;  Laterality: Left;   XI ROBOTIC LAPAROSCOPIC ASSISTED  APPENDECTOMY N/A 11/28/2021    Procedure: XI ROBOTIC LAPAROSCOPIC ASSISTED APPENDECTOMY;  Surgeon: Carolan Shiver, MD;  Location: [facility] ORS;  Service: General;  Laterality: N/A;       SOCIAL HISTORY:   Social History        Tobacco Use  Smoking Status Former   Packs/day: 1.00   Years: 2.00   Additional pack years: 0.00   Total pack years: 2.00   Types: Cigarettes   Quit date: 1963   Years since quitting: 61.3  Smokeless Tobacco Never  Tobacco Comments    smoking cessation materials not required    Social History        Substance and Sexual Activity  Alcohol Use No   Alcohol/week: 0.0 standard drinks of alcohol     Social History       Substance and Sexual Activity  Drug Use No           Family History  Problem Relation Age of Onset   Congestive Heart Failure Mother 103   Heart attack Father 57   Sinusitis Sister     Stomach cancer Sister     Uterine cancer Sister 77   Liver cancer Sister     Pancreatic cancer Sister     Dementia Sister          42    Heart attack Son     Breast cancer Neg Hx        ALLERGIES:  Patient has no known allergies.   MEDS:                    Current Outpatient Medications on File Prior to Visit  Medication Sig Dispense Refill   acetaminophen (TYLENOL) 500 MG tablet Take 2 tablets (1,000 mg total) by mouth every 6 (six) hours as needed for mild pain or headache. 30 tablet 0   aspirin 81 MG tablet Take 81 mg by mouth daily.       Biotin 5 MG CAPS Take by mouth daily.       carboxymethylcellulose (REFRESH PLUS) 0.5 % SOLN Place 1 drop into both eyes daily.       cholecalciferol (VITAMIN D) 1000 UNITS tablet Take 1,000 Units by mouth daily.        ezetimibe (ZETIA) 10 MG tablet Take 1 tablet (10 mg total) by mouth daily. 90 tablet 3   ferrous sulfate 324 MG TBEC Take 65 mg by mouth daily with breakfast.       fexofenadine (ALLEGRA) 180 MG tablet Take 180 mg by mouth daily.       losartan-hydrochlorothiazide (HYZAAR) 100-12.5 MG tablet Take 1 tablet by mouth daily. 90 tablet 1   rosuvastatin (CRESTOR) 40 MG tablet Take 1 tablet (40 mg total) by mouth daily. 90 tablet 1   vitamin B-12 (CYANOCOBALAMIN) 1000 MCG tablet Take 1 tablet (1,000 mcg total) by mouth daily. (Patient taking differently: Take 1,000 mcg by mouth once a week. Wednesday) 90 tablet 1   vitamin E 1000 UNIT capsule Take 1,000 Units by mouth daily.        No current facility-administered medications on file prior to visit.      No orders of the defined types were placed in this encounter.     Physical examination BP (!) 149/86   Pulse (!) 59   Ht 5\' 5"  (1.651 m)   Wt 167 lb 11.2 oz (76.1 kg)   BMI 27.91 kg/m    General NAD, Conversant  HEENT Atraumatic; Op  clear with mmm.  Normo-cephalic. Pupils reactive. Anicteric sclerae  Thyroid/Neck Smooth without nodularity or enlargement. Normal ROM.  Neck Supple.  Skin No rashes, lesions or ulceration. Normal palpated skin turgor. No nodularity.  Breasts: No masses or discharge.  Symmetric.  No  axillary adenopathy.  Lungs: Clear to auscultation.No rales or wheezes. Normal Respiratory effort, no retractions.  Heart: NSR.  No murmurs or rubs appreciated. No peripheral edema  Abdomen: Soft.  Non-tender.  No masses.  No HSM. No hernia  Extremities: Moves all appropriately.  Normal ROM for age. No lymphadenopathy.  Neuro: Oriented to PPT.  Normal mood. Normal affect.            Pelvic:    Vulva: Normal appearance.  No lesions.    Vagina: Third-degree cystocele second-degree apical prolapse second-degree rectocele    Support: Normal pelvic support.    Urethra No masses tenderness or scarring.    Meatus Normal size without lesions or prolapse.    Cervix: Surgically absent    Anus: Normal exam.  No lesions.    Perineum: Normal exam.  No lesions.          Bimanual    Uterus: Surgically absent      Adnexae: No masses.  Non-tender to palpation.      Cul-de-sac: Negative for abnormality.        Assessment:    G3P2     Patient Active Problem List    Diagnosis Date Noted   Anemia in stage 3b chronic kidney disease (HCC) 08/02/2022   Thrombocytopenia (HCC) 08/02/2022   Thrombocytopathia (HCC) 04/20/2022   History of IBS 06/03/2020   Mass of arm, left     MGUS (monoclonal gammopathy of unknown significance) 09/01/2018   Secondary hyperparathyroidism (HCC) 09/01/2018   B12 deficiency 08/19/2018   Atherosclerosis of aorta (HCC) 07/25/2018   Proteinuria 05/09/2018   Positive ANA (antinuclear antibody) 04/02/2018   Bilateral dry eyes 04/02/2018   History of hysterectomy 05/23/2015   Stage 3b chronic kidney disease (HCC) 05/23/2015   Essential hypertension 05/22/2015   Chronic constipation 05/22/2015   DDD (degenerative disc disease), lumbar 05/22/2015   DD (diverticular disease) 05/22/2015   Dyslipidemia 05/22/2015   History of cervical cancer 05/22/2015   Helicobacter pylori gastrointestinal tract infection 05/22/2015   Blood glucose elevated 05/22/2015   Overweight (BMI  25.0-29.9) 05/22/2015   Perennial allergic rhinitis with seasonal variation 05/22/2015   Vitamin D deficiency 05/22/2015   Impingement syndrome of shoulder 01/14/2015      1. Pre-op exam   2. Pelvic relaxation disorder         Plan:    Orders: No orders of the defined types were placed in this encounter.     1.  A&P repair TOT 2. Discussed home with and without catheter

## 2023-04-08 NOTE — Op Note (Signed)
@  LOGO@    OPERATIVE NOTE 04/08/2023 9:23 AM  PRE-OPERATIVE DIAGNOSIS:  1) Pelvic Prolapse  POST-OPERATIVE DIAGNOSIS:  * No Diagnosis Codes entered *  OPERATION: Procedure(s) (LRB): ANTERIOR (CYSTOCELE) AND POSTERIOR REPAIR (RECTOCELE) (N/A) TOT (Bilateral)  SURGEON(S): Surgeon(s) and Role:    Linzie Collin, MD - Primary    * Hildred Laser, MD   ANESTHESIA: Choice  ESTIMATED BLOOD LOSS: 25 mL  OPERATIVE FINDINGS:   SPECIMEN: * No specimens in log *  COMPLICATIONS: None  DRAINS: none  DISPOSITION: Stable to recovery room  PROCEDURE: The vaginal mucosa beginning at the vaginal cuff and overlying the bladder, was grasped with Allis clamps and injected with a dilute Pitressin solution in the midline. A midline incision was made to the level of the urethra. The vaginal mucosa was dissected laterally from the underlying attenuated fascia. A Foley catheter was placed within the bladder and the bladder was emptied. Clear urine was noted. The obturator foramina were identified in the usual manner bilaterally and marked with a marking pen the skin and subcutaneous tissues were injected with a dilute Pitressin solution. Stab incisions were made and the TOT trochars were placed through these incisions onto the operator's finger in the vagina which was retracted and bladder medially. The vaginal tape was then placed on the trochars and reversed through these incisions. A Kelly clamp was placed under the tape and the sleeves of the tape were removed. The tape was noted to be correctly positioned underneath the urethra without twists. The excess tape was removed at the level of the skin. Steri-Strips were applied over these small skin incisions. A typical Kelly plication was performed carefully covering the tension-free vaginal tape with thickened fascia. The bladder was plicated several sutures of 3-0 Vicryl.  A shelf of fascia was then approximated in the midline placing the bladder back  in its more anatomic position.The excess vaginal mucosa was trimmed. Vaginal mucosa was then closed in the midline with interrupted sutures to the level of the vaginal cuff. The vaginal cuff was closed with Vicryl suture. Hemostasis was noted. The posterior fourchette at approximately the hymenal ring was grasped using Allis clamps. The posterior vaginal mucosa was injected in the midline with a dilute Pitressin solution. A midline incision was made through the vaginal mucosa to the level of the vaginal cuff, and the vaginal mucosa was dissected laterally exposing the underlying attenuated fascia. Peritoneum over lying fatty tissue was identified and the enterocoele sac was tied of and approximated in a purse-string manner. Beginning at the vaginal cuff the attenuated fascia was grasped laterally and approximated in the midline thickening and tightening the fascia. These sutures were carried down to the level of the perineum. The excess vaginal mucosa was trimmed. The vagina was closed with interrupted sutures beginning at the vaginal cuff and carried down toward the perineal body. The perineal body was reinforced with multiple sutures of Vicryl. The mucosa was then closed over the perineal body in a subcuticular manner. Hemostasis was noted. Dr. Valentino Saxon provided exposure, dissection, suctioning, retraction, and general support and assistance during the procedure.   Elonda Husky, M.D. 04/08/2023 9:23 AM

## 2023-04-08 NOTE — Discharge Instructions (Signed)

## 2023-04-08 NOTE — Anesthesia Postprocedure Evaluation (Signed)
Anesthesia Post Note  Patient: CLARABEL RASKIN  Procedure(s) Performed: ANTERIOR (CYSTOCELE) AND POSTERIOR REPAIR (RECTOCELE) (Cervix) TOT (Bilateral: Cervix)  Patient location during evaluation: PACU Anesthesia Type: General Level of consciousness: awake and alert Pain management: pain level controlled Vital Signs Assessment: post-procedure vital signs reviewed and stable Respiratory status: spontaneous breathing, nonlabored ventilation, respiratory function stable and patient connected to nasal cannula oxygen Cardiovascular status: blood pressure returned to baseline and stable Postop Assessment: no apparent nausea or vomiting Anesthetic complications: no   No notable events documented.   Last Vitals:  Vitals:   04/08/23 1000 04/08/23 1015  BP: 111/68   Pulse: 69   Resp: 15   Temp:  (!) 36.1 C  SpO2: 96%     Last Pain:  Vitals:   04/08/23 1015  TempSrc:   PainSc: Asleep                 Corinda Gubler

## 2023-04-08 NOTE — Anesthesia Preprocedure Evaluation (Signed)
Anesthesia Evaluation  Patient identified by MRN, date of birth, ID band Patient awake    Reviewed: Allergy & Precautions, NPO status , Patient's Chart, lab work & pertinent test results  History of Anesthesia Complications Negative for: history of anesthetic complications  Airway Mallampati: II  TM Distance: >3 FB Neck ROM: Full    Dental no notable dental hx. (+) Teeth Intact   Pulmonary neg sleep apnea, neg COPD, Patient abstained from smoking.Not current smoker, former smoker   Pulmonary exam normal breath sounds clear to auscultation       Cardiovascular Exercise Tolerance: Good METShypertension, Pt. on medications (-) CAD and (-) Past MI (-) dysrhythmias  Rhythm:Regular Rate:Normal - Systolic murmurs    Neuro/Psych negative neurological ROS  negative psych ROS   GI/Hepatic ,GERD  Controlled,,(+)     (-) substance abuse    Endo/Other  neg diabetes    Renal/GU CRFRenal disease     Musculoskeletal  (+) Arthritis ,    Abdominal   Peds  Hematology   Anesthesia Other Findings Past Medical History: No date: Allergy No date: Anemia No date: Aortic atherosclerosis (HCC) No date: Cancer [facility])     Comment:  Uterine Cancer No date: CKD (chronic kidney disease), stage III (HCC) No date: DDD (degenerative disc disease), lumbar No date: GERD (gastroesophageal reflux disease)     Comment:  h/o No date: H. pylori infection No date: History of IBS No date: Hyperlipidemia No date: Hypertension No date: Positive ANA (antinuclear antibody) No date: Secondary hyperparathyroidism (HCC) No date: Thrombocytopenia (HCC)  Reproductive/Obstetrics                              Anesthesia Physical Anesthesia Plan  ASA: 2  Anesthesia Plan: General   Post-op Pain Management: Ofirmev IV (intra-op)*   Induction: Intravenous  PONV Risk Score and Plan: 4 or greater and Ondansetron, Dexamethasone  and Midazolam  Airway Management Planned: LMA  Additional Equipment: None  Intra-op Plan:   Post-operative Plan: Extubation in OR  Informed Consent: I have reviewed the patients History and Physical, chart, labs and discussed the procedure including the risks, benefits and alternatives for the proposed anesthesia with the patient or authorized representative who has indicated his/her understanding and acceptance.     Dental advisory given  Plan Discussed with: CRNA and Surgeon  Anesthesia Plan Comments: (Discussed risks of anesthesia with patient, including PONV, sore throat, lip/dental/eye damage. Rare risks discussed as well, such as cardiorespiratory and neurological sequelae, and allergic reactions. Discussed the role of CRNA in patient's perioperative care. Patient understands.)         Anesthesia Quick Evaluation

## 2023-04-09 ENCOUNTER — Encounter: Payer: Medicare PPO | Admitting: Obstetrics and Gynecology

## 2023-04-13 ENCOUNTER — Other Ambulatory Visit: Payer: Self-pay | Admitting: Family Medicine

## 2023-04-13 DIAGNOSIS — I10 Essential (primary) hypertension: Secondary | ICD-10-CM

## 2023-04-13 DIAGNOSIS — N1832 Chronic kidney disease, stage 3b: Secondary | ICD-10-CM

## 2023-04-15 ENCOUNTER — Ambulatory Visit (INDEPENDENT_AMBULATORY_CARE_PROVIDER_SITE_OTHER): Payer: Medicare PPO | Admitting: Obstetrics and Gynecology

## 2023-04-15 ENCOUNTER — Encounter: Payer: Self-pay | Admitting: Obstetrics and Gynecology

## 2023-04-15 ENCOUNTER — Other Ambulatory Visit: Payer: Self-pay

## 2023-04-15 VITALS — BP 131/84 | HR 70 | Ht 65.0 in | Wt 166.8 lb

## 2023-04-15 DIAGNOSIS — Z4889 Encounter for other specified surgical aftercare: Secondary | ICD-10-CM

## 2023-04-15 DIAGNOSIS — Z9889 Other specified postprocedural states: Secondary | ICD-10-CM

## 2023-04-15 NOTE — Progress Notes (Signed)
HPI:      [name] is a 77 y.o. G3P2 who LMP was No LMP recorded. Patient has had a hysterectomy.  Subjective:   She presents today 1 week from TOT with anterior repair.  She still has a catheter in place.  She has clamped it this morning and presented presumably with a full bladder.  She reports no other postop problems.  She is not having pain.  She is not having vaginal bleeding.    Hx: The following portions of the patient's history were reviewed and updated as appropriate:             She  has a past medical history of Allergy, Anemia, Aortic atherosclerosis (HCC), Cancer (HCC), CKD (chronic kidney disease), stage III (HCC), DDD (degenerative disc disease), lumbar, GERD (gastroesophageal reflux disease), H. pylori infection, History of IBS, Hyperlipidemia, Hypertension, Positive ANA (antinuclear antibody), Secondary hyperparathyroidism (HCC), and Thrombocytopenia (HCC). She does not have any pertinent problems on file. She  has a past surgical history that includes Abdominal hysterectomy; Colonoscopy with propofol (N/A, 10/07/2018); Lipoma excision (Left, 04/15/2020); Breast biopsy (Right, 04/09/2006); Breast biopsy (Left, 12/04/2017); Colonoscopy with propofol (N/A, 11/21/2021); Xi robotic laparoscopic assisted appendectomy (N/A, 11/28/2021); Anterior and posterior repair (N/A, 04/08/2023); and Bladder suspension (Bilateral, 04/08/2023). Her family history includes Congestive Heart Failure (age of onset: 76) in her mother; Dementia in her sister; Heart attack in her son; Heart attack (age of onset: 58) in her father; Liver cancer in her sister; Pancreatic cancer in her sister; Sinusitis in her sister; Stomach cancer in her sister; Uterine cancer (age of onset: 54) in her sister. She  reports that she quit smoking about 61 years ago. Her smoking use included cigarettes. She has a 2.00 pack-year smoking history. She has never used smokeless tobacco. She reports that she does not drink alcohol  and does not use drugs. She has a current medication list which includes the following prescription(s): acetaminophen, aspirin, biotin, carboxymethylcellulose, cholecalciferol, estradiol, ezetimibe, ferrous sulfate, fexofenadine, losartan-hydrochlorothiazide, rosuvastatin, and vitamin e. She has No Known Allergies.       Review of Systems:  Review of Systems  Constitutional: Denied constitutional symptoms, night sweats, recent illness, fatigue, fever, insomnia and weight loss.  Eyes: Denied eye symptoms, eye pain, photophobia, vision change and visual disturbance.  Ears/Nose/Throat/Neck: Denied ear, nose, throat or neck symptoms, hearing loss, nasal discharge, sinus congestion and sore throat.  Cardiovascular: Denied cardiovascular symptoms, arrhythmia, chest pain/pressure, edema, exercise intolerance, orthopnea and palpitations.  Respiratory: Denied pulmonary symptoms, asthma, pleuritic pain, productive sputum, cough, dyspnea and wheezing.  Gastrointestinal: Denied, gastro-esophageal reflux, melena, nausea and vomiting.  Genitourinary: Denied genitourinary symptoms including symptomatic vaginal discharge, pelvic relaxation issues, and urinary complaints.  Musculoskeletal: Denied musculoskeletal symptoms, stiffness, swelling, muscle weakness and myalgia.  Dermatologic: Denied dermatology symptoms, rash and scar.  Neurologic: Denied neurology symptoms, dizziness, headache, neck pain and syncope.  Psychiatric: Denied psychiatric symptoms, anxiety and depression.  Endocrine: Denied endocrine symptoms including hot flashes and night sweats.   Meds:   Current Outpatient Medications on File Prior to Visit  Medication Sig Dispense Refill   acetaminophen (TYLENOL) 500 MG tablet Take 2 tablets (1,000 mg total) by mouth every 6 (six) hours as needed for mild pain or headache. 30 tablet 0   aspirin 81 MG tablet Take 81 mg by mouth daily.     Biotin 5 MG CAPS Take 5 mg by mouth daily.      carboxymethylcellulose (REFRESH PLUS) 0.5 % SOLN Place 1  drop into both eyes daily.     cholecalciferol (VITAMIN D) 1000 UNITS tablet Take 1,000 Units by mouth daily.      estradiol (ESTRACE) 0.1 MG/GM vaginal cream Place 0.25 Applicatorfuls vaginally at bedtime. 90 g 3   ezetimibe (ZETIA) 10 MG tablet Take 1 tablet (10 mg total) by mouth daily. (Patient taking differently: Take 10 mg by mouth at bedtime.) 90 tablet 3   ferrous sulfate 324 MG TBEC Take 65 mg by mouth daily with breakfast.     fexofenadine (ALLEGRA) 180 MG tablet Take 180 mg by mouth every morning.     losartan-hydrochlorothiazide (HYZAAR) 100-12.5 MG tablet TAKE 1 TABLET BY MOUTH EVERY DAY 90 tablet 0   rosuvastatin (CRESTOR) 40 MG tablet Take 1 tablet (40 mg total) by mouth daily. (Patient taking differently: Take 40 mg by mouth at bedtime.) 90 tablet 1   vitamin E 1000 UNIT capsule Take 1,000 Units by mouth daily.     No current facility-administered medications on file prior to visit.      Objective:     Vitals:   04/15/23 1203  BP: 131/84  Pulse: 70   Filed Weights   04/15/23 1203  Weight: 166 lb 12.8 oz (75.7 kg)              Foley removed.  Patient voided 100.  Recatheterization 3 cc.          Assessment:    G3P2 Patient Active Problem List   Diagnosis Date Noted   Anemia in stage 3b chronic kidney disease (HCC) 08/02/2022   Thrombocytopenia (HCC) 08/02/2022   Thrombocytopathia (HCC) 04/20/2022   History of IBS 06/03/2020   Mass of arm, left    MGUS (monoclonal gammopathy of unknown significance) 09/01/2018   Secondary hyperparathyroidism (HCC) 09/01/2018   B12 deficiency 08/19/2018   Atherosclerosis of aorta (HCC) 07/25/2018   Proteinuria 05/09/2018   Positive ANA (antinuclear antibody) 04/02/2018   Bilateral dry eyes 04/02/2018   History of hysterectomy 05/23/2015   Stage 3b chronic kidney disease (HCC) 05/23/2015   Essential hypertension 05/22/2015   Chronic constipation 05/22/2015   DDD  (degenerative disc disease), lumbar 05/22/2015   DD (diverticular disease) 05/22/2015   Dyslipidemia 05/22/2015   History of cervical cancer 05/22/2015   Helicobacter pylori gastrointestinal tract infection 05/22/2015   Blood glucose elevated 05/22/2015   Overweight (BMI 25.0-29.9) 05/22/2015   Perennial allergic rhinitis with seasonal variation 05/22/2015   Vitamin D deficiency 05/22/2015   Impingement syndrome of shoulder 01/14/2015     1. Post-operative state     Patient doing well postop  Catheter removed and patient able to void.   Plan:            1.  Patient to void every 2 hours.  If she finds that she cannot void or is voiding very small amounts she is to contact us immediately.  Plan follow-up in 5 weeks Orders No orders of the defined types were placed in this encounter.   No orders of the defined types were placed in this encounter.     F/U  No follow-ups on file.  Elonda Husky, M.D. 04/15/2023 12:34 PM

## 2023-04-15 NOTE — Progress Notes (Signed)
Patient presents for 1 week postop. She states doing well since surgery. Reports feeling the urge to urinate and is very excited to get this catheter removed.

## 2023-04-16 ENCOUNTER — Encounter: Payer: Medicare PPO | Admitting: Obstetrics and Gynecology

## 2023-04-16 DIAGNOSIS — N8189 Other female genital prolapse: Secondary | ICD-10-CM

## 2023-04-16 DIAGNOSIS — Z9889 Other specified postprocedural states: Secondary | ICD-10-CM

## 2023-04-29 ENCOUNTER — Encounter: Payer: Self-pay | Admitting: Obstetrics and Gynecology

## 2023-05-02 ENCOUNTER — Inpatient Hospital Stay: Payer: Medicare PPO | Attending: Oncology

## 2023-05-02 DIAGNOSIS — E538 Deficiency of other specified B group vitamins: Secondary | ICD-10-CM

## 2023-05-02 MED ORDER — CYANOCOBALAMIN 1000 MCG/ML IJ SOLN
1000.0000 ug | Freq: Once | INTRAMUSCULAR | Status: AC
  Administered 2023-05-02: 1000 ug via INTRAMUSCULAR
  Filled 2023-05-02: qty 1

## 2023-05-15 ENCOUNTER — Ambulatory Visit (INDEPENDENT_AMBULATORY_CARE_PROVIDER_SITE_OTHER): Payer: Medicare PPO | Admitting: Obstetrics and Gynecology

## 2023-05-15 ENCOUNTER — Encounter: Payer: Self-pay | Admitting: Obstetrics and Gynecology

## 2023-05-15 VITALS — BP 130/85 | HR 56 | Ht 65.0 in | Wt 166.0 lb

## 2023-05-15 DIAGNOSIS — Z4889 Encounter for other specified surgical aftercare: Secondary | ICD-10-CM | POA: Diagnosis not present

## 2023-05-15 DIAGNOSIS — N39498 Other specified urinary incontinence: Secondary | ICD-10-CM | POA: Diagnosis not present

## 2023-05-15 DIAGNOSIS — R82998 Other abnormal findings in urine: Secondary | ICD-10-CM

## 2023-05-15 DIAGNOSIS — N898 Other specified noninflammatory disorders of vagina: Secondary | ICD-10-CM | POA: Diagnosis not present

## 2023-05-15 DIAGNOSIS — Z9889 Other specified postprocedural states: Secondary | ICD-10-CM | POA: Diagnosis not present

## 2023-05-15 DIAGNOSIS — D72829 Elevated white blood cell count, unspecified: Secondary | ICD-10-CM

## 2023-05-15 LAB — POCT URINALYSIS DIPSTICK
Bilirubin, UA: NEGATIVE
Glucose, UA: NEGATIVE
Ketones, UA: NEGATIVE
Nitrite, UA: NEGATIVE
Protein, UA: NEGATIVE
Spec Grav, UA: 1.01 (ref 1.010–1.025)
Urobilinogen, UA: 0.2 E.U./dL
pH, UA: 6 (ref 5.0–8.0)

## 2023-05-15 MED ORDER — NITROFURANTOIN MONOHYD MACRO 100 MG PO CAPS
100.0000 mg | ORAL_CAPSULE | Freq: Two times a day (BID) | ORAL | 0 refills | Status: DC
Start: 2023-05-15 — End: 2023-06-21

## 2023-05-15 NOTE — Progress Notes (Signed)
Patient presents for 5 week postop follow-up following A/P repair with TOT. She states no pain now but she is experiencing bleeding with BM's, urinary leakage like before and a pinkish odorous discharge.

## 2023-05-15 NOTE — Progress Notes (Signed)
HPI:      Morgan Livingston is a 77 y.o. G3P2 who LMP was No LMP recorded. Patient has had a hysterectomy.  Subjective:   She presents today 5 weeks from AMP repair with TOT.  Postoperatively she kept a catheter for 1 week for urinary retention.  Reports that she is now voiding without problem but she continues to have a small amount of urine leakage.  She also reports a creamy vaginal discharge.  She has a small amount of bright red bleeding on her toilet paper after bowel movements.  She reports she is having no problems with her bowel movements.  She reports no pain.    Hx: The following portions of the patient's history were reviewed and updated as appropriate:             She  has a past medical history of Allergy, Anemia, Aortic atherosclerosis (HCC), Cancer (HCC), CKD (chronic kidney disease), stage III (HCC), DDD (degenerative disc disease), lumbar, GERD (gastroesophageal reflux disease), H. pylori infection, History of IBS, Hyperlipidemia, Hypertension, Positive ANA (antinuclear antibody), Secondary hyperparathyroidism (HCC), and Thrombocytopenia (HCC). She does not have any pertinent problems on file. She  has a past surgical history that includes Abdominal hysterectomy; Colonoscopy with propofol (N/A, 10/07/2018); Lipoma excision (Left, 04/15/2020); Breast biopsy (Right, 04/09/2006); Breast biopsy (Left, 12/04/2017); Colonoscopy with propofol (N/A, 11/21/2021); Xi robotic laparoscopic assisted appendectomy (N/A, 11/28/2021); Anterior and posterior repair (N/A, 04/08/2023); and Bladder suspension (Bilateral, 04/08/2023). Her family history includes Congestive Heart Failure (age of onset: 50) in her mother; Dementia in her sister; Heart attack in her son; Heart attack (age of onset: 100) in her father; Liver cancer in her sister; Pancreatic cancer in her sister; Sinusitis in her sister; Stomach cancer in her sister; Uterine cancer (age of onset: 43) in her sister. She  reports that she quit  smoking about 61 years ago. Her smoking use included cigarettes. She has a 2.00 pack-year smoking history. She has never used smokeless tobacco. She reports that she does not drink alcohol and does not use drugs. She has a current medication list which includes the following prescription(s): acetaminophen, aspirin, biotin, carboxymethylcellulose, cholecalciferol, ezetimibe, ferrous sulfate, fexofenadine, losartan-hydrochlorothiazide, nitrofurantoin (macrocrystal-monohydrate), rosuvastatin, vitamin e, and estradiol. She has No Known Allergies.       Review of Systems:  Review of Systems  Constitutional: Denied constitutional symptoms, night sweats, recent illness, fatigue, fever, insomnia and weight loss.  Eyes: Denied eye symptoms, eye pain, photophobia, vision change and visual disturbance.  Ears/Nose/Throat/Neck: Denied ear, nose, throat or neck symptoms, hearing loss, nasal discharge, sinus congestion and sore throat.  Cardiovascular: Denied cardiovascular symptoms, arrhythmia, chest pain/pressure, edema, exercise intolerance, orthopnea and palpitations.  Respiratory: Denied pulmonary symptoms, asthma, pleuritic pain, productive sputum, cough, dyspnea and wheezing.  Gastrointestinal: Denied, gastro-esophageal reflux, melena, nausea and vomiting.  Genitourinary: See HPI for additional information.  Musculoskeletal: Denied musculoskeletal symptoms, stiffness, swelling, muscle weakness and myalgia.  Dermatologic: Denied dermatology symptoms, rash and scar.  Neurologic: Denied neurology symptoms, dizziness, headache, neck pain and syncope.  Psychiatric: Denied psychiatric symptoms, anxiety and depression.  Endocrine: Denied endocrine symptoms including hot flashes and night sweats.   Meds:   Current Outpatient Medications on File Prior to Visit  Medication Sig Dispense Refill   acetaminophen (TYLENOL) 500 MG tablet Take 2 tablets (1,000 mg total) by mouth every 6 (six) hours as needed for mild  pain or headache. 30 tablet 0   aspirin 81 MG tablet Take 81 mg by mouth daily.  Biotin 5 MG CAPS Take 5 mg by mouth daily.     carboxymethylcellulose (REFRESH PLUS) 0.5 % SOLN Place 1 drop into both eyes daily.     cholecalciferol (VITAMIN D) 1000 UNITS tablet Take 1,000 Units by mouth daily.      ezetimibe (ZETIA) 10 MG tablet Take 1 tablet (10 mg total) by mouth daily. (Patient taking differently: Take 10 mg by mouth at bedtime.) 90 tablet 3   ferrous sulfate 324 MG TBEC Take 65 mg by mouth daily with breakfast.     fexofenadine (ALLEGRA) 180 MG tablet Take 180 mg by mouth every morning.     losartan-hydrochlorothiazide (HYZAAR) 100-12.5 MG tablet TAKE 1 TABLET BY MOUTH EVERY DAY 90 tablet 0   rosuvastatin (CRESTOR) 40 MG tablet Take 1 tablet (40 mg total) by mouth daily. (Patient taking differently: Take 40 mg by mouth at bedtime.) 90 tablet 1   vitamin E 1000 UNIT capsule Take 1,000 Units by mouth daily.     estradiol (ESTRACE) 0.1 MG/GM vaginal cream Place 0.25 Applicatorfuls vaginally at bedtime. (Patient not taking: Reported on 05/15/2023) 90 g 3   No current facility-administered medications on file prior to visit.      Objective:     Vitals:   05/15/23 1007  BP: 130/85  Pulse: (!) 56   Filed Weights   05/15/23 1007  Weight: 166 lb (75.3 kg)              Physical examination   Pelvic:   Vulva: Normal appearance.  No lesions.  Vagina: No lesions or abnormalities noted.  Suture lines still noted-intact-healing well.  Support: Normal pelvic support.  Urethra No masses tenderness or scarring.  Meatus Normal size without lesions or prolapse.  Cervix: Surgically absent  Anus: Normal exam.  No lesions.  Perineum: Normal exam.  No lesions.   See UA          Assessment:    G3P2 Patient Active Problem List   Diagnosis Date Noted   Anemia in stage 3b chronic kidney disease (HCC) 08/02/2022   Thrombocytopenia (HCC) 08/02/2022   Thrombocytopathia (HCC) 04/20/2022    History of IBS 06/03/2020   Mass of arm, left    MGUS (monoclonal gammopathy of unknown significance) 09/01/2018   Secondary hyperparathyroidism (HCC) 09/01/2018   B12 deficiency 08/19/2018   Atherosclerosis of aorta (HCC) 07/25/2018   Proteinuria 05/09/2018   Positive ANA (antinuclear antibody) 04/02/2018   Bilateral dry eyes 04/02/2018   History of hysterectomy 05/23/2015   Stage 3b chronic kidney disease (HCC) 05/23/2015   Essential hypertension 05/22/2015   Chronic constipation 05/22/2015   DDD (degenerative disc disease), lumbar 05/22/2015   DD (diverticular disease) 05/22/2015   Dyslipidemia 05/22/2015   History of cervical cancer 05/22/2015   Helicobacter pylori gastrointestinal tract infection 05/22/2015   Blood glucose elevated 05/22/2015   Overweight (BMI 25.0-29.9) 05/22/2015   Perennial allergic rhinitis with seasonal variation 05/22/2015   Vitamin D deficiency 05/22/2015   Impingement syndrome of shoulder 01/14/2015     1. Post-operative state   2. Vaginal discharge   3. Other urinary incontinence   4. Leukocytes in urine     Patient with some symptoms and possible UTI based on UA.  Incisions healing well.  Vaginal discharge likely secondary to suture dissolving.   Plan:            1.  Will presumptively treat with Macrobid for UTI-urine for culture  2.  Reassured patient regarding suture lines and sutures dissolving  3.  Patient to resume use of estradiol vaginal cream every other night  4.  May resume most normal activities with exception of heavy lifting and nothing in the vagina.  Orders Orders Placed This Encounter  Procedures   Urine Culture   POCT urinalysis dipstick     Meds ordered this encounter  Medications   nitrofurantoin, macrocrystal-monohydrate, (MACROBID) 100 MG capsule    Sig: Take 1 capsule (100 mg total) by mouth 2 (two) times daily.    Dispense:  14 capsule    Refill:  0      F/U  Return in about 6 weeks (around  06/26/2023).  Elonda Husky, M.D. 05/15/2023 10:37 AM

## 2023-05-22 LAB — URINE CULTURE

## 2023-05-31 ENCOUNTER — Inpatient Hospital Stay: Payer: Medicare PPO | Attending: Oncology

## 2023-05-31 DIAGNOSIS — E538 Deficiency of other specified B group vitamins: Secondary | ICD-10-CM | POA: Diagnosis not present

## 2023-05-31 MED ORDER — CYANOCOBALAMIN 1000 MCG/ML IJ SOLN
1000.0000 ug | Freq: Once | INTRAMUSCULAR | Status: AC
  Administered 2023-05-31: 1000 ug via INTRAMUSCULAR
  Filled 2023-05-31: qty 1

## 2023-06-12 DIAGNOSIS — N1831 Chronic kidney disease, stage 3a: Secondary | ICD-10-CM | POA: Diagnosis not present

## 2023-06-17 DIAGNOSIS — N2581 Secondary hyperparathyroidism of renal origin: Secondary | ICD-10-CM | POA: Diagnosis not present

## 2023-06-17 DIAGNOSIS — R809 Proteinuria, unspecified: Secondary | ICD-10-CM | POA: Diagnosis not present

## 2023-06-17 DIAGNOSIS — I10 Essential (primary) hypertension: Secondary | ICD-10-CM | POA: Diagnosis not present

## 2023-06-17 DIAGNOSIS — N1832 Chronic kidney disease, stage 3b: Secondary | ICD-10-CM | POA: Diagnosis not present

## 2023-06-20 NOTE — Progress Notes (Signed)
Name: Morgan Livingston   MRN: [id number]    DOB: Feb 04, 1946   Date:06/21/2023       Progress Note  Subjective  Chief Complaint  Follow Up  HPI  HTN: she is on Losartan hctz  100 /12.5  She denies  chest pain , palpitation, SOB or orthostatic changes . BP is towards low end of normal but denies dizziness    B12 deficiency /Thrombocytopenia/MUGS  : under the care of Dr. Cathie Hoops, Under the care of hematologist,  stable , she gets monthly B12 injections . Unchanged    Atherosclerosis aorta/dyslipidemia: found on X-ray of lumbar spine, she does not have claudication. She will continue statin , zetia and aspirin daily, last LDL improved but still above 100. May have familial hyperlipidemia - father had a heart attack at age 16 , discussed injectable options to get LDL below 100 but she would like to hold off for now    Proteinuria and CKI stage III a : seeing Dr. Cherylann Ratel,  She denies cramping, pruritis or decrease in urine output  .Reviewed labs done this month and GFR slightly lower at 38 , urine micro 45 and stable  , she still has secondary hyperparathyroidism he is on ARB , BP is towards low end of normal but no orthostatic changes   Positive for Sjogren's Anti SS-A and also ANA: on labs done 03/2018 by Dr. Cherylann Ratel , 03/2018 showed negative for M Spike UPEP spike and monitored by  Dr. Cathie Hoops. She denies dry mouth , but states she has a long history of dry eyes and not changed - uses eye drops. She is using premarin and vaginal dryness   Cystocele and rectocele: s/p repair, she is doing well, no longer feeling the vaginal bulging, she has noticed some increase in constipation but has bowel movements daily , discussed miralax    Hyperglycemia: no family history of diabetes, A1C was as high at 6.4 % but last visit it was 6 %  she still bakes and likes sweets , we will recheck next visit   Patient Active Problem List   Diagnosis Date Noted   Anemia in stage 3b chronic kidney disease (HCC) 08/02/2022    Thrombocytopenia (HCC) 08/02/2022   Thrombocytopathia (HCC) 04/20/2022   History of IBS 06/03/2020   Mass of arm, left    MGUS (monoclonal gammopathy of unknown significance) 09/01/2018   Secondary hyperparathyroidism (HCC) 09/01/2018   B12 deficiency 08/19/2018   Atherosclerosis of aorta (HCC) 07/25/2018   Proteinuria 05/09/2018   Positive ANA (antinuclear antibody) 04/02/2018   Bilateral dry eyes 04/02/2018   History of hysterectomy 05/23/2015   Stage 3b chronic kidney disease (HCC) 05/23/2015   Essential hypertension 05/22/2015   Chronic constipation 05/22/2015   DDD (degenerative disc disease), lumbar 05/22/2015   DD (diverticular disease) 05/22/2015   Dyslipidemia 05/22/2015   History of cervical cancer 05/22/2015   Helicobacter pylori gastrointestinal tract infection 05/22/2015   Blood glucose elevated 05/22/2015   Overweight (BMI 25.0-29.9) 05/22/2015   Perennial allergic rhinitis with seasonal variation 05/22/2015   Vitamin D deficiency 05/22/2015   Impingement syndrome of shoulder 01/14/2015    Past Surgical History:  Procedure Laterality Date   ABDOMINAL HYSTERECTOMY     ANTERIOR AND POSTERIOR REPAIR N/A 04/08/2023   Procedure: ANTERIOR (CYSTOCELE) AND POSTERIOR REPAIR (RECTOCELE);  Surgeon: Linzie Collin, MD;  Location: [facility] ORS;  Service: Gynecology;  Laterality: N/A;   BLADDER SUSPENSION Bilateral 04/08/2023   Procedure: TOT;  Surgeon: Linzie Collin, MD;  Location: [facility];  Service: Gynecology;  Laterality: Bilateral;   BREAST BIOPSY Right 04/09/2006   negative   BREAST BIOPSY Left 12/04/2017   fibroadenmatoid change with coarse calcs   COLONOSCOPY WITH PROPOFOL N/A 10/07/2018   Procedure: COLONOSCOPY WITH PROPOFOL;  Surgeon: Wyline Mood, MD;  Location[facility] ENDOSCOPY;  Service: Gastroenterology;  Laterality: N/A;   COLONOSCOPY WITH PROPOFOL N/A 11/21/2021   Procedure: COLONOSCOPY WITH PROPOFOL;  Surgeon: Wyline Mood, MD;  Location: [facility];   Service: Gastroenterology;  Laterality: N/A;   LIPOMA EXCISION Left 04/15/2020   Procedure: EXCISION LIPOMA, left arm;  Surgeon: Duanne Guess, MD;  Location: [facility] ORS;  Service: General;  Laterality: Left;   XI ROBOTIC LAPAROSCOPIC ASSISTED APPENDECTOMY N/A 11/28/2021   Procedure: XI ROBOTIC LAPAROSCOPIC ASSISTED APPENDECTOMY;  Surgeon: Carolan Shiver, MD;  Location: [facility] ORS;  Service: General;  Laterality: N/A;    Family History  Problem Relation Age of Onset   Congestive Heart Failure Mother 21   Heart attack Father 31   Sinusitis Sister    Stomach cancer Sister    Uterine cancer Sister 63   Liver cancer Sister    Pancreatic cancer Sister    Dementia Sister        42   Heart attack Son    Breast cancer Neg Hx     Social History   Tobacco Use   Smoking status: Former    Current packs/day: 0.00    Average packs/day: 1 pack/day for 2.0 years (2.0 ttl pk-yrs)    Types: Cigarettes    Start date: 14    Quit date: 1963    Years since quitting: 61.6   Smokeless tobacco: Never   Tobacco comments:    smoking cessation materials not required  Substance Use Topics   Alcohol use: No    Alcohol/week: 0.0 standard drinks of alcohol     Current Outpatient Medications:    acetaminophen (TYLENOL) 500 MG tablet, Take 2 tablets (1,000 mg total) by mouth every 6 (six) hours as needed for mild pain or headache., Disp: 30 tablet, Rfl: 0   aspirin 81 MG tablet, Take 81 mg by mouth daily., Disp: , Rfl:    Biotin 5 MG CAPS, Take 5 mg by mouth daily., Disp: , Rfl:    carboxymethylcellulose (REFRESH PLUS) 0.5 % SOLN, Place 1 drop into both eyes daily., Disp: , Rfl:    cholecalciferol (VITAMIN D) 1000 UNITS tablet, Take 1,000 Units by mouth daily. , Disp: , Rfl:    estradiol (ESTRACE) 0.1 MG/GM vaginal cream, Place 0.25 Applicatorfuls vaginally at bedtime., Disp: 90 g, Rfl: 3   ezetimibe (ZETIA) 10 MG tablet, Take 1 tablet (10 mg total) by mouth daily. (Patient taking differently:  Take 10 mg by mouth at bedtime.), Disp: 90 tablet, Rfl: 3   ferrous sulfate 324 MG TBEC, Take 65 mg by mouth daily with breakfast., Disp: , Rfl:    fexofenadine (ALLEGRA) 180 MG tablet, Take 180 mg by mouth every morning., Disp: , Rfl:    losartan-hydrochlorothiazide (HYZAAR) 100-12.5 MG tablet, TAKE 1 TABLET BY MOUTH EVERY DAY, Disp: 90 tablet, Rfl: 0   nitrofurantoin, macrocrystal-monohydrate, (MACROBID) 100 MG capsule, Take 1 capsule (100 mg total) by mouth 2 (two) times daily., Disp: 14 capsule, Rfl: 0   rosuvastatin (CRESTOR) 40 MG tablet, Take 1 tablet (40 mg total) by mouth daily. (Patient taking differently: Take 40 mg by mouth at bedtime.), Disp: 90 tablet, Rfl: 1   vitamin E 1000 UNIT capsule, Take 1,000 Units  by mouth daily., Disp: , Rfl:   No Known Allergies  I personally reviewed active problem list, medication list, allergies, family history, social history, health maintenance with the patient/caregiver today.   ROS  Constitutional: Negative for fever or weight change.  Respiratory: Negative for cough and shortness of breath.   Cardiovascular: Negative for chest pain or palpitations.  Gastrointestinal: Negative for abdominal pain, no bowel changes.  Musculoskeletal: Negative for gait problem or joint swelling.  Skin: Negative for rash.  Neurological: Negative for dizziness or headache.  No other specific complaints in a complete review of systems (except as listed in HPI above).   Objective  Vitals:   06/21/23 0948  BP: 118/70  Pulse: 62  Resp: 16  SpO2: 97%  Weight: 166 lb (75.3 kg)  Height: 5\' 5"  (1.651 m)    Body mass index is 27.62 kg/m.  Physical Exam  Constitutional: Patient appears well-developed and well-nourished. Obese  No distress.  HEENT: head atraumatic, normocephalic, pupils equal and reactive to light, neck supple Cardiovascular: Normal rate, regular rhythm and normal heart sounds.  No murmur heard. No BLE edema. Pulmonary/Chest: Effort normal  and breath sounds normal. No respiratory distress. Abdominal: Soft.  There is no tenderness. Psychiatric: Patient has a normal mood and affect. behavior is normal. Judgment and thought content normal.    PHQ2/9:    06/21/2023    9:49 AM 02/18/2023    9:32 AM 01/10/2023   10:36 AM 01/03/2023    9:12 AM 11/28/2022    2:29 PM  Depression screen PHQ 2/9  Decreased Interest 0 0 0 0 0  Down, Depressed, Hopeless 0 0 0 0 0  PHQ - 2 Score 0 0 0 0 0  Altered sleeping 0 0 0 0 0  Tired, decreased energy 0 0 0 0 0  Change in appetite 0 0 0 0 0  Feeling bad or failure about yourself  0 0 0 0 0  Trouble concentrating 0 0 0 0 0  Moving slowly or fidgety/restless 0 0 0 0 0  Suicidal thoughts 0 0 0 0 0  PHQ-9 Score 0 0 0 0 0    phq 9 is negative   Fall Risk:    06/21/2023    9:49 AM 02/18/2023    9:39 AM 01/10/2023   10:36 AM 01/03/2023    9:09 AM 11/28/2022    2:29 PM  Fall Risk   Falls in the past year? 0 0 0 0 0  Number falls in past yr: 0 0 0 0   Injury with Fall? 0 0 0 0   Risk for fall due to : No Fall Risks No Fall Risks No Fall Risks No Fall Risks No Fall Risks  Follow up Falls prevention discussed Falls prevention discussed Falls prevention discussed Education provided;Falls prevention discussed Falls prevention discussed      Functional Status Survey: Is the patient deaf or have difficulty hearing?: No Does the patient have difficulty seeing, even when wearing glasses/contacts?: No Does the patient have difficulty concentrating, remembering, or making decisions?: No Does the patient have difficulty walking or climbing stairs?: No Does the patient have difficulty dressing or bathing?: No Does the patient have difficulty doing errands alone such as visiting a doctor's office or shopping?: No    Assessment & Plan  1. Stage 3b chronic kidney disease (HCC)  - losartan-hydrochlorothiazide (HYZAAR) 100-12.5 MG tablet; Take 1 tablet by mouth daily.  Dispense: 90 tablet; Refill:  1  2. Atherosclerosis of aorta (  HCC)  - rosuvastatin (CRESTOR) 40 MG tablet; Take 1 tablet (40 mg total) by mouth daily.  Dispense: 90 tablet; Refill: 1  3. Thrombocytopenia (HCC)  Stable   4. Anemia in stage 3b chronic kidney disease (HCC)  stable  5. Dyslipidemia  - rosuvastatin (CRESTOR) 40 MG tablet; Take 1 tablet (40 mg total) by mouth daily.  Dispense: 90 tablet; Refill: 1  6. Essential hypertension  - losartan-hydrochlorothiazide (HYZAAR) 100-12.5 MG tablet; Take 1 tablet by mouth daily.  Dispense: 90 tablet; Refill: 1  7. Vitamin D deficiency  Continue supplementation   8. MGUS (monoclonal gammopathy of unknown significance)  Continue follow up with hematologist   9. B12 deficiency  She gets injections at hematologist

## 2023-06-21 ENCOUNTER — Ambulatory Visit: Payer: Medicare PPO | Admitting: Family Medicine

## 2023-06-21 ENCOUNTER — Encounter: Payer: Self-pay | Admitting: Family Medicine

## 2023-06-21 VITALS — BP 118/70 | HR 62 | Resp 16 | Ht 65.0 in | Wt 166.0 lb

## 2023-06-21 DIAGNOSIS — E559 Vitamin D deficiency, unspecified: Secondary | ICD-10-CM

## 2023-06-21 DIAGNOSIS — D472 Monoclonal gammopathy: Secondary | ICD-10-CM

## 2023-06-21 DIAGNOSIS — D696 Thrombocytopenia, unspecified: Secondary | ICD-10-CM

## 2023-06-21 DIAGNOSIS — E785 Hyperlipidemia, unspecified: Secondary | ICD-10-CM | POA: Diagnosis not present

## 2023-06-21 DIAGNOSIS — D631 Anemia in chronic kidney disease: Secondary | ICD-10-CM

## 2023-06-21 DIAGNOSIS — E538 Deficiency of other specified B group vitamins: Secondary | ICD-10-CM

## 2023-06-21 DIAGNOSIS — I700 Atherosclerosis of aorta: Secondary | ICD-10-CM | POA: Diagnosis not present

## 2023-06-21 DIAGNOSIS — N1832 Chronic kidney disease, stage 3b: Secondary | ICD-10-CM

## 2023-06-21 DIAGNOSIS — I10 Essential (primary) hypertension: Secondary | ICD-10-CM

## 2023-06-21 MED ORDER — LOSARTAN POTASSIUM-HCTZ 100-12.5 MG PO TABS
1.0000 | ORAL_TABLET | Freq: Every day | ORAL | 1 refills | Status: DC
Start: 2023-06-21 — End: 2024-01-06

## 2023-06-21 MED ORDER — ROSUVASTATIN CALCIUM 40 MG PO TABS
40.0000 mg | ORAL_TABLET | Freq: Every day | ORAL | 1 refills | Status: DC
Start: 2023-06-21 — End: 2024-01-06

## 2023-07-01 ENCOUNTER — Encounter: Payer: Self-pay | Admitting: Oncology

## 2023-07-02 ENCOUNTER — Inpatient Hospital Stay: Payer: Medicare PPO | Attending: Oncology

## 2023-07-02 DIAGNOSIS — E538 Deficiency of other specified B group vitamins: Secondary | ICD-10-CM | POA: Diagnosis not present

## 2023-07-02 MED ORDER — CYANOCOBALAMIN 1000 MCG/ML IJ SOLN
1000.0000 ug | Freq: Once | INTRAMUSCULAR | Status: AC
  Administered 2023-07-02: 1000 ug via INTRAMUSCULAR
  Filled 2023-07-02: qty 1

## 2023-07-09 ENCOUNTER — Ambulatory Visit (INDEPENDENT_AMBULATORY_CARE_PROVIDER_SITE_OTHER): Payer: Medicare PPO | Admitting: Obstetrics and Gynecology

## 2023-07-09 ENCOUNTER — Encounter: Payer: Self-pay | Admitting: Obstetrics and Gynecology

## 2023-07-09 VITALS — BP 118/76 | HR 59 | Ht 65.0 in | Wt 166.2 lb

## 2023-07-09 DIAGNOSIS — Z4889 Encounter for other specified surgical aftercare: Secondary | ICD-10-CM | POA: Diagnosis not present

## 2023-07-09 DIAGNOSIS — Z9889 Other specified postprocedural states: Secondary | ICD-10-CM

## 2023-07-09 NOTE — Progress Notes (Signed)
Patient presents for 3 month postop follow-up following A/P repair with TOT. She states doing very well at this time. Declines pain, bleeding or any urinary trouble.

## 2023-07-09 NOTE — Progress Notes (Signed)
HPI:      [name] is a 77 y.o. G3P2 who LMP was No LMP recorded. Patient has had a hysterectomy.  Subjective:   She presents today 3 months from anterior and posterior repair with TOT.  She reports she is doing well.  She reports no further issues.  Has resumed normal activities with exception of intercourse.  She still reports that she is not doing any heavy lifting.    Hx: The following portions of the patient's history were reviewed and updated as appropriate:             She  has a past medical history of Allergy, Anemia, Aortic atherosclerosis (HCC), Cancer (HCC), CKD (chronic kidney disease), stage III (HCC), DDD (degenerative disc disease), lumbar, GERD (gastroesophageal reflux disease), H. pylori infection, History of IBS, Hyperlipidemia, Hypertension, Positive ANA (antinuclear antibody), Secondary hyperparathyroidism (HCC), and Thrombocytopenia (HCC). She does not have any pertinent problems on file. She  has a past surgical history that includes Abdominal hysterectomy; Colonoscopy with propofol (N/A, 10/07/2018); Lipoma excision (Left, 04/15/2020); Breast biopsy (Right, 04/09/2006); Breast biopsy (Left, 12/04/2017); Colonoscopy with propofol (N/A, 11/21/2021); Xi robotic laparoscopic assisted appendectomy (N/A, 11/28/2021); Anterior and posterior repair (N/A, 04/08/2023); and Bladder suspension (Bilateral, 04/08/2023). Her family history includes Congestive Heart Failure (age of onset: 66) in her mother; Dementia in her sister; Heart attack in her son; Heart attack (age of onset: 68) in her father; Liver cancer in her sister; Pancreatic cancer in her sister; Sinusitis in her sister; Stomach cancer in her sister; Uterine cancer (age of onset: 11) in her sister. She  reports that she quit smoking about 61 years ago. Her smoking use included cigarettes. She started smoking about 63 years ago. She has a 2 pack-year smoking history. She has never used smokeless tobacco. She reports that she  does not drink alcohol and does not use drugs. She has a current medication list which includes the following prescription(s): acetaminophen, aspirin, biotin, carboxymethylcellulose, cholecalciferol, estradiol, ezetimibe, ferrous sulfate, fexofenadine, losartan-hydrochlorothiazide, rosuvastatin, and vitamin e. She has No Known Allergies.       Review of Systems:  Review of Systems  Constitutional: Denied constitutional symptoms, night sweats, recent illness, fatigue, fever, insomnia and weight loss.  Eyes: Denied eye symptoms, eye pain, photophobia, vision change and visual disturbance.  Ears/Nose/Throat/Neck: Denied ear, nose, throat or neck symptoms, hearing loss, nasal discharge, sinus congestion and sore throat.  Cardiovascular: Denied cardiovascular symptoms, arrhythmia, chest pain/pressure, edema, exercise intolerance, orthopnea and palpitations.  Respiratory: Denied pulmonary symptoms, asthma, pleuritic pain, productive sputum, cough, dyspnea and wheezing.  Gastrointestinal: Denied, gastro-esophageal reflux, melena, nausea and vomiting.  Genitourinary: Denied genitourinary symptoms including symptomatic vaginal discharge, pelvic relaxation issues, and urinary complaints.  Musculoskeletal: Denied musculoskeletal symptoms, stiffness, swelling, muscle weakness and myalgia.  Dermatologic: Denied dermatology symptoms, rash and scar.  Neurologic: Denied neurology symptoms, dizziness, headache, neck pain and syncope.  Psychiatric: Denied psychiatric symptoms, anxiety and depression.  Endocrine: Denied endocrine symptoms including hot flashes and night sweats.   Meds:   Current Outpatient Medications on File Prior to Visit  Medication Sig Dispense Refill   acetaminophen (TYLENOL) 500 MG tablet Take 2 tablets (1,000 mg total) by mouth every 6 (six) hours as needed for mild pain or headache. 30 tablet 0   aspirin 81 MG tablet Take 81 mg by mouth daily.     Biotin 5 MG CAPS Take 5 mg by mouth  daily.     carboxymethylcellulose (REFRESH PLUS) 0.5 % SOLN Place  1 drop into both eyes daily.     cholecalciferol (VITAMIN D) 1000 UNITS tablet Take 1,000 Units by mouth daily.      estradiol (ESTRACE) 0.1 MG/GM vaginal cream Place 0.25 Applicatorfuls vaginally at bedtime. 90 g 3   ezetimibe (ZETIA) 10 MG tablet Take 1 tablet (10 mg total) by mouth daily. (Patient taking differently: Take 10 mg by mouth at bedtime.) 90 tablet 3   ferrous sulfate 324 MG TBEC Take 65 mg by mouth daily with breakfast.     fexofenadine (ALLEGRA) 180 MG tablet Take 180 mg by mouth every morning.     losartan-hydrochlorothiazide (HYZAAR) 100-12.5 MG tablet Take 1 tablet by mouth daily. 90 tablet 1   rosuvastatin (CRESTOR) 40 MG tablet Take 1 tablet (40 mg total) by mouth daily. 90 tablet 1   vitamin E 1000 UNIT capsule Take 1,000 Units by mouth daily.     No current facility-administered medications on file prior to visit.      Objective:     Vitals:   07/09/23 1009  BP: 118/76  Pulse: (!) 59   Filed Weights   07/09/23 1009  Weight: 166 lb 3.2 oz (75.4 kg)                        Assessment:    G3P2 Patient Active Problem List   Diagnosis Date Noted   Anemia in stage 3b chronic kidney disease (HCC) 08/02/2022   Thrombocytopenia (HCC) 08/02/2022   History of IBS 06/03/2020   Mass of arm, left    MGUS (monoclonal gammopathy of unknown significance) 09/01/2018   Secondary hyperparathyroidism (HCC) 09/01/2018   B12 deficiency 08/19/2018   Atherosclerosis of aorta (HCC) 07/25/2018   Proteinuria 05/09/2018   Positive ANA (antinuclear antibody) 04/02/2018   Bilateral dry eyes 04/02/2018   History of hysterectomy 05/23/2015   Stage 3b chronic kidney disease (HCC) 05/23/2015   Essential hypertension 05/22/2015   Chronic constipation 05/22/2015   DDD (degenerative disc disease), lumbar 05/22/2015   DD (diverticular disease) 05/22/2015   Dyslipidemia 05/22/2015   History of cervical cancer  05/22/2015   Helicobacter pylori gastrointestinal tract infection 05/22/2015   Blood glucose elevated 05/22/2015   Overweight (BMI 25.0-29.9) 05/22/2015   Perennial allergic rhinitis with seasonal variation 05/22/2015   Vitamin D deficiency 05/22/2015   Impingement syndrome of shoulder 01/14/2015     1. Post-operative state     Patient with excellent recovery from A&P repair/TOT   Plan:            1.  Patient may begin to slowly lift heavier objects but still to avoid very heavy lifting.  Otherwise normal activities. Orders No orders of the defined types were placed in this encounter.   No orders of the defined types were placed in this encounter.     F/U  Return for Annual Physical. I spent 18 minutes involved in the care of this patient preparing to see the patient by obtaining and reviewing her medical history (including labs, imaging tests and prior procedures), documenting clinical information in the electronic health record (EHR), counseling and coordinating care plans, writing and sending prescriptions, ordering tests or procedures and in direct communicating with the patient and medical staff discussing pertinent items from her history and physical exam.  Elonda Husky, M.D. 07/09/2023 10:46 AM

## 2023-07-18 DIAGNOSIS — E669 Obesity, unspecified: Secondary | ICD-10-CM | POA: Diagnosis not present

## 2023-07-18 DIAGNOSIS — E782 Mixed hyperlipidemia: Secondary | ICD-10-CM | POA: Diagnosis not present

## 2023-07-18 DIAGNOSIS — I10 Essential (primary) hypertension: Secondary | ICD-10-CM | POA: Diagnosis not present

## 2023-07-18 DIAGNOSIS — J329 Chronic sinusitis, unspecified: Secondary | ICD-10-CM | POA: Diagnosis not present

## 2023-07-18 DIAGNOSIS — R002 Palpitations: Secondary | ICD-10-CM | POA: Diagnosis not present

## 2023-07-18 DIAGNOSIS — R42 Dizziness and giddiness: Secondary | ICD-10-CM | POA: Diagnosis not present

## 2023-07-18 DIAGNOSIS — N1831 Chronic kidney disease, stage 3a: Secondary | ICD-10-CM | POA: Diagnosis not present

## 2023-07-18 DIAGNOSIS — R9431 Abnormal electrocardiogram [ECG] [EKG]: Secondary | ICD-10-CM | POA: Diagnosis not present

## 2023-07-18 DIAGNOSIS — N183 Chronic kidney disease, stage 3 unspecified: Secondary | ICD-10-CM | POA: Diagnosis not present

## 2023-07-30 ENCOUNTER — Inpatient Hospital Stay: Payer: Medicare PPO | Attending: Oncology

## 2023-07-30 DIAGNOSIS — Z87891 Personal history of nicotine dependence: Secondary | ICD-10-CM | POA: Insufficient documentation

## 2023-07-30 DIAGNOSIS — E538 Deficiency of other specified B group vitamins: Secondary | ICD-10-CM | POA: Diagnosis not present

## 2023-07-30 DIAGNOSIS — R5383 Other fatigue: Secondary | ICD-10-CM | POA: Insufficient documentation

## 2023-07-30 DIAGNOSIS — D631 Anemia in chronic kidney disease: Secondary | ICD-10-CM | POA: Insufficient documentation

## 2023-07-30 DIAGNOSIS — N1832 Chronic kidney disease, stage 3b: Secondary | ICD-10-CM | POA: Diagnosis not present

## 2023-07-30 DIAGNOSIS — D696 Thrombocytopenia, unspecified: Secondary | ICD-10-CM

## 2023-07-30 LAB — CBC WITH DIFFERENTIAL/PLATELET
Abs Immature Granulocytes: 0.01 10*3/uL (ref 0.00–0.07)
Basophils Absolute: 0 10*3/uL (ref 0.0–0.1)
Basophils Relative: 1 %
Eosinophils Absolute: 0.1 10*3/uL (ref 0.0–0.5)
Eosinophils Relative: 2 %
HCT: 35.4 % — ABNORMAL LOW (ref 36.0–46.0)
Hemoglobin: 11.8 g/dL — ABNORMAL LOW (ref 12.0–15.0)
Immature Granulocytes: 0 %
Lymphocytes Relative: 43 %
Lymphs Abs: 2.6 10*3/uL (ref 0.7–4.0)
MCH: 31.4 pg (ref 26.0–34.0)
MCHC: 33.3 g/dL (ref 30.0–36.0)
MCV: 94.1 fL (ref 80.0–100.0)
Monocytes Absolute: 0.7 10*3/uL (ref 0.1–1.0)
Monocytes Relative: 12 %
Neutro Abs: 2.7 10*3/uL (ref 1.7–7.7)
Neutrophils Relative %: 42 %
Platelets: 132 10*3/uL — ABNORMAL LOW (ref 150–400)
RBC: 3.76 MIL/uL — ABNORMAL LOW (ref 3.87–5.11)
RDW: 13.2 % (ref 11.5–15.5)
WBC: 6.1 10*3/uL (ref 4.0–10.5)
nRBC: 0 % (ref 0.0–0.2)

## 2023-07-30 LAB — IRON AND TIBC
Iron: 68 ug/dL (ref 28–170)
Saturation Ratios: 21 % (ref 10.4–31.8)
TIBC: 330 ug/dL (ref 250–450)
UIBC: 262 ug/dL

## 2023-07-30 LAB — COMPREHENSIVE METABOLIC PANEL
ALT: 26 U/L (ref 0–44)
AST: 31 U/L (ref 15–41)
Albumin: 3.8 g/dL (ref 3.5–5.0)
Alkaline Phosphatase: 45 U/L (ref 38–126)
Anion gap: 5 (ref 5–15)
BUN: 19 mg/dL (ref 8–23)
CO2: 27 mmol/L (ref 22–32)
Calcium: 9.3 mg/dL (ref 8.9–10.3)
Chloride: 106 mmol/L (ref 98–111)
Creatinine, Ser: 1.41 mg/dL — ABNORMAL HIGH (ref 0.44–1.00)
GFR, Estimated: 39 mL/min — ABNORMAL LOW (ref >60)
Glucose, Bld: 103 mg/dL — ABNORMAL HIGH (ref 70–99)
Potassium: 4.5 mmol/L (ref 3.5–5.1)
Sodium: 138 mmol/L (ref 135–145)
Total Bilirubin: 0.7 mg/dL (ref 0.3–1.2)
Total Protein: 7.5 g/dL (ref 6.5–8.1)

## 2023-07-30 LAB — FERRITIN: Ferritin: 26 ng/mL (ref 11–307)

## 2023-07-30 LAB — VITAMIN B12: Vitamin B-12: 423 pg/mL (ref 180–914)

## 2023-08-02 ENCOUNTER — Other Ambulatory Visit: Payer: Medicare PPO

## 2023-08-02 ENCOUNTER — Inpatient Hospital Stay (HOSPITAL_BASED_OUTPATIENT_CLINIC_OR_DEPARTMENT_OTHER): Payer: Medicare PPO | Admitting: Oncology

## 2023-08-02 ENCOUNTER — Encounter: Payer: Self-pay | Admitting: Oncology

## 2023-08-02 ENCOUNTER — Inpatient Hospital Stay: Payer: Medicare PPO

## 2023-08-02 VITALS — BP 123/72 | HR 61 | Temp 97.0°F | Resp 18 | Wt 166.1 lb

## 2023-08-02 DIAGNOSIS — D631 Anemia in chronic kidney disease: Secondary | ICD-10-CM

## 2023-08-02 DIAGNOSIS — D691 Qualitative platelet defects: Secondary | ICD-10-CM | POA: Diagnosis not present

## 2023-08-02 DIAGNOSIS — R5383 Other fatigue: Secondary | ICD-10-CM | POA: Diagnosis not present

## 2023-08-02 DIAGNOSIS — E538 Deficiency of other specified B group vitamins: Secondary | ICD-10-CM

## 2023-08-02 DIAGNOSIS — N1832 Chronic kidney disease, stage 3b: Secondary | ICD-10-CM | POA: Diagnosis not present

## 2023-08-02 DIAGNOSIS — Z87891 Personal history of nicotine dependence: Secondary | ICD-10-CM | POA: Diagnosis not present

## 2023-08-02 MED ORDER — CYANOCOBALAMIN 1000 MCG/ML IJ SOLN
1000.0000 ug | Freq: Once | INTRAMUSCULAR | Status: AC
  Administered 2023-08-02: 1000 ug via INTRAMUSCULAR
  Filled 2023-08-02: qty 1

## 2023-08-02 MED ORDER — IRON-VITAMIN C 65-125 MG PO TABS: 1.0000 | ORAL_TABLET | Freq: Every day | ORAL | 2 refills | Status: AC

## 2023-08-02 NOTE — Assessment & Plan Note (Addendum)
Labs are reviewed and discussed with patient. Lab Results  Component Value Date   HGB 11.8 (L) 07/30/2023   TIBC 330 07/30/2023   IRONPCTSAT 21 07/30/2023   FERRITIN 26 07/30/2023    Patient is not interested in IV Venofer treatment. Continue Vitron C 1 tablet

## 2023-08-02 NOTE — Progress Notes (Signed)
Hematology/Oncology Progress note [phone] [phone]      Patient Care Team: Alba Cory, MD as PCP - General (Family Medicine) Rickard Patience, MD as Consulting Physician (Oncology) Mady Haagensen, MD (Nephrology) Alwyn Pea, MD as Consulting Physician (Cardiology) Wyline Mood, MD as Consulting Physician (Gastroenterology)  ASSESSMENT & PLAN:   B12 deficiency Antiparietal and intrinsic factor antibodies are both negative. B12 level is within normal limit.  Continue B12 IM injection , every 3 months.   Anemia in stage 3b chronic kidney disease (HCC) Labs are reviewed and discussed with patient. Lab Results  Component Value Date   HGB 11.8 (L) 07/30/2023   TIBC 330 07/30/2023   IRONPCTSAT 21 07/30/2023   FERRITIN 26 07/30/2023    Patient is not interested in IV Venofer treatment. Continue Vitron C 1 tablet  Thrombocytopathia (HCC) platelet counts are stable.  Patient has had previous work-up done including negative hepatitis, HIV.  Monitor counts.    Orders Placed This Encounter  Procedures   CBC with Differential (Cancer Center Only)    Standing Status:   Future    Standing Expiration Date:   08/01/2024   Iron and TIBC    Standing Status:   Future    Standing Expiration Date:   08/01/2024   Ferritin    Standing Status:   Future    Standing Expiration Date:   08/01/2024   Retic Panel    Standing Status:   Future    Standing Expiration Date:   08/01/2024   Vitamin B12    Standing Status:   Future    Standing Expiration Date:   08/01/2024   Follow up   1 year lab prior to MD cbc cmp b12, iron tibc ferritin   All questions were answered. The patient knows to call the clinic with any problems, questions or concerns.  Rickard Patience, MD, PhD [facility] Hematology Oncology 08/02/2023   REASON FOR VISIT Follow up for management of abnormal UPEP  HISTORY OF PRESENTING ILLNESS:  Morgan Livingston is a  77 y.o.  female with PMH  listed below who was referred to me for evaluation of abnormal UPEP.   She has CKD and follows up with Dr.Lateef. History of HTN. Reports doing well, denies bone pain.  Fatigue:  Chronic onset, perisistent, no aggravating or improving factors, no associated symptoms.  Reviewed labs which was done at [facility] office.  03/05/2018 [facility] done [facility] showed negative for M Spike UPEP: M spike 16.4  # colonoscopy done on 12 02/05/2016 with findings of polyps which were resected and retrieved.  Pathology showed negative for high-grade dysplasia or malignancy.  + Tubular adenoma   INTERVAL HISTORY Morgan Livingston is a 77 y.o. female who has above history reviewed by me today presents for follow up visit for management of thrombocytopenia and vitamin B12 deficiency,  Patient has no new complaints today. She takes oral iron supplementation daily.     Review of Systems  Constitutional:  Positive for malaise/fatigue. Negative for chills, fever and weight loss.  HENT:  Negative for sore throat.   Eyes:  Negative for redness.  Respiratory:  Negative for cough, shortness of breath and wheezing.   Cardiovascular:  Negative for chest pain, palpitations and leg swelling.  Gastrointestinal:  Negative for abdominal pain, blood in stool, heartburn, nausea and vomiting.  Genitourinary:  Negative for dysuria.  Musculoskeletal:  Negative for myalgias.  Skin:  Negative for rash.  Neurological:  Negative for dizziness,  tingling and tremors. Seizures: . Endo/Heme/Allergies:  Does not bruise/bleed easily.  Psychiatric/Behavioral:  Negative for hallucinations.     MEDICAL HISTORY:  Past Medical History:  Diagnosis Date   Allergy    Anemia    Aortic atherosclerosis (HCC)    Cancer (HCC)    Uterine Cancer   CKD (chronic kidney disease), stage III (HCC)    DDD (degenerative disc disease), lumbar    GERD (gastroesophageal reflux disease)    h/o   H. pylori infection    History  of IBS    Hyperlipidemia    Hypertension    Positive ANA (antinuclear antibody)    Secondary hyperparathyroidism (HCC)    Thrombocytopenia (HCC)     SURGICAL HISTORY: Past Surgical History:  Procedure Laterality Date   ABDOMINAL HYSTERECTOMY     ANTERIOR AND POSTERIOR REPAIR N/A 04/08/2023   Procedure: ANTERIOR (CYSTOCELE) AND POSTERIOR REPAIR (RECTOCELE);  Surgeon: Linzie Collin, MD;  Location: [facility] ORS;  Service: Gynecology;  Laterality: N/A;   BLADDER SUSPENSION Bilateral 04/08/2023   Procedure: TOT;  Surgeon: Linzie Collin, MD;  Location: [facility] ORS;  Service: Gynecology;  Laterality: Bilateral;   BREAST BIOPSY Right 04/09/2006   negative   BREAST BIOPSY Left 12/04/2017   fibroadenmatoid change with coarse calcs   COLONOSCOPY WITH PROPOFOL N/A 10/07/2018   Procedure: COLONOSCOPY WITH PROPOFOL;  Surgeon: Wyline Mood, MD;  Location: [facility] ENDOSCOPY;  Service: Gastroenterology;  Laterality: N/A;   COLONOSCOPY WITH PROPOFOL N/A 11/21/2021   Procedure: COLONOSCOPY WITH PROPOFOL;  Surgeon: Wyline Mood, MD;  Location: [facility] ENDOSCOPY;  Service: Gastroenterology;  Laterality: N/A;   LIPOMA EXCISION Left 04/15/2020   Procedure: EXCISION LIPOMA, left arm;  Surgeon: Duanne Guess, MD;  Location: [facility] ORS;  Service: General;  Laterality: Left;   XI ROBOTIC LAPAROSCOPIC ASSISTED APPENDECTOMY N/A 11/28/2021   Procedure: XI ROBOTIC LAPAROSCOPIC ASSISTED APPENDECTOMY;  Surgeon: Carolan Shiver, MD;  Location: [facility] ORS;  Service: General;  Laterality: N/A;    SOCIAL HISTORY: Social History   Socioeconomic History   Marital status: Married    Spouse name: Junior   Number of children: 2   Years of education: some college   Highest education level: 12th grade  Occupational History   Occupation: Scientist, 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   Tobacco Use   Smoking status: Former    Current packs/day: 0.00    Average packs/day: 1 pack/day for 2.0 years (2.0 ttl pk-yrs)    Types:  Cigarettes    Start date: 1961    Quit date: 1963    Years since quitting: 61.7   Smokeless tobacco: Never   Tobacco comments:    smoking cessation materials not required  Vaping Use   Vaping status: Never Used  Substance and Sexual Activity   Alcohol use: No    Alcohol/week: 0.0 standard drinks of alcohol   Drug use: No   Sexual activity: Not Currently    Partners: Male    Birth control/protection: Surgical  Other Topics Concern   Not on file  Social History Narrative   Married for 50 years    Social Determinants of Health   Financial Resource Strain: Low Risk  (01/03/2023)   Overall Financial Resource Strain (CARDIA)    Difficulty of Paying Living Expenses: Not hard at all  Food Insecurity: No Food Insecurity (01/03/2023)   Hunger Vital Sign    Worried About Running Out of Food in the Last Year: Never true    Ran Out of Food in  the Last Year: Never true  Transportation Needs: No Transportation Needs (01/03/2023)   PRAPARE - Administrator, Civil Service (Medical): No    Lack of Transportation (Non-Medical): No  Physical Activity: Inactive (01/03/2023)   Exercise Vital Sign    Days of Exercise per Week: 0 days    Minutes of Exercise per Session: 0 min  Stress: No Stress Concern Present (01/03/2023)   [employer] of Occupational Health - Occupational Stress Questionnaire    Feeling of Stress : Not at all  Social Connections: Socially Integrated (01/03/2023)   Social Connection and Isolation Panel [NHANES]    Frequency of Communication with Friends and Family: More than three times a week    Frequency of Social Gatherings with Friends and Family: More than three times a week    Attends Religious Services: More than 4 times per year    Active Member of [employer] or Organizations: Yes    Attends Banker Meetings: More than 4 times per year    Marital Status: Married  Catering manager Violence: Not At Risk (01/03/2023)   Humiliation, Afraid, Rape, and  Kick questionnaire    Fear of Current or Ex-Partner: No    Emotionally Abused: No    Physically Abused: No    Sexually Abused: No    FAMILY HISTORY: Family History  Problem Relation Age of Onset   Congestive Heart Failure Mother 103   Heart attack Father 29   Sinusitis Sister    Stomach cancer Sister    Uterine cancer Sister 69   Liver cancer Sister    Pancreatic cancer Sister    Dementia Sister        54   Heart attack Son    Breast cancer Neg Hx     ALLERGIES:  has No Known Allergies.  MEDICATIONS:  Current Outpatient Medications  Medication Sig Dispense Refill   acetaminophen (TYLENOL) 500 MG tablet Take 2 tablets (1,000 mg total) by mouth every 6 (six) hours as needed for mild pain or headache. 30 tablet 0   aspirin 81 MG tablet Take 81 mg by mouth daily.     Biotin 5 MG CAPS Take 5 mg by mouth daily.     carboxymethylcellulose (REFRESH PLUS) 0.5 % SOLN Place 1 drop into both eyes daily.     cholecalciferol (VITAMIN D) 1000 UNITS tablet Take 1,000 Units by mouth daily.      estradiol (ESTRACE) 0.1 MG/GM vaginal cream Place 0.25 Applicatorfuls vaginally at bedtime. 90 g 3   ezetimibe (ZETIA) 10 MG tablet Take 1 tablet (10 mg total) by mouth daily. (Patient taking differently: Take 10 mg by mouth at bedtime.) 90 tablet 3   fexofenadine (ALLEGRA) 180 MG tablet Take 180 mg by mouth every morning.     Iron-Vitamin C 65-125 MG TABS Take 1 tablet by mouth daily. 90 tablet 2   losartan-hydrochlorothiazide (HYZAAR) 100-12.5 MG tablet Take 1 tablet by mouth daily. 90 tablet 1   rosuvastatin (CRESTOR) 40 MG tablet Take 1 tablet (40 mg total) by mouth daily. 90 tablet 1   vitamin E 1000 UNIT capsule Take 1,000 Units by mouth daily.     No current facility-administered medications for this visit.     PHYSICAL EXAMINATION: ECOG PERFORMANCE STATUS: 1 - Symptomatic but completely ambulatory Vitals:   08/02/23 0950  BP: 123/72  Pulse: 61  Resp: 18  Temp: (!) 97 F (36.1 C)    Filed Weights   08/02/23 0950  Weight: 166  lb 1.6 oz (75.3 kg)    Physical Exam Constitutional:      General: She is not in acute distress. HENT:     Head: Normocephalic and atraumatic.  Eyes:     General: No scleral icterus.    Conjunctiva/sclera: Conjunctivae normal.     Pupils: Pupils are equal, round, and reactive to light.  Cardiovascular:     Rate and Rhythm: Normal rate and regular rhythm.     Heart sounds: Normal heart sounds.  Pulmonary:     Effort: Pulmonary effort is normal. No respiratory distress.     Breath sounds: Normal breath sounds. No wheezing or rales.  Chest:     Chest wall: No tenderness.  Abdominal:     General: Bowel sounds are normal. There is no distension.     Palpations: Abdomen is soft. There is no mass.     Tenderness: There is no abdominal tenderness.  Musculoskeletal:        General: No deformity. Normal range of motion.     Cervical back: Normal range of motion and neck supple.  Lymphadenopathy:     Cervical: No cervical adenopathy.  Skin:    General: Skin is warm and dry.     Findings: No erythema or rash.  Neurological:     Mental Status: She is alert and oriented to person, place, and time.     Cranial Nerves: No cranial nerve deficit.     Coordination: Coordination normal.  Psychiatric:        Behavior: Behavior normal.        Thought Content: Thought content normal.      LABORATORY DATA:  I have reviewed the data as listed Lab Results  Component Value Date   WBC 6.1 07/30/2023   HGB 11.8 (L) 07/30/2023   HCT 35.4 (L) 07/30/2023   MCV 94.1 07/30/2023   PLT 132 (L) 07/30/2023   Recent Labs    10/19/22 1030 02/18/23 1028 04/02/23 0920 07/30/23 0951  NA 140  --  140 138  K 4.3  --  3.5 4.5  CL 103  --  106 106  CO2 30  --  27 27  GLUCOSE 93  --  107* 103*  BUN 23  --  15 19  CREATININE 1.41*  --  1.40* 1.41*  CALCIUM 10.0  --  9.2 9.3  GFRNONAA  --   --  39* 39*  PROT 7.1 7.1  --  7.5  ALBUMIN  --   --   --   3.8  AST 21 22  --  31  ALT 13 15  --  26  ALKPHOS  --   --   --  45  BILITOT 0.5 0.3  --  0.7  BILIDIR  --  0.0  --   --   IBILI  --  0.3  --   --    Iron/TIBC/Ferritin/ %Sat    Component Value Date/Time   IRON 68 07/30/2023 0951   IRON 76 05/23/2015 0914   TIBC 330 07/30/2023 0951   TIBC 276 05/23/2015 0914   FERRITIN 26 07/30/2023 0951   FERRITIN 57 05/23/2015 0914   IRONPCTSAT 21 07/30/2023 0951   IRONPCTSAT 28 05/23/2015 0914    04/17/2018  SPEP no M spike. Normal serum free light chain ratio. UPEP, no M spike, elevated free kappa/lamda ratio at 20.75.   08/12/2018 UPEP no M spike

## 2023-08-02 NOTE — Assessment & Plan Note (Addendum)
Antiparietal and intrinsic factor antibodies are both negative. B12 level is within normal limit.  Continue B12 IM injection , every 3 months.

## 2023-08-02 NOTE — Assessment & Plan Note (Signed)
platelet counts are stable.  Patient has had previous work-up done including negative hepatitis, HIV.  Monitor counts.

## 2023-08-20 ENCOUNTER — Ambulatory Visit (INDEPENDENT_AMBULATORY_CARE_PROVIDER_SITE_OTHER): Payer: Medicare PPO

## 2023-08-20 DIAGNOSIS — H2513 Age-related nuclear cataract, bilateral: Secondary | ICD-10-CM | POA: Diagnosis not present

## 2023-08-20 DIAGNOSIS — Z23 Encounter for immunization: Secondary | ICD-10-CM

## 2023-08-20 DIAGNOSIS — H40003 Preglaucoma, unspecified, bilateral: Secondary | ICD-10-CM | POA: Diagnosis not present

## 2023-10-14 DIAGNOSIS — N1832 Chronic kidney disease, stage 3b: Secondary | ICD-10-CM | POA: Diagnosis not present

## 2023-10-14 DIAGNOSIS — I10 Essential (primary) hypertension: Secondary | ICD-10-CM | POA: Diagnosis not present

## 2023-10-17 DIAGNOSIS — N1832 Chronic kidney disease, stage 3b: Secondary | ICD-10-CM | POA: Diagnosis not present

## 2023-10-17 DIAGNOSIS — N2581 Secondary hyperparathyroidism of renal origin: Secondary | ICD-10-CM | POA: Diagnosis not present

## 2023-10-17 DIAGNOSIS — R809 Proteinuria, unspecified: Secondary | ICD-10-CM | POA: Diagnosis not present

## 2023-10-17 DIAGNOSIS — D631 Anemia in chronic kidney disease: Secondary | ICD-10-CM | POA: Diagnosis not present

## 2023-10-17 DIAGNOSIS — I10 Essential (primary) hypertension: Secondary | ICD-10-CM | POA: Diagnosis not present

## 2023-11-01 ENCOUNTER — Inpatient Hospital Stay: Payer: Medicare PPO | Attending: Oncology

## 2023-11-01 DIAGNOSIS — D631 Anemia in chronic kidney disease: Secondary | ICD-10-CM | POA: Diagnosis not present

## 2023-11-01 DIAGNOSIS — N1832 Chronic kidney disease, stage 3b: Secondary | ICD-10-CM | POA: Diagnosis not present

## 2023-11-01 DIAGNOSIS — Z87891 Personal history of nicotine dependence: Secondary | ICD-10-CM | POA: Diagnosis not present

## 2023-11-01 DIAGNOSIS — E538 Deficiency of other specified B group vitamins: Secondary | ICD-10-CM | POA: Diagnosis not present

## 2023-11-01 DIAGNOSIS — R5383 Other fatigue: Secondary | ICD-10-CM | POA: Diagnosis not present

## 2023-11-01 MED ORDER — CYANOCOBALAMIN 1000 MCG/ML IJ SOLN
1000.0000 ug | Freq: Once | INTRAMUSCULAR | Status: AC
  Administered 2023-11-01: 1000 ug via INTRAMUSCULAR
  Filled 2023-11-01: qty 1

## 2023-12-04 ENCOUNTER — Other Ambulatory Visit: Payer: Self-pay | Admitting: Family Medicine

## 2023-12-04 DIAGNOSIS — Z1231 Encounter for screening mammogram for malignant neoplasm of breast: Secondary | ICD-10-CM

## 2024-01-06 ENCOUNTER — Encounter: Payer: Self-pay | Admitting: Family Medicine

## 2024-01-06 ENCOUNTER — Ambulatory Visit (INDEPENDENT_AMBULATORY_CARE_PROVIDER_SITE_OTHER): Payer: Medicare PPO | Admitting: Family Medicine

## 2024-01-06 ENCOUNTER — Encounter: Payer: Self-pay | Admitting: Oncology

## 2024-01-06 ENCOUNTER — Other Ambulatory Visit: Payer: Self-pay | Admitting: Family Medicine

## 2024-01-06 VITALS — BP 126/76 | HR 85 | Temp 97.7°F | Resp 16 | Ht 65.0 in | Wt 166.5 lb

## 2024-01-06 DIAGNOSIS — D631 Anemia in chronic kidney disease: Secondary | ICD-10-CM

## 2024-01-06 DIAGNOSIS — I700 Atherosclerosis of aorta: Secondary | ICD-10-CM

## 2024-01-06 DIAGNOSIS — N1832 Chronic kidney disease, stage 3b: Secondary | ICD-10-CM

## 2024-01-06 DIAGNOSIS — R739 Hyperglycemia, unspecified: Secondary | ICD-10-CM

## 2024-01-06 DIAGNOSIS — E785 Hyperlipidemia, unspecified: Secondary | ICD-10-CM | POA: Diagnosis not present

## 2024-01-06 DIAGNOSIS — D472 Monoclonal gammopathy: Secondary | ICD-10-CM

## 2024-01-06 DIAGNOSIS — I10 Essential (primary) hypertension: Secondary | ICD-10-CM

## 2024-01-06 DIAGNOSIS — E538 Deficiency of other specified B group vitamins: Secondary | ICD-10-CM

## 2024-01-06 DIAGNOSIS — E559 Vitamin D deficiency, unspecified: Secondary | ICD-10-CM

## 2024-01-06 LAB — POCT GLYCOSYLATED HEMOGLOBIN (HGB A1C): Hemoglobin A1C: 6.7 % — AB (ref 4.0–5.6)

## 2024-01-06 MED ORDER — ROSUVASTATIN CALCIUM 40 MG PO TABS: 40.0000 mg | ORAL_TABLET | Freq: Every day | ORAL | 1 refills | Status: DC

## 2024-01-06 MED ORDER — LOSARTAN POTASSIUM-HCTZ 100-12.5 MG PO TABS: 1.0000 | ORAL_TABLET | Freq: Every day | ORAL | 1 refills | Status: DC

## 2024-01-06 NOTE — Progress Notes (Signed)
 Name: Morgan Livingston   MRN: [id number]    DOB: Jun 21, 1946   Date:01/06/2024       Progress Note  Subjective  Chief Complaint  Chief Complaint  Patient presents with   Medical Management of Chronic Issues   HPI   HTN: she is on Losartan hctz  100 /12.5  She denies  chest pain , palpitation, SOB or orthostatic changes .    B12 deficiency /Thrombocytopenia/MUGS  : under the care of Dr. Cathie Hoops, Under the care of hematologist,  stable , she gets monthly B12 injections . Last hemoglobin was 11.12 Oct 2023   Atherosclerosis aorta/dyslipidemia: found on X-ray of lumbar spine, she does not have claudication. She will continue statin , zetia and aspirin daily, last LDL improved but still above 100. May have familial hyperlipidemia - father had a heart attack at age 42 , reminded her of PCSK-9 injections to get LDL below 100 . We will recheck labs today, she agrees on switching to Nexlizet if LDL is still above 100 and continue statin   Proteinuria and CKI stage IIIb : seeing Dr. Cherylann Ratel,  She denies,  pruritis or decrease in urine output , last  urine micro 45 and stable , last Pth normalized, she is on ARB.   Positive for Sjogren's Anti SS-A and also ANA: on labs done 03/2018 by Dr. Cherylann Ratel , 03/2018 showed negative for M Spike UPEP spike and monitored by  Dr. Cathie Hoops. She denies dry mouth , but states she has a long history of dry eyes and not changed - uses eye drops. She is using premarin for vaginal dryness   Cystocele and rectocele: s/p repair, she is doing well, drinking more water and no longer having constipation    Hyperglycemia: no family history of diabetes, A1C was as high at 6.4 % but last visit it was 6 %  she still bakes and likes sweets. Today's A1C was 6.7% that is the first level above 6.5 %, she will try diet and exercise and we will recheck it in 4 months if still elevated we will change diagnosis to DM type II. She denies polyphagia, polydipsia or polyuria   Patient Active Problem List    Diagnosis Date Noted   Anemia in stage 3b chronic kidney disease (HCC) 08/02/2022   Thrombocytopenia (HCC) 08/02/2022   Thrombocytopathia (HCC) 04/20/2022   History of IBS 06/03/2020   Mass of arm, left    MGUS (monoclonal gammopathy of unknown significance) 09/01/2018   Secondary hyperparathyroidism (HCC) 09/01/2018   B12 deficiency 08/19/2018   Atherosclerosis of aorta (HCC) 07/25/2018   Proteinuria 05/09/2018   Positive ANA (antinuclear antibody) 04/02/2018   Bilateral dry eyes 04/02/2018   History of hysterectomy 05/23/2015   Stage 3b chronic kidney disease (HCC) 05/23/2015   Essential hypertension 05/22/2015   Chronic constipation 05/22/2015   DDD (degenerative disc disease), lumbar 05/22/2015   DD (diverticular disease) 05/22/2015   Dyslipidemia 05/22/2015   History of cervical cancer 05/22/2015   Helicobacter pylori gastrointestinal tract infection 05/22/2015   Blood glucose elevated 05/22/2015   Overweight (BMI 25.0-29.9) 05/22/2015   Perennial allergic rhinitis with seasonal variation 05/22/2015   Vitamin D deficiency 05/22/2015   Impingement syndrome of shoulder 01/14/2015    Past Surgical History:  Procedure Laterality Date   ABDOMINAL HYSTERECTOMY     ANTERIOR AND POSTERIOR REPAIR N/A 04/08/2023   Procedure: ANTERIOR (CYSTOCELE) AND POSTERIOR REPAIR (RECTOCELE);  Surgeon: Linzie Collin, MD;  Location: [facility] ORS;  Service: Gynecology;  Laterality:  N/A;   BLADDER SUSPENSION Bilateral 04/08/2023   Procedure: TOT;  Surgeon: Linzie Collin, MD;  Location: [facility] ORS;  Service: Gynecology;  Laterality: Bilateral;   BREAST BIOPSY Right 04/09/2006   negative   BREAST BIOPSY Left 12/04/2017   fibroadenmatoid change with coarse calcs   COLONOSCOPY WITH PROPOFOL N/A 10/07/2018   Procedure: COLONOSCOPY WITH PROPOFOL;  Surgeon: Wyline Mood, MD;  Location: [facility] ENDOSCOPY;  Service: Gastroenterology;  Laterality: N/A;   COLONOSCOPY WITH PROPOFOL N/A 11/21/2021   Procedure:  COLONOSCOPY WITH PROPOFOL;  Surgeon: Wyline Mood, MD;  Location: [facility] ENDOSCOPY;  Service: Gastroenterology;  Laterality: N/A;   LIPOMA EXCISION Left 04/15/2020   Procedure: EXCISION LIPOMA, left arm;  Surgeon: Duanne Guess, MD;  Location: [facility] ORS;  Service: General;  Laterality: Left;   XI ROBOTIC LAPAROSCOPIC ASSISTED APPENDECTOMY N/A 11/28/2021   Procedure: XI ROBOTIC LAPAROSCOPIC ASSISTED APPENDECTOMY;  Surgeon: Carolan Shiver, MD;  Location: [facility] ORS;  Service: General;  Laterality: N/A;    Family History  Problem Relation Age of Onset   Congestive Heart Failure Mother 41   Heart attack Father 71   Sinusitis Sister    Stomach cancer Sister    Uterine cancer Sister 77   Liver cancer Sister    Pancreatic cancer Sister    Dementia Sister        46   Heart attack Son    Breast cancer Neg Hx     Social History   Tobacco Use   Smoking status: Former    Current packs/day: 0.00    Average packs/day: 1 pack/day for 2.0 years (2.0 ttl pk-yrs)    Types: Cigarettes    Start date: 74    Quit date: 1963    Years since quitting: 62.2   Smokeless tobacco: Never   Tobacco comments:    smoking cessation materials not required  Substance Use Topics   Alcohol use: No    Alcohol/week: 0.0 standard drinks of alcohol     Current Outpatient Medications:    acetaminophen (TYLENOL) 500 MG tablet, Take 2 tablets (1,000 mg total) by mouth every 6 (six) hours as needed for mild pain or headache., Disp: 30 tablet, Rfl: 0   aspirin 81 MG tablet, Take 81 mg by mouth daily., Disp: , Rfl:    Biotin 5 MG CAPS, Take 5 mg by mouth daily., Disp: , Rfl:    carboxymethylcellulose (REFRESH PLUS) 0.5 % SOLN, Place 1 drop into both eyes daily., Disp: , Rfl:    cholecalciferol (VITAMIN D) 1000 UNITS tablet, Take 1,000 Units by mouth daily. , Disp: , Rfl:    estradiol (ESTRACE) 0.1 MG/GM vaginal cream, Place 0.25 Applicatorfuls vaginally at bedtime., Disp: 90 g, Rfl: 3   ezetimibe (ZETIA) 10  MG tablet, Take 1 tablet (10 mg total) by mouth daily. (Patient taking differently: Take 10 mg by mouth at bedtime.), Disp: 90 tablet, Rfl: 3   fexofenadine (ALLEGRA) 180 MG tablet, Take 180 mg by mouth every morning., Disp: , Rfl:    Iron-Vitamin C 65-125 MG TABS, Take 1 tablet by mouth daily., Disp: 90 tablet, Rfl: 2   losartan-hydrochlorothiazide (HYZAAR) 100-12.5 MG tablet, Take 1 tablet by mouth daily., Disp: 90 tablet, Rfl: 1   rosuvastatin (CRESTOR) 40 MG tablet, Take 1 tablet (40 mg total) by mouth daily., Disp: 90 tablet, Rfl: 1   vitamin E 1000 UNIT capsule, Take 1,000 Units by mouth daily., Disp: , Rfl:   No Known Allergies  I personally reviewed active problem list, medication  list, allergies, family history with the patient/caregiver today.   ROS  Ten systems reviewed and is negative except as mentioned in HPI    Objective  Vitals:   01/06/24 0826  BP: 126/76  Pulse: 85  Resp: 16  Temp: 97.7 F (36.5 C)  TempSrc: Oral  Weight: 166 lb 8 oz (75.5 kg)  Height: 5\' 5"  (1.651 m)    Body mass index is 27.71 kg/m.  Physical Exam  Constitutional: Patient appears well-developed and well-nourished. Obese  No distress.  HEENT: head atraumatic, normocephalic, pupils equal and reactive to light, neck supple Cardiovascular: Normal rate, regular rhythm and normal heart sounds.  No murmur heard. No BLE edema. Pulmonary/Chest: Effort normal and breath sounds normal. No respiratory distress. Abdominal: Soft.  There is no tenderness. Psychiatric: Patient has a normal mood and affect. behavior is normal. Judgment and thought content normal.   Diabetic Foot Exam:     PHQ2/9:    01/06/2024    8:27 AM 06/21/2023    9:49 AM 02/18/2023    9:32 AM 01/10/2023   10:36 AM 01/03/2023    9:12 AM  Depression screen PHQ 2/9  Decreased Interest 0 0 0 0 0  Down, Depressed, Hopeless 0 0 0 0 0  PHQ - 2 Score 0 0 0 0 0  Altered sleeping 0 0 0 0 0  Tired, decreased energy 0 0 0 0 0  Change  in appetite 0 0 0 0 0  Feeling bad or failure about yourself  0 0 0 0 0  Trouble concentrating 0 0 0 0 0  Moving slowly or fidgety/restless 0 0 0 0 0  Suicidal thoughts 0 0 0 0 0  PHQ-9 Score 0 0 0 0 0  Difficult doing work/chores Not difficult at all        phq 9 is negative  Fall Risk:    01/06/2024    8:19 AM 06/21/2023    9:49 AM 02/18/2023    9:39 AM 01/10/2023   10:36 AM 01/03/2023    9:09 AM  Fall Risk   Falls in the past year? 0 0 0 0 0  Number falls in past yr: 0 0 0 0 0  Injury with Fall? 0 0 0 0 0  Risk for fall due to : No Fall Risks No Fall Risks No Fall Risks No Fall Risks No Fall Risks  Follow up Falls prevention discussed;Education provided;Falls evaluation completed Falls prevention discussed Falls prevention discussed Falls prevention discussed Education provided;Falls prevention discussed     Assessment & Plan  1. Hyperglycemia (Primary)  - POCT glycosylated hemoglobin (Hb A1C)  2. Atherosclerosis of aorta (HCC)  - Lipid panel  3. Dyslipidemia  - Lipid panel - COMPLETE METABOLIC PANEL WITH GFR  4. MGUS (monoclonal gammopathy of unknown significance)  stable  5. Stage 3b chronic kidney disease (HCC)  Recheck labs today   6. Anemia in stage 3b chronic kidney disease (HCC)  Recently checked   7. Vitamin D deficiency  Continue vitamin D   8. B12 deficiency  Continue supplementation   9. Essential hypertension  - COMPLETE METABOLIC PANEL WITH GFR

## 2024-01-07 ENCOUNTER — Encounter: Payer: Self-pay | Admitting: Family Medicine

## 2024-01-07 LAB — COMPLETE METABOLIC PANEL WITH GFR
AG Ratio: 1.2 (calc) (ref 1.0–2.5)
ALT: 36 U/L — ABNORMAL HIGH (ref 6–29)
AST: 40 U/L — ABNORMAL HIGH (ref 10–35)
Albumin: 4.2 g/dL (ref 3.6–5.1)
Alkaline phosphatase (APISO): 47 U/L (ref 37–153)
BUN/Creatinine Ratio: 17 (calc) (ref 6–22)
BUN: 25 mg/dL (ref 7–25)
CO2: 28 mmol/L (ref 20–32)
Calcium: 9.9 mg/dL (ref 8.6–10.4)
Chloride: 104 mmol/L (ref 98–110)
Creat: 1.48 mg/dL — ABNORMAL HIGH (ref 0.60–1.00)
Globulin: 3.4 g/dL (ref 1.9–3.7)
Glucose, Bld: 99 mg/dL (ref 65–99)
Potassium: 4.3 mmol/L (ref 3.5–5.3)
Sodium: 140 mmol/L (ref 135–146)
Total Bilirubin: 0.4 mg/dL (ref 0.2–1.2)
Total Protein: 7.6 g/dL (ref 6.1–8.1)
eGFR: 36 mL/min/{1.73_m2} — ABNORMAL LOW (ref >=60)

## 2024-01-07 LAB — LIPID PANEL
Cholesterol: 211 mg/dL — ABNORMAL HIGH (ref <200)
HDL: 48 mg/dL — ABNORMAL LOW (ref >=50)
LDL Cholesterol (Calc): 138 mg/dL — ABNORMAL HIGH
Non-HDL Cholesterol (Calc): 163 mg/dL — ABNORMAL HIGH (ref <130)
Total CHOL/HDL Ratio: 4.4 (calc) (ref <5.0)
Triglycerides: 125 mg/dL (ref <150)

## 2024-01-08 ENCOUNTER — Other Ambulatory Visit: Payer: Self-pay | Admitting: Family Medicine

## 2024-01-08 DIAGNOSIS — E785 Hyperlipidemia, unspecified: Secondary | ICD-10-CM

## 2024-01-08 DIAGNOSIS — I700 Atherosclerosis of aorta: Secondary | ICD-10-CM

## 2024-01-08 MED ORDER — NEXLIZET 180-10 MG PO TABS: 1.0000 | ORAL_TABLET | Freq: Every evening | ORAL | 3 refills | Status: DC

## 2024-01-08 NOTE — Telephone Encounter (Signed)
 Last f/u 01/2024

## 2024-01-09 ENCOUNTER — Ambulatory Visit: Payer: Medicare PPO

## 2024-01-09 ENCOUNTER — Other Ambulatory Visit: Payer: Self-pay | Admitting: Family Medicine

## 2024-01-09 DIAGNOSIS — Z0000 Encounter for general adult medical examination without abnormal findings: Secondary | ICD-10-CM

## 2024-01-09 DIAGNOSIS — I700 Atherosclerosis of aorta: Secondary | ICD-10-CM

## 2024-01-09 DIAGNOSIS — E785 Hyperlipidemia, unspecified: Secondary | ICD-10-CM

## 2024-01-09 NOTE — Telephone Encounter (Signed)
 Requested medication (s) are due for refill today: Yes  Requested medication (s) are on the active medication list: Yes  Last refill:  01/08/24  Future visit scheduled: Yes  Notes to clinic:  Unable to refill due to no refill protocol for this medication.      Requested Prescriptions  Pending Prescriptions Disp Refills   Bempedoic Acid-Ezetimibe (NEXLIZET) 180-10 MG TABS 90 tablet 3    Sig: Take 1 tablet by mouth at bedtime. In place of Zetia     Off-Protocol Failed - 01/09/2024  5:20 PM      Failed - Medication not assigned to a protocol, review manually.      Passed - Valid encounter within last 12 months    Recent Outpatient Visits           6 months ago Stage 3b chronic kidney disease [facility])   [place] [facility] Alba Cory, MD   10 months ago Atherosclerosis of aorta [facility])   [place] [facility] Alba Cory, MD   12 months ago Cystocele with rectocele   [facility] Alba Cory, MD   1 year ago Cystocele with rectocele   [facility] Alba Cory, MD   1 year ago Nausea and vomiting, unspecified vomiting type   [facility] Margarita Mail, DO       Future Appointments             In 4 months Carlynn Purl, Danna Hefty, MD [facility], [facility]

## 2024-01-09 NOTE — Telephone Encounter (Signed)
 Copied from CRM [id number]. Topic: Clinical - Medication Refill >> Jan 09, 2024  3:55 PM Priscille Loveless wrote: Most Recent Primary Care Visit:  Provider: Marcos Eke  Department: [facility]  Visit Type: NURSE VISIT  Date: 08/20/2023  Medication: Bempedoic Acid-Ezetimibe (NEXLIZET) 180-10 MG TABS  Has the patient contacted their pharmacy? No   Is this the correct pharmacy for this prescription? Yes  This is the patient's preferred pharmacy:  [facility] [address], [place] - [address] [place] [address] Phone: [phone] Fax: [phone]   Has the prescription been filled recently? Yes  Is the patient out of the medication? Yes  Has the patient been seen for an appointment in the last year OR does the patient have an upcoming appointment? Yes  Can we respond through MyChart? Yes  Agent: Please be advised that Rx refills may take up to 3 business days. We ask that you follow-up with your pharmacy.

## 2024-01-09 NOTE — Progress Notes (Signed)
 Subjective:   Morgan Livingston is a 78 y.o. who presents for a Medicare Wellness preventive visit.  Visit Complete: Virtual I connected with  Antony Haste on 01/09/24 by a audio enabled telemedicine application and verified that I am speaking with the correct person using two identifiers.  Patient Location: Home  Provider Location: Office/Clinic  I discussed the limitations of evaluation and management by telemedicine. The patient expressed understanding and agreed to proceed.  Vital Signs: Because this visit was a virtual/telehealth visit, some criteria may be missing or patient reported. Any vitals not documented were not able to be obtained and vitals that have been documented are patient reported.  VideoDeclined- This patient declined Librarian, academic. Therefore the visit was completed with audio only.  AWV Questionnaire: No: Patient Medicare AWV questionnaire was not completed prior to this visit.  Cardiac Risk Factors include: advanced age (>68men, >51 women);hypertension;dyslipidemia;sedentary lifestyle     Objective:    There were no vitals filed for this visit. There is no height or weight on file to calculate BMI.     01/09/2024    9:35 AM 08/02/2023    9:42 AM 04/08/2023    6:21 AM 01/03/2023    9:16 AM 08/02/2022    9:53 AM 01/02/2022    9:35 AM 11/28/2021    5:00 PM  Advanced Directives  Does Patient Have a Medical Advance Directive? No No No No No No No  Would patient like information on creating a medical advance directive? No - Patient declined  No - Patient declined Yes (ED - Information included in AVS)  No - Patient declined No - Patient declined    Current Medications (verified) Outpatient Encounter Medications as of 01/09/2024  Medication Sig   acetaminophen (TYLENOL) 500 MG tablet Take 2 tablets (1,000 mg total) by mouth every 6 (six) hours as needed for mild pain or headache.   aspirin 81 MG tablet Take 81 mg by mouth daily.    Bempedoic Acid-Ezetimibe (NEXLIZET) 180-10 MG TABS Take 1 tablet by mouth at bedtime. In place of Zetia   Biotin 5 MG CAPS Take 5 mg by mouth daily.   carboxymethylcellulose (REFRESH PLUS) 0.5 % SOLN Place 1 drop into both eyes daily.   cholecalciferol (VITAMIN D) 1000 UNITS tablet Take 1,000 Units by mouth daily.    fexofenadine (ALLEGRA) 180 MG tablet Take 180 mg by mouth every morning.   Iron-Vitamin C 65-125 MG TABS Take 1 tablet by mouth daily.   losartan-hydrochlorothiazide (HYZAAR) 100-12.5 MG tablet Take 1 tablet by mouth daily.   rosuvastatin (CRESTOR) 40 MG tablet Take 1 tablet (40 mg total) by mouth daily.   vitamin E 1000 UNIT capsule Take 1,000 Units by mouth daily.   estradiol (ESTRACE) 0.1 MG/GM vaginal cream Place 0.25 Applicatorfuls vaginally at bedtime. (Patient not taking: Reported on 01/09/2024)   [DISCONTINUED] ezetimibe (ZETIA) 10 MG tablet Take 1 tablet (10 mg total) by mouth daily. (Patient taking differently: Take 10 mg by mouth at bedtime.)   No facility-administered encounter medications on file as of 01/09/2024.    Allergies (verified) Patient has no known allergies.   History: Past Medical History:  Diagnosis Date   Allergy    Anemia    Aortic atherosclerosis (HCC)    Cancer (HCC)    Uterine Cancer   CKD (chronic kidney disease), stage III (HCC)    DDD (degenerative disc disease), lumbar    GERD (gastroesophageal reflux disease)    h/o  H. pylori infection    History of IBS    Hyperlipidemia    Hypertension    Positive ANA (antinuclear antibody)    Secondary hyperparathyroidism (HCC)    Thrombocytopenia (HCC)    Past Surgical History:  Procedure Laterality Date   ABDOMINAL HYSTERECTOMY     ANTERIOR AND POSTERIOR REPAIR N/A 04/08/2023   Procedure: ANTERIOR (CYSTOCELE) AND POSTERIOR REPAIR (RECTOCELE);  Surgeon: Linzie Collin, MD;  Location: [facility];  Service: Gynecology;  Laterality: N/A;   BLADDER SUSPENSION Bilateral 04/08/2023   Procedure:  TOT;  Surgeon: Linzie Collin, MD;  Location: [facility];  Service: Gynecology;  Laterality: Bilateral;   BREAST BIOPSY Right 04/09/2006   negative   BREAST BIOPSY Left 12/04/2017   fibroadenmatoid change with coarse calcs   COLONOSCOPY WITH PROPOFOL N/A 10/07/2018   Procedure: COLONOSCOPY WITH PROPOFOL;  Surgeon: Wyline Mood, MD;  Location[facility];  Service: Gastroenterology;  Laterality: N/A;   COLONOSCOPY WITH PROPOFOL N/A 11/21/2021   Procedure: COLONOSCOPY WITH PROPOFOL;  Surgeon: Wyline Mood, MD;  Location: [facility] ENDOSCOPY;  Service: Gastroenterology;  Laterality: N/A;   LIPOMA EXCISION Left 04/15/2020   Procedure: EXCISION LIPOMA, left arm;  Surgeon: Duanne Guess, MD;  Location: [facility] OR[facility];  Service: General;  Laterality: Left;   XI ROBOTIC LAPAROSCOPIC ASSISTED APPENDECTOMY N/A 11/28/2021   Procedure: XI ROBOTIC LAPAROSCOPIC ASSISTED APPENDECTOMY;  Surgeon: Carolan Shiver, MD;  Location: [facility] OR[facility];  Service: General;  Laterality: N/A;   Family History  Problem Relation Age of Onset   Congestive Heart Failure Mother 75   Heart attack Father 25   Sinusitis Sister    Stomach cancer Sister    Uterine cancer Sister 51   Liver cancer Sister    Pancreatic cancer Sister    Dementia Sister        5   Heart attack Son    Breast cancer Neg Hx    Social History   Socioeconomic History   Marital status: Married    Spouse name: Junior   Number of children: 2   Years of education: some college   Highest education level: Some college, no degree  Occupational History   Occupation: Scientist, 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   Tobacco Use   Smoking status: Former    Current packs/day: 0.00    Average packs/day: 1 pack/day for 2.0 years (2.0 ttl pk-yrs)    Types: Cigarettes    Start date: 23    Quit date: 1963    Years since quitting: 62.2   Smokeless tobacco: Never   Tobacco comments:    smoking cessation materials not required  Vaping Use   Vaping status:  Never Used  Substance and Sexual Activity   Alcohol use: No    Alcohol/week: 0.0 standard drinks of alcohol   Drug use: No   Sexual activity: Not Currently    Partners: Male    Birth control/protection: Surgical  Other Topics Concern   Not on file  Social History Narrative   Married for 50 years    Social Drivers of Corporate investment banker Strain: Low Risk  (01/09/2024)   Overall Financial Resource Strain (CARDIA)    Difficulty of Paying Living Expenses: Not hard at all  Food Insecurity: No Food Insecurity (01/09/2024)   Hunger Vital Sign    Worried About Running Out of Food in the Last Year: Never true    Ran Out of Food in the Last Year: Never true  Transportation Needs: No Transportation  Needs (01/09/2024)   PRAPARE - Administrator, Civil Service (Medical): No    Lack of Transportation (Non-Medical): No  Physical Activity: Insufficiently Active (01/09/2024)   Exercise Vital Sign    Days of Exercise per Week: 2 days    Minutes of Exercise per Session: 10 min  Stress: No Stress Concern Present (01/09/2024)   [employer] of Occupational Health - Occupational Stress Questionnaire    Feeling of Stress : Only a little  Social Connections: Socially Integrated (01/09/2024)   Social Connection and Isolation Panel [NHANES]    Frequency of Communication with Friends and Family: More than three times a week    Frequency of Social Gatherings with Friends and Family: More than three times a week    Attends Religious Services: More than 4 times per year    Active Member of [employer] or Organizations: Yes    Attends Engineer, structural: More than 4 times per year    Marital Status: Married    Tobacco Counseling Counseling given: Not Answered Tobacco comments: smoking cessation materials not required    Clinical Intake:  Pre-visit preparation completed: Yes  Pain : No/denies pain     BMI - recorded: 27.6 Nutritional Status: BMI 25 -29  Overweight Nutritional Risks: None Diabetes: No  How often do you need to have someone help you when you read instructions, pamphlets, or other written materials from your doctor or pharmacy?: 1 - Never  Interpreter Needed?: No  Information entered by :: Kennedy Bucker, LPN   Activities of Daily Living     01/09/2024    9:36 AM 01/08/2024    2:25 PM  In your present state of health, do you have any difficulty performing the following activities:  Hearing? 0 0  Vision? 0 0  Difficulty concentrating or making decisions? 0 0  Walking or climbing stairs? 0 0  Dressing or bathing? 0 0  Doing errands, shopping? 0 0  Preparing Food and eating ? N N  Using the Toilet? N N  In the past six months, have you accidently leaked urine? Y Y  Do you have problems with loss of bowel control? N N  Managing your Medications? N N  Managing your Finances? N N  Housekeeping or managing your Housekeeping? N N    Patient Care Team: Alba Cory, MD as PCP - General (Family Medicine) Rickard Patience, MD as Consulting Physician (Oncology) Mady Haagensen, MD (Nephrology) Alwyn Pea, MD as Consulting Physician (Cardiology) Wyline Mood, MD as Consulting Physician (Gastroenterology) Lockie Mola, MD as Referring Physician (Ophthalmology)  Indicate any recent Medical Services you may have received from other than [facility] providers in the past year (date may be approximate).     Assessment:   This is a routine wellness examination for Marshall.  Hearing/Vision screen Hearing Screening - Comments:: NO AIDS Vision Screening - Comments:: WEARS GLASSES ALL THE TIME- [facility] DR.BRASINGTON   Goals Addressed             This Visit's Progress    DIET - EAT MORE FRUITS AND VEGETABLES         Depression Screen     01/09/2024    9:33 AM 01/06/2024    8:27 AM 06/21/2023    9:49 AM 02/18/2023    9:32 AM 01/10/2023   10:36 AM 01/03/2023    9:12 AM 11/28/2022    2:29 PM  PHQ 2/9 Scores  PHQ - 2  Score 0 0 0 0 0  0 0  PHQ- 9 Score 0 0 0 0 0 0 0    Fall Risk     01/09/2024    9:36 AM 01/08/2024    2:25 PM 01/06/2024    8:19 AM 06/21/2023    9:49 AM 02/18/2023    9:39 AM  Fall Risk   Falls in the past year? 0 0 0 0 0  Number falls in past yr: 0  0 0 0  Injury with Fall? 0 0 0 0 0  Risk for fall due to : No Fall Risks  No Fall Risks No Fall Risks No Fall Risks  Follow up Falls prevention discussed;Falls evaluation completed  Falls prevention discussed;Education provided;Falls evaluation completed Falls prevention discussed Falls prevention discussed    MEDICARE RISK AT HOME:  Medicare Risk at Home Any stairs in or around the home?: No If so, are there any without handrails?: No Home free of loose throw rugs in walkways, pet beds, electrical cords, etc?: Yes Adequate lighting in your home to reduce risk of falls?: Yes Life alert?: No Use of a cane, walker or w/c?: No Grab bars in the bathroom?: No Shower chair or bench in shower?: No Elevated toilet seat or a handicapped toilet?: No  TIMED UP AND GO:  Was the test performed?  No  Cognitive Function: 6CIT completed        01/09/2024    9:37 AM 01/03/2023    9:20 AM 12/29/2019    9:34 AM 12/23/2018    9:46 AM 12/20/2017   10:44 AM  6CIT Screen  What Year? 0 points 0 points 0 points 0 points 0 points  What month? 0 points 0 points 0 points 0 points 0 points  What time? 0 points 0 points 0 points 0 points 0 points  Count back from 20 0 points 0 points 0 points 0 points 0 points  Months in reverse 0 points 0 points 0 points 0 points 0 points  Repeat phrase 0 points 0 points 2 points 0 points 0 points  Total Score 0 points 0 points 2 points 0 points 0 points    Immunizations Immunization History  Administered Date(s) Administered   Fluad Quad(high Dose 65+) 08/28/2019, 07/14/2020, 08/13/2022   Fluad Trivalent(High Dose 65+) 08/20/2023   Influenza, High Dose Seasonal PF 09/23/2015, 08/22/2016, 08/14/2017, 07/25/2018,  08/09/2021   Influenza-Unspecified 09/20/2014   Moderna Covid-19 Vaccine Bivalent Booster 44yrs & up 11/23/2021, 08/21/2022   Moderna Sars-Covid-2 Vaccination 12/16/2019, 01/13/2020, 09/05/2020   Pneumococcal Conjugate-13 01/12/2014   Pneumococcal Polysaccharide-23 12/28/2014   Respiratory Syncytial Virus Vaccine,Recomb Aduvanted(Arexvy) 10/31/2022   Tdap 05/14/2017   Tetanus 02/25/2007   Zoster Recombinant(Shingrix) 11/02/2021, 02/28/2022   Zoster, Live 08/11/2012    Screening Tests Health Maintenance  Topic Date Due   MAMMOGRAM  12/22/2023   COVID-19 Vaccine (6 - 2024-25 season) 01/22/2024 (Originally 07/07/2023)   Colonoscopy  11/21/2024   Medicare Annual Wellness (AWV)  01/08/2025   DEXA SCAN  01/10/2025   DTaP/Tdap/Td (2 - Td or Tdap) 05/15/2027   Pneumonia Vaccine 82+ Years old  Completed   INFLUENZA VACCINE  Completed   Hepatitis C Screening  Completed   Zoster Vaccines- Shingrix  Completed   HPV VACCINES  Aged Out    Health Maintenance  Health Maintenance Due  Topic Date Due   MAMMOGRAM  12/22/2023   Health Maintenance Items Addressed: Mammogram scheduled COLOGUARD UP TO DATE, DEXA UP TO DATE  Additional Screening:  Vision Screening: Recommended annual ophthalmology exams for early  detection of glaucoma and other disorders of the eye.  Dental Screening: Recommended annual dental exams for proper oral hygiene  Community Resource Referral / Chronic Care Management: CRR required this visit?  No   CCM required this visit?  No     Plan:     I have personally reviewed and noted the following in the patient's chart:   Medical and social history Use of alcohol, tobacco or illicit drugs  Current medications and supplements including opioid prescriptions. Patient is not currently taking opioid prescriptions. Functional ability and status Nutritional status Physical activity Advanced directives List of other physicians Hospitalizations, surgeries, and ER  visits in previous 12 months Vitals Screenings to include cognitive, depression, and falls Referrals and appointments  In addition, I have reviewed and discussed with patient certain preventive protocols, quality metrics, and best practice recommendations. A written personalized care plan for preventive services as well as general preventive health recommendations were provided to patient.     Hal Hope, LPN   11/10/1094   After Visit Summary: (MyChart) Due to this being a telephonic visit, the after visit summary with patients personalized plan was offered to patient via MyChart   Notes: Nothing significant to report at this time.

## 2024-01-09 NOTE — Patient Instructions (Addendum)
 Morgan Livingston , Thank you for taking time to come for your Medicare Wellness Visit. I appreciate your ongoing commitment to your health goals. Please review the following plan we discussed and let me know if I can assist you in the future.   Referrals/Orders/Follow-Ups/Clinician Recommendations: NONE  This is a list of the screening recommended for you and due dates:  Health Maintenance  Topic Date Due   Mammogram  12/22/2023   COVID-19 Vaccine (6 - 2024-25 season) 01/22/2024*   Colon Cancer Screening  11/21/2024   Medicare Annual Wellness Visit  01/08/2025   DEXA scan (bone density measurement)  01/10/2025   DTaP/Tdap/Td vaccine (2 - Td or Tdap) 05/15/2027   Pneumonia Vaccine  Completed   Flu Shot  Completed   Hepatitis C Screening  Completed   Zoster (Shingles) Vaccine  Completed   HPV Vaccine  Aged Out  *Topic was postponed. The date shown is not the original due date.    Advanced directives: (ACP Link)Information on Advanced Care Planning can be found at [facility] Advance Health Care Directives Advance Health Care Directives ([email or web address]   Next Medicare Annual Wellness Visit scheduled for next year: Yes   01/14/25 @ 8:50 AM BY PHONE

## 2024-01-14 ENCOUNTER — Other Ambulatory Visit: Payer: Self-pay | Admitting: Family Medicine

## 2024-01-14 DIAGNOSIS — I700 Atherosclerosis of aorta: Secondary | ICD-10-CM

## 2024-01-15 ENCOUNTER — Ambulatory Visit
Admission: RE | Admit: 2024-01-15 | Discharge: 2024-01-15 | Disposition: A | Discharge status: Home / Self Care | Payer: Medicare PPO | Source: Ambulatory Visit | Attending: Family Medicine | Admitting: Family Medicine

## 2024-01-15 DIAGNOSIS — Z1231 Encounter for screening mammogram for malignant neoplasm of breast: Secondary | ICD-10-CM | POA: Diagnosis present

## 2024-01-30 ENCOUNTER — Inpatient Hospital Stay: Payer: Medicare PPO | Attending: Oncology

## 2024-01-30 DIAGNOSIS — E538 Deficiency of other specified B group vitamins: Secondary | ICD-10-CM | POA: Insufficient documentation

## 2024-01-30 MED ORDER — CYANOCOBALAMIN 1000 MCG/ML IJ SOLN
1000.0000 ug | Freq: Once | INTRAMUSCULAR | Status: AC
  Administered 2024-01-30: 1000 ug via INTRAMUSCULAR
  Filled 2024-01-30: qty 1

## 2024-05-01 ENCOUNTER — Inpatient Hospital Stay: Payer: Medicare PPO | Attending: Oncology

## 2024-05-01 DIAGNOSIS — E538 Deficiency of other specified B group vitamins: Secondary | ICD-10-CM | POA: Insufficient documentation

## 2024-05-01 MED ORDER — CYANOCOBALAMIN 1000 MCG/ML IJ SOLN
1000.0000 ug | Freq: Once | INTRAMUSCULAR | Status: AC
  Administered 2024-05-01: 1000 ug via INTRAMUSCULAR
  Filled 2024-05-01: qty 1

## 2024-05-04 ENCOUNTER — Ambulatory Visit: Payer: Self-pay

## 2024-05-04 NOTE — Telephone Encounter (Signed)
 FYI Only or Action Required?: FYI only for provider.  Patient was last seen in primary care on 01/06/2024 by Glenard Mire, MD. Called Nurse Triage reporting Abdominal Pain. Symptoms began several days ago. Interventions attempted: Rest, hydration, or home remedies and Dietary changes. Symptoms are: unchanged.  Triage Disposition: See Physician Within 24 Hours  Patient/caregiver understands and will follow disposition?: Yes     Copied from CRM [id number]. Topic: Clinical - Red Word Triage >> May 04, 2024  4:27 PM Fonda T wrote: Red Word that prompted transfer to Nurse Triage: Patient states she is having nausea, with occasional abdominal pain. Reason for Disposition  [1] MODERATE pain (e.g., interferes with normal activities) AND [2] pain comes and goes (cramps) AND [3] present > 24 hours  (Exception: Pain with Vomiting or Diarrhea - see that Guideline.)  Answer Assessment - Initial Assessment Questions 1. LOCATION: Where does it hurt?      Lower stomach Nausea with eating Endorses cramping last week while eating - self resolved 2. RADIATION: Does the pain shoot anywhere else? (e.g., chest, back)     denies 3. ONSET: When did the pain begin? (e.g., minutes, hours or days ago)      Last week 4. SUDDEN: Gradual or sudden onset?     sudden 5. PATTERN Does the pain come and go, or is it constant?    - If it comes and goes: How long does it last? Do you have pain now?     (Note: Comes and goes means the pain is intermittent. It goes away completely between bouts.)    - If constant: Is it getting better, staying the same, or getting worse?      (Note: Constant means the pain never goes away completely; most serious pain is constant and gets worse.)      Comes and goes, esp with eating Endorses hx of diverticulitis but denies that this feels similar 6. SEVERITY: How bad is the pain?  (e.g., Scale 1-10; mild, moderate, or severe)    - MILD (1-3): Doesn't interfere with  normal activities, abdomen soft and not tender to touch.     - MODERATE (4-7): Interferes with normal activities or awakens from sleep, abdomen tender to touch.     - SEVERE (8-10): Excruciating pain, doubled over, unable to do any normal activities.       4-5/10 Has not tried any OTC meds  7. RECURRENT SYMPTOM: Have you ever had this type of stomach pain before? If Yes, ask: When was the last time? and What happened that time?      First time 8. CAUSE: What do you think is causing the stomach pain?     unknown 9. RELIEVING/AGGRAVATING FACTORS: What makes it better or worse? (e.g., antacids, bending or twisting motion, bowel movement)     Worse with eating 10. OTHER SYMPTOMS: Do you have any other symptoms? (e.g., back pain, diarrhea, fever, urination pain, vomiting)       denies 11. PREGNANCY: Is there any chance you are pregnant? When was your last menstrual period?       N/a  Protocols used: Abdominal Pain - Female-A-AH

## 2024-05-06 ENCOUNTER — Ambulatory Visit (INDEPENDENT_AMBULATORY_CARE_PROVIDER_SITE_OTHER): Admitting: Family Medicine

## 2024-05-06 ENCOUNTER — Encounter: Payer: Self-pay | Admitting: Family Medicine

## 2024-05-06 VITALS — BP 128/72 | HR 67 | Temp 97.9°F | Resp 16 | Ht 65.0 in | Wt 159.0 lb

## 2024-05-06 DIAGNOSIS — R1030 Lower abdominal pain, unspecified: Secondary | ICD-10-CM | POA: Diagnosis not present

## 2024-05-06 DIAGNOSIS — R109 Unspecified abdominal pain: Secondary | ICD-10-CM

## 2024-05-06 DIAGNOSIS — R110 Nausea: Secondary | ICD-10-CM

## 2024-05-06 LAB — POCT URINALYSIS DIPSTICK
Appearance: NORMAL
Bilirubin, UA: NEGATIVE
Blood, UA: NEGATIVE
Glucose, UA: NEGATIVE
Ketones, UA: NEGATIVE
Leukocytes, UA: NEGATIVE
Protein, UA: NEGATIVE
Spec Grav, UA: 1.01 (ref 1.010–1.025)
Urobilinogen, UA: 0.2 U/dL
pH, UA: 6 (ref 5.0–8.0)

## 2024-05-06 MED ORDER — FAMOTIDINE 20 MG PO TABS: 20.0000 mg | ORAL_TABLET | Freq: Two times a day (BID) | ORAL | 1 refills | Status: DC | PRN

## 2024-05-06 MED ORDER — ONDANSETRON 4 MG PO TBDP: 4.0000 mg | ORAL_TABLET | Freq: Three times a day (TID) | ORAL | 0 refills | Status: DC | PRN

## 2024-05-06 NOTE — Progress Notes (Signed)
 Patient ID: Morgan Livingston, female    DOB: 01-29-1946, 78 y.o.   MRN: [id number]  PCP: Glenard Mire, MD  Chief Complaint  Patient presents with   Abdominal Pain    Hard cramps x1 week. No vomiting or diarrhea   Nausea    W/eating.     Subjective:   Morgan Livingston is a 78 y.o. female, presents to clinic with CC of the following:  HPI  Low abd cramping onset one week ago gradually improved with no urinary sx or bowel changes, only other sx has been some nausea that also is improving.  She's had no back pain, fever, sweats, chills.   She does have hx of diverticulitis and h.pylori  Pain and cramping is not like her past diverticulitis and she has mild nausea with eating but no pain bloating indigestion or reflux She is here today because she wanted to get checked before her vacation later this month  Patient Active Problem List   Diagnosis Date Noted   Anemia in stage 3b chronic kidney disease (HCC) 08/02/2022   Thrombocytopenia (HCC) 08/02/2022   Thrombocytopathia (HCC) 04/20/2022   History of IBS 06/03/2020   Mass of arm, left    MGUS (monoclonal gammopathy of unknown significance) 09/01/2018   Secondary hyperparathyroidism (HCC) 09/01/2018   B12 deficiency 08/19/2018   Atherosclerosis of aorta (HCC) 07/25/2018   Proteinuria 05/09/2018   Positive ANA (antinuclear antibody) 04/02/2018   Bilateral dry eyes 04/02/2018   History of hysterectomy 05/23/2015   Stage 3b chronic kidney disease (HCC) 05/23/2015   Essential hypertension 05/22/2015   Chronic constipation 05/22/2015   DDD (degenerative disc disease), lumbar 05/22/2015   DD (diverticular disease) 05/22/2015   Dyslipidemia 05/22/2015   History of cervical cancer 05/22/2015   Helicobacter pylori gastrointestinal tract infection 05/22/2015   Blood glucose elevated 05/22/2015   Overweight (BMI 25.0-29.9) 05/22/2015   Perennial allergic rhinitis with seasonal variation 05/22/2015   Vitamin D  deficiency  05/22/2015   Impingement syndrome of shoulder 01/14/2015      Current Outpatient Medications:    acetaminophen  (TYLENOL ) 500 MG tablet, Take 2 tablets (1,000 mg total) by mouth every 6 (six) hours as needed for mild pain or headache., Disp: 30 tablet, Rfl: 0   aspirin 81 MG tablet, Take 81 mg by mouth daily., Disp: , Rfl:    Bempedoic Acid-Ezetimibe  (NEXLIZET ) 180-10 MG TABS, Take 1 tablet by mouth at bedtime. In place of Zetia , Disp: 90 tablet, Rfl: 3   Biotin 5 MG CAPS, Take 5 mg by mouth daily., Disp: , Rfl:    carboxymethylcellulose (REFRESH PLUS) 0.5 % SOLN, Place 1 drop into both eyes daily., Disp: , Rfl:    cholecalciferol (VITAMIN D ) 1000 UNITS tablet, Take 1,000 Units by mouth daily. , Disp: , Rfl:    fexofenadine (ALLEGRA) 180 MG tablet, Take 180 mg by mouth every morning., Disp: , Rfl:    Iron -Vitamin C  65-125 MG TABS, Take 1 tablet by mouth daily., Disp: 90 tablet, Rfl: 2   losartan -hydrochlorothiazide  (HYZAAR) 100-12.5 MG tablet, Take 1 tablet by mouth daily., Disp: 90 tablet, Rfl: 1   rosuvastatin  (CRESTOR ) 40 MG tablet, Take 1 tablet (40 mg total) by mouth daily., Disp: 90 tablet, Rfl: 1   vitamin E 1000 UNIT capsule, Take 1,000 Units by mouth daily., Disp: , Rfl:    No Known Allergies   Social History   Tobacco Use   Smoking status: Former    Current packs/day: 0.00    Average  packs/day: 1 pack/day for 2.0 years (2.0 ttl pk-yrs)    Types: Cigarettes    Start date: 64    Quit date: 1963    Years since quitting: 62.5   Smokeless tobacco: Never   Tobacco comments:    smoking cessation materials not required  Vaping Use   Vaping status: Never Used  Substance Use Topics   Alcohol use: No    Alcohol/week: 0.0 standard drinks of alcohol   Drug use: No      Chart Review Today: I personally reviewed active problem list, medication list, allergies, family history, social history, health maintenance, notes from last encounter, lab results, imaging with the  patient/caregiver today.   Review of Systems  Constitutional: Negative.   HENT: Negative.    Eyes: Negative.   Respiratory: Negative.    Cardiovascular: Negative.   Gastrointestinal: Negative.   Endocrine: Negative.   Genitourinary: Negative.   Musculoskeletal: Negative.   Skin: Negative.   Allergic/Immunologic: Negative.   Neurological: Negative.   Hematological: Negative.   Psychiatric/Behavioral: Negative.    All other systems reviewed and are negative.      Objective:   Vitals:   05/06/24 0942  BP: 128/72  Pulse: 67  Resp: 16  Temp: 97.9 F (36.6 C)  SpO2: 98%  Weight: 159 lb (72.1 kg)  Height: 5' 5 (1.651 m)    Body mass index is 26.46 kg/m.  Physical Exam Vitals and nursing note reviewed.  Constitutional:      General: She is not in acute distress.    Appearance: Normal appearance. She is well-developed. She is not ill-appearing, toxic-appearing or diaphoretic.  HENT:     Head: Normocephalic and atraumatic.     Right Ear: External ear normal.     Left Ear: External ear normal.     Nose: Nose normal.  Eyes:     General: No scleral icterus.       Right eye: No discharge.        Left eye: No discharge.     Conjunctiva/sclera: Conjunctivae normal.  Neck:     Trachea: No tracheal deviation.  Cardiovascular:     Rate and Rhythm: Normal rate.  Pulmonary:     Effort: Pulmonary effort is normal. No respiratory distress.     Breath sounds: No stridor.  Abdominal:     General: Abdomen is flat. Bowel sounds are normal.     Palpations: Abdomen is soft. There is no hepatomegaly, splenomegaly or mass.     Tenderness: There is abdominal tenderness in the left lower quadrant. There is no guarding or rebound.     Hernia: No hernia is present.  Skin:    General: Skin is warm and dry.     Findings: No rash.  Neurological:     Mental Status: She is alert.     Motor: No abnormal muscle tone.     Coordination: Coordination normal.     Gait: Gait normal.   Psychiatric:        Mood and Affect: Mood normal.        Behavior: Behavior normal.      Results for orders placed or performed in visit on 01/06/24  POCT glycosylated hemoglobin (Hb A1C)   Collection Time: 01/06/24  8:36 AM  Result Value Ref Range   Hemoglobin A1C 6.7 (A) 4.0 - 5.6 %   HbA1c POC (<> result, manual entry)     HbA1c, POC (prediabetic range)     HbA1c, POC (controlled diabetic range)  Lipid panel   Collection Time: 01/06/24  9:30 AM  Result Value Ref Range   Cholesterol 211 (H) <200 mg/dL   HDL 48 (L) > OR = 50 mg/dL   Triglycerides 874 <849 mg/dL   LDL Cholesterol (Calc) 138 (H) mg/dL (calc)   Total CHOL/HDL Ratio 4.4 <5.0 (calc)   Non-HDL Cholesterol (Calc) 163 (H) <130 mg/dL (calc)  COMPLETE METABOLIC PANEL WITH GFR   Collection Time: 01/06/24  9:30 AM  Result Value Ref Range   Glucose, Bld 99 65 - 99 mg/dL   BUN 25 7 - 25 mg/dL   Creat 8.51 (H) 9.39 - 1.00 mg/dL   eGFR 36 (L) > OR = 60 mL/min/1.18m2   BUN/Creatinine Ratio 17 6 - 22 (calc)   Sodium 140 135 - 146 mmol/L   Potassium 4.3 3.5 - 5.3 mmol/L   Chloride 104 98 - 110 mmol/L   CO2 28 20 - 32 mmol/L   Calcium  9.9 8.6 - 10.4 mg/dL   Total Protein 7.6 6.1 - 8.1 g/dL   Albumin 4.2 3.6 - 5.1 g/dL   Globulin 3.4 1.9 - 3.7 g/dL (calc)   AG Ratio 1.2 1.0 - 2.5 (calc)   Total Bilirubin 0.4 0.2 - 1.2 mg/dL   Alkaline phosphatase (APISO) 47 37 - 153 U/L   AST 40 (H) 10 - 35 U/L   ALT 36 (H) 6 - 29 U/L       Assessment & Plan:     ICD-10-CM   1. Nausea  R11.0 Urine Culture    ondansetron  (ZOFRAN -ODT) 4 MG disintegrating tablet    famotidine  (PEPCID ) 20 MG tablet   improving, with eating, continue diet changes and can try pepcid  or zofran  prn    2. Abdominal cramping  R10.9 Urine Culture   was intense 1 week ago and now resolved, however LLQ abd with tender today on exam w/o guarding or rebound, r/o UTI, ddx includes diverticulitis/GI virus    3. Lower abdominal pain  R10.30 Urine Culture    cramping has improved, may have been GI virus? will r/o UTI today with urine dip and culture, pt improving no labs and imaging today, watchful waiting      Plan for now for pt to continue diet changes (she's eating light diet and symptoms have been improving for the past couple days) she can try pepcid  BID PRN for nausea or zofran  if that doesn't help.  Cramping had resolved - if it returns could consider bentyl .  Urine testing today, she's having normal formed BM that have not changed.   Currently with improving sx we discussed that labs and imaging may not be helpful or indicated right now but if sx do not resolve or if they worsen she was encouraged to f/up for recheck.  Return for As needed if not improving.     Michelene Cower, PA-C 05/06/24 9:52 AM

## 2024-05-08 LAB — URINE CULTURE
MICRO NUMBER:: 16653738
SPECIMEN QUALITY:: ADEQUATE

## 2024-05-11 ENCOUNTER — Ambulatory Visit: Payer: Self-pay | Admitting: Family Medicine

## 2024-05-11 MED ORDER — CEPHALEXIN 500 MG PO CAPS
500.0000 mg | ORAL_CAPSULE | Freq: Two times a day (BID) | ORAL | 0 refills | Status: AC
Start: 2024-05-11 — End: 2024-05-16

## 2024-05-13 ENCOUNTER — Ambulatory Visit: Admitting: Family Medicine

## 2024-06-02 ENCOUNTER — Other Ambulatory Visit: Payer: Self-pay | Admitting: Family Medicine

## 2024-06-02 DIAGNOSIS — R110 Nausea: Secondary | ICD-10-CM

## 2024-06-03 NOTE — Telephone Encounter (Signed)
 Requested medication (s) are due for refill today:   Requested medication (s) are on the active medication list: Yes  Last refill:  05/06/24  Future visit scheduled: Yes  Notes to clinic:  See pharmacy request    Requested Prescriptions  Pending Prescriptions Disp Refills   famotidine  (PEPCID ) 20 MG tablet [Pharmacy Med Name: FAMOTIDINE  20 MG TABLET] 180 tablet 1    Sig: Take 1 tablet (20 mg total) by mouth 2 (two) times daily as needed (nausea, upset stomach).     Gastroenterology:  H2 Antagonists Passed - 06/03/2024  2:02 PM      Passed - Valid encounter within last 12 months    Recent Outpatient Visits           4 weeks ago Nausea   [facility] Leavy Mole, PA-C   4 months ago Hyperglycemia   [facility] Glenard Mire, MD       Future Appointments             In 3 weeks Sowles, Krichna, MD [facility], [facility]

## 2024-06-15 DIAGNOSIS — N1832 Chronic kidney disease, stage 3b: Secondary | ICD-10-CM | POA: Diagnosis not present

## 2024-06-15 DIAGNOSIS — R809 Proteinuria, unspecified: Secondary | ICD-10-CM | POA: Diagnosis not present

## 2024-06-18 DIAGNOSIS — R809 Proteinuria, unspecified: Secondary | ICD-10-CM | POA: Diagnosis not present

## 2024-06-18 DIAGNOSIS — N184 Chronic kidney disease, stage 4 (severe): Secondary | ICD-10-CM | POA: Diagnosis not present

## 2024-06-18 DIAGNOSIS — I10 Essential (primary) hypertension: Secondary | ICD-10-CM | POA: Diagnosis not present

## 2024-06-18 DIAGNOSIS — D631 Anemia in chronic kidney disease: Secondary | ICD-10-CM | POA: Diagnosis not present

## 2024-06-18 DIAGNOSIS — N2581 Secondary hyperparathyroidism of renal origin: Secondary | ICD-10-CM | POA: Diagnosis not present

## 2024-06-24 ENCOUNTER — Encounter: Payer: Self-pay | Admitting: Family Medicine

## 2024-06-24 ENCOUNTER — Ambulatory Visit (INDEPENDENT_AMBULATORY_CARE_PROVIDER_SITE_OTHER): Admitting: Family Medicine

## 2024-06-24 VITALS — BP 118/70 | HR 71 | Resp 16 | Ht 65.0 in | Wt 158.7 lb

## 2024-06-24 DIAGNOSIS — E538 Deficiency of other specified B group vitamins: Secondary | ICD-10-CM | POA: Diagnosis not present

## 2024-06-24 DIAGNOSIS — E559 Vitamin D deficiency, unspecified: Secondary | ICD-10-CM | POA: Diagnosis not present

## 2024-06-24 DIAGNOSIS — E785 Hyperlipidemia, unspecified: Secondary | ICD-10-CM

## 2024-06-24 DIAGNOSIS — N1832 Chronic kidney disease, stage 3b: Secondary | ICD-10-CM

## 2024-06-24 DIAGNOSIS — Z87440 Personal history of urinary (tract) infections: Secondary | ICD-10-CM

## 2024-06-24 DIAGNOSIS — R739 Hyperglycemia, unspecified: Secondary | ICD-10-CM | POA: Diagnosis not present

## 2024-06-24 DIAGNOSIS — D472 Monoclonal gammopathy: Secondary | ICD-10-CM

## 2024-06-24 DIAGNOSIS — I700 Atherosclerosis of aorta: Secondary | ICD-10-CM

## 2024-06-24 DIAGNOSIS — N2581 Secondary hyperparathyroidism of renal origin: Secondary | ICD-10-CM | POA: Diagnosis not present

## 2024-06-24 DIAGNOSIS — D631 Anemia in chronic kidney disease: Secondary | ICD-10-CM

## 2024-06-24 DIAGNOSIS — I10 Essential (primary) hypertension: Secondary | ICD-10-CM

## 2024-06-24 LAB — POCT GLYCOSYLATED HEMOGLOBIN (HGB A1C): Hemoglobin A1C: 6.3 % — AB (ref 4.0–5.6)

## 2024-06-24 MED ORDER — EZETIMIBE 10 MG PO TABS: 10.0000 mg | ORAL_TABLET | Freq: Every day | ORAL | 1 refills | Status: AC

## 2024-06-24 MED ORDER — LOSARTAN POTASSIUM-HCTZ 100-12.5 MG PO TABS: 1.0000 | ORAL_TABLET | Freq: Every day | ORAL | 1 refills | Status: AC

## 2024-06-24 MED ORDER — ROSUVASTATIN CALCIUM 40 MG PO TABS: 40.0000 mg | ORAL_TABLET | Freq: Every day | ORAL | 1 refills | Status: AC

## 2024-06-24 NOTE — Progress Notes (Signed)
 Name: ADMIRE BUNNELL   MRN: [id number]    DOB: Mar 13, 1946   Date:06/24/2024       Progress Note  Subjective  Chief Complaint  Chief Complaint  Patient presents with   Medical Management of Chronic Issues    Nexlizet  too expensive pt continued doing ZETIA    Discussed the use of AI scribe software for clinical note transcription with the patient, who gave verbal consent to proceed.  History of Present Illness URIJAH RAYNOR is a 78 year old female with prediabetes and chronic kidney disease stage 3B who presents for follow-up.  Her A1c has decreased from 6.7% to 6.3%. She has made dietary modifications, including reducing dessert consumption and incorporating healthier options like chia seeds, sauerkraut, and [place] kielbasa into her meals. No symptoms of diabetes such as excessive hunger, thirst, or frequent urination, although she notes increased urination at night since a prolapse surgery performed last year.  She has chronic kidney disease stage 3B with an EGFR of 28 and a microalbumin ratio of 32. She continues to take losartan . No symptoms of kidney dysfunction such as itching. Her parathyroid  levels were slightly elevated at 70. Her hemoglobin level was 11.7.  Her hypertension is well controlled with losartan  HCTZ. No chest pain or palpitations.  She has atherosclerosis of the aorta and high cholesterol, for which she takes rosuvastatin  40 mg and ezetimibe . She previously tried Nexlizet  but discontinued it due to cost. Her LDL was 130, but she has no history of heart attack or stroke.  She has monoclonal gammopathy of undetermined significance and sees a hematologist annually. She was prescribed iron  capsules due to trending down ferritin levels.  She has glaucoma, diagnosed by her eye doctor at [facility], and uses prescribed eye drops. No changes in vision or need for new glasses over the past two years.  She had a urine culture in July but did not take antibiotics as the  symptoms resolved on her own. She drinks at least three 16-ounce bottles of water daily   She takes B12 and vitamin D  supplements due to previous deficiencies.    Patient Active Problem List   Diagnosis Date Noted   Anemia in stage 3b chronic kidney disease (HCC) 08/02/2022   Thrombocytopenia (HCC) 08/02/2022   Thrombocytopathia (HCC) 04/20/2022   History of IBS 06/03/2020   Mass of arm, left    MGUS (monoclonal gammopathy of unknown significance) 09/01/2018   Secondary hyperparathyroidism (HCC) 09/01/2018   B12 deficiency 08/19/2018   Atherosclerosis of aorta (HCC) 07/25/2018   Proteinuria 05/09/2018   Positive ANA (antinuclear antibody) 04/02/2018   Bilateral dry eyes 04/02/2018   History of hysterectomy 05/23/2015   Stage 3b chronic kidney disease (HCC) 05/23/2015   Essential hypertension 05/22/2015   Chronic constipation 05/22/2015   DDD (degenerative disc disease), lumbar 05/22/2015   DD (diverticular disease) 05/22/2015   Dyslipidemia 05/22/2015   History of cervical cancer 05/22/2015   Helicobacter pylori gastrointestinal tract infection 05/22/2015   Blood glucose elevated 05/22/2015   Overweight (BMI 25.0-29.9) 05/22/2015   Perennial allergic rhinitis with seasonal variation 05/22/2015   Vitamin D  deficiency 05/22/2015   Impingement syndrome of shoulder 01/14/2015    Past Surgical History:  Procedure Laterality Date   ABDOMINAL HYSTERECTOMY     ANTERIOR AND POSTERIOR REPAIR N/A 04/08/2023   Procedure: ANTERIOR (CYSTOCELE) AND POSTERIOR REPAIR (RECTOCELE);  Surgeon: Janit Alm Agent, MD;  Location: [facility] ORS;  Service: Gynecology;  Laterality: N/A;   BLADDER SUSPENSION Bilateral 04/08/2023   Procedure:  TOT;  Surgeon: Janit Alm Agent, MD;  Location: [facility];  Service: Gynecology;  Laterality: Bilateral;   BREAST BIOPSY Right 04/09/2006   negative   BREAST BIOPSY Left 12/04/2017   fibroadenmatoid change with coarse calcs   COLONOSCOPY WITH PROPOFOL  N/A 10/07/2018    Procedure: COLONOSCOPY WITH PROPOFOL ;  Surgeon: Therisa Bi, MD;  Location: [facility] ENDOSCOPY;  Service: Gastroenterology;  Laterality: N/A;   COLONOSCOPY WITH PROPOFOL  N/A 11/21/2021   Procedure: COLONOSCOPY WITH PROPOFOL ;  Surgeon: Therisa Bi, MD;  Location: [facility] ENDOSCOPY;  Service: Gastroenterology;  Laterality: N/A;   LIPOMA EXCISION Left 04/15/2020   Procedure: EXCISION LIPOMA, left arm;  Surgeon: Marolyn Nest, MD;  Location: [facility] ORS;  Service: General;  Laterality: Left;   XI ROBOTIC LAPAROSCOPIC ASSISTED APPENDECTOMY N/A 11/28/2021   Procedure: XI ROBOTIC LAPAROSCOPIC ASSISTED APPENDECTOMY;  Surgeon: Rodolph Romano, MD;  Location: [facility] ORS;  Service: General;  Laterality: N/A;    Family History  Problem Relation Age of Onset   Congestive Heart Failure Mother 65   Heart attack Father 62   Sinusitis Sister    Stomach cancer Sister    Uterine cancer Sister 76   Liver cancer Sister    Pancreatic cancer Sister    Dementia Sister        79   Heart attack Son    Breast cancer Neg Hx     Social History   Tobacco Use   Smoking status: Former    Current packs/day: 0.00    Average packs/day: 1 pack/day for 2.0 years (2.0 ttl pk-yrs)    Types: Cigarettes    Start date: 34    Quit date: 1963    Years since quitting: 62.6   Smokeless tobacco: Never   Tobacco comments:    smoking cessation materials not required  Substance Use Topics   Alcohol use: No    Alcohol/week: 0.0 standard drinks of alcohol     Current Outpatient Medications:    acetaminophen  (TYLENOL ) 500 MG tablet, Take 2 tablets (1,000 mg total) by mouth every 6 (six) hours as needed for mild pain or headache., Disp: 30 tablet, Rfl: 0   aspirin 81 MG tablet, Take 81 mg by mouth daily., Disp: , Rfl:    Biotin 5 MG CAPS, Take 5 mg by mouth daily., Disp: , Rfl:    brimonidine (ALPHAGAN) 0.2 % ophthalmic solution, Place 1 drop into both eyes in the morning and at bedtime., Disp: , Rfl:     carboxymethylcellulose (REFRESH PLUS) 0.5 % SOLN, Place 1 drop into both eyes daily., Disp: , Rfl:    cholecalciferol (VITAMIN D ) 1000 UNITS tablet, Take 1,000 Units by mouth daily. , Disp: , Rfl:    famotidine  (PEPCID ) 20 MG tablet, TAKE 1 TABLET (20 MG TOTAL) BY MOUTH 2 (TWO) TIMES DAILY AS NEEDED (NAUSEA, UPSET STOMACH)., Disp: 180 tablet, Rfl: 1   fexofenadine (ALLEGRA) 180 MG tablet, Take 180 mg by mouth every morning., Disp: , Rfl:    Iron -Vitamin C  65-125 MG TABS, Take 1 tablet by mouth daily., Disp: 90 tablet, Rfl: 2   losartan -hydrochlorothiazide  (HYZAAR) 100-12.5 MG tablet, Take 1 tablet by mouth daily., Disp: 90 tablet, Rfl: 1   ondansetron  (ZOFRAN -ODT) 4 MG disintegrating tablet, Take 1 tablet (4 mg total) by mouth every 8 (eight) hours as needed for nausea or vomiting., Disp: 10 tablet, Rfl: 0   rosuvastatin  (CRESTOR ) 40 MG tablet, Take 1 tablet (40 mg total) by mouth daily., Disp: 90 tablet, Rfl: 1   vitamin E 1000  UNIT capsule, Take 1,000 Units by mouth daily., Disp: , Rfl:    Bempedoic Acid-Ezetimibe  (NEXLIZET ) 180-10 MG TABS, Take 1 tablet by mouth at bedtime. In place of Zetia  (Patient not taking: Reported on 06/24/2024), Disp: 90 tablet, Rfl: 3  No Known Allergies  I personally reviewed active problem list, medication list, allergies with the patient/caregiver today.   ROS  Ten systems reviewed and is negative except as mentioned in HPI    Objective Physical Exam VITALS: BP- 118/70 CONSTITUTIONAL: Patient appears well-developed and well-nourished. No distress. HEENT: Head atraumatic, normocephalic, neck supple. CARDIOVASCULAR: Normal rate, regular rhythm and normal heart sounds. No murmur heard. No BLE edema. PULMONARY: Effort normal and breath sounds normal. Lungs clear to auscultation. No respiratory distress. ABDOMINAL: There is no tenderness or distention. MUSCULOSKELETAL: Normal gait. Without gross motor or sensory deficit. PSYCHIATRIC: Patient has a normal mood  and affect. Behavior is normal. Judgment and thought content normal.  Vitals:   06/24/24 1026  BP: 118/70  Pulse: 71  Resp: 16  SpO2: 96%  Weight: 158 lb 11.2 oz (72 kg)  Height: 5' 5 (1.651 m)    Body mass index is 26.41 kg/m.  Recent Results (from the past 2160 hours)  POCT Urinalysis Dipstick     Status: Abnormal   Collection Time: 05/06/24 10:18 AM  Result Value Ref Range   Color, UA Yellow    Clarity, UA Clear    Glucose, UA Negative Negative   Bilirubin, UA Negative    Ketones, UA Negative    Spec Grav, UA 1.010 1.010 - 1.025   Blood, UA Negative    pH, UA 6.0 5.0 - 8.0   Protein, UA Negative Negative   Urobilinogen, UA 0.2 0.2 or 1.0 E.U./dL   Nitrite, UA Trace (A)    Leukocytes, UA Negative Negative   Appearance Normal    Odor None   Urine Culture     Status: Abnormal   Collection Time: 05/06/24 10:55 AM   Specimen: Urine  Result Value Ref Range   MICRO NUMBER: [id number]    SPECIMEN QUALITY: Adequate    Sample Source URINE    STATUS: FINAL    ISOLATE 1: Klebsiella pneumoniae (A)     Comment: 50,000-100,000 CFU/mL of Klebsiella pneumoniae      Susceptibility   Klebsiella pneumoniae - URINE CULTURE, REFLEX    AMOX/CLAVULANIC <=2 Sensitive     AMPICILLIN/SULBACTAM 4 Sensitive     CEFAZOLIN * <=4 Not Reportable      * For infections other than uncomplicated UTI caused by E. coli, K. pneumoniae or P. mirabilis: Cefazolin  is resistant if MIC > or = 8 mcg/mL. (Distinguishing susceptible versus intermediate for isolates with MIC < or = 4 mcg/mL requires additional testing.) For uncomplicated UTI caused by E. coli, K. pneumoniae or P. mirabilis: Cefazolin  is susceptible if MIC <32 mcg/mL and predicts susceptible to the oral agents cefaclor, cefdinir, cefpodoxime, cefprozil, cefuroxime, cephalexin  and loracarbef.     CEFTAZIDIME <=1 Sensitive     CEFEPIME <=0.12 Sensitive     CEFTRIAXONE <=0.25 Sensitive     CIPROFLOXACIN <=0.06 Sensitive     LEVOFLOXACIN  <=0.12 Sensitive     GENTAMICIN <=1 Sensitive     IMIPENEM <=0.25 Sensitive     MEROPENEM <=0.25 Sensitive     NITROFURANTOIN  64 Intermediate     PIP/TAZO <=4 Sensitive     TRIMETH/SULFA* <=20 Sensitive      * For infections other than uncomplicated UTI caused by E. coli, K. pneumoniae or  P. mirabilis: Cefazolin  is resistant if MIC > or = 8 mcg/mL. (Distinguishing susceptible versus intermediate for isolates with MIC < or = 4 mcg/mL requires additional testing.) For uncomplicated UTI caused by E. coli, K. pneumoniae or P. mirabilis: Cefazolin  is susceptible if MIC <32 mcg/mL and predicts susceptible to the oral agents cefaclor, cefdinir, cefpodoxime, cefprozil, cefuroxime, cephalexin  and loracarbef. Legend: S = Susceptible  I = Intermediate R = Resistant  NS = Not susceptible SDD = Susceptible Dose Dependent * = Not Tested  NR = Not Reported **NN = See Therapy Comments     Diabetic Foot Exam:     PHQ2/9:    06/24/2024   10:15 AM 01/09/2024    9:33 AM 01/06/2024    8:27 AM 06/21/2023    9:49 AM 02/18/2023    9:32 AM  Depression screen PHQ 2/9  Decreased Interest 0 0 0 0 0  Down, Depressed, Hopeless 0 0 0 0 0  PHQ - 2 Score 0 0 0 0 0  Altered sleeping  0 0 0 0  Tired, decreased energy  0 0 0 0  Change in appetite  0 0 0 0  Feeling bad or failure about yourself   0 0 0 0  Trouble concentrating  0 0 0 0  Moving slowly or fidgety/restless  0 0 0 0  Suicidal thoughts  0 0 0 0  PHQ-9 Score  0 0 0 0  Difficult doing work/chores  Not difficult at all Not difficult at all      phq 9 is negative  Fall Risk:    06/24/2024   10:15 AM 01/09/2024    9:36 AM 01/08/2024    2:25 PM 01/06/2024    8:19 AM 06/21/2023    9:49 AM  Fall Risk   Falls in the past year? 0 0 0 0 0  Number falls in past yr: 0 0  0 0  Injury with Fall? 0 0 0 0 0  Risk for fall due to : No Fall Risks No Fall Risks  No Fall Risks No Fall Risks  Follow up Falls evaluation completed Falls prevention  discussed;Falls evaluation completed  Falls prevention discussed;Education provided;Falls evaluation completed Falls prevention discussed    Assessment & Plan Prediabetes/hyperglycemia  A1c decreased from 6.7% to 6.3%, indicating improvement. - Continue current dietary modifications to further reduce A1c. - Monitor A1c levels regularly.  Chronic kidney disease stage 3b with anemia and secondary hyperparathyroidism CKD stage 3b with eGFR of 28, microalbumin ratio 32. Anemia with hemoglobin 11.7. Secondary hyperparathyroidism with parathyroid  hormone level 70. - Continue losartan  for kidney protection and proteinuria suppression. - Ensure adequate hydration. - Monitor parathyroid  hormone levels and kidney function regularly. - Check urine culture to rule out untreated infection.  Hypertension associated with CKI stage 3b Blood pressure controlled at 118/70 mmHg. No side effects from current regimen. - Continue losartan  HCTZ. - Refill prescription for losartan  HCTZ.  Atherosclerosis of aorta and hyperlipidemia Atherosclerosis with hyperlipidemia. LDL previously 130s. On rosuvastatin  and ezetimibe . Nexlizet  discontinued due to cost. - Continue rosuvastatin  40 mg and ezetimibe . - Refill prescription for rosuvastatin  and ezetimibe .  Monoclonal gammopathy of undetermined significance (MGUS) MGUS monitored by hematologist. Ferritin levels trending down, leading to iron  supplementation. - Continue follow-up with hematologist. - Monitor iron  levels and adjust supplementation as needed.  Glaucoma Recent eye pressure 11. No changes in vision. - Continue current ophthalmologic care and follow-up.  Vitamin B12 and vitamin D  deficiency Previously low levels, currently taking supplements. -  Continue vitamin B12 and vitamin D  supplementation.  Iron  deficiency (under evaluation/treatment) Iron  deficiency under evaluation with hematologist. Ferritin levels trending down, leading to iron   supplementation. - Continue iron  supplementation as prescribed by hematologist.

## 2024-06-27 LAB — CULTURE, URINE COMPREHENSIVE
MICRO NUMBER:: 16859430
SPECIMEN QUALITY:: ADEQUATE

## 2024-06-29 ENCOUNTER — Ambulatory Visit: Payer: Self-pay | Admitting: Family Medicine

## 2024-07-22 ENCOUNTER — Other Ambulatory Visit: Payer: Self-pay | Admitting: Internal Medicine

## 2024-07-22 DIAGNOSIS — E782 Mixed hyperlipidemia: Secondary | ICD-10-CM | POA: Diagnosis not present

## 2024-07-22 DIAGNOSIS — E669 Obesity, unspecified: Secondary | ICD-10-CM | POA: Diagnosis not present

## 2024-07-22 DIAGNOSIS — E785 Hyperlipidemia, unspecified: Secondary | ICD-10-CM

## 2024-07-22 DIAGNOSIS — J329 Chronic sinusitis, unspecified: Secondary | ICD-10-CM | POA: Diagnosis not present

## 2024-07-22 DIAGNOSIS — R42 Dizziness and giddiness: Secondary | ICD-10-CM | POA: Diagnosis not present

## 2024-07-22 DIAGNOSIS — R002 Palpitations: Secondary | ICD-10-CM | POA: Diagnosis not present

## 2024-07-22 DIAGNOSIS — R9431 Abnormal electrocardiogram [ECG] [EKG]: Secondary | ICD-10-CM | POA: Diagnosis not present

## 2024-07-22 DIAGNOSIS — N183 Chronic kidney disease, stage 3 unspecified: Secondary | ICD-10-CM | POA: Diagnosis not present

## 2024-07-22 DIAGNOSIS — I10 Essential (primary) hypertension: Secondary | ICD-10-CM | POA: Diagnosis not present

## 2024-07-29 ENCOUNTER — Ambulatory Visit
Admission: RE | Admit: 2024-07-29 | Discharge: 2024-07-29 | Disposition: A | Discharge status: Home / Self Care | Payer: Self-pay | Source: Ambulatory Visit | Attending: Internal Medicine | Admitting: Internal Medicine

## 2024-07-29 DIAGNOSIS — E785 Hyperlipidemia, unspecified: Secondary | ICD-10-CM | POA: Insufficient documentation

## 2024-07-30 ENCOUNTER — Inpatient Hospital Stay: Payer: Medicare PPO | Attending: Oncology

## 2024-07-30 DIAGNOSIS — D696 Thrombocytopenia, unspecified: Secondary | ICD-10-CM | POA: Insufficient documentation

## 2024-07-30 DIAGNOSIS — Z87891 Personal history of nicotine dependence: Secondary | ICD-10-CM | POA: Insufficient documentation

## 2024-07-30 DIAGNOSIS — I129 Hypertensive chronic kidney disease with stage 1 through stage 4 chronic kidney disease, or unspecified chronic kidney disease: Secondary | ICD-10-CM | POA: Insufficient documentation

## 2024-07-30 DIAGNOSIS — D631 Anemia in chronic kidney disease: Secondary | ICD-10-CM | POA: Insufficient documentation

## 2024-07-30 DIAGNOSIS — Z8049 Family history of malignant neoplasm of other genital organs: Secondary | ICD-10-CM | POA: Diagnosis not present

## 2024-07-30 DIAGNOSIS — R5383 Other fatigue: Secondary | ICD-10-CM | POA: Insufficient documentation

## 2024-07-30 DIAGNOSIS — N1832 Chronic kidney disease, stage 3b: Secondary | ICD-10-CM | POA: Insufficient documentation

## 2024-07-30 DIAGNOSIS — E538 Deficiency of other specified B group vitamins: Secondary | ICD-10-CM | POA: Diagnosis not present

## 2024-07-30 DIAGNOSIS — Z800 Family history of malignant neoplasm of digestive organs: Secondary | ICD-10-CM | POA: Diagnosis not present

## 2024-07-30 LAB — IRON AND TIBC
Iron: 97 ug/dL (ref 28–170)
Saturation Ratios: 32 % — ABNORMAL HIGH (ref 10.4–31.8)
TIBC: 308 ug/dL (ref 250–450)
UIBC: 211 ug/dL

## 2024-07-30 LAB — CBC WITH DIFFERENTIAL (CANCER CENTER ONLY)
Abs Immature Granulocytes: 0.01 K/uL (ref 0.00–0.07)
Basophils Absolute: 0 K/uL (ref 0.0–0.1)
Basophils Relative: 1 %
Eosinophils Absolute: 0.1 K/uL (ref 0.0–0.5)
Eosinophils Relative: 1 %
HCT: 34.9 % — ABNORMAL LOW (ref 36.0–46.0)
Hemoglobin: 11.6 g/dL — ABNORMAL LOW (ref 12.0–15.0)
Immature Granulocytes: 0 %
Lymphocytes Relative: 39 %
Lymphs Abs: 2.5 K/uL (ref 0.7–4.0)
MCH: 31.3 pg (ref 26.0–34.0)
MCHC: 33.2 g/dL (ref 30.0–36.0)
MCV: 94.1 fL (ref 80.0–100.0)
Monocytes Absolute: 0.7 K/uL (ref 0.1–1.0)
Monocytes Relative: 10 %
Neutro Abs: 3.1 K/uL (ref 1.7–7.7)
Neutrophils Relative %: 49 %
Platelet Count: 122 K/uL — ABNORMAL LOW (ref 150–400)
RBC: 3.71 MIL/uL — ABNORMAL LOW (ref 3.87–5.11)
RDW: 13.5 % (ref 11.5–15.5)
WBC Count: 6.3 K/uL (ref 4.0–10.5)
nRBC: 0 % (ref 0.0–0.2)

## 2024-07-30 LAB — RETIC PANEL
Immature Retic Fract: 10.9 % (ref 2.3–15.9)
RBC.: 3.74 MIL/uL — ABNORMAL LOW (ref 3.87–5.11)
Retic Count, Absolute: 42.3 K/uL (ref 19.0–186.0)
Retic Ct Pct: 1.1 % (ref 0.4–3.1)
Reticulocyte Hemoglobin: 35.6 pg (ref >27.9)

## 2024-07-30 LAB — VITAMIN B12: Vitamin B-12: 1058 pg/mL — ABNORMAL HIGH (ref 180–914)

## 2024-07-30 LAB — FERRITIN: Ferritin: 43 ng/mL (ref 11–307)

## 2024-08-03 ENCOUNTER — Encounter: Payer: Self-pay | Admitting: Oncology

## 2024-08-03 ENCOUNTER — Inpatient Hospital Stay: Payer: Medicare PPO | Admitting: Oncology

## 2024-08-03 ENCOUNTER — Inpatient Hospital Stay: Payer: Medicare PPO

## 2024-08-03 VITALS — BP 138/78 | HR 54 | Temp 96.0°F | Resp 18 | Wt 158.2 lb

## 2024-08-03 DIAGNOSIS — D696 Thrombocytopenia, unspecified: Secondary | ICD-10-CM

## 2024-08-03 DIAGNOSIS — E538 Deficiency of other specified B group vitamins: Secondary | ICD-10-CM | POA: Diagnosis not present

## 2024-08-03 DIAGNOSIS — D631 Anemia in chronic kidney disease: Secondary | ICD-10-CM

## 2024-08-03 DIAGNOSIS — N1832 Chronic kidney disease, stage 3b: Secondary | ICD-10-CM

## 2024-08-03 DIAGNOSIS — Z8049 Family history of malignant neoplasm of other genital organs: Secondary | ICD-10-CM | POA: Diagnosis not present

## 2024-08-03 DIAGNOSIS — R5383 Other fatigue: Secondary | ICD-10-CM | POA: Diagnosis not present

## 2024-08-03 DIAGNOSIS — Z800 Family history of malignant neoplasm of digestive organs: Secondary | ICD-10-CM | POA: Diagnosis not present

## 2024-08-03 DIAGNOSIS — Z87891 Personal history of nicotine dependence: Secondary | ICD-10-CM | POA: Diagnosis not present

## 2024-08-03 DIAGNOSIS — I129 Hypertensive chronic kidney disease with stage 1 through stage 4 chronic kidney disease, or unspecified chronic kidney disease: Secondary | ICD-10-CM | POA: Diagnosis not present

## 2024-08-03 NOTE — Progress Notes (Signed)
 Per MD check out:  No B12 injection.

## 2024-08-03 NOTE — Progress Notes (Signed)
 Hematology/Oncology Progress note [phone] [phone]      Patient Care Team: Sowles, Krichna, MD as PCP - General (Family Medicine) Babara Call, MD as Consulting Physician (Oncology) Lateef, Munsoor, MD (Nephrology) Florencio Cara BIRCH, MD as Consulting Physician (Cardiology) Therisa Bi, MD as Consulting Physician (Gastroenterology) Mittie Gaskin, MD as Referring Physician (Ophthalmology)  ASSESSMENT & PLAN:   Anemia in stage 3b chronic kidney disease [facility]) Labs are reviewed and discussed with patient. Lab Results  Component Value Date   HGB 11.6 (L) 07/30/2024   TIBC 308 07/30/2024   IRONPCTSAT 32 (H) 07/30/2024   FERRITIN 43 07/30/2024    Patient is not interested in IV Venofer treatment. Continue Vitron C 1 tablet every other day.   B12 deficiency Antiparietal and intrinsic factor antibodies are both negative. B12 level is high.  Previously on vitamin B12 injection every 3 months.  Hold off B12 injection today.  Recommend empiric B12 injection 6 months.  Follow-up in a year..    Thrombocytopenia platelet counts are stable.  Patient has had previous work-up done including negative hepatitis, HIV.  Monitor counts.    Orders Placed This Encounter  Procedures   CBC with Differential (Cancer Center Only)    Standing Status:   Future    Expected Date:   08/03/2025    Expiration Date:   11/01/2025   Iron  and TIBC    Standing Status:   Future    Expected Date:   08/03/2025    Expiration Date:   11/01/2025   Ferritin    Standing Status:   Future    Expected Date:   08/03/2025    Expiration Date:   11/01/2025   Vitamin B12    Standing Status:   Future    Expected Date:   08/03/2025    Expiration Date:   11/01/2025   Follow up  6 months B12 injection 1 year lab prior to MD cbc cmp b12, iron  tibc ferritin   All questions were answered. The patient knows to call the clinic with any problems, questions or concerns.  Call Babara, MD, PhD [facility]  Health Hematology Oncology 08/03/2024   REASON FOR VISIT Follow up for management of anemia and chronic kidney disease.  Thrombocytopenia, B12 deficiency.  HISTORY OF PRESENTING ILLNESS:  Morgan Livingston is a  78 y.o.  female with PMH listed below who was referred to me for evaluation of abnormal UPEP.   She has CKD and follows up with Dr.Lateef. History of HTN. Reports doing well, denies bone pain.  Fatigue:  Chronic onset, perisistent, no aggravating or improving factors, no associated symptoms.  Reviewed labs which was done at [facility] office.  03/05/2018 SPEP done at [facility] showed negative for M Spike UPEP: M spike 16.4  # colonoscopy done on 12 02/05/2016 with findings of polyps which were resected and retrieved.  Pathology showed negative for high-grade dysplasia or malignancy.  + Tubular adenoma   INTERVAL HISTORY Morgan Livingston is a 78 y.o. female who has above history reviewed by me today presents for follow up visit for management of anemia and chronic kidney disease, thrombocytopenia and vitamin B12 deficiency,  Patient has no new complaints today. She takes oral iron  supplementation daily.     Review of Systems  Constitutional:  Positive for malaise/fatigue. Negative for chills, fever and weight loss.  HENT:  Negative for sore throat.   Eyes:  Negative for redness.  Respiratory:  Negative for cough, shortness of breath and wheezing.  Cardiovascular:  Negative for chest pain, palpitations and leg swelling.  Gastrointestinal:  Negative for abdominal pain, blood in stool, heartburn, nausea and vomiting.  Genitourinary:  Negative for dysuria.  Musculoskeletal:  Negative for myalgias.  Skin:  Negative for rash.  Neurological:  Negative for dizziness, tingling and tremors. Seizures: . Endo/Heme/Allergies:  Does not bruise/bleed easily.  Psychiatric/Behavioral:  Negative for hallucinations.     MEDICAL HISTORY:  Past Medical History:  Diagnosis  Date   Allergy    Anemia    Aortic atherosclerosis    Cancer (HCC)    Uterine Cancer   CKD (chronic kidney disease), stage III (HCC)    DDD (degenerative disc disease), lumbar    GERD (gastroesophageal reflux disease)    h/o   H. pylori infection    History of IBS    Hyperlipidemia    Hypertension    Positive ANA (antinuclear antibody)    Secondary hyperparathyroidism    Thrombocytopenia     SURGICAL HISTORY: Past Surgical History:  Procedure Laterality Date   ABDOMINAL HYSTERECTOMY     ANTERIOR AND POSTERIOR REPAIR N/A 04/08/2023   Procedure: ANTERIOR (CYSTOCELE) AND POSTERIOR REPAIR (RECTOCELE);  Surgeon: Janit Alm Agent, MD;  Location: [facility] ORS;  Service: Gynecology;  Laterality: N/A;   BLADDER SUSPENSION Bilateral 04/08/2023   Procedure: TOT;  Surgeon: Janit Alm Agent, MD;  Location: [facility] ORS;  Service: Gynecology;  Laterality: Bilateral;   BREAST BIOPSY Right 04/09/2006   negative   BREAST BIOPSY Left 12/04/2017   fibroadenmatoid change with coarse calcs   COLONOSCOPY WITH PROPOFOL  N/A 10/07/2018   Procedure: COLONOSCOPY WITH PROPOFOL ;  Surgeon: Therisa Bi, MD;  Location: [facility] ENDOSCOPY;  Service: Gastroenterology;  Laterality: N/A;   COLONOSCOPY WITH PROPOFOL  N/A 11/21/2021   Procedure: COLONOSCOPY WITH PROPOFOL ;  Surgeon: Therisa Bi, MD;  Location: [facility] ENDOSCOPY;  Service: Gastroenterology;  Laterality: N/A;   LIPOMA EXCISION Left 04/15/2020   Procedure: EXCISION LIPOMA, left arm;  Surgeon: Marolyn Nest, MD;  Location: [facility] ORS;  Service: General;  Laterality: Left;   XI ROBOTIC LAPAROSCOPIC ASSISTED APPENDECTOMY N/A 11/28/2021   Procedure: XI ROBOTIC LAPAROSCOPIC ASSISTED APPENDECTOMY;  Surgeon: Rodolph Romano, MD;  Location: [facility] ORS;  Service: General;  Laterality: N/A;    SOCIAL HISTORY: Social History   Socioeconomic History   Marital status: Married    Spouse name: Junior   Number of children: 2   Years of education: some college   Highest  education level: Some college, no degree  Occupational History   Occupation: Scientist, 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   Tobacco Use   Smoking status: Former    Current packs/day: 0.00    Average packs/day: 1 pack/day for 2.0 years (2.0 ttl pk-yrs)    Types: Cigarettes    Start date: 1961    Quit date: 1963    Years since quitting: 62.7   Smokeless tobacco: Never   Tobacco comments:    smoking cessation materials not required  Vaping Use   Vaping status: Never Used  Substance and Sexual Activity   Alcohol use: No    Alcohol/week: 0.0 standard drinks of alcohol   Drug use: No   Sexual activity: Not Currently    Partners: Male    Birth control/protection: Surgical  Other Topics Concern   Not on file  Social History Narrative   Married for 50 years    Social Drivers of Health   Financial Resource Strain: Low Risk  (05/05/2024)   Overall Financial Resource Strain (CARDIA)  Difficulty of Paying Living Expenses: Not hard at all  Food Insecurity: No Food Insecurity (05/05/2024)   Hunger Vital Sign    Worried About Running Out of Food in the Last Year: Never true    Ran Out of Food in the Last Year: Never true  Transportation Needs: No Transportation Needs (05/05/2024)   PRAPARE - Administrator, Civil Service (Medical): No    Lack of Transportation (Non-Medical): No  Physical Activity: Insufficiently Active (05/05/2024)   Exercise Vital Sign    Days of Exercise per Week: 3 days    Minutes of Exercise per Session: 30 min  Stress: No Stress Concern Present (05/05/2024)   [employer] of Occupational Health - Occupational Stress Questionnaire    Feeling of Stress: Only a little  Social Connections: Socially Integrated (05/05/2024)   Social Connection and Isolation Panel    Frequency of Communication with Friends and Family: More than three times a week    Frequency of Social Gatherings with Friends and Family: More than three times a week    Attends Religious Services:  More than 4 times per year    Active Member of [employer] or Organizations: Yes    Attends Engineer, structural: More than 4 times per year    Marital Status: Married  Catering manager Violence: Not At Risk (01/09/2024)   Humiliation, Afraid, Rape, and Kick questionnaire    Fear of Current or Ex-Partner: No    Emotionally Abused: No    Physically Abused: No    Sexually Abused: No    FAMILY HISTORY: Family History  Problem Relation Age of Onset   Congestive Heart Failure Mother 103   Heart attack Father 81   Sinusitis Sister    Stomach cancer Sister    Uterine cancer Sister 76   Liver cancer Sister    Pancreatic cancer Sister    Dementia Sister        50   Heart attack Son    Breast cancer Neg Hx     ALLERGIES:  has no known allergies.  MEDICATIONS:  Current Outpatient Medications  Medication Sig Dispense Refill   acetaminophen  (TYLENOL ) 500 MG tablet Take 2 tablets (1,000 mg total) by mouth every 6 (six) hours as needed for mild pain or headache. 30 tablet 0   aspirin 81 MG tablet Take 81 mg by mouth daily.     Biotin 5 MG CAPS Take 5 mg by mouth daily.     brimonidine (ALPHAGAN) 0.2 % ophthalmic solution Place 1 drop into both eyes in the morning and at bedtime.     carboxymethylcellulose (REFRESH PLUS) 0.5 % SOLN Place 1 drop into both eyes daily.     cholecalciferol (VITAMIN D ) 1000 UNITS tablet Take 1,000 Units by mouth daily.      ezetimibe  (ZETIA ) 10 MG tablet Take 1 tablet (10 mg total) by mouth daily. 90 tablet 1   fexofenadine (ALLEGRA) 180 MG tablet Take 180 mg by mouth every morning.     Iron -Vitamin C  65-125 MG TABS Take 1 tablet by mouth daily. 90 tablet 2   losartan -hydrochlorothiazide  (HYZAAR) 100-12.5 MG tablet Take 1 tablet by mouth daily. 90 tablet 1   rosuvastatin  (CRESTOR ) 40 MG tablet Take 1 tablet (40 mg total) by mouth daily. 90 tablet 1   vitamin E 1000 UNIT capsule Take 1,000 Units by mouth daily.     No current facility-administered  medications for this visit.     PHYSICAL EXAMINATION: ECOG PERFORMANCE STATUS:  1 - Symptomatic but completely ambulatory Vitals:   08/03/24 1000  BP: 138/78  Pulse: (!) 54  Resp: 18  Temp: (!) 96 F (35.6 C)  SpO2: 99%   Filed Weights   08/03/24 1000  Weight: 158 lb 3.2 oz (71.8 kg)    Physical Exam Constitutional:      General: She is not in acute distress. HENT:     Head: Normocephalic and atraumatic.  Eyes:     General: No scleral icterus.    Conjunctiva/sclera: Conjunctivae normal.     Pupils: Pupils are equal, round, and reactive to light.  Cardiovascular:     Rate and Rhythm: Normal rate and regular rhythm.     Heart sounds: Normal heart sounds.  Pulmonary:     Effort: Pulmonary effort is normal. No respiratory distress.     Breath sounds: Normal breath sounds. No wheezing or rales.  Chest:     Chest wall: No tenderness.  Abdominal:     General: Bowel sounds are normal. There is no distension.     Palpations: Abdomen is soft. There is no mass.     Tenderness: There is no abdominal tenderness.  Musculoskeletal:        General: No deformity. Normal range of motion.     Cervical back: Normal range of motion and neck supple.  Lymphadenopathy:     Cervical: No cervical adenopathy.  Skin:    General: Skin is warm and dry.     Findings: No erythema or rash.  Neurological:     Mental Status: She is alert and oriented to person, place, and time.     Cranial Nerves: No cranial nerve deficit.     Coordination: Coordination normal.  Psychiatric:        Behavior: Behavior normal.        Thought Content: Thought content normal.      LABORATORY DATA:  I have reviewed the data as listed Lab Results  Component Value Date   WBC 6.3 07/30/2024   HGB 11.6 (L) 07/30/2024   HCT 34.9 (L) 07/30/2024   MCV 94.1 07/30/2024   PLT 122 (L) 07/30/2024   Recent Labs    01/06/24 0930  NA 140  K 4.3  CL 104  CO2 28  GLUCOSE 99  BUN 25  CREATININE 1.48*  CALCIUM   9.9  PROT 7.6  AST 40*  ALT 36*  BILITOT 0.4   Iron /TIBC/Ferritin/ %Sat    Component Value Date/Time   IRON  97 07/30/2024 0949   IRON  76 05/23/2015 0914   TIBC 308 07/30/2024 0949   TIBC 276 05/23/2015 0914   FERRITIN 43 07/30/2024 0949   FERRITIN 57 05/23/2015 0914   IRONPCTSAT 32 (H) 07/30/2024 0949   IRONPCTSAT 28 05/23/2015 0914    04/17/2018  SPEP no M spike. Normal serum free light chain ratio. UPEP, no M spike, elevated free kappa/lamda ratio at 20.75.   08/12/2018 UPEP no M spike

## 2024-08-03 NOTE — Assessment & Plan Note (Addendum)
 Labs are reviewed and discussed with patient. Lab Results  Component Value Date   HGB 11.6 (L) 07/30/2024   TIBC 308 07/30/2024   IRONPCTSAT 32 (H) 07/30/2024   FERRITIN 43 07/30/2024    Patient is not interested in IV Venofer treatment. Continue Vitron C 1 tablet every other day.

## 2024-08-03 NOTE — Assessment & Plan Note (Addendum)
 Antiparietal and intrinsic factor antibodies are both negative. B12 level is high.  Previously on vitamin B12 injection every 3 months.  Hold off B12 injection today.  Recommend empiric B12 injection 6 months.  Follow-up in a year.SABRA

## 2024-08-03 NOTE — Assessment & Plan Note (Signed)
platelet counts are stable.  Patient has had previous work-up done including negative hepatitis, HIV.  Monitor counts.

## 2024-08-10 ENCOUNTER — Ambulatory Visit (INDEPENDENT_AMBULATORY_CARE_PROVIDER_SITE_OTHER)

## 2024-08-10 DIAGNOSIS — Z23 Encounter for immunization: Secondary | ICD-10-CM

## 2024-08-10 NOTE — Progress Notes (Signed)
 Patient is in office today for a nurse visit for Flu Immunization. Patient Injection was given in the  Right deltoid. Patient tolerated injection well.

## 2024-08-12 ENCOUNTER — Other Ambulatory Visit: Payer: Self-pay | Admitting: Internal Medicine

## 2024-08-12 DIAGNOSIS — R931 Abnormal findings on diagnostic imaging of heart and coronary circulation: Secondary | ICD-10-CM

## 2024-08-28 ENCOUNTER — Telehealth (HOSPITAL_COMMUNITY): Payer: Self-pay | Admitting: Emergency Medicine

## 2024-08-28 NOTE — Telephone Encounter (Signed)
 Attempted to call patient regarding upcoming cardiac CT appointment. Left message on voicemail with name and callback number Rockwell Alexandria RN Navigator Cardiac Imaging [facility] [phone] Office [phone] Cell

## 2024-08-28 NOTE — Telephone Encounter (Signed)
 Reaching out to patient to offer assistance regarding upcoming cardiac imaging study; pt verbalizes understanding of appt date/time, parking situation and where to check in, pre-test NPO status and medications ordered, and verified current allergies; name and call back number provided for further questions should they arise Rockwell Alexandria RN Navigator Cardiac Imaging [facility] [phone] office [phone] cell

## 2024-08-31 ENCOUNTER — Other Ambulatory Visit: Payer: Self-pay | Admitting: Cardiology

## 2024-08-31 ENCOUNTER — Ambulatory Visit
Admission: RE | Admit: 2024-08-31 | Discharge: 2024-08-31 | Disposition: A | Discharge status: Home / Self Care | Source: Ambulatory Visit | Attending: Cardiology | Admitting: Cardiology

## 2024-08-31 ENCOUNTER — Ambulatory Visit
Admission: RE | Admit: 2024-08-31 | Discharge: 2024-08-31 | Disposition: A | Discharge status: Home / Self Care | Source: Ambulatory Visit | Attending: Internal Medicine | Admitting: Internal Medicine

## 2024-08-31 DIAGNOSIS — I25118 Atherosclerotic heart disease of native coronary artery with other forms of angina pectoris: Secondary | ICD-10-CM | POA: Diagnosis not present

## 2024-08-31 DIAGNOSIS — R931 Abnormal findings on diagnostic imaging of heart and coronary circulation: Secondary | ICD-10-CM | POA: Insufficient documentation

## 2024-08-31 DIAGNOSIS — R9431 Abnormal electrocardiogram [ECG] [EKG]: Secondary | ICD-10-CM | POA: Diagnosis not present

## 2024-08-31 MED ORDER — NITROGLYCERIN 0.4 MG SL SUBL
0.8000 mg | SUBLINGUAL_TABLET | Freq: Once | SUBLINGUAL | Status: AC
Start: 2024-08-31 — End: 2024-08-31
  Administered 2024-08-31: 0.8 mg via SUBLINGUAL
  Filled 2024-08-31: qty 25

## 2024-08-31 MED ORDER — IOHEXOL 350 MG/ML SOLN
100.0000 mL | Freq: Once | INTRAVENOUS | Status: AC | PRN
  Administered 2024-08-31: 100 mL via INTRAVENOUS

## 2024-08-31 NOTE — Progress Notes (Signed)
 Pt tolerated. Scan well. Pt up and walking without difficulty. Pt alert and oriented and educated to stay hydrated today

## 2024-10-23 ENCOUNTER — Encounter: Payer: Self-pay | Admitting: Oncology

## 2024-11-03 ENCOUNTER — Other Ambulatory Visit (HOSPITAL_COMMUNITY): Payer: Self-pay

## 2024-11-03 ENCOUNTER — Encounter: Payer: Self-pay | Admitting: Oncology

## 2024-12-01 ENCOUNTER — Other Ambulatory Visit: Payer: Self-pay | Admitting: Family Medicine

## 2024-12-01 DIAGNOSIS — Z1231 Encounter for screening mammogram for malignant neoplasm of breast: Secondary | ICD-10-CM

## 2024-12-24 ENCOUNTER — Ambulatory Visit: Admitting: Family Medicine

## 2025-01-14 ENCOUNTER — Ambulatory Visit

## 2025-01-19 ENCOUNTER — Encounter

## 2025-02-01 ENCOUNTER — Ambulatory Visit

## 2025-07-29 ENCOUNTER — Other Ambulatory Visit

## 2025-08-03 ENCOUNTER — Ambulatory Visit

## 2025-08-03 ENCOUNTER — Ambulatory Visit: Admitting: Oncology
